# Patient Record
Sex: Male | Born: 1935 | Race: White | Hispanic: No | Marital: Married | Smoking: Never smoker
Health system: Southern US, Community
[De-identification: ages and names within clinical notes are randomized; demographics above are authoritative.]

## PROBLEM LIST (undated history)

## (undated) DIAGNOSIS — I4891 Unspecified atrial fibrillation: Secondary | ICD-10-CM

## (undated) DIAGNOSIS — E039 Hypothyroidism, unspecified: Secondary | ICD-10-CM

## (undated) DIAGNOSIS — N184 Chronic kidney disease, stage 4 (severe): Secondary | ICD-10-CM

## (undated) DIAGNOSIS — I5042 Chronic combined systolic (congestive) and diastolic (congestive) heart failure: Secondary | ICD-10-CM

## (undated) DIAGNOSIS — K219 Gastro-esophageal reflux disease without esophagitis: Secondary | ICD-10-CM

## (undated) DIAGNOSIS — I639 Cerebral infarction, unspecified: Secondary | ICD-10-CM

## (undated) DIAGNOSIS — I255 Ischemic cardiomyopathy: Secondary | ICD-10-CM

## (undated) DIAGNOSIS — C4490 Unspecified malignant neoplasm of skin, unspecified: Secondary | ICD-10-CM

## (undated) DIAGNOSIS — I2510 Atherosclerotic heart disease of native coronary artery without angina pectoris: Secondary | ICD-10-CM

## (undated) DIAGNOSIS — I10 Essential (primary) hypertension: Secondary | ICD-10-CM

## (undated) DIAGNOSIS — M62838 Other muscle spasm: Secondary | ICD-10-CM

## (undated) DIAGNOSIS — D649 Anemia, unspecified: Secondary | ICD-10-CM

## (undated) HISTORY — DX: Cerebral infarction, unspecified: I63.9

## (undated) HISTORY — DX: Other muscle spasm: M62.838

## (undated) HISTORY — DX: Unspecified atrial fibrillation: I48.91

## (undated) HISTORY — DX: Unspecified malignant neoplasm of skin, unspecified: C44.90

## (undated) HISTORY — PX: CORONARY ANGIOPLASTY WITH STENT PLACEMENT: SHX49

## (undated) HISTORY — DX: Gastro-esophageal reflux disease without esophagitis: K21.9

## (undated) HISTORY — DX: Atherosclerotic heart disease of native coronary artery without angina pectoris: I25.10

## (undated) HISTORY — DX: Hypothyroidism, unspecified: E03.9

## (undated) HISTORY — DX: Essential (primary) hypertension: I10

## (undated) HISTORY — DX: Chronic combined systolic (congestive) and diastolic (congestive) heart failure: I50.42

## (undated) HISTORY — DX: Anemia, unspecified: D64.9

---

## 1997-12-09 ENCOUNTER — Other Ambulatory Visit: Admission: RE | Admit: 1997-12-09 | Discharge: 1997-12-09 | Payer: Self-pay | Admitting: Hematology and Oncology

## 1998-03-09 ENCOUNTER — Ambulatory Visit (HOSPITAL_COMMUNITY): Admission: RE | Admit: 1998-03-09 | Discharge: 1998-03-09 | Payer: Self-pay | Admitting: *Deleted

## 1999-03-28 ENCOUNTER — Emergency Department (HOSPITAL_COMMUNITY): Admission: EM | Admit: 1999-03-28 | Discharge: 1999-03-28 | Payer: Self-pay | Admitting: Emergency Medicine

## 1999-05-22 ENCOUNTER — Encounter: Payer: Self-pay | Admitting: Emergency Medicine

## 1999-05-22 ENCOUNTER — Emergency Department (HOSPITAL_COMMUNITY): Admission: EM | Admit: 1999-05-22 | Discharge: 1999-05-22 | Payer: Self-pay | Admitting: Emergency Medicine

## 2000-08-05 ENCOUNTER — Ambulatory Visit: Admission: RE | Admit: 2000-08-05 | Discharge: 2000-08-05 | Payer: Self-pay | Admitting: *Deleted

## 2000-08-05 ENCOUNTER — Encounter: Payer: Self-pay | Admitting: *Deleted

## 2000-08-09 ENCOUNTER — Encounter: Payer: Self-pay | Admitting: *Deleted

## 2000-08-09 ENCOUNTER — Ambulatory Visit (HOSPITAL_COMMUNITY): Admission: RE | Admit: 2000-08-09 | Discharge: 2000-08-09 | Payer: Self-pay | Admitting: *Deleted

## 2002-09-07 ENCOUNTER — Encounter: Admission: RE | Admit: 2002-09-07 | Discharge: 2002-09-07 | Payer: Self-pay

## 2002-09-15 ENCOUNTER — Encounter: Payer: Self-pay | Admitting: General Surgery

## 2002-09-16 ENCOUNTER — Observation Stay (HOSPITAL_COMMUNITY): Admission: RE | Admit: 2002-09-16 | Discharge: 2002-09-17 | Payer: Self-pay | Admitting: General Surgery

## 2002-09-16 ENCOUNTER — Encounter: Payer: Self-pay | Admitting: General Surgery

## 2002-09-16 ENCOUNTER — Encounter (INDEPENDENT_AMBULATORY_CARE_PROVIDER_SITE_OTHER): Payer: Self-pay | Admitting: Specialist

## 2003-11-19 ENCOUNTER — Emergency Department (HOSPITAL_COMMUNITY): Admission: AD | Admit: 2003-11-19 | Discharge: 2003-11-19 | Payer: Self-pay | Admitting: Family Medicine

## 2004-08-15 ENCOUNTER — Emergency Department (HOSPITAL_COMMUNITY): Admission: EM | Admit: 2004-08-15 | Discharge: 2004-08-15 | Payer: Self-pay | Admitting: *Deleted

## 2004-08-25 ENCOUNTER — Ambulatory Visit (HOSPITAL_COMMUNITY): Admission: RE | Admit: 2004-08-25 | Discharge: 2004-08-25 | Payer: Self-pay | Admitting: *Deleted

## 2004-08-25 ENCOUNTER — Encounter (INDEPENDENT_AMBULATORY_CARE_PROVIDER_SITE_OTHER): Payer: Self-pay | Admitting: *Deleted

## 2004-08-29 ENCOUNTER — Encounter: Admission: RE | Admit: 2004-08-29 | Discharge: 2004-08-29 | Payer: Self-pay | Admitting: *Deleted

## 2004-09-11 ENCOUNTER — Ambulatory Visit (HOSPITAL_COMMUNITY): Admission: RE | Admit: 2004-09-11 | Discharge: 2004-09-11 | Payer: Self-pay | Admitting: Cardiology

## 2004-10-01 ENCOUNTER — Emergency Department (HOSPITAL_COMMUNITY): Admission: EM | Admit: 2004-10-01 | Discharge: 2004-10-01 | Payer: Self-pay | Admitting: Emergency Medicine

## 2005-08-27 HISTORY — PX: CORONARY ARTERY BYPASS GRAFT: SHX141

## 2006-01-28 ENCOUNTER — Encounter (HOSPITAL_COMMUNITY): Admission: RE | Admit: 2006-01-28 | Discharge: 2006-04-28 | Payer: Self-pay | Admitting: Cardiology

## 2006-02-14 ENCOUNTER — Ambulatory Visit: Payer: Self-pay | Admitting: Family Medicine

## 2006-03-13 ENCOUNTER — Ambulatory Visit: Payer: Self-pay | Admitting: Family Medicine

## 2006-03-28 ENCOUNTER — Ambulatory Visit: Payer: Self-pay | Admitting: *Deleted

## 2006-03-28 ENCOUNTER — Inpatient Hospital Stay (HOSPITAL_COMMUNITY): Admission: EM | Admit: 2006-03-28 | Discharge: 2006-04-05 | Payer: Self-pay | Admitting: Emergency Medicine

## 2006-03-29 ENCOUNTER — Encounter: Payer: Self-pay | Admitting: Cardiology

## 2006-04-15 ENCOUNTER — Ambulatory Visit: Payer: Self-pay | Admitting: Cardiology

## 2006-05-03 ENCOUNTER — Ambulatory Visit: Payer: Self-pay | Admitting: Cardiology

## 2006-05-13 ENCOUNTER — Ambulatory Visit: Payer: Self-pay | Admitting: Cardiology

## 2006-05-27 ENCOUNTER — Ambulatory Visit: Payer: Self-pay | Admitting: Cardiology

## 2006-06-06 ENCOUNTER — Encounter (HOSPITAL_COMMUNITY): Admission: RE | Admit: 2006-06-06 | Discharge: 2006-09-04 | Payer: Self-pay | Admitting: Cardiology

## 2006-06-20 ENCOUNTER — Ambulatory Visit: Payer: Self-pay | Admitting: Family Medicine

## 2006-09-05 ENCOUNTER — Encounter (HOSPITAL_COMMUNITY): Admission: RE | Admit: 2006-09-05 | Discharge: 2006-09-26 | Payer: Self-pay | Admitting: Cardiology

## 2006-10-03 ENCOUNTER — Inpatient Hospital Stay (HOSPITAL_COMMUNITY): Admission: EM | Admit: 2006-10-03 | Discharge: 2006-10-06 | Payer: Self-pay | Admitting: Emergency Medicine

## 2006-10-03 ENCOUNTER — Ambulatory Visit: Payer: Self-pay | Admitting: Internal Medicine

## 2006-10-04 ENCOUNTER — Encounter: Payer: Self-pay | Admitting: Internal Medicine

## 2006-10-08 ENCOUNTER — Inpatient Hospital Stay (HOSPITAL_COMMUNITY): Admission: EM | Admit: 2006-10-08 | Discharge: 2006-10-09 | Payer: Self-pay | Admitting: Emergency Medicine

## 2006-10-14 ENCOUNTER — Ambulatory Visit: Payer: Self-pay | Admitting: Cardiology

## 2006-10-17 ENCOUNTER — Ambulatory Visit: Payer: Self-pay | Admitting: Family Medicine

## 2006-10-28 ENCOUNTER — Ambulatory Visit: Payer: Self-pay | Admitting: Family Medicine

## 2006-11-26 ENCOUNTER — Ambulatory Visit: Payer: Self-pay | Admitting: Family Medicine

## 2006-12-12 ENCOUNTER — Ambulatory Visit: Payer: Self-pay | Admitting: Cardiology

## 2006-12-31 ENCOUNTER — Ambulatory Visit: Payer: Self-pay | Admitting: Family Medicine

## 2007-01-06 ENCOUNTER — Inpatient Hospital Stay (HOSPITAL_COMMUNITY): Admission: EM | Admit: 2007-01-06 | Discharge: 2007-01-07 | Payer: Self-pay | Admitting: *Deleted

## 2007-01-06 ENCOUNTER — Ambulatory Visit: Payer: Self-pay | Admitting: Cardiology

## 2007-01-13 ENCOUNTER — Ambulatory Visit: Payer: Self-pay

## 2007-01-14 LAB — CONVERTED CEMR LAB
HDL: 29.3 mg/dL — ABNORMAL LOW (ref >39.0)
Total CHOL/HDL Ratio: 3.2
Triglycerides: 51 mg/dL (ref 0–149)

## 2007-01-16 ENCOUNTER — Ambulatory Visit: Payer: Self-pay | Admitting: Cardiology

## 2007-01-28 ENCOUNTER — Ambulatory Visit: Payer: Self-pay | Admitting: Family Medicine

## 2007-02-03 ENCOUNTER — Ambulatory Visit: Payer: Self-pay

## 2007-02-03 ENCOUNTER — Ambulatory Visit: Payer: Self-pay | Admitting: Internal Medicine

## 2007-02-03 ENCOUNTER — Encounter: Payer: Self-pay | Admitting: Cardiology

## 2007-02-25 ENCOUNTER — Ambulatory Visit: Payer: Self-pay | Admitting: Cardiology

## 2007-03-06 ENCOUNTER — Ambulatory Visit: Payer: Self-pay | Admitting: Family Medicine

## 2007-03-17 ENCOUNTER — Encounter (HOSPITAL_COMMUNITY): Admission: RE | Admit: 2007-03-17 | Discharge: 2007-06-02 | Payer: Self-pay | Admitting: Family Medicine

## 2007-03-20 ENCOUNTER — Ambulatory Visit: Payer: Self-pay | Admitting: Family Medicine

## 2007-03-27 ENCOUNTER — Ambulatory Visit: Payer: Self-pay | Admitting: Cardiology

## 2007-03-31 ENCOUNTER — Ambulatory Visit: Payer: Self-pay | Admitting: Family Medicine

## 2007-04-09 ENCOUNTER — Ambulatory Visit: Payer: Self-pay | Admitting: Family Medicine

## 2007-05-05 ENCOUNTER — Ambulatory Visit: Payer: Self-pay | Admitting: Cardiology

## 2007-05-06 ENCOUNTER — Encounter: Admission: RE | Admit: 2007-05-06 | Discharge: 2007-05-06 | Payer: Self-pay | Admitting: Nephrology

## 2007-06-12 ENCOUNTER — Ambulatory Visit: Payer: Self-pay | Admitting: Family Medicine

## 2007-06-17 ENCOUNTER — Ambulatory Visit: Payer: Self-pay | Admitting: Internal Medicine

## 2007-07-17 ENCOUNTER — Ambulatory Visit: Payer: Self-pay | Admitting: Cardiology

## 2007-07-22 ENCOUNTER — Ambulatory Visit: Payer: Self-pay | Admitting: Family Medicine

## 2007-07-29 ENCOUNTER — Ambulatory Visit: Payer: Self-pay | Admitting: Pulmonary Disease

## 2007-07-30 ENCOUNTER — Ambulatory Visit (HOSPITAL_BASED_OUTPATIENT_CLINIC_OR_DEPARTMENT_OTHER): Admission: RE | Admit: 2007-07-30 | Discharge: 2007-07-30 | Payer: Self-pay | Admitting: Pulmonary Disease

## 2007-08-14 ENCOUNTER — Ambulatory Visit: Payer: Self-pay | Admitting: Pulmonary Disease

## 2007-08-14 ENCOUNTER — Telehealth (INDEPENDENT_AMBULATORY_CARE_PROVIDER_SITE_OTHER): Payer: Self-pay | Admitting: *Deleted

## 2007-08-19 ENCOUNTER — Ambulatory Visit: Payer: Self-pay | Admitting: Cardiology

## 2007-08-19 LAB — CONVERTED CEMR LAB
CO2: 26 meq/L (ref 19–32)
Chloride: 111 meq/L (ref 96–112)
Creatinine, Ser: 1.8 mg/dL — ABNORMAL HIGH (ref 0.4–1.5)
Glucose, Bld: 129 mg/dL — ABNORMAL HIGH (ref 70–99)
Sodium: 142 meq/L (ref 135–145)

## 2007-08-23 ENCOUNTER — Ambulatory Visit: Payer: Self-pay | Admitting: *Deleted

## 2007-08-23 ENCOUNTER — Inpatient Hospital Stay (HOSPITAL_COMMUNITY): Admission: EM | Admit: 2007-08-23 | Discharge: 2007-08-26 | Payer: Self-pay | Admitting: Emergency Medicine

## 2007-09-04 ENCOUNTER — Ambulatory Visit: Payer: Self-pay | Admitting: Cardiology

## 2007-09-04 LAB — CONVERTED CEMR LAB
CO2: 27 meq/L (ref 19–32)
Chloride: 108 meq/L (ref 96–112)
Creatinine, Ser: 2.1 mg/dL — ABNORMAL HIGH (ref 0.4–1.5)
Glucose, Bld: 132 mg/dL — ABNORMAL HIGH (ref 70–99)
Sodium: 139 meq/L (ref 135–145)

## 2007-09-09 ENCOUNTER — Ambulatory Visit: Payer: Self-pay | Admitting: Cardiology

## 2007-09-09 LAB — CONVERTED CEMR LAB
BUN: 36 mg/dL — ABNORMAL HIGH (ref 6–23)
CO2: 30 meq/L (ref 19–32)
Creatinine, Ser: 1.9 mg/dL — ABNORMAL HIGH (ref 0.4–1.5)
Glucose, Bld: 148 mg/dL — ABNORMAL HIGH (ref 70–99)
Potassium: 4.9 meq/L (ref 3.5–5.1)
Sodium: 141 meq/L (ref 135–145)

## 2007-10-02 ENCOUNTER — Encounter (HOSPITAL_COMMUNITY): Admission: RE | Admit: 2007-10-02 | Discharge: 2007-12-31 | Payer: Self-pay | Admitting: Cardiology

## 2007-10-13 DIAGNOSIS — Z85820 Personal history of malignant melanoma of skin: Secondary | ICD-10-CM

## 2007-10-13 DIAGNOSIS — G4730 Sleep apnea, unspecified: Secondary | ICD-10-CM | POA: Insufficient documentation

## 2007-10-13 DIAGNOSIS — I10 Essential (primary) hypertension: Secondary | ICD-10-CM | POA: Insufficient documentation

## 2007-10-13 DIAGNOSIS — Z8679 Personal history of other diseases of the circulatory system: Secondary | ICD-10-CM | POA: Insufficient documentation

## 2007-10-14 ENCOUNTER — Ambulatory Visit: Payer: Self-pay | Admitting: Pulmonary Disease

## 2007-10-14 DIAGNOSIS — G479 Sleep disorder, unspecified: Secondary | ICD-10-CM | POA: Insufficient documentation

## 2007-10-17 ENCOUNTER — Ambulatory Visit: Payer: Self-pay | Admitting: Cardiology

## 2007-11-13 ENCOUNTER — Ambulatory Visit: Payer: Self-pay | Admitting: Cardiology

## 2007-12-09 ENCOUNTER — Ambulatory Visit: Payer: Self-pay | Admitting: Family Medicine

## 2008-01-01 ENCOUNTER — Encounter (HOSPITAL_COMMUNITY): Admission: RE | Admit: 2008-01-01 | Discharge: 2008-01-25 | Payer: Self-pay | Admitting: Cardiology

## 2008-01-01 ENCOUNTER — Encounter (HOSPITAL_COMMUNITY): Admission: RE | Admit: 2008-01-01 | Discharge: 2008-01-27 | Payer: Self-pay | Admitting: Cardiology

## 2008-03-25 ENCOUNTER — Ambulatory Visit: Payer: Self-pay

## 2008-04-15 ENCOUNTER — Ambulatory Visit: Payer: Self-pay | Admitting: Family Medicine

## 2008-05-25 ENCOUNTER — Ambulatory Visit: Payer: Self-pay | Admitting: Family Medicine

## 2008-08-03 ENCOUNTER — Ambulatory Visit: Payer: Self-pay | Admitting: Family Medicine

## 2008-08-04 ENCOUNTER — Ambulatory Visit: Payer: Self-pay | Admitting: Internal Medicine

## 2008-08-09 ENCOUNTER — Ambulatory Visit: Payer: Self-pay | Admitting: Cardiology

## 2008-08-13 ENCOUNTER — Ambulatory Visit: Payer: Self-pay | Admitting: Cardiovascular Disease

## 2008-08-19 ENCOUNTER — Ambulatory Visit: Payer: Self-pay | Admitting: Internal Medicine

## 2008-09-02 ENCOUNTER — Ambulatory Visit: Payer: Self-pay | Admitting: Cardiology

## 2008-09-02 ENCOUNTER — Ambulatory Visit: Payer: Self-pay | Admitting: Cardiovascular Disease

## 2008-09-06 ENCOUNTER — Ambulatory Visit: Payer: Self-pay | Admitting: Cardiology

## 2008-09-06 LAB — CONVERTED CEMR LAB
BUN: 40 mg/dL — ABNORMAL HIGH (ref 6–23)
GFR calc Af Amer: 39 mL/min
Glucose, Bld: 172 mg/dL — ABNORMAL HIGH (ref 70–99)
Potassium: 4.8 meq/L (ref 3.5–5.1)

## 2008-09-09 ENCOUNTER — Ambulatory Visit: Payer: Self-pay | Admitting: Cardiology

## 2008-09-13 ENCOUNTER — Inpatient Hospital Stay (HOSPITAL_COMMUNITY): Admission: EM | Admit: 2008-09-13 | Discharge: 2008-09-17 | Payer: Self-pay | Admitting: Emergency Medicine

## 2008-09-13 ENCOUNTER — Ambulatory Visit: Payer: Self-pay | Admitting: Cardiology

## 2008-09-14 ENCOUNTER — Encounter: Payer: Self-pay | Admitting: Cardiology

## 2008-09-20 ENCOUNTER — Ambulatory Visit: Payer: Self-pay | Admitting: Cardiology

## 2008-09-29 ENCOUNTER — Ambulatory Visit: Payer: Self-pay | Admitting: Cardiology

## 2008-10-04 ENCOUNTER — Ambulatory Visit: Payer: Self-pay | Admitting: Cardiology

## 2008-10-04 LAB — CONVERTED CEMR LAB
BUN: 41 mg/dL — ABNORMAL HIGH (ref 6–23)
Chloride: 108 meq/L (ref 96–112)
Creatinine, Ser: 2.2 mg/dL — ABNORMAL HIGH (ref 0.4–1.5)
GFR calc non Af Amer: 30 mL/min

## 2008-10-06 ENCOUNTER — Ambulatory Visit: Payer: Self-pay | Admitting: Cardiology

## 2008-10-12 ENCOUNTER — Ambulatory Visit: Payer: Self-pay | Admitting: Cardiology

## 2008-10-19 ENCOUNTER — Ambulatory Visit: Payer: Self-pay | Admitting: Cardiology

## 2008-10-19 ENCOUNTER — Ambulatory Visit (HOSPITAL_COMMUNITY): Admission: RE | Admit: 2008-10-19 | Discharge: 2008-10-19 | Payer: Self-pay | Admitting: Cardiology

## 2008-10-25 ENCOUNTER — Ambulatory Visit: Payer: Self-pay | Admitting: Cardiology

## 2008-10-26 ENCOUNTER — Ambulatory Visit: Payer: Self-pay | Admitting: Cardiology

## 2008-10-26 LAB — CONVERTED CEMR LAB
CO2: 29 meq/L (ref 19–32)
Calcium: 9.1 mg/dL (ref 8.4–10.5)
Creatinine, Ser: 2.2 mg/dL — ABNORMAL HIGH (ref 0.4–1.5)
Glucose, Bld: 186 mg/dL — ABNORMAL HIGH (ref 70–99)

## 2008-10-28 ENCOUNTER — Ambulatory Visit: Payer: Self-pay | Admitting: Cardiology

## 2008-10-28 LAB — CONVERTED CEMR LAB
BUN: 39 mg/dL — ABNORMAL HIGH (ref 6–23)
Calcium: 8.9 mg/dL (ref 8.4–10.5)
Creatinine, Ser: 2.5 mg/dL — ABNORMAL HIGH (ref 0.4–1.5)
GFR calc Af Amer: 32 mL/min
Glucose, Bld: 121 mg/dL — ABNORMAL HIGH (ref 70–99)

## 2008-11-01 ENCOUNTER — Ambulatory Visit: Payer: Self-pay | Admitting: Cardiology

## 2008-11-01 LAB — CONVERTED CEMR LAB
BUN: 45 mg/dL — ABNORMAL HIGH (ref 6–23)
Creatinine, Ser: 2.2 mg/dL — ABNORMAL HIGH (ref 0.4–1.5)
GFR calc Af Amer: 37 mL/min
GFR calc non Af Amer: 30 mL/min

## 2008-11-04 ENCOUNTER — Ambulatory Visit: Payer: Self-pay | Admitting: Cardiology

## 2008-11-10 ENCOUNTER — Ambulatory Visit: Payer: Self-pay | Admitting: Cardiology

## 2008-11-10 LAB — CONVERTED CEMR LAB
Calcium: 9.1 mg/dL (ref 8.4–10.5)
Creatinine, Ser: 2.3 mg/dL — ABNORMAL HIGH (ref 0.4–1.5)

## 2008-11-17 ENCOUNTER — Ambulatory Visit: Payer: Self-pay | Admitting: Cardiology

## 2008-11-17 LAB — CONVERTED CEMR LAB
BUN: 58 mg/dL — ABNORMAL HIGH (ref 6–23)
Calcium: 8.7 mg/dL (ref 8.4–10.5)
GFR calc non Af Amer: 31.93 mL/min (ref >60)
Potassium: 4.4 meq/L (ref 3.5–5.1)
Sodium: 142 meq/L (ref 135–145)

## 2008-11-29 ENCOUNTER — Ambulatory Visit: Payer: Self-pay | Admitting: Cardiology

## 2008-12-06 DIAGNOSIS — N189 Chronic kidney disease, unspecified: Secondary | ICD-10-CM | POA: Insufficient documentation

## 2008-12-06 DIAGNOSIS — I2510 Atherosclerotic heart disease of native coronary artery without angina pectoris: Secondary | ICD-10-CM | POA: Insufficient documentation

## 2008-12-06 DIAGNOSIS — I4891 Unspecified atrial fibrillation: Secondary | ICD-10-CM | POA: Insufficient documentation

## 2008-12-06 DIAGNOSIS — D539 Nutritional anemia, unspecified: Secondary | ICD-10-CM

## 2008-12-07 ENCOUNTER — Ambulatory Visit: Payer: Self-pay | Admitting: Cardiology

## 2008-12-07 ENCOUNTER — Encounter: Payer: Self-pay | Admitting: Cardiology

## 2008-12-07 DIAGNOSIS — I2589 Other forms of chronic ischemic heart disease: Secondary | ICD-10-CM | POA: Insufficient documentation

## 2008-12-09 LAB — CONVERTED CEMR LAB
CO2: 33 meq/L — ABNORMAL HIGH (ref 19–32)
Calcium: 9.1 mg/dL (ref 8.4–10.5)
Chloride: 106 meq/L (ref 96–112)
Sodium: 144 meq/L (ref 135–145)

## 2008-12-20 ENCOUNTER — Telehealth: Payer: Self-pay | Admitting: Cardiology

## 2008-12-22 ENCOUNTER — Telehealth (INDEPENDENT_AMBULATORY_CARE_PROVIDER_SITE_OTHER): Payer: Self-pay | Admitting: Cardiology

## 2008-12-22 ENCOUNTER — Ambulatory Visit: Payer: Self-pay | Admitting: Cardiology

## 2008-12-22 ENCOUNTER — Telehealth: Payer: Self-pay | Admitting: Cardiology

## 2008-12-22 DIAGNOSIS — I5022 Chronic systolic (congestive) heart failure: Secondary | ICD-10-CM | POA: Insufficient documentation

## 2008-12-23 LAB — CONVERTED CEMR LAB
BUN: 63 mg/dL — ABNORMAL HIGH (ref 6–23)
Chloride: 107 meq/L (ref 96–112)
Potassium: 4.3 meq/L (ref 3.5–5.1)
Sodium: 143 meq/L (ref 135–145)

## 2008-12-27 ENCOUNTER — Ambulatory Visit: Payer: Self-pay | Admitting: Cardiology

## 2008-12-28 ENCOUNTER — Telehealth: Payer: Self-pay | Admitting: Cardiology

## 2009-01-05 ENCOUNTER — Ambulatory Visit: Payer: Self-pay | Admitting: Cardiology

## 2009-01-06 LAB — CONVERTED CEMR LAB
CO2: 32 meq/L (ref 19–32)
Calcium: 9.2 mg/dL (ref 8.4–10.5)
Chloride: 101 meq/L (ref 96–112)
Glucose, Bld: 113 mg/dL — ABNORMAL HIGH (ref 70–99)
Sodium: 140 meq/L (ref 135–145)

## 2009-01-17 ENCOUNTER — Telehealth: Payer: Self-pay | Admitting: Cardiology

## 2009-01-18 ENCOUNTER — Telehealth: Payer: Self-pay | Admitting: Cardiology

## 2009-01-19 ENCOUNTER — Ambulatory Visit: Payer: Self-pay | Admitting: Cardiology

## 2009-01-25 ENCOUNTER — Encounter: Payer: Self-pay | Admitting: Cardiology

## 2009-01-25 ENCOUNTER — Encounter: Payer: Self-pay | Admitting: *Deleted

## 2009-01-26 ENCOUNTER — Ambulatory Visit: Payer: Self-pay | Admitting: Cardiology

## 2009-01-26 ENCOUNTER — Encounter: Payer: Self-pay | Admitting: Cardiology

## 2009-01-26 LAB — CONVERTED CEMR LAB: Protime: 17.5

## 2009-02-09 ENCOUNTER — Encounter (INDEPENDENT_AMBULATORY_CARE_PROVIDER_SITE_OTHER): Payer: Self-pay | Admitting: Cardiology

## 2009-02-09 ENCOUNTER — Ambulatory Visit: Payer: Self-pay | Admitting: Cardiology

## 2009-02-09 LAB — CONVERTED CEMR LAB: Protime: 18.7

## 2009-02-15 ENCOUNTER — Ambulatory Visit: Payer: Self-pay | Admitting: Cardiology

## 2009-02-23 ENCOUNTER — Ambulatory Visit: Payer: Self-pay | Admitting: Cardiology

## 2009-03-01 LAB — CONVERTED CEMR LAB
CO2: 31 meq/L (ref 19–32)
Chloride: 104 meq/L (ref 96–112)
Glucose, Bld: 173 mg/dL — ABNORMAL HIGH (ref 70–99)
Potassium: 4.7 meq/L (ref 3.5–5.1)
Sodium: 141 meq/L (ref 135–145)

## 2009-03-02 ENCOUNTER — Encounter: Payer: Self-pay | Admitting: *Deleted

## 2009-03-04 ENCOUNTER — Telehealth (INDEPENDENT_AMBULATORY_CARE_PROVIDER_SITE_OTHER): Payer: Self-pay | Admitting: *Deleted

## 2009-03-09 ENCOUNTER — Ambulatory Visit: Payer: Self-pay | Admitting: Internal Medicine

## 2009-03-09 LAB — CONVERTED CEMR LAB: Prothrombin Time: 16.5 s

## 2009-03-21 ENCOUNTER — Telehealth: Payer: Self-pay | Admitting: Cardiology

## 2009-03-25 ENCOUNTER — Emergency Department (HOSPITAL_COMMUNITY): Admission: EM | Admit: 2009-03-25 | Discharge: 2009-03-26 | Payer: Self-pay | Admitting: Emergency Medicine

## 2009-04-06 ENCOUNTER — Ambulatory Visit: Payer: Self-pay | Admitting: Internal Medicine

## 2009-04-06 LAB — CONVERTED CEMR LAB: POC INR: 4

## 2009-04-20 ENCOUNTER — Ambulatory Visit: Payer: Self-pay | Admitting: Internal Medicine

## 2009-04-20 LAB — CONVERTED CEMR LAB: POC INR: 3

## 2009-05-03 ENCOUNTER — Encounter: Payer: Self-pay | Admitting: Cardiology

## 2009-05-11 ENCOUNTER — Ambulatory Visit: Payer: Self-pay | Admitting: Internal Medicine

## 2009-05-17 ENCOUNTER — Ambulatory Visit: Payer: Self-pay | Admitting: Cardiology

## 2009-05-20 ENCOUNTER — Telehealth: Payer: Self-pay | Admitting: Cardiology

## 2009-05-25 ENCOUNTER — Ambulatory Visit: Payer: Self-pay | Admitting: Cardiovascular Disease

## 2009-05-25 LAB — CONVERTED CEMR LAB: POC INR: 2.1

## 2009-05-27 ENCOUNTER — Telehealth: Payer: Self-pay | Admitting: Cardiology

## 2009-06-06 ENCOUNTER — Ambulatory Visit: Payer: Self-pay | Admitting: Family Medicine

## 2009-06-07 ENCOUNTER — Ambulatory Visit (HOSPITAL_COMMUNITY): Admission: RE | Admit: 2009-06-07 | Discharge: 2009-06-07 | Payer: Self-pay | Admitting: Family Medicine

## 2009-06-07 ENCOUNTER — Ambulatory Visit: Payer: Self-pay | Admitting: Surgery

## 2009-06-07 ENCOUNTER — Encounter: Payer: Self-pay | Admitting: Family Medicine

## 2009-06-07 ENCOUNTER — Telehealth: Payer: Self-pay | Admitting: Cardiology

## 2009-06-08 ENCOUNTER — Ambulatory Visit: Payer: Self-pay | Admitting: Internal Medicine

## 2009-06-16 ENCOUNTER — Ambulatory Visit: Payer: Self-pay | Admitting: Family Medicine

## 2009-06-17 ENCOUNTER — Ambulatory Visit: Payer: Self-pay | Admitting: Cardiology

## 2009-06-17 LAB — CONVERTED CEMR LAB: POC INR: 2.4

## 2009-06-20 LAB — CONVERTED CEMR LAB
CO2: 31 meq/L (ref 19–32)
Calcium: 8.6 mg/dL (ref 8.4–10.5)
Creatinine, Ser: 2.7 mg/dL — ABNORMAL HIGH (ref 0.4–1.5)
GFR calc non Af Amer: 23.86 mL/min (ref >60)
Glucose, Bld: 151 mg/dL — ABNORMAL HIGH (ref 70–99)
Sodium: 141 meq/L (ref 135–145)

## 2009-06-21 ENCOUNTER — Emergency Department (HOSPITAL_COMMUNITY): Admission: EM | Admit: 2009-06-21 | Discharge: 2009-06-22 | Payer: Self-pay | Admitting: Emergency Medicine

## 2009-06-23 ENCOUNTER — Encounter (INDEPENDENT_AMBULATORY_CARE_PROVIDER_SITE_OTHER): Payer: Self-pay | Admitting: *Deleted

## 2009-06-27 ENCOUNTER — Telehealth (INDEPENDENT_AMBULATORY_CARE_PROVIDER_SITE_OTHER): Payer: Self-pay | Admitting: Physician Assistant

## 2009-07-01 ENCOUNTER — Ambulatory Visit: Payer: Self-pay | Admitting: Cardiovascular Disease

## 2009-07-18 ENCOUNTER — Telehealth (INDEPENDENT_AMBULATORY_CARE_PROVIDER_SITE_OTHER): Payer: Self-pay | Admitting: Nurse Practitioner

## 2009-07-22 ENCOUNTER — Ambulatory Visit: Payer: Self-pay | Admitting: Internal Medicine

## 2009-07-22 LAB — CONVERTED CEMR LAB: POC INR: 3.6

## 2009-08-09 ENCOUNTER — Encounter: Payer: Self-pay | Admitting: Cardiology

## 2009-08-15 ENCOUNTER — Ambulatory Visit: Payer: Self-pay | Admitting: Cardiology

## 2009-08-15 LAB — CONVERTED CEMR LAB: POC INR: 2.2

## 2009-09-12 ENCOUNTER — Ambulatory Visit: Payer: Self-pay | Admitting: Cardiology

## 2009-09-19 ENCOUNTER — Ambulatory Visit: Payer: Self-pay | Admitting: Cardiology

## 2009-09-26 ENCOUNTER — Telehealth: Payer: Self-pay | Admitting: Cardiology

## 2009-10-10 ENCOUNTER — Encounter (INDEPENDENT_AMBULATORY_CARE_PROVIDER_SITE_OTHER): Payer: Self-pay | Admitting: Cardiology

## 2009-10-10 ENCOUNTER — Ambulatory Visit: Payer: Self-pay | Admitting: Cardiology

## 2009-10-10 LAB — CONVERTED CEMR LAB: POC INR: 2.7

## 2009-10-13 ENCOUNTER — Telehealth (INDEPENDENT_AMBULATORY_CARE_PROVIDER_SITE_OTHER): Payer: Self-pay | Admitting: *Deleted

## 2009-10-21 ENCOUNTER — Encounter: Payer: Self-pay | Admitting: Cardiology

## 2009-11-07 ENCOUNTER — Ambulatory Visit: Payer: Self-pay | Admitting: Internal Medicine

## 2009-11-15 ENCOUNTER — Ambulatory Visit: Payer: Self-pay | Admitting: Cardiology

## 2009-11-15 ENCOUNTER — Ambulatory Visit: Payer: Self-pay | Admitting: Family Medicine

## 2009-11-15 ENCOUNTER — Telehealth: Payer: Self-pay | Admitting: Cardiology

## 2009-11-17 ENCOUNTER — Telehealth: Payer: Self-pay | Admitting: Cardiology

## 2009-12-05 ENCOUNTER — Ambulatory Visit: Payer: Self-pay | Admitting: Cardiovascular Disease

## 2009-12-12 ENCOUNTER — Ambulatory Visit: Payer: Self-pay | Admitting: Cardiology

## 2009-12-16 ENCOUNTER — Encounter: Payer: Self-pay | Admitting: Cardiology

## 2009-12-16 LAB — CONVERTED CEMR LAB
BUN: 47 mg/dL — ABNORMAL HIGH (ref 6–23)
Basophils Absolute: 0 10*3/uL (ref 0.0–0.1)
CO2: 31 meq/L (ref 19–32)
Calcium, Total (PTH): 9.7 mg/dL (ref 8.4–10.5)
Eosinophils Absolute: 0.2 10*3/uL (ref 0.0–0.7)
Ferritin: 207.3 ng/mL (ref 22.0–322.0)
Glucose, Bld: 95 mg/dL (ref 70–99)
HCT: 32.9 % — ABNORMAL LOW (ref 39.0–52.0)
Hemoglobin: 11.3 g/dL — ABNORMAL LOW (ref 13.0–17.0)
Lymphs Abs: 1.7 10*3/uL (ref 0.7–4.0)
MCHC: 34.2 g/dL (ref 30.0–36.0)
MCV: 93.8 fL (ref 78.0–100.0)
Monocytes Absolute: 0.3 10*3/uL (ref 0.1–1.0)
Neutro Abs: 4.6 10*3/uL (ref 1.4–7.7)
PTH: 126.6 pg/mL — ABNORMAL HIGH (ref 14.0–72.0)
Platelets: 122 10*3/uL — ABNORMAL LOW (ref 150.0–400.0)
Potassium: 4.2 meq/L (ref 3.5–5.1)
RDW: 14.1 % (ref 11.5–14.6)
Saturation Ratios: 19.8 % — ABNORMAL LOW (ref 20.0–50.0)
Sodium: 143 meq/L (ref 135–145)

## 2009-12-19 ENCOUNTER — Ambulatory Visit: Payer: Self-pay | Admitting: Cardiovascular Disease

## 2009-12-19 LAB — CONVERTED CEMR LAB: POC INR: 3.1

## 2010-01-02 ENCOUNTER — Ambulatory Visit: Payer: Self-pay | Admitting: Cardiology

## 2010-01-16 ENCOUNTER — Ambulatory Visit: Payer: Self-pay | Admitting: Cardiology

## 2010-01-16 LAB — CONVERTED CEMR LAB: POC INR: 2.5

## 2010-01-17 ENCOUNTER — Ambulatory Visit: Payer: Self-pay | Admitting: Family Medicine

## 2010-02-06 ENCOUNTER — Ambulatory Visit: Payer: Self-pay | Admitting: Internal Medicine

## 2010-02-06 LAB — CONVERTED CEMR LAB: POC INR: 1.7

## 2010-02-20 ENCOUNTER — Ambulatory Visit: Payer: Self-pay | Admitting: Cardiology

## 2010-03-15 ENCOUNTER — Ambulatory Visit: Payer: Self-pay | Admitting: Cardiology

## 2010-03-24 ENCOUNTER — Telehealth: Payer: Self-pay | Admitting: Cardiology

## 2010-03-27 ENCOUNTER — Ambulatory Visit: Payer: Self-pay | Admitting: Physician Assistant

## 2010-04-10 ENCOUNTER — Encounter: Payer: Self-pay | Admitting: Cardiology

## 2010-04-12 ENCOUNTER — Encounter: Payer: Self-pay | Admitting: Cardiology

## 2010-04-12 ENCOUNTER — Ambulatory Visit: Payer: Self-pay | Admitting: Cardiology

## 2010-04-12 LAB — CONVERTED CEMR LAB: POC INR: 2.9

## 2010-04-18 ENCOUNTER — Ambulatory Visit: Payer: Self-pay | Admitting: Family Medicine

## 2010-04-26 ENCOUNTER — Telehealth: Payer: Self-pay | Admitting: Cardiology

## 2010-05-10 ENCOUNTER — Ambulatory Visit: Payer: Self-pay | Admitting: Internal Medicine

## 2010-06-07 ENCOUNTER — Ambulatory Visit: Payer: Self-pay | Admitting: Cardiology

## 2010-06-23 ENCOUNTER — Encounter: Payer: Self-pay | Admitting: Cardiology

## 2010-07-03 ENCOUNTER — Ambulatory Visit: Payer: Self-pay | Admitting: Family Medicine

## 2010-07-11 ENCOUNTER — Ambulatory Visit: Payer: Self-pay | Admitting: Cardiology

## 2010-07-11 ENCOUNTER — Ambulatory Visit: Payer: Self-pay | Admitting: Cardiovascular Disease

## 2010-07-11 LAB — CONVERTED CEMR LAB: POC INR: 3.3

## 2010-08-03 ENCOUNTER — Ambulatory Visit: Payer: Self-pay | Admitting: Internal Medicine

## 2010-08-24 ENCOUNTER — Ambulatory Visit: Payer: Self-pay | Admitting: Cardiology

## 2010-08-24 LAB — CONVERTED CEMR LAB: POC INR: 1.4

## 2010-09-07 ENCOUNTER — Ambulatory Visit: Admission: RE | Admit: 2010-09-07 | Discharge: 2010-09-07 | Payer: Self-pay | Source: Home / Self Care

## 2010-09-07 LAB — CONVERTED CEMR LAB: POC INR: 2.6

## 2010-09-16 ENCOUNTER — Encounter: Payer: Self-pay | Admitting: Cardiology

## 2010-09-17 ENCOUNTER — Encounter: Payer: Self-pay | Admitting: Family Medicine

## 2010-09-24 LAB — CONVERTED CEMR LAB
BUN: 51 mg/dL — ABNORMAL HIGH (ref 6–23)
BUN: 63 mg/dL — ABNORMAL HIGH (ref 6–23)
CO2: 30 meq/L (ref 19–32)
CO2: 32 meq/L (ref 19–32)
Calcium: 8.9 mg/dL (ref 8.4–10.5)
Chloride: 102 meq/L (ref 96–112)
Chloride: 104 meq/L (ref 96–112)
Chloride: 105 meq/L (ref 96–112)
Creatinine, Ser: 2.3 mg/dL — ABNORMAL HIGH (ref 0.4–1.5)
Creatinine, Ser: 2.6 mg/dL — ABNORMAL HIGH (ref 0.4–1.5)
Creatinine, Ser: 2.8 mg/dL — ABNORMAL HIGH (ref 0.4–1.5)
GFR calc non Af Amer: 22.89 mL/min (ref >60)
Glucose, Bld: 109 mg/dL — ABNORMAL HIGH (ref 70–99)
Glucose, Bld: 122 mg/dL — ABNORMAL HIGH (ref 70–99)
Glucose, Bld: 97 mg/dL (ref 70–99)
Potassium: 3.9 meq/L (ref 3.5–5.1)
Potassium: 4.3 meq/L (ref 3.5–5.1)
Potassium: 4.5 meq/L (ref 3.5–5.1)
Sodium: 141 meq/L (ref 135–145)

## 2010-09-25 ENCOUNTER — Ambulatory Visit: Admission: RE | Admit: 2010-09-25 | Discharge: 2010-09-25 | Payer: Self-pay | Source: Home / Self Care

## 2010-09-25 LAB — CONVERTED CEMR LAB: POC INR: 2.2

## 2010-09-28 NOTE — Medication Information (Signed)
Summary: rov/sp  Anticoagulant Therapy  Managed by: Weston Brass, PharmD Referring MD: Rollene Rotunda MD PCP: Sharlot Gowda, MD Supervising MD: Antoine Poche MD, Fayrene Fearing Indication 1: Atrial Fibrillation (ICD-427.31) Lab Used: [facility] Site: [facility] INR POC 3.3 INR RANGE 2 - 3  Dietary changes: no    Health status changes: no    Bleeding/hemorrhagic complications: no    Recent/future hospitalizations: no    Any changes in medication regimen? no    Recent/future dental: no  Any missed doses?: no       Is patient compliant with meds? yes       Allergies: 1)  ! Ace Inhibitors  Anticoagulation Management History:      The patient is taking warfarin and comes in today for a routine follow up visit.  Positive risk factors for bleeding include an age of 75 years or older and presence of serious comorbidities.  The bleeding index is 'intermediate risk'.  Positive CHADS2 values include History of CHF, History of HTN, and Age > 50 years old.  The start date was 08/04/2008.  His last INR was 2.4.  Anticoagulation responsible provider: Antoine Poche MD, Fayrene Fearing.  INR POC: 3.3.  Cuvette Lot#: [id number].  Exp: 09/2011.    Anticoagulation Management Assessment/Plan:      The patient's current anticoagulation dose is Warfarin sodium 2.5 mg tabs: Use as directed by Anticoagualtion Clinic.  The target INR is 2 - 3.  The next INR is due 08/24/2010.  Anticoagulation instructions were given to patient/spouse.  Results were reviewed/authorized by Weston Brass, PharmD.  He was notified by Weston Brass PharmD.         Prior Anticoagulation Instructions: INR 3.3  Skip today's dose of Coumadin then resume same dose of 1 tablet every day except 2 tablets on Monday and Friday.  Recheck INR in 3 weeks.   Current Anticoagulation Instructions: INR 3.3  Decrease dose to 1 tablet every day except 2 tablets on Monday.  Recheck INR in 3 weeks.

## 2010-09-28 NOTE — Medication Information (Signed)
Summary: [name]  Anticoagulant Therapy  Managed by: Bethanne Ginger, PharmD Referring MD: Rollene Rotunda MD PCP: Sharlot Gowda, MD Supervising MD: Jens Som MD, Arlys John Indication 1: Atrial Fibrillation (ICD-427.31) Lab Used: [facility] Site: [facility] INR POC 2.5 INR RANGE 2 - 3  Dietary changes: no    Health status changes: no    Bleeding/hemorrhagic complications: no    Recent/future hospitalizations: no    Any changes in medication regimen? no    Recent/future dental: no  Any missed doses?: no       Is patient compliant with meds? yes       Current Medications (verified): 1)  Synthroid 100 Mcg Tabs (Levothyroxine Sodium) .... One By Mouth Daily 2)  Proscar 5 Mg  Tabs (Finasteride) .Marland Kitchen.. 1 By Mouth Daily 3)  Fenofibrate 54 Mg  Tabs (Fenofibrate) .Marland Kitchen.. 1 By Mouth Daily 4)  Coreg 6.25 Mg Tabs (Carvedilol) .... Two Times A Day 5)  Iron 325 (65 Fe) Mg  Tabs (Ferrous Sulfate) .Marland Kitchen.. 1 By Mouth Once A Day 6)  Asprin 81mg  .... 1 By Mouth Daily 7)  Torsemide 20 Mg Tabs (Torsemide) .Marland Kitchen.. 1 1/2 By Mouth Qam and 1 Qpm 8)  Simvastatin 20 Mg Tabs (Simvastatin) .... One By Mouth Dialy 9)  Warfarin Sodium 2.5 Mg Tabs (Warfarin Sodium) .... Use As Directed By Anticoagualtion Clinic 10)  Nitroglycerin 0.4 Mg Subl (Nitroglycerin) .... One Tablet Under Tongue Every 5 Minutes As Needed For Chest Pain---May Repeat Times Three  Allergies (verified): 1)  ! Ace Inhibitors  Anticoagulation Management History:      The patient is taking warfarin and comes in today for a routine follow up visit.  Positive risk factors for bleeding include an age of 23 years or older and presence of serious comorbidities.  The bleeding index is 'intermediate risk'.  Positive CHADS2 values include History of CHF, History of HTN, and Age > 75 years old.  The start date was 08/04/2008.  Anticoagulation responsible provider: Jens Som MD, Arlys John.  INR POC: 2.5.  Cuvette Lot#: [id number].  Exp: 03/2011.    Anticoagulation  Management Assessment/Plan:      The patient's current anticoagulation dose is Warfarin sodium 2.5 mg tabs: Use as directed by Anticoagualtion Clinic.  The target INR is 2 - 3.  The next INR is due 02/06/2010.  Anticoagulation instructions were given to patient/spouse.  Results were reviewed/authorized by Bethanne Ginger, PharmD.  He was notified by Bethanne Ginger.         Prior Anticoagulation Instructions: INR 3.1  Decrease dosage regimen to taking only 1 tablet everyday.  Recheck in 2 weeks.  Current Anticoagulation Instructions: The patient is to continue with the same dose of coumadin.  This dosage includes:  1 tablet every day

## 2010-09-28 NOTE — Assessment & Plan Note (Signed)
Summary: 4 month rov/sl   Visit Type:  Follow-up Primary Provider:  Sharlot Gowda, MD  CC:  CHF and Edema.  History of Present Illness: The patient presents for followup of his ischemic cardiomyopathy. Since I last saw him he has been doing well. He has had no new shortness of breath, PND or orthopnea. He has had stable chronic lower extremity swelling. He has had no chest pressure, neck or arm discomfort. He keeps his feet elevated. He feels a little stronger with his walking. After the last visit I checked his creatinine and it was elevated but stable.  Current Medications (verified): 1)  Synthroid 100 Mcg Tabs (Levothyroxine Sodium) .... One By Mouth Daily 2)  Proscar 5 Mg  Tabs (Finasteride) .Marland Kitchen.. 1 By Mouth Daily 3)  Fenofibrate 54 Mg  Tabs (Fenofibrate) .Marland Kitchen.. 1 By Mouth Daily 4)  Coreg 6.25 Mg Tabs (Carvedilol) .... Two Times A Day 5)  Iron 325 (65 Fe) Mg  Tabs (Ferrous Sulfate) .Marland Kitchen.. 1 By Mouth Once A Day 6)  Asprin 81mg  .... 1 By Mouth Daily 7)  Torsemide 20 Mg Tabs (Torsemide) .Marland Kitchen.. 1 1/2 By Mouth Qam and 1 Qpm 8)  Simvastatin 20 Mg Tabs (Simvastatin) .... One By Mouth Dialy 9)  Warfarin Sodium 2.5 Mg Tabs (Warfarin Sodium) .... Use As Directed By Anticoagualtion Clinic 10)  Nitroglycerin 0.4 Mg Subl (Nitroglycerin) .... One Tablet Under Tongue Every 5 Minutes As Needed For Chest Pain---May Repeat Times Three  Allergies (verified): 1)  ! Ace Inhibitors  Past History:  Past Medical History: Reviewed history from 12/06/2008 and no changes required. 1. Coronary artery disease (status post CABG with LIMA to LAD; SVG to       first diagonal; SVG to intermediate; SVG to obtuse marginal, and       SVG to PDA in 2007.  He had PCI to an obtuse marginal lesion after       anastomosis of a vein graft insertion.  This was in December 2008       with a 2.25 Taxus stent).   2. Cardiomyopathy (EF 25% at the time of surgery, but improved to 55%.       The last echo again suggested to be  somewhat down, but 45% in       January).   3. Gastroesophageal reflux disease.   4. Hypertension.   5. Atrial fibrillation.   6. Chronic renal insufficiency.   7. Left frontal CVA.   8. Chronic anemia.   9. Abnormal sleep study.   10.Leg jerking.   Past Surgical History: Reviewed history from 12/06/2008 and no changes required. CABG in 2007 (LIMA to the LAD, SVG to the first   diagonal, SVG to ramus intermediate, SVG to obtuse marginal and SVG to   PDA).   Review of Systems       As stated in the HPI and negative for all other systems.   Vital Signs:  Patient profile:   77 year old male Height:      71 inches Weight:      173 pounds BMI:     24.22 Pulse rate:   76 / minute Resp:     16 per minute BP sitting:   105 / 58  (right arm)  Vitals Entered By: Marrion Coy, CNA (Jan 02, 2010 2:35 PM)  Physical Exam  General:  Well developed, well nourished, in no acute distress. Head:  normocephalic and atraumatic Eyes:  PERRLA/EOM intact; conjunctiva and lids normal. Neck:  Neck supple, no JVD. No masses, thyromegaly or abnormal cervical nodes. Chest Wall:  Well healed sternotomy scar. Lungs:  Clear bilaterally to auscultation and percussion. Abdomen:  Bowel sounds positive; abdomen soft and non-tender without masses, organomegaly, or hernias noted. No hepatosplenomegaly. Extremities:  No clubbing or cyanosis. Bilateral moderately severe lower extremity edema below the knees Neurologic:  Alert and oriented x 3. Psych:  Normal affect.   Detailed Cardiovascular Exam  Neck    Carotids: Carotids full and equal bilaterally without bruits.      Neck Veins: Normal, no JVD.    Heart    Inspection: no deformities or lifts noted.      Palpation: normal PMI with no thrills palpable.      Auscultation: irregular rate and rhythm, S1, S2 without murmurs, rubs, gallops, or clicks.    Vascular    Abdominal Aorta: no palpable masses, pulsations, or audible bruits.      Femoral  Pulses: normal femoral pulses bilaterally.      Pedal Pulses: normal pedal pulses bilaterally.      Radial Pulses: normal radial pulses bilaterally.      Peripheral Circulation: no clubbing, cyanosis,  with normal capillary refill.     Impression & Recommendations:  Problem # 1:  CHRONIC SYSTOLIC HEART FAILURE (ICD-428.22) The patient is doing well with respect to this. He needs to keep his feet elevated. I will make no change to his medical regimen.  Problem # 2:  ATRIAL FIBRILLATION (ICD-427.31) He tolerates Coumadin and will have this checked today.  Problem # 3:  CAD (ICD-414.00) He has no new symptoms. No further cardiovascular testing is suggested.  Patient Instructions: 1)  Your physician recommends that you schedule a follow-up appointment in: 2 months 2)  Your physician recommends that you continue on your current medications as directed. Please refer to the Current Medication list given to you today.

## 2010-09-28 NOTE — Medication Information (Signed)
Summary: rov/sp  Anticoagulant Therapy  Managed by: Bethena Midget, RN, BSN Referring MD: Rollene Rotunda MD PCP: Sharlot Gowda, MD Supervising MD: Ladona Ridgel MD, Sharlot Gowda Indication 1: Atrial Fibrillation (ICD-427.31) Lab Used: [facility] Site: [facility] INR POC 2.6 INR RANGE 2 - 3  Dietary changes: no    Health status changes: no    Bleeding/hemorrhagic complications: no    Recent/future hospitalizations: no    Any changes in medication regimen? no    Recent/future dental: no  Any missed doses?: no       Is patient compliant with meds? yes       Allergies: 1)  ! Ace Inhibitors  Anticoagulation Management History:      The patient is taking warfarin and comes in today for a routine follow up visit.  Positive risk factors for bleeding include an age of 75 years or older and presence of serious comorbidities.  The bleeding index is 'intermediate risk'.  Positive CHADS2 values include History of CHF, History of HTN, and Age > 21 years old.  The start date was 08/04/2008.  His last INR was 2.4.  Anticoagulation responsible provider: Ladona Ridgel MD, Sharlot Gowda.  INR POC: 2.6.  Cuvette Lot#: [id number].  Exp: 09/2011.    Anticoagulation Management Assessment/Plan:      The patient's current anticoagulation dose is Warfarin sodium 2.5 mg tabs: Use as directed by Anticoagualtion Clinic.  The target INR is 2 - 3.  The next INR is due 09/25/2010.  Anticoagulation instructions were given to patient/spouse.  Results were reviewed/authorized by Bethena Midget, RN, BSN.  He was notified by Bethena Midget, RN, BSN.         Prior Anticoagulation Instructions: INR 1.4  Take 2 tablets today and tomorrow then resume same dose of 1 tablet every day except 2 tablets on Monday.  Recheck INR in 2 weeks.   Current Anticoagulation Instructions: INR 2.6 Continue 1 tablet everyday except 2  tablets on Mondays. Recheck in 2 weeks.

## 2010-09-28 NOTE — Medication Information (Signed)
Summary: rov/sp  Anticoagulant Therapy  Managed by: Weston Brass, PharmD Referring MD: Rollene Rotunda MD PCP: Sharlot Gowda, MD Supervising MD: Johney Frame MD, Fayrene Fearing Indication 1: Atrial Fibrillation (ICD-427.31) Lab Used: [facility] Site: [facility] INR POC 2.4 INR RANGE 2 - 3  Dietary changes: no    Health status changes: no    Bleeding/hemorrhagic complications: no    Recent/future hospitalizations: no    Any changes in medication regimen? no    Recent/future dental: no  Any missed doses?: no       Is patient compliant with meds? yes      Comments: Pt reports taking 2 tablets on Monday and Friday instead of 1 1/2 tablets   Allergies: 1)  ! Ace Inhibitors  Anticoagulation Management History:      Positive risk factors for bleeding include an age of 75 years or older and presence of serious comorbidities.  The bleeding index is 'intermediate risk'.  Positive CHADS2 values include History of CHF, History of HTN, and Age > 65 years old.  The start date was 08/04/2008.  His last INR was 2.4.  Anticoagulation responsible provider: Allred MD, Fayrene Fearing.  INR POC: 2.4.  Exp: 05/2011.    Anticoagulation Management Assessment/Plan:      The patient's current anticoagulation dose is Warfarin sodium 2.5 mg tabs: Use as directed by Anticoagualtion Clinic.  The target INR is 2 - 3.  The next INR is due 06/07/2010.  Anticoagulation instructions were given to patient/spouse.  Results were reviewed/authorized by Weston Brass, PharmD.  He was notified by Weston Brass PharmD.         Prior Anticoagulation Instructions: INR 2.9  Continue with 1 tablet daily except 1.5 tablets Mon and Fri.  Return to clinic in 4 weeks.  Current Anticoagulation Instructions: INR 2.4  Continue same dose of 1 tablet every day except 2 tablets on Monday and Friday.  Recheck INR in 4 weeks.

## 2010-09-28 NOTE — Assessment & Plan Note (Signed)
Summary: 4 month rov 414.01 428.22  pfh,rn   Visit Type:  Follow-up Primary Ami Thornsberry:  Sharlot Gowda, MD  CC:  CAD and Cardiomyopathy.  History of Present Illness: The patient presents for followup of his known coronary disease and heart failure. He has done very well since I last saw him. He has had no acute shortness of breath and denies any PND or orthopnea. He has had no palpitations, presyncope or syncope. He denies any chest pressure, neck or arm discomfort. His weight has been steady. He said very mild ankle edema.   Current Medications (verified): 1)  Synthroid 100 Mcg Tabs (Levothyroxine Sodium) .... One By Mouth Daily 2)  Proscar 5 Mg  Tabs (Finasteride) .Marland Kitchen.. 1 By Mouth Daily 3)  Fenofibrate 54 Mg  Tabs (Fenofibrate) .Marland Kitchen.. 1 By Mouth Daily 4)  Coreg 6.25 Mg Tabs (Carvedilol) .... Two Times A Day 5)  Iron 325 (65 Fe) Mg  Tabs (Ferrous Sulfate) .... 2 By Mouth Daily 6)  Asprin 81mg  .... 1 By Mouth Daily 7)  Torsemide 20 Mg Tabs (Torsemide) .Marland Kitchen.. 1 1/2 By Mouth Qam and 1 Qpm 8)  Simvastatin 20 Mg Tabs (Simvastatin) .... One By Mouth Dialy 9)  Warfarin Sodium 2.5 Mg Tabs (Warfarin Sodium) .... Use As Directed By Anticoagualtion Clinic 10)  Nitroglycerin 0.4 Mg Subl (Nitroglycerin) .... One Tablet Under Tongue Every 5 Minutes As Needed For Chest Pain---May Repeat Times Three 11)  Lorazepam 0.5 Mg Tabs (Lorazepam) .... 1/2 Tablet By Mouth As Needed Every 8 Hours 12)  Metanx 3-35-2 Mg Tabs (L-Methylfolate-B6-B12) .... Take 1 Tablet By Mouth Two Times A Day 13)  Vitamin B Complex-C   Caps (B Complex-C) .Marland Kitchen.. 1 By Mouth Daily  Allergies (verified): 1)  ! Ace Inhibitors  Past History:  Past Medical History: Reviewed history from 12/06/2008 and no changes required. 1. Coronary artery disease (status post CABG with LIMA to LAD; SVG to       first diagonal; SVG to intermediate; SVG to obtuse marginal, and       SVG to PDA in 2007.  He had PCI to an obtuse marginal lesion after   anastomosis of a vein graft insertion.  This was in December 2008       with a 2.25 Taxus stent).   2. Cardiomyopathy (EF 25% at the time of surgery, but improved to 55%.       The last echo again suggested to be somewhat down, but 45% in       January).   3. Gastroesophageal reflux disease.   4. Hypertension.   5. Atrial fibrillation.   6. Chronic renal insufficiency.   7. Left frontal CVA.   8. Chronic anemia.   9. Abnormal sleep study.   10.Leg jerking.   Past Surgical History: Reviewed history from 12/06/2008 and no changes required. CABG in 2007 (LIMA to the LAD, SVG to the first   diagonal, SVG to ramus intermediate, SVG to obtuse marginal and SVG to   PDA).   Review of Systems       As stated in the HPI and negative for all other systems.   Vital Signs:  Patient profile:   77 year old male Height:      71 inches Weight:      176 pounds BMI:     24.64 Pulse rate:   79 / minute Resp:     16 per minute BP sitting:   110 / 68  (right arm)  Vitals Entered By:  Marrion Coy, CNA (July 11, 2010 3:11 PM)  Physical Exam  General:  Well developed, well nourished, in no acute distress. Head:  normocephalic and atraumatic Eyes:  PERRLA/EOM intact; conjunctiva and lids normal. Neck:  Neck supple, no JVD. No masses, thyromegaly or abnormal cervical nodes. Chest Wall:  Well healed sternotomy scar. Lungs:  Clear bilaterally to auscultation and percussion. Abdomen:  Bowel sounds positive; abdomen soft and non-tender without masses, organomegaly, or hernias noted. No hepatosplenomegaly. Msk:  Back normal, normal gait. Muscle strength and tone normal. Extremities:  No clubbing or cyanosis. Mild  lower extremity edema below the knees Neurologic:  Alert and oriented x 3. Skin:  Intact without lesions or rashes. Cervical Nodes:  no significant adenopathy Axillary Nodes:  no significant adenopathy Psych:  Normal affect.   Detailed Cardiovascular Exam  Neck    Carotids:  Carotids full and equal bilaterally without bruits.      Neck Veins: Normal, no JVD.    Heart    Inspection: no deformities or lifts noted.      Palpation: normal PMI with no thrills palpable.      Auscultation: irregular rate and rhythm, S1, S2 without murmurs, rubs, gallops, or clicks.    Vascular    Abdominal Aorta: no palpable masses, pulsations, or audible bruits.      Femoral Pulses: normal femoral pulses bilaterally.      Pedal Pulses: normal pedal pulses bilaterally.      Radial Pulses: normal radial pulses bilaterally.      Peripheral Circulation: no clubbing, cyanosis,  with normal capillary refill.     EKG  Procedure date:  07/11/2010  Findings:      Atrial fibrillation, rate 79, right axis deviation, no acute ST-T wave changes  Impression & Recommendations:  Problem # 1:  ISCHEMIC CARDIOMYOPATHY (ICD-414.8) The atienpt is having no new symptoms.  He will continue with the current regimine  Problem # 2:  ATRIAL FIBRILLATION (ICD-427.31) Atrial fibrillation.  He will continue with meds as listed.  Problem # 3:  RENAL INSUFFICIENCY, CHRONIC (ICD-585.9) I will check a BMET today.  Problem # 4:  CAD (ICD-414.00) He having no new symptoms and he will continue with risk reduction.  Other Orders: EKG w/ Interpretation (93000) TLB-BMP (Basic Metabolic Panel-BMET) (80048-METABOL)  Patient Instructions: 1)  Your physician recommends that you schedule a follow-up appointment in: 4 months with Dr Antoine Poche 2)  Your physician recommends that you have lab work today:  BMP 3)  Your physician recommends that you continue on your current medications as directed. Please refer to the Current Medication list given to you today.

## 2010-09-28 NOTE — Assessment & Plan Note (Signed)
Summary: rov/ gd   Visit Type:  Follow-up Primary Provider:  Sharlot Gowda, MD  CC:  CHF.  History of Present Illness: The patient presents as an add-on for evaluation of shortness of breath.  He saw his primary care team today and reported to them that he has been having some mild increased shortness of breath over a couple of days along with a cough with some mucus production. This happens more when he is lying down. The symptoms are somewhat vague. He says he has the sense that he is having more trouble taking a deep breath. He has had some increased ankle edema. He doesn't take his weights at home. In his primary care office his weight has actually been down 1 pound from the last visit. He does not keep his feet elevated as he is supposed to. He does avoid salt. He's not sleeping on more pillows. He's not having no palpitations, presyncope or syncope. He's not having chest pressure, neck or arm discomfort.   Current Medications (verified): 1)  Synthroid 100 Mcg Tabs (Levothyroxine Sodium) .... One By Mouth Daily 2)  Proscar 5 Mg  Tabs (Finasteride) .Marland Kitchen.. 1 By Mouth Daily 3)  Fenofibrate 54 Mg  Tabs (Fenofibrate) .Marland Kitchen.. 1 By Mouth Daily 4)  Coreg 6.25 Mg Tabs (Carvedilol) .... Two Times A Day 5)  Iron 325 (65 Fe) Mg  Tabs (Ferrous Sulfate) .Marland Kitchen.. 1 By Mouth Once A Day 6)  Plavix 75 Mg  Tabs (Clopidogrel Bisulfate) .Marland Kitchen.. 1 By Mouth Daily 7)  Asprin 81mg  .... 1 By Mouth Daily 8)  Torsemide 20 Mg Tabs (Torsemide) .Marland Kitchen.. 1 By Mouth Two Times A Day 9)  Simvastatin 20 Mg Tabs (Simvastatin) .... One By Mouth Dialy 10)  Warfarin Sodium 2.5 Mg Tabs (Warfarin Sodium) .... Use As Directed By Anticoagualtion Clinic 11)  Nitroglycerin 0.4 Mg Subl (Nitroglycerin) .... One Tablet Under Tongue Every 5 Minutes As Needed For Chest Pain---May Repeat Times Three  Allergies (verified): 1)  ! Ace Inhibitors  Past History:  Past Medical History: Reviewed history from 12/06/2008 and no changes required. 1.  Coronary artery disease (status post CABG with LIMA to LAD; SVG to       first diagonal; SVG to intermediate; SVG to obtuse marginal, and       SVG to PDA in 2007.  He had PCI to an obtuse marginal lesion after       anastomosis of a vein graft insertion.  This was in December 2008       with a 2.25 Taxus stent).   2. Cardiomyopathy (EF 25% at the time of surgery, but improved to 55%.       The last echo again suggested to be somewhat down, but 45% in       January).   3. Gastroesophageal reflux disease.   4. Hypertension.   5. Atrial fibrillation.   6. Chronic renal insufficiency.   7. Left frontal CVA.   8. Chronic anemia.   9. Abnormal sleep study.   10.Leg jerking.   Past Surgical History: Reviewed history from 12/06/2008 and no changes required. CABG in 2007 (LIMA to the LAD, SVG to the first   diagonal, SVG to ramus intermediate, SVG to obtuse marginal and SVG to   PDA).   Review of Systems       As stated in the HPI and negative for all other systems.   Vital Signs:  Patient profile:   77 year old male Height:  71 inches Weight:      175 pounds Pulse rate:   83 / minute Resp:     16 per minute BP sitting:   110 / 58  (right arm)  Vitals Entered By: Marrion Coy, CNA (November 15, 2009 4:08 PM)  Physical Exam  General:  Well developed, well nourished, in no acute distress. Head:  normocephalic and atraumatic Eyes:  PERRLA/EOM intact; conjunctiva and lids normal. Mouth:  Teeth, gums and palate normal. Oral mucosa normal. Neck:  Neck supple, no JVD. No masses, thyromegaly or abnormal cervical nodes. Chest Wall:  Well healed sternotomy scar. Lungs:  Clear bilaterally to auscultation and percussion. Abdomen:  Bowel sounds positive; abdomen soft and non-tender without masses, organomegaly, or hernias noted. No hepatosplenomegaly. Msk:  Back normal, normal gait. Muscle strength and tone normal. Extremities:  No clubbing or cyanosis. Bilateral moderately severe lower  extremity edema above the knees Neurologic:  Alert and oriented x 3. Skin:  Intact without lesions or rashes. Cervical Nodes:  no significant adenopathy Axillary Nodes:  no significant adenopathy Psych:  Normal affect.   Detailed Cardiovascular Exam  Neck    Carotids: Carotids full and equal bilaterally without bruits.      Neck Veins: Normal, no JVD.    Heart    Inspection: no deformities or lifts noted.      Palpation: normal PMI with no thrills palpable.      Auscultation: irregular rate and rhythm, S1, S2 without murmurs, rubs, gallops, or clicks.    Vascular    Abdominal Aorta: no palpable masses, pulsations, or audible bruits.      Femoral Pulses: normal femoral pulses bilaterally.      Pedal Pulses: normal pedal pulses bilaterally.      Radial Pulses: normal radial pulses bilaterally.      Peripheral Circulation: no clubbing, cyanosis, or edema noted with normal capillary refill.     EKG  Procedure date:  11/15/2009  Findings:      atrial fibrillation, rate 79, lateral T-wave inversions unchanged from previous  Impression & Recommendations:  Problem # 1:  CHRONIC SYSTOLIC HEART FAILURE (ICD-428.22) He does seem to have increased lower extremity edema. I instructed him again on how to keep his feet elevated and insists that he does this. For 3 days he will take an extra 10 mg of torsemide. They will then let me know if this improves but the cough and in particular the lower extremity edema. He did have a BNP and basic metabolic profile today. I will followup these results.  Problem # 2:  RENAL INSUFFICIENCY, CHRONIC (ICD-585.9) As above I will follow this closely.  Problem # 3:  CAD (ICD-414.00) I have no reason to suspect new ischemia. He will continue with risk reduction.  Problem # 4:  ATRIAL FIBRILLATION (ICD-427.31) He his tolerating this rhythm with rate control and anticoagulation.  Of note I reviewed all of my office records on this patient today. After  cardioversion many months ago he did maintain sinus rhythm for some period of time. However, it never seemed to contribute significantly to improvement in his symptoms or exacerbation. He's been back in atrial fibrillation over several previous visits. At this point I & and rate control and anticoagulation. I reviewed the use of Plavix which has been in place since placement of a Taxus stent. It has been well over a year since that event. I think now the risk of bleeding is higher than the benefit and so I will have him  stop the Plavix.  Patient Instructions: 1)  Your physician recommends that you schedule a follow-up appointment in: 1 month. 2)  Your physician has recommended you make the following change in your medication: Take an extra 10 mg once a day Torsemide( 1 1/2 tablet)   for the next 3 days.

## 2010-09-28 NOTE — Medication Information (Signed)
Summary: [name]  Anticoagulant Therapy  Managed by: Weston Brass, PharmD Referring MD: Rollene Rotunda MD PCP: Sharlot Gowda, MD Supervising MD: Excell Seltzer MD, Casimiro Needle Indication 1: Atrial Fibrillation (ICD-427.31) Lab Used: [facility] Site: [facility] INR POC 3.1 INR RANGE 2 - 3  Dietary changes: no    Health status changes: no    Bleeding/hemorrhagic complications: no    Recent/future hospitalizations: no    Any changes in medication regimen? no    Recent/future dental: no  Any missed doses?: no       Is patient compliant with meds? yes       Allergies: 1)  ! Ace Inhibitors  Anticoagulation Management History:      The patient is taking warfarin and comes in today for a routine follow up visit.  Positive risk factors for bleeding include an age of 75 years or older and presence of serious comorbidities.  The bleeding index is 'intermediate risk'.  Positive CHADS2 values include History of CHF, History of HTN, and Age > 3 years old.  The start date was 08/04/2008.  Anticoagulation responsible provider: Excell Seltzer MD, Casimiro Needle.  INR POC: 3.1.  Cuvette Lot#: [id number].  Exp: 12/2010.    Anticoagulation Management Assessment/Plan:      The patient's current anticoagulation dose is Warfarin sodium 2.5 mg tabs: Use as directed by Anticoagualtion Clinic.  The target INR is 2 - 3.  The next INR is due 01/02/2010.  Anticoagulation instructions were given to patient/spouse.  Results were reviewed/authorized by Weston Brass, PharmD.  He was notified by Weston Brass PharmD.         Prior Anticoagulation Instructions: INR 3.4  No coumadin tonight. Then resume 1 tablet daily except 2 tablets on Tuesdays and Thursdays.   Next INR in two weeks on Monday, April 25th at 3:00 pm.    Current Anticoagulation Instructions: INR 3.1  Decrease dose to 1 tablet every day except 2 tablets on Thursday

## 2010-09-28 NOTE — Medication Information (Signed)
Summary: rov/eh  Anticoagulant Therapy  Managed by: Shelby Dubin, PharmD, BCPS, CPP Referring MD: Rollene Rotunda MD PCP: Sharlot Gowda, MD Supervising MD: Shirlee Latch MD, Freida Busman Indication 1: Atrial Fibrillation (ICD-427.31) Lab Used: [facility] Site: [facility] INR POC 2.7 INR RANGE 2 - 3  Dietary changes: no    Health status changes: no    Bleeding/hemorrhagic complications: no    Recent/future hospitalizations: no    Any changes in medication regimen? no    Recent/future dental: no  Any missed doses?: no       Is patient compliant with meds? yes       Allergies: 1)  ! Ace Inhibitors  Anticoagulation Management History:      The patient is taking warfarin and comes in today for a routine follow up visit.  Positive risk factors for bleeding include an age of 71 years or older and presence of serious comorbidities.  The bleeding index is 'intermediate risk'.  Positive CHADS2 values include History of CHF, History of HTN, and Age > 25 years old.  The start date was 08/04/2008.  Anticoagulation responsible provider: Shirlee Latch MD, [place].  INR POC: 2.7.  Cuvette Lot#: [id number].  Exp: 11/2010.    Anticoagulation Management Assessment/Plan:      The patient's current anticoagulation dose is Warfarin sodium 2.5 mg tabs: Use as directed by Anticoagualtion Clinic.  The target INR is 2 - 3.  The next INR is due 11/07/2009.  Anticoagulation instructions were given to patient/spouse.  Results were reviewed/authorized by Shelby Dubin, PharmD, BCPS, CPP.  He was notified by Shelby Dubin PharmD, BCPS, CPP.         Prior Anticoagulation Instructions: INR 2.8  Continue same dose of 1 tablet daily except 2 tablets on Mondays, Wednesdays, and Fridays. Recheck in 4 weeks.  Current Anticoagulation Instructions: INR 2.7  Continue 1 tab daily except 2 tabs on Monday, Wednesday, Friday.  Recheck in 4 weeks.

## 2010-09-28 NOTE — Medication Information (Signed)
Summary: rov/sp  Anticoagulant Therapy  Managed by: Weston Brass, PharmD Referring MD: Rollene Rotunda MD PCP: Sharlot Gowda, MD Supervising MD: Jens Som MD, Arlys John Indication 1: Atrial Fibrillation (ICD-427.31) Lab Used: [facility] Site: [facility] INR POC 1.4 INR RANGE 2 - 3  Dietary changes: yes       Details: extra slaw and turnip greens  Health status changes: no    Bleeding/hemorrhagic complications: no    Recent/future hospitalizations: no    Any changes in medication regimen? no    Recent/future dental: no  Any missed doses?: yes     Details: missed yesterday's dose of Coumadin  Is patient compliant with meds? yes       Allergies: 1)  ! Ace Inhibitors  Anticoagulation Management History:      The patient is taking warfarin and comes in today for a routine follow up visit.  Positive risk factors for bleeding include an age of 75 years or older and presence of serious comorbidities.  The bleeding index is 'intermediate risk'.  Positive CHADS2 values include History of CHF, History of HTN, and Age > 58 years old.  The start date was 08/04/2008.  His last INR was 2.4.  Anticoagulation responsible provider: Jens Som MD, Arlys John.  INR POC: 1.4.  Cuvette Lot#: [id number].  Exp: 09/2011.    Anticoagulation Management Assessment/Plan:      The patient's current anticoagulation dose is Warfarin sodium 2.5 mg tabs: Use as directed by Anticoagualtion Clinic.  The target INR is 2 - 3.  The next INR is due 09/07/2010.  Anticoagulation instructions were given to patient/spouse.  Results were reviewed/authorized by Weston Brass, PharmD.  He was notified by Weston Brass PharmD.         Prior Anticoagulation Instructions: INR 3.3  Decrease dose to 1 tablet every day except 2 tablets on Monday.  Recheck INR in 3 weeks.   Current Anticoagulation Instructions: INR 1.4  Take 2 tablets today and tomorrow then resume same dose of 1 tablet every day except 2 tablets on Monday.  Recheck INR in 2  weeks.

## 2010-09-28 NOTE — Progress Notes (Signed)
Summary: stop plavix   ---- Converted from flag ---- ---- 11/15/2009 5:37 PM, Rollene Rotunda, MD, [facility] wrote: Have him stop the plavix please. ------------------------------  Phone Note Outgoing Call   Call placed by: Charolotte Capuchin, RN,  November 17, 2009 11:47 AM Call placed to: Patient Details for Reason: stop Plavix Summary of Call: Have attempted 4 times since 11/16/2009 to call pt to have him stop his Plavix.  The line is busy.  Will continue to contact. Initial call taken by: Charolotte Capuchin, RN,  November 17, 2009 11:47 AM  Follow-up for Phone Call        per wife - they take the phone off the hook until about 1 pm everyday because they "just dont sleep".  Wife aware for pt to stop Plavix per Dr Jenene Slicker order Follow-up by: Charolotte Capuchin, RN,  November 17, 2009 3:15 PM

## 2010-09-28 NOTE — Progress Notes (Signed)
Summary: fax lab report to dr.goldsborough/ done [facility],RN  Phone Note Call from Patient Call back at Home Phone [phone]   Caller: Spouse Reason for Call: Talk to Nurse Details for Reason: can you faxed lab report to dr. Annie Sable phone # [phone] . fax # [phone]. Initial call taken by: Lorne Skeens,  September 26, 2009 2:13 PM  Follow-up for Phone Call        labs were faxed Thursday Follow-up by: Charolotte Capuchin, RN,  September 26, 2009 2:19 PM

## 2010-09-28 NOTE — Assessment & Plan Note (Signed)
Summary: per check out/sf   Visit Type:  Follow-up Primary Provider:  Sharlot Gowda, MD  CC:  CHF.  History of Present Illness: The patient presents for 2 month followup. He had been having some increased lower extremity edema. However, he is keeping his feet elevated and now has less edema. He is having no shortness of breath, PND or orthopnea. He is having no palpitations, presyncope or syncope. He is having no chest pressure, neck or arm discomfort. Overall he feels like "my old self".  Current Medications (verified): 1)  Synthroid 100 Mcg Tabs (Levothyroxine Sodium) .... One By Mouth Daily 2)  Proscar 5 Mg  Tabs (Finasteride) .Marland Kitchen.. 1 By Mouth Daily 3)  Fenofibrate 54 Mg  Tabs (Fenofibrate) .Marland Kitchen.. 1 By Mouth Daily 4)  Coreg 6.25 Mg Tabs (Carvedilol) .... Two Times A Day 5)  Iron 325 (65 Fe) Mg  Tabs (Ferrous Sulfate) .Marland Kitchen.. 1 By Mouth Once A Day 6)  Asprin 81mg  .... 1 By Mouth Daily 7)  Torsemide 20 Mg Tabs (Torsemide) .Marland Kitchen.. 1 1/2 By Mouth Qam and 1 Qpm 8)  Simvastatin 20 Mg Tabs (Simvastatin) .... One By Mouth Dialy 9)  Warfarin Sodium 2.5 Mg Tabs (Warfarin Sodium) .... Use As Directed By Anticoagualtion Clinic 10)  Nitroglycerin 0.4 Mg Subl (Nitroglycerin) .... One Tablet Under Tongue Every 5 Minutes As Needed For Chest Pain---May Repeat Times Three  Allergies (verified): 1)  ! Ace Inhibitors  Past History:  Past Medical History: Reviewed history from 12/06/2008 and no changes required. 1. Coronary artery disease (status post CABG with LIMA to LAD; SVG to       first diagonal; SVG to intermediate; SVG to obtuse marginal, and       SVG to PDA in 2007.  He had PCI to an obtuse marginal lesion after       anastomosis of a vein graft insertion.  This was in December 2008       with a 2.25 Taxus stent).   2. Cardiomyopathy (EF 25% at the time of surgery, but improved to 55%.       The last echo again suggested to be somewhat down, but 45% in       January).   3. Gastroesophageal reflux  disease.   4. Hypertension.   5. Atrial fibrillation.   6. Chronic renal insufficiency.   7. Left frontal CVA.   8. Chronic anemia.   9. Abnormal sleep study.   10.Leg jerking.   Past Surgical History: Reviewed history from 12/06/2008 and no changes required. CABG in 2007 (LIMA to the LAD, SVG to the first   diagonal, SVG to ramus intermediate, SVG to obtuse marginal and SVG to   PDA).   Review of Systems       As stated in the HPI and negative for all other systems.   Vital Signs:  Patient profile:   77 year old male Height:      71 inches Weight:      175 pounds BMI:     24.50 Pulse rate:   91 / minute Resp:     16 per minute BP sitting:   110 / 62  (right arm)  Vitals Entered By: Marrion Coy, CNA (March 15, 2010 3:34 PM)  Physical Exam  General:  Well developed, well nourished, in no acute distress. Head:  normocephalic and atraumatic Neck:  Neck supple, no JVD. No masses, thyromegaly or abnormal cervical nodes. Chest Wall:  Well healed sternotomy scar. Lungs:  Clear bilaterally  to auscultation and percussion. Abdomen:  Bowel sounds positive; abdomen soft and non-tender without masses, organomegaly, or hernias noted. No hepatosplenomegaly. Msk:  Back normal, normal gait. Muscle strength and tone normal. Extremities:  No clubbing or cyanosis. Mild  lower extremity edema below the knees Neurologic:  Alert and oriented x 3. Skin:  Intact without lesions or rashes. Cervical Nodes:  no significant adenopathy Psych:  Normal affect.   Detailed Cardiovascular Exam  Neck    Carotids: Carotids full and equal bilaterally without bruits.      Neck Veins: Normal, no JVD.    Heart    Inspection: no deformities or lifts noted.      Palpation: normal PMI with no thrills palpable.      Auscultation: irregular rate and rhythm, S1, S2 without murmurs, rubs, gallops, or clicks.    Vascular    Abdominal Aorta: no palpable masses, pulsations, or audible bruits.      Femoral  Pulses: normal femoral pulses bilaterally.      Pedal Pulses: normal pedal pulses bilaterally.      Radial Pulses: normal radial pulses bilaterally.      Peripheral Circulation: no clubbing, cyanosis,  with normal capillary refill.     EKG  Procedure date:  03/15/2010  Findings:      atrial fibrillation, rate 90s, axis within normal limits, intervals within normal limits, lateral T-wave inversions unchanged, poor anterior R-wave progression, no significant change  Impression & Recommendations:  Problem # 1:  CHRONIC SYSTOLIC HEART FAILURE (ICD-428.22) He seems to be euvolemic. No change in therapy is indicated.  Problem # 2:  RENAL INSUFFICIENCY, CHRONIC (ICD-585.9) He is due to see his nephrologist in a couple of weeks and can have his basic metabolic profile checked again today her.  Problem # 3:  ATRIAL FIBRILLATION (ICD-427.31) He tolerates this rhythm with rate control and anticoagulation. Orders: EKG w/ Interpretation (93000)  Problem # 4:  CAD (ICD-414.00) He is having no chest pain. No further cardiovascular testing is suggested. Orders: EKG w/ Interpretation (93000)  Patient Instructions: 1)  Your physician recommends that you schedule a follow-up appointment in: 4 months with Dr Antoine Poche 2)  Your physician recommends that you continue on your current medications as directed. Please refer to the Current Medication list given to you today.

## 2010-09-28 NOTE — Medication Information (Signed)
Summary: rov.mlw  Anticoagulant Therapy  Managed by: Lew Dawes, PharmD Candidate Referring MD: Rollene Rotunda MD PCP: Sharlot Gowda, MD Supervising MD: Jens Som MD, Arlys John Indication 1: Atrial Fibrillation (ICD-427.31) Lab Used: [facility] Site: [facility] INR POC 2.8 INR RANGE 2 - 3  Dietary changes: no    Health status changes: no    Bleeding/hemorrhagic complications: no    Recent/future hospitalizations: no    Any changes in medication regimen? no    Recent/future dental: no  Any missed doses?: no       Is patient compliant with meds? yes       Allergies: 1)  ! Ace Inhibitors  Anticoagulation Management History:      The patient is taking warfarin and comes in today for a routine follow up visit.  Positive risk factors for bleeding include an age of 76 years or older and presence of serious comorbidities.  The bleeding index is 'intermediate risk'.  Positive CHADS2 values include History of CHF, History of HTN, and Age > 94 years old.  The start date was 08/04/2008.  Anticoagulation responsible provider: Jens Som MD, Arlys John.  INR POC: 2.8.  Cuvette Lot#: [id number].  Exp: 11/2010.    Anticoagulation Management Assessment/Plan:      The patient's current anticoagulation dose is Warfarin sodium 2.5 mg tabs: Use as directed by Anticoagualtion Clinic.  The target INR is 2 - 3.  The next INR is due 10/10/2009.  Anticoagulation instructions were given to patient/spouse.  Results were reviewed/authorized by Lew Dawes, PharmD Candidate.  He was notified by Lew Dawes, PharmD Candidate.         Prior Anticoagulation Instructions: INR 2.2  Continue same regimen of 1 tab (2.5 mg) everday, except 2 tabs (5 mg) on Mondays and Fridays.   Recheck in 4 weeks.     Current Anticoagulation Instructions: INR 2.8  Continue same dose of 1 tablet daily except 2 tablets on Mondays, Wednesdays, and Fridays. Recheck in 4 weeks.

## 2010-09-28 NOTE — Letter (Signed)
Summary: Dr Susann Givens Neuropathy's Office Note   Dr Susann Givens Neuropathy's Office Note   Imported By: Roderic Ovens 07/13/2010 17:06:13  _____________________________________________________________________  External Attachment:    Type:   Image     Comment:   External Document

## 2010-09-28 NOTE — Medication Information (Signed)
Summary: rov/tm  Anticoagulant Therapy  Managed by: Weston Brass, PharmD Referring MD: Rollene Rotunda MD PCP: Sharlot Gowda, MD Supervising MD: Excell Seltzer MD, Casimiro Needle Indication 1: Atrial Fibrillation (ICD-427.31) Lab Used: [facility]  Site: [facility] INR POC 3.3 INR RANGE 2 - 3  Dietary changes: no    Health status changes: no    Bleeding/hemorrhagic complications: no    Recent/future hospitalizations: no    Any changes in medication regimen? no    Recent/future dental: no  Any missed doses?: no       Is patient compliant with meds? yes       Current Medications (verified): 1)  Synthroid 100 Mcg Tabs (Levothyroxine Sodium) .... One By Mouth Daily 2)  Proscar 5 Mg  Tabs (Finasteride) .Marland Kitchen.. 1 By Mouth Daily 3)  Fenofibrate 54 Mg  Tabs (Fenofibrate) .Marland Kitchen.. 1 By Mouth Daily 4)  Coreg 6.25 Mg Tabs (Carvedilol) .... Two Times A Day 5)  Iron 325 (65 Fe) Mg  Tabs (Ferrous Sulfate) .... 2 By Mouth Daily 6)  Asprin 81mg  .... 1 By Mouth Daily 7)  Torsemide 20 Mg Tabs (Torsemide) .Marland Kitchen.. 1 1/2 By Mouth Qam and 1 Qpm 8)  Simvastatin 20 Mg Tabs (Simvastatin) .... One By Mouth Dialy 9)  Warfarin Sodium 2.5 Mg Tabs (Warfarin Sodium) .... Use As Directed By Anticoagualtion Clinic 10)  Nitroglycerin 0.4 Mg Subl (Nitroglycerin) .... One Tablet Under Tongue Every 5 Minutes As Needed For Chest Pain---May Repeat Times Three 11)  Lorazepam 0.5 Mg Tabs (Lorazepam) .... 1/2 Tablet By Mouth As Needed Every 8 Hours 12)  Metanx 3-35-2 Mg Tabs (L-Methylfolate-B6-B12) .... Take 1 Tablet By Mouth Two Times A Day  Allergies: 1)  ! Ace Inhibitors  Anticoagulation Management History:      The patient is taking warfarin and comes in today for a routine follow up visit.  Positive risk factors for bleeding include an age of 75 years or older and presence of serious comorbidities.  The bleeding index is 'intermediate risk'.  Positive CHADS2 values include History of CHF, History of HTN, and Age > 29 years old.   The start date was 08/04/2008.  His last INR was 2.4.  Anticoagulation responsible provider: Excell Seltzer MD, Casimiro Needle.  INR POC: 3.3.  Cuvette Lot#: [id number].  Exp: 07/2011.    Anticoagulation Management Assessment/Plan:      The patient's current anticoagulation dose is Warfarin sodium 2.5 mg tabs: Use as directed by Anticoagualtion Clinic.  The target INR is 2 - 3.  The next INR is due 08/01/2010.  Anticoagulation instructions were given to patient/spouse.  Results were reviewed/authorized by Weston Brass, PharmD.  He was notified by Weston Brass PharmD.         Prior Anticoagulation Instructions: INR 2.7   Continue taking 1 tablet everyday except 2 tablets on Monday and friday. Recheck in 4 weeks.  Current Anticoagulation Instructions: INR 3.3  Skip today's dose of Coumadin then resume same dose of 1 tablet every day except 2 tablets on Monday and Friday.  Recheck INR in 3 weeks.

## 2010-09-28 NOTE — Medication Information (Signed)
Summary: rov/td  Anticoagulant Therapy  Managed by: Lynann Bologna, PharmD Referring MD: Rollene Rotunda MD PCP: Sharlot Gowda, MD Supervising MD: Gala Romney MD, Reuel Boom Indication 1: Atrial Fibrillation (ICD-427.31) Lab Used: [facility]  Site: [facility] INR POC 1.7 INR RANGE 2 - 3  Dietary changes: no    Health status changes: no    Bleeding/hemorrhagic complications: no    Recent/future hospitalizations: no    Any changes in medication regimen? no    Recent/future dental: no  Any missed doses?: no       Is patient compliant with meds? yes       Current Medications (verified): 1)  Synthroid 100 Mcg Tabs (Levothyroxine Sodium) .... One By Mouth Daily 2)  Proscar 5 Mg  Tabs (Finasteride) .Marland Kitchen.. 1 By Mouth Daily 3)  Fenofibrate 54 Mg  Tabs (Fenofibrate) .Marland Kitchen.. 1 By Mouth Daily 4)  Coreg 6.25 Mg Tabs (Carvedilol) .... Two Times A Day 5)  Iron 325 (65 Fe) Mg  Tabs (Ferrous Sulfate) .Marland Kitchen.. 1 By Mouth Once A Day 6)  Asprin 81mg  .... 1 By Mouth Daily 7)  Torsemide 20 Mg Tabs (Torsemide) .Marland Kitchen.. 1 1/2 By Mouth Qam and 1 Qpm 8)  Simvastatin 20 Mg Tabs (Simvastatin) .... One By Mouth Dialy 9)  Warfarin Sodium 2.5 Mg Tabs (Warfarin Sodium) .... Use As Directed By Anticoagualtion Clinic 10)  Nitroglycerin 0.4 Mg Subl (Nitroglycerin) .... One Tablet Under Tongue Every 5 Minutes As Needed For Chest Pain---May Repeat Times Three  Allergies (verified): 1)  ! Ace Inhibitors  Anticoagulation Management History:      The patient is taking warfarin and comes in today for a routine follow up visit.  Positive risk factors for bleeding include an age of 75 years or older and presence of serious comorbidities.  The bleeding index is 'intermediate risk'.  Positive CHADS2 values include History of CHF, History of HTN, and Age > 75 years old.  The start date was 08/04/2008.  Anticoagulation responsible provider: Bensimhon MD, Reuel Boom.  INR POC: 1.7.  Cuvette Lot#: [id number].  Exp: 03/2011.     Anticoagulation Management Assessment/Plan:      The patient's current anticoagulation dose is Warfarin sodium 2.5 mg tabs: Use as directed by Anticoagualtion Clinic.  The target INR is 2 - 3.  The next INR is due 02/20/2010.  Anticoagulation instructions were given to patient/spouse.  Results were reviewed/authorized by Lynann Bologna, PharmD.  He was notified by Lynann Bologna.         Prior Anticoagulation Instructions: The patient is to continue with the same dose of coumadin.  This dosage includes:  1 tablet every day  Current Anticoagulation Instructions: INR 1.7   Take 1.5 tablets today. Then continue taking 1 tablet daily except 1.5 tablets on Mondays and Fridays.   Next INR on Monday, June 27th at 3:30 pm.

## 2010-09-28 NOTE — Letter (Signed)
Summary: FMLA papers  FMLA papers   Imported By: Erle Crocker 10/21/2009 09:19:18  _____________________________________________________________________  External Attachment:    Type:   Image     Comment:   External Document

## 2010-09-28 NOTE — Letter (Signed)
Summary: [facility]   [facility]   Imported By: Roderic Ovens 09/26/2009 15:51:37  _____________________________________________________________________  External Attachment:    Type:   Image     Comment:   External Document

## 2010-09-28 NOTE — Progress Notes (Signed)
Summary: Question about father being confused  Phone Note Call from Patient Call back at Home Phone [phone] Call back at [phone]   Caller: Daughter/ lori Summary of Call: Pt daughter calling regarding her father being confused Initial call taken by: Judie Grieve,  March 24, 2010 12:58 PM  Follow-up for Phone Call        Spoke with daughter - Lawson Fiscal - who is very active in pt's care.  She states her father received a phone call from someone stating he had won the lottery which is an obvious scam - however, the pt was confused and believing the people on the phone and the daughter and wife were concerned the pt maybe having a CVA aeb the confusion.  Pt is alert and oriented now, no weakness, slurred speech, facial drooping etc and no reason to believe he is having a CVA.  Pt has not missed any coumadin or asa doses.  The pt does have an appointment with Wynne Dust on Monday and will f/u with her.  The family is aware that if the pt should have any s/s of CVA they should call 911.  They both state understanding.  Follow-up by: Charolotte Capuchin, RN,  March 24, 2010 3:03 PM

## 2010-09-28 NOTE — Medication Information (Signed)
Summary: [name]  Anticoagulant Therapy  Managed by: Bethanne Ginger, PharmD Referring MD: Rollene Rotunda MD PCP: Sharlot Gowda, MD Supervising MD: Tenny Craw MD, Gunnar Fusi Indication 1: Atrial Fibrillation (ICD-427.31) Lab Used: [facility] [place] Site: [facility] INR POC 3.7 INR RANGE 2 - 3  Dietary changes: no    Health status changes: no    Bleeding/hemorrhagic complications: no    Recent/future hospitalizations: no    Any changes in medication regimen? no    Recent/future dental: no  Any missed doses?: no       Is patient compliant with meds? yes       Current Medications (verified): 1)  Synthroid 100 Mcg Tabs (Levothyroxine Sodium) .... One By Mouth Daily 2)  Proscar 5 Mg  Tabs (Finasteride) .Marland Kitchen.. 1 By Mouth Daily 3)  Fenofibrate 54 Mg  Tabs (Fenofibrate) .Marland Kitchen.. 1 By Mouth Daily 4)  Coreg 6.25 Mg Tabs (Carvedilol) .... Two Times A Day 5)  Iron 325 (65 Fe) Mg  Tabs (Ferrous Sulfate) .Marland Kitchen.. 1 By Mouth Once A Day 6)  Plavix 75 Mg  Tabs (Clopidogrel Bisulfate) .Marland Kitchen.. 1 By Mouth Daily 7)  Asprin 81mg  .... 1 By Mouth Daily 8)  Torsemide 20 Mg Tabs (Torsemide) .Marland Kitchen.. 1 By Mouth Two Times A Day 9)  Simvastatin 20 Mg Tabs (Simvastatin) .... One By Mouth Dialy 10)  Warfarin Sodium 2.5 Mg Tabs (Warfarin Sodium) .... Use As Directed By Anticoagualtion Clinic 11)  Nitroglycerin 0.4 Mg Subl (Nitroglycerin) .... One Tablet Under Tongue Every 5 Minutes As Needed For Chest Pain---May Repeat Times Three  Allergies (verified): 1)  ! Ace Inhibitors  Anticoagulation Management History:      The patient is taking warfarin and comes in today for a routine follow up visit.  Positive risk factors for bleeding include an age of 75 years or older and presence of serious comorbidities.  The bleeding index is 'intermediate risk'.  Positive CHADS2 values include History of CHF, History of HTN, and Age > 58 years old.  The start date was 08/04/2008.  Anticoagulation responsible provider: Tenny Craw MD, Gunnar Fusi.  INR POC: 3.7.   Cuvette Lot#: [id number].  Exp: 12/2010.    Anticoagulation Management Assessment/Plan:      The patient's current anticoagulation dose is Warfarin sodium 2.5 mg tabs: Use as directed by Anticoagualtion Clinic.  The target INR is 2 - 3.  The next INR is due 12/05/2009.  Anticoagulation instructions were given to patient/spouse.  Results were reviewed/authorized by Bethanne Ginger, PharmD.  He was notified by Bethanne Ginger.         Prior Anticoagulation Instructions: INR 2.7  Continue 1 tab daily except 2 tabs on Monday, Wednesday, Friday.  Recheck in 4 weeks.    Current Anticoagulation Instructions: INR=3.7 No Coumadin tonight Continue same dose  one tablet Sunday,Tuesday,Thursday,Saturday two tablets Monday, Wednesday, Friday

## 2010-09-28 NOTE — Progress Notes (Signed)
Summary: refill  Phone Note Refill Request Message from:  Patient on April 26, 2010 9:51 AM  Need a refll on Lorazepam .5mg  due to pt having death of a close friend  Initial call taken by: Judie Grieve,  April 26, 2010 9:53 AM  Follow-up for Phone Call        ok per Dr. Antoine Poche. Pt notified. Marrion Coy, CNA  April 26, 2010 10:20 AM  Follow-up by: Marrion Coy, CNA,  April 26, 2010 10:20 AM    New/Updated Medications: LORAZEPAM 0.5 MG TABS (LORAZEPAM) 1/2 tablet by mouth as needed every 8 hours Prescriptions: LORAZEPAM 0.5 MG TABS (LORAZEPAM) 1/2 tablet by mouth as needed every 8 hours  #20 x 2   Entered by:   Marrion Coy, CNA   Authorized by:   Rollene Rotunda, MD, [facility]   Signed by:   Marrion Coy, CNA on 04/26/2010   Method used:   Print then Give to Patient   RxID:   [id number]

## 2010-09-28 NOTE — Medication Information (Signed)
Summary: rov/sp  Anticoagulant Therapy  Managed by: Weston Brass, PharmD Referring MD: Rollene Rotunda MD PCP: Sharlot Gowda, MD Supervising MD: Daleen Squibb MD, Maisie Fus Indication 1: Atrial Fibrillation (ICD-427.31) Lab Used: [facility] [place] Site: [facility] INR POC 2.7 INR RANGE 2 - 3  Dietary changes: no    Health status changes: no    Bleeding/hemorrhagic complications: no    Recent/future hospitalizations: no    Any changes in medication regimen? no    Recent/future dental: no  Any missed doses?: no       Is patient compliant with meds? yes       Allergies: 1)  ! Ace Inhibitors  Anticoagulation Management History:      The patient is taking warfarin and comes in today for a routine follow up visit.  Positive risk factors for bleeding include an age of 50 years or older and presence of serious comorbidities.  The bleeding index is 'intermediate risk'.  Positive CHADS2 values include History of CHF, History of HTN, and Age > 75 years old.  The start date was 08/04/2008.  His last INR was 2.4.  Anticoagulation responsible provider: Daleen Squibb MD, Maisie Fus.  INR POC: 2.7.  Cuvette Lot#: [id number].  Exp: 06/2011.    Anticoagulation Management Assessment/Plan:      The patient's current anticoagulation dose is Warfarin sodium 2.5 mg tabs: Use as directed by Anticoagualtion Clinic.  The target INR is 2 - 3.  The next INR is due 07/05/2010.  Anticoagulation instructions were given to patient/spouse.  Results were reviewed/authorized by Weston Brass, PharmD.  He was notified by Ilean Skill D candidate.         Prior Anticoagulation Instructions: INR 2.4  Continue same dose of 1 tablet every day except 2 tablets on Monday and Friday.  Recheck INR in 4 weeks.   Current Anticoagulation Instructions: INR 2.7   Continue taking 1 tablet everyday except 2 tablets on Monday and friday. Recheck in 4 weeks.

## 2010-09-28 NOTE — Letter (Signed)
Summary: [facility]   Imported By: Marylou Mccoy 05/24/2010 11:41:39  _____________________________________________________________________  External Attachment:    Type:   Image     Comment:   External Document

## 2010-09-28 NOTE — Letter (Signed)
Summary: Handout Printed  Printed Handout:  - Coumadin Instructions-w/out Meds 

## 2010-09-28 NOTE — Assessment & Plan Note (Signed)
Summary: 4 MONTHS 414.01  428.22  PFH,RN   Visit Type:  Follow-up Referring Provider:  Angelina Sheriff Primary Provider:  Sharlot Gowda, MD  CC:  CAD.  History of Present Illness: The patient returns for followup of his coronary disease and cardiomyopathy. Since I last saw him he has done well. He has not noticed any palpitations and has had no presyncope or syncope. He has had no chest discomfort, neck or arm discomfort. He denies any new shortness of breath and has no PND or orthopnea. He does have some very mild ankle swelling.   Current Medications (verified): 1)  Synthroid 100 Mcg Tabs (Levothyroxine Sodium) .... One By Mouth Daily 2)  Proscar 5 Mg  Tabs (Finasteride) .Marland Kitchen.. 1 By Mouth Daily 3)  Fenofibrate 54 Mg  Tabs (Fenofibrate) .Marland Kitchen.. 1 By Mouth Daily 4)  Coreg 6.25 Mg Tabs (Carvedilol) .... Two Times A Day 5)  Iron 325 (65 Fe) Mg  Tabs (Ferrous Sulfate) .Marland Kitchen.. 1 By Mouth Once A Day 6)  Plavix 75 Mg  Tabs (Clopidogrel Bisulfate) .Marland Kitchen.. 1 By Mouth Daily 7)  Asprin 81mg  .... 1 By Mouth Daily 8)  Torsemide 20 Mg Tabs (Torsemide) .Marland Kitchen.. 1 By Mouth Two Times A Day 9)  Simvastatin 20 Mg Tabs (Simvastatin) .... One By Mouth Dialy 10)  Warfarin Sodium 2.5 Mg Tabs (Warfarin Sodium) .... Use As Directed By Anticoagualtion Clinic 11)  Nitroglycerin 0.4 Mg Subl (Nitroglycerin) .... One Tablet Under Tongue Every 5 Minutes As Needed For Chest Pain---May Repeat Times Three  Allergies (verified): 1)  ! Ace Inhibitors  Past History:  Past Medical History: Reviewed history from 12/06/2008 and no changes required. 1. Coronary artery disease (status post CABG with LIMA to LAD; SVG to       first diagonal; SVG to intermediate; SVG to obtuse marginal, and       SVG to PDA in 2007.  He had PCI to an obtuse marginal lesion after       anastomosis of a vein graft insertion.  This was in December 2008       with a 2.25 Taxus stent).   2. Cardiomyopathy (EF 25% at the time of surgery, but improved to 55%.       The last echo again suggested to be somewhat down, but 45% in       January).   3. Gastroesophageal reflux disease.   4. Hypertension.   5. Atrial fibrillation.   6. Chronic renal insufficiency.   7. Left frontal CVA.   8. Chronic anemia.   9. Abnormal sleep study.   10.Leg jerking.   Past Surgical History: Reviewed history from 12/06/2008 and no changes required. CABG in 2007 (LIMA to the LAD, SVG to the first   diagonal, SVG to ramus intermediate, SVG to obtuse marginal and SVG to   PDA).   Review of Systems       As stated in the HPI and negative for all other systems.   Vital Signs:  Patient profile:   77 year old male Height:      71 inches Weight:      169 pounds Pulse rate:   80 / minute Resp:     16 per minute BP sitting:   121 / 69  (right arm)  Vitals Entered By: Marrion Coy, CNA (September 19, 2009 2:37 PM)  Physical Exam  General:  Well developed, well nourished, in no acute distress. Head:  normocephalic and atraumatic Eyes:  PERRLA/EOM intact; conjunctiva and lids  normal. Mouth:  Teeth, gums and palate normal. Oral mucosa normal. Neck:  Neck supple, no JVD. No masses, thyromegaly or abnormal cervical nodes. Chest Wall:  Well healed sternotomy scar. Lungs:  Clear bilaterally to auscultation and percussion. Abdomen:  Bowel sounds positive; abdomen soft and non-tender without masses, organomegaly, or hernias noted. No hepatosplenomegaly. Msk:  Back normal, normal gait. Muscle strength and tone normal. Skin:  Intact without lesions or rashes. Psych:  Normal affect.   Detailed Cardiovascular Exam  Neck    Carotids: Carotids full and equal bilaterally without bruits.      Neck Veins: Normal, no JVD.    Heart    Inspection: no deformities or lifts noted.      Palpation: normal PMI with no thrills palpable.      Auscultation: regular rate and rhythm, S1, S2 without murmurs, rubs, gallops, or clicks.    Vascular    Abdominal Aorta: no palpable  masses, pulsations, or audible bruits.      Femoral Pulses: normal femoral pulses bilaterally.      Pedal Pulses: normal pedal pulses bilaterally.      Radial Pulses: normal radial pulses bilaterally.      Peripheral Circulation: no clubbing, cyanosis, or edema noted with normal capillary refill.     EKG  Procedure date:  09/19/2009  Findings:      atrial fibrillation, rate 77, axis within normal limits, intervals within normal limits, nonspecific lateral T-wave inversions.  Impression & Recommendations:  Problem # 1:  CHRONIC SYSTOLIC HEART FAILURE (ICD-428.22)  He seems to be euvolemic today. He is quite asymptomatic. I will leave him on the meds as listed.  Orders: TLB-BMP (Basic Metabolic Panel-BMET) (80048-METABOL)  Problem # 2:  RENAL INSUFFICIENCY, CHRONIC (ICD-585.9) I will check a basic metabolic profile today. I will share this with his nephrologist.  Problem # 3:  CAD (ICD-414.00) He his having no new symptoms. No further cardiovascular testing is indicated.  Problem # 4:  ATRIAL FIBRILLATION (ICD-427.31)  He tolerates this rhythm with rate control and anticoagulation.  Orders: EKG w/ Interpretation (93000)  Patient Instructions: 1)  Your physician recommends that you schedule a follow-up appointment in: 4 months with Dr Antoine Poche 2)  Your physician recommends that you continue on your current medications as directed. Please refer to the Current Medication list given to you today. 3)  Your physician recommends that you have lab work today

## 2010-09-28 NOTE — Progress Notes (Signed)
Summary: SOB/cough/congestion  Phone Note Call from Patient Call back at Home Phone [phone]   Caller: Patient Reason for Call: Talk to Nurse Summary of Call: pt has congestion and cough with SOB, request appt, has not seen PCP Initial call taken by: Migdalia Dk,  November 15, 2009 8:49 AM  Follow-up for Phone Call        developed a cough night before last mild swelling in feet over the last week has gianed 5 pounds over the last two months  wt = 171.8.  ok lying flat in bed at night.  wife says this just started and he has some sputum associated with the cough. it does n ot sound like CHF.  I have asked them to call their primary care MD first.  If they fill that he needs to be seen by Dr Antoine Poche to call me back today and I will be glad to arrange an appointment with Dr Antoine Poche for them.  she was very Adult nurse. Dennis Bast, RN, BSN  November 15, 2009 9:27 AM

## 2010-09-28 NOTE — Medication Information (Signed)
Summary: rov/mb /appt is 3:30/ gd  Anticoagulant Therapy  Managed by: Bethena Midget, RN, BSN Referring MD: Rollene Rotunda MD PCP: Sharlot Gowda, MD Supervising MD: Shirlee Latch MD, Sharalee Witman Indication 1: Atrial Fibrillation (ICD-427.31) Lab Used: [facility] [place] Site: [facility] INR POC 2.3 INR RANGE 2 - 3  Dietary changes: no    Health status changes: no    Bleeding/hemorrhagic complications: no    Recent/future hospitalizations: no    Any changes in medication regimen? no    Recent/future dental: no  Any missed doses?: no       Is patient compliant with meds? yes      Comments: Seeing Dr Antoine Poche today.   Allergies: 1)  ! Ace Inhibitors  Anticoagulation Management History:      The patient is taking warfarin and comes in today for a routine follow up visit.  Positive risk factors for bleeding include an age of 75 years or older and presence of serious comorbidities.  The bleeding index is 'intermediate risk'.  Positive CHADS2 values include History of CHF, History of HTN, and Age > 75 years old.  The start date was 08/04/2008.  His last INR was 2.4.  Anticoagulation responsible provider: Shirlee Latch MD, Levell Tavano.  INR POC: 2.3.  Cuvette Lot#: [id number].  Exp: 05/2011.    Anticoagulation Management Assessment/Plan:      The patient's current anticoagulation dose is Warfarin sodium 2.5 mg tabs: Use as directed by Anticoagualtion Clinic.  The target INR is 2 - 3.  The next INR is due 04/12/2010.  Anticoagulation instructions were given to patient/spouse.  Results were reviewed/authorized by Bethena Midget, RN, BSN.  He was notified by Bethena Midget, RN, BSN.         Prior Anticoagulation Instructions: INR=2.4  The patient is to continue with the same dose of coumadin.  This dosage includes: Take 1 tablet all days except for 1.5 on mondays and fridays  Current Anticoagulation Instructions: INR 2.3 Continue 1 pill everyday except 1.5 pills on Mondays and Fridays. Recheck in 4 weeks.

## 2010-09-28 NOTE — Progress Notes (Signed)
  Walk in Patient Form Recieved "Needs FMLA updated" forwarded to [employer] for processing [facility]  October 13, 2009 8:47 AM    Appended Document:  FMLA completed & Signed,called pt's Daughter for pick-up left Message on Voicemail.Marland KitchenKM

## 2010-09-28 NOTE — Medication Information (Signed)
Summary: rov/sp  Anticoagulant Therapy  Managed by: Jeralene Peters, PharmD Referring MD: Rollene Rotunda MD PCP: Sharlot Gowda, MD Supervising MD: Riley Kill MD, Maisie Fus Indication 1: Atrial Fibrillation (ICD-427.31) Lab Used: [facility] Site: [facility] INR RANGE 2 - 3  Dietary changes: yes       Details: may have eaten less green vegetables   Health status changes: no    Bleeding/hemorrhagic complications: no    Recent/future hospitalizations: no    Any changes in medication regimen? no    Recent/future dental: no  Any missed doses?: no       Is patient compliant with meds? yes       Current Medications (verified): 1)  Synthroid 100 Mcg Tabs (Levothyroxine Sodium) .... One By Mouth Daily 2)  Proscar 5 Mg  Tabs (Finasteride) .Marland Kitchen.. 1 By Mouth Daily 3)  Fenofibrate 54 Mg  Tabs (Fenofibrate) .Marland Kitchen.. 1 By Mouth Daily 4)  Coreg 6.25 Mg Tabs (Carvedilol) .... Two Times A Day 5)  Iron 325 (65 Fe) Mg  Tabs (Ferrous Sulfate) .Marland Kitchen.. 1 By Mouth Once A Day 6)  Asprin 81mg  .... 1 By Mouth Daily 7)  Torsemide 20 Mg Tabs (Torsemide) .Marland Kitchen.. 1 1/2 By Mouth Qam and 1 Qpm 8)  Simvastatin 20 Mg Tabs (Simvastatin) .... One By Mouth Dialy 9)  Warfarin Sodium 2.5 Mg Tabs (Warfarin Sodium) .... Use As Directed By Anticoagualtion Clinic 10)  Nitroglycerin 0.4 Mg Subl (Nitroglycerin) .... One Tablet Under Tongue Every 5 Minutes As Needed For Chest Pain---May Repeat Times Three  Allergies (verified): 1)  ! Ace Inhibitors  Anticoagulation Management History:      Positive risk factors for bleeding include an age of 75 years or older and presence of serious comorbidities.  The bleeding index is 'intermediate risk'.  Positive CHADS2 values include History of CHF, History of HTN, and Age > 80 years old.  The start date was 08/04/2008.  Anticoagulation responsible provider: Riley Kill MD, Maisie Fus.  Exp: 12/2010.    Anticoagulation Management Assessment/Plan:      The patient's current anticoagulation dose is  Warfarin sodium 2.5 mg tabs: Use as directed by Anticoagualtion Clinic.  The target INR is 2 - 3.  The next INR is due 01/16/2010.  Anticoagulation instructions were given to patient/spouse.  Results were reviewed/authorized by Jeralene Peters, PharmD.         Prior Anticoagulation Instructions: INR 3.1  Decrease dose to 1 tablet every day except 2 tablets on Thursday   Current Anticoagulation Instructions: INR 3.1  Decrease dosage regimen to taking only 1 tablet everyday.  Recheck in 2 weeks.

## 2010-09-28 NOTE — Medication Information (Signed)
Summary: rov/dg  Anticoagulant Therapy  Managed by: Lynann Bologna, PharmD Referring MD: Rollene Rotunda MD PCP: Sharlot Gowda, MD Supervising MD: Clifton James MD, Cristal Deer Indication 1: Atrial Fibrillation (ICD-427.31) Lab Used: [facility] Site: [facility] INR POC 3.4 INR RANGE 2 - 3  Dietary changes: no    Health status changes: no    Bleeding/hemorrhagic complications: no    Recent/future hospitalizations: no    Any changes in medication regimen? no    Recent/future dental: no  Any missed doses?: no       Is patient compliant with meds? yes       Current Medications (verified): 1)  Synthroid 100 Mcg Tabs (Levothyroxine Sodium) .... One By Mouth Daily 2)  Proscar 5 Mg  Tabs (Finasteride) .Marland Kitchen.. 1 By Mouth Daily 3)  Fenofibrate 54 Mg  Tabs (Fenofibrate) .Marland Kitchen.. 1 By Mouth Daily 4)  Coreg 6.25 Mg Tabs (Carvedilol) .... Two Times A Day 5)  Iron 325 (65 Fe) Mg  Tabs (Ferrous Sulfate) .Marland Kitchen.. 1 By Mouth Once A Day 6)  Plavix 75 Mg  Tabs (Clopidogrel Bisulfate) .Marland Kitchen.. 1 By Mouth Daily 7)  Asprin 81mg  .... 1 By Mouth Daily 8)  Torsemide 20 Mg Tabs (Torsemide) .Marland Kitchen.. 1 By Mouth Two Times A Day 9)  Simvastatin 20 Mg Tabs (Simvastatin) .... One By Mouth Dialy 10)  Warfarin Sodium 2.5 Mg Tabs (Warfarin Sodium) .... Use As Directed By Anticoagualtion Clinic 11)  Nitroglycerin 0.4 Mg Subl (Nitroglycerin) .... One Tablet Under Tongue Every 5 Minutes As Needed For Chest Pain---May Repeat Times Three  Allergies (verified): 1)  ! Ace Inhibitors  Anticoagulation Management History:      The patient is taking warfarin and comes in today for a routine follow up visit.  Positive risk factors for bleeding include an age of 17 years or older and presence of serious comorbidities.  The bleeding index is 'intermediate risk'.  Positive CHADS2 values include History of CHF, History of HTN, and Age > 85 years old.  The start date was 08/04/2008.  Anticoagulation responsible provider: Clifton James MD,  Cristal Deer.  INR POC: 3.4.  Cuvette Lot#: [id number].  Exp: 12/2010.    Anticoagulation Management Assessment/Plan:      The patient's current anticoagulation dose is Warfarin sodium 2.5 mg tabs: Use as directed by Anticoagualtion Clinic.  The target INR is 2 - 3.  The next INR is due 12/19/2009.  Anticoagulation instructions were given to patient/spouse.  Results were reviewed/authorized by Lynann Bologna, PharmD.  He was notified by Lynann Bologna.         Prior Anticoagulation Instructions: INR=3.7 No Coumadin tonight Continue same dose  one tablet Sunday,Tuesday,Thursday,Saturday two tablets Monday, Wednesday, Friday    Current Anticoagulation Instructions: INR 3.4  No coumadin tonight. Then resume 1 tablet daily except 2 tablets on Tuesdays and Thursdays.   Next INR in two weeks on Monday, April 25th at 3:00 pm.

## 2010-09-28 NOTE — Medication Information (Signed)
Summary: rov/mlw  Anticoagulant Therapy  Managed by: Loma Newton, PharmD Referring MD: Rollene Rotunda MD PCP: Sharlot Gowda, MD Supervising MD: Jens Som MD, Arlys John Indication 1: Atrial Fibrillation (ICD-427.31) Lab Used: [facility] Site: [facility] INR POC 2.4 INR RANGE 2 - 3  Dietary changes: yes       Details: eatin less greens  Health status changes: no    Bleeding/hemorrhagic complications: no    Recent/future hospitalizations: no    Any changes in medication regimen? no    Recent/future dental: no  Any missed doses?: no       Is patient compliant with meds? yes       Current Medications (verified): 1)  Synthroid 100 Mcg Tabs (Levothyroxine Sodium) .... One By Mouth Daily 2)  Proscar 5 Mg  Tabs (Finasteride) .Marland Kitchen.. 1 By Mouth Daily 3)  Fenofibrate 54 Mg  Tabs (Fenofibrate) .Marland Kitchen.. 1 By Mouth Daily 4)  Coreg 6.25 Mg Tabs (Carvedilol) .... Two Times A Day 5)  Iron 325 (65 Fe) Mg  Tabs (Ferrous Sulfate) .Marland Kitchen.. 1 By Mouth Once A Day 6)  Asprin 81mg  .... 1 By Mouth Daily 7)  Torsemide 20 Mg Tabs (Torsemide) .Marland Kitchen.. 1 1/2 By Mouth Qam and 1 Qpm 8)  Simvastatin 20 Mg Tabs (Simvastatin) .... One By Mouth Dialy 9)  Warfarin Sodium 2.5 Mg Tabs (Warfarin Sodium) .... Use As Directed By Anticoagualtion Clinic 10)  Nitroglycerin 0.4 Mg Subl (Nitroglycerin) .... One Tablet Under Tongue Every 5 Minutes As Needed For Chest Pain---May Repeat Times Three  Allergies (verified): 1)  ! Ace Inhibitors  Anticoagulation Management History:      The patient is taking warfarin and comes in today for a routine follow up visit.  Positive risk factors for bleeding include an age of 75 years or older and presence of serious comorbidities.  The bleeding index is 'intermediate risk'.  Positive CHADS2 values include History of CHF, History of HTN, and Age > 75 years old.  The start date was 08/04/2008.  Today's INR is 2.4.  Anticoagulation responsible provider: Jens Som MD, Arlys John.  INR POC: 2.4.  Cuvette  Lot#: [id number].  Exp: 03/2011.    Anticoagulation Management Assessment/Plan:      The patient's current anticoagulation dose is Warfarin sodium 2.5 mg tabs: Use as directed by Anticoagualtion Clinic.  The target INR is 2 - 3.  The next INR is due 03/20/2010.  Anticoagulation instructions were given to patient/spouse.  Results were reviewed/authorized by Loma Newton, PharmD.         Prior Anticoagulation Instructions: INR 1.7   Take 1.5 tablets today. Then continue taking 1 tablet daily except 1.5 tablets on Mondays and Fridays.   Next INR on Monday, June 27th at 3:30 pm.   Current Anticoagulation Instructions: INR=2.4  The patient is to continue with the same dose of coumadin.  This dosage includes: Take 1 tablet all days except for 1.5 on mondays and fridays

## 2010-09-28 NOTE — Assessment & Plan Note (Signed)
Summary: 1 month rov/sl   Visit Type:  Follow-up Primary Provider:  Sharlot Gowda, MD  CC:  CHF.  History of Present Illness: The patient returns for followup of the above. Since I last saw him he has done pretty well. He did have extra 10 mg of torsemide added to his a.m. regimen. He says his swelling is improved. He is not having a cough. He is describing no significant dyspnea, no PND or orthopnea. He has had no palpitations, presyncope or syncope. He has no chest pain. Per my instructions he stopped his Plavix after the last visit.  Current Medications (verified): 1)  Synthroid 100 Mcg Tabs (Levothyroxine Sodium) .... One By Mouth Daily 2)  Proscar 5 Mg  Tabs (Finasteride) .Marland Kitchen.. 1 By Mouth Daily 3)  Fenofibrate 54 Mg  Tabs (Fenofibrate) .Marland Kitchen.. 1 By Mouth Daily 4)  Coreg 6.25 Mg Tabs (Carvedilol) .... Two Times A Day 5)  Iron 325 (65 Fe) Mg  Tabs (Ferrous Sulfate) .Marland Kitchen.. 1 By Mouth Once A Day 6)  Asprin 81mg  .... 1 By Mouth Daily 7)  Torsemide 20 Mg Tabs (Torsemide) .Marland Kitchen.. 1 1/2 By Mouth Qam and 1 Qpm 8)  Simvastatin 20 Mg Tabs (Simvastatin) .... One By Mouth Dialy 9)  Warfarin Sodium 2.5 Mg Tabs (Warfarin Sodium) .... Use As Directed By Anticoagualtion Clinic 10)  Nitroglycerin 0.4 Mg Subl (Nitroglycerin) .... One Tablet Under Tongue Every 5 Minutes As Needed For Chest Pain---May Repeat Times Three  Allergies (verified): 1)  ! Ace Inhibitors  Past History:  Past Medical History: Reviewed history from 12/06/2008 and no changes required. 1. Coronary artery disease (status post CABG with LIMA to LAD; SVG to       first diagonal; SVG to intermediate; SVG to obtuse marginal, and       SVG to PDA in 2007.  He had PCI to an obtuse marginal lesion after       anastomosis of a vein graft insertion.  This was in December 2008       with a 2.25 Taxus stent).   2. Cardiomyopathy (EF 25% at the time of surgery, but improved to 55%.       The last echo again suggested to be somewhat down, but 45%  in       January).   3. Gastroesophageal reflux disease.   4. Hypertension.   5. Atrial fibrillation.   6. Chronic renal insufficiency.   7. Left frontal CVA.   8. Chronic anemia.   9. Abnormal sleep study.   10.Leg jerking.   Past Surgical History: Reviewed history from 12/06/2008 and no changes required. CABG in 2007 (LIMA to the LAD, SVG to the first   diagonal, SVG to ramus intermediate, SVG to obtuse marginal and SVG to   PDA).   Review of Systems       As stated in the HPI and negative for all other systems.   Vital Signs:  Patient profile:   77 year old male Height:      71 inches Weight:      171 pounds Pulse rate:   81 / minute Resp:     16 per minute BP sitting:   120 / 72  (right arm)  Vitals Entered By: Marrion Coy, CNA (December 12, 2009 2:34 PM)  Physical Exam  General:  Well developed, well nourished, in no acute distress. Head:  normocephalic and atraumatic Mouth:  Teeth, gums and palate normal. Oral mucosa normal. Neck:  Neck supple, no JVD.  No masses, thyromegaly or abnormal cervical nodes. Chest Wall:  Well healed sternotomy scar. Lungs:  Clear bilaterally to auscultation and percussion. Abdomen:  Bowel sounds positive; abdomen soft and non-tender without masses, organomegaly, or hernias noted. No hepatosplenomegaly. Msk:  Back normal, normal gait. Muscle strength and tone normal. Neurologic:  Alert and oriented x 3. Skin:  Intact without lesions or rashes. Psych:  Normal affect.   Detailed Cardiovascular Exam  Neck    Carotids: Carotids full and equal bilaterally without bruits.      Neck Veins: Normal, no JVD.    Heart    Inspection: no deformities or lifts noted.      Palpation: normal PMI with no thrills palpable.      Auscultation: irregular rate and rhythm, S1, S2 without murmurs, rubs, gallops, or clicks.    Vascular    Abdominal Aorta: no palpable masses, pulsations, or audible bruits.      Femoral Pulses: normal femoral pulses  bilaterally.      Pedal Pulses: normal pedal pulses bilaterally.      Radial Pulses: normal radial pulses bilaterally.      Peripheral Circulation: no clubbing, cyanosis, or edema noted with normal capillary refill.     Impression & Recommendations:  Problem # 1:  CHRONIC SYSTOLIC HEART FAILURE (ICD-428.22)  Today I would check a basic metabolic profile. I will check other labs requested by his nephrologist. Provided his creatinine is stable he will remain on the meds as listed.  The following medications were removed from the medication list:    Plavix 75 Mg Tabs (Clopidogrel bisulfate) .Marland Kitchen... 1 by mouth daily His updated medication list for this problem includes:    Coreg 6.25 Mg Tabs (Carvedilol) .Marland Kitchen..Marland Kitchen Two times a day    Torsemide 20 Mg Tabs (Torsemide) .Marland Kitchen... 1 1/2 by mouth qam and 1 qpm    Warfarin Sodium 2.5 Mg Tabs (Warfarin sodium) ..... Use as directed by anticoagualtion clinic    Nitroglycerin 0.4 Mg Subl (Nitroglycerin) ..... One tablet under tongue every 5 minutes as needed for chest pain---may repeat times three  Problem # 2:  ATRIAL FIBRILLATION (ICD-427.31)  He remains on Coumadin. He has had no problems with this. He has had no symptomatic arrhythmias.  The following medications were removed from the medication list:    Plavix 75 Mg Tabs (Clopidogrel bisulfate) .Marland Kitchen... 1 by mouth daily His updated medication list for this problem includes:    Coreg 6.25 Mg Tabs (Carvedilol) .Marland Kitchen..Marland Kitchen Two times a day    Warfarin Sodium 2.5 Mg Tabs (Warfarin sodium) ..... Use as directed by anticoagualtion clinic  Problem # 3:  CAD (ICD-414.00)  He has had no new symptoms. He will continue with risk reduction.  The following medications were removed from the medication list:    Plavix 75 Mg Tabs (Clopidogrel bisulfate) .Marland Kitchen... 1 by mouth daily His updated medication list for this problem includes:    Coreg 6.25 Mg Tabs (Carvedilol) .Marland Kitchen..Marland Kitchen Two times a day    Warfarin Sodium 2.5 Mg Tabs  (Warfarin sodium) ..... Use as directed by anticoagualtion clinic    Nitroglycerin 0.4 Mg Subl (Nitroglycerin) ..... One tablet under tongue every 5 minutes as needed for chest pain---may repeat times three  Orders: TLB-BMP (Basic Metabolic Panel-BMET) (80048-METABOL) TLB-CBC Platelet - w/Differential (85025-CBCD) TLB-Ferritin (82728-FER) TLB-IBC Pnl (Iron/FE;Transferrin) (83550-IBC) T-Parathyroid Hormone, Intact (40981-19147)  Patient Instructions: 1)  Your physician recommends that you schedule a follow-up appointment in: 2 months with Dr Antoine Poche 2)  Your physician recommends that  you haver lab work today 3)  Your physician recommends that you continue on your current medications as directed. Please refer to the Current Medication list given to you today.

## 2010-09-28 NOTE — Medication Information (Signed)
Summary: rov/tm  Anticoagulant Therapy  Managed by: Weston Brass, PharmD Referring MD: Rollene Rotunda MD PCP: Sharlot Gowda, MD Supervising MD: Juanda Chance MD, Bruce Indication 1: Atrial Fibrillation (ICD-427.31) Lab Used: [facility] Site: [facility] INR POC 2.9 INR RANGE 2 - 3  Dietary changes: no    Health status changes: no    Bleeding/hemorrhagic complications: no    Recent/future hospitalizations: no    Any changes in medication regimen? no    Recent/future dental: no  Any missed doses?: no       Is patient compliant with meds? yes       Allergies: 1)  ! Ace Inhibitors  Anticoagulation Management History:      The patient is taking warfarin and comes in today for a routine follow up visit.  Positive risk factors for bleeding include an age of 75 years or older and presence of serious comorbidities.  The bleeding index is 'intermediate risk'.  Positive CHADS2 values include History of CHF, History of HTN, and Age > 12 years old.  The start date was 08/04/2008.  His last INR was 2.4.  Anticoagulation responsible Harlyn Italiano: Juanda Chance MD, Smitty Cords.  INR POC: 2.9.  Cuvette Lot#: [id number].  Exp: 05/2011.    Anticoagulation Management Assessment/Plan:      The patient's current anticoagulation dose is Warfarin sodium 2.5 mg tabs: Use as directed by Anticoagualtion Clinic.  The target INR is 2 - 3.  The next INR is due 05/10/2010.  Anticoagulation instructions were given to patient/spouse.  Results were reviewed/authorized by Weston Brass, PharmD.  He was notified by Liana Gerold, PharmD Candidate.         Prior Anticoagulation Instructions: INR 2.3 Continue 1 pill everyday except 1.5 pills on Mondays and Fridays. Recheck in 4 weeks.   Current Anticoagulation Instructions: INR 2.9  Continue with 1 tablet daily except 1.5 tablets Mon and Fri.  Return to clinic in 4 weeks.

## 2010-10-02 ENCOUNTER — Encounter: Payer: Self-pay | Admitting: Cardiology

## 2010-10-02 ENCOUNTER — Telehealth (INDEPENDENT_AMBULATORY_CARE_PROVIDER_SITE_OTHER): Payer: Self-pay | Admitting: *Deleted

## 2010-10-04 NOTE — Medication Information (Signed)
Summary: rov/kh  Anticoagulant Therapy  Managed by: Bethena Midget, RN, BSN Referring MD: Rollene Rotunda MD PCP: Sharlot Gowda, MD Supervising MD: Antoine Poche MD, Fayrene Fearing Indication 1: Atrial Fibrillation (ICD-427.31) Lab Used: [facility] Site: [facility] INR POC 2.2 INR RANGE 2 - 3  Dietary changes: no    Health status changes: no    Bleeding/hemorrhagic complications: no    Recent/future hospitalizations: no    Any changes in medication regimen? no    Recent/future dental: no  Any missed doses?: no       Is patient compliant with meds? yes       Allergies: 1)  ! Ace Inhibitors  Anticoagulation Management History:      The patient is taking warfarin and comes in today for a routine follow up visit.  Positive risk factors for bleeding include an age of 60 years or older and presence of serious comorbidities.  The bleeding index is 'intermediate risk'.  Positive CHADS2 values include History of CHF, History of HTN, and Age > 56 years old.  The start date was 08/04/2008.  His last INR was 2.4.  Anticoagulation responsible provider: Antoine Poche MD, Fayrene Fearing.  INR POC: 2.2.  Cuvette Lot#: [id number].  Exp: 09/2011.    Anticoagulation Management Assessment/Plan:      The patient's current anticoagulation dose is Warfarin sodium 2.5 mg tabs: Use as directed by Anticoagualtion Clinic.  The target INR is 2 - 3.  The next INR is due 10/16/2010.  Anticoagulation instructions were given to patient/spouse.  Results were reviewed/authorized by Bethena Midget, RN, BSN.         Prior Anticoagulation Instructions: INR 2.6 Continue 1 tablet everyday except 2  tablets on Mondays. Recheck in 2 weeks.   Current Anticoagulation Instructions: INR 2.2  Continue taking 1 tablet everday except 2 tablets on Mondays. Recheck INR in 3 weeks.

## 2010-10-10 ENCOUNTER — Telehealth: Payer: Self-pay | Admitting: Cardiology

## 2010-10-11 ENCOUNTER — Encounter: Payer: Self-pay | Admitting: Internal Medicine

## 2010-10-11 ENCOUNTER — Ambulatory Visit (INDEPENDENT_AMBULATORY_CARE_PROVIDER_SITE_OTHER): Payer: Medicare Other | Admitting: Internal Medicine

## 2010-10-11 DIAGNOSIS — N189 Chronic kidney disease, unspecified: Secondary | ICD-10-CM

## 2010-10-11 DIAGNOSIS — D539 Nutritional anemia, unspecified: Secondary | ICD-10-CM

## 2010-10-11 DIAGNOSIS — I10 Essential (primary) hypertension: Secondary | ICD-10-CM

## 2010-10-11 DIAGNOSIS — J029 Acute pharyngitis, unspecified: Secondary | ICD-10-CM

## 2010-10-11 DIAGNOSIS — E039 Hypothyroidism, unspecified: Secondary | ICD-10-CM

## 2010-10-12 NOTE — Progress Notes (Addendum)
  Pt 's Daughter Dropped off FMLA papers for her to be covered for her Father, she also completed Packet sent all to [facility] [place]  October 02, 2010 3:09 PM     Appended Document:  FMLA completed,faxed a copy to [employer] w/ [employer] @ [phone]..mailed copy to Pt   Appended Document:  Spoke with Ruthine Dose yesterday Daughter of this pt, Her job gave her the FMLA back due to some things on it Imcomplete ( # 5,6,7) and they will not accept, I gave a copy to [place] for her to carry to [place] office where Hochrein will be seeing pt's today, He completed his part signed and she faxed back to me. I faxed a copy back to [employer] coordinator for [employer].Called pt to let her know everytning done.  Appended Document:  Spoke w/ Hurshel Party From [employer], he asked [place] to fix #6 on FMLA papers, and Fax back to [phone].  Appended Document:  #6 Corrected on FMLA,.Faxed to [employer] @ [employer] he ok'd that is is correct. Fax...[phone] Call back..[phone]

## 2010-10-16 DIAGNOSIS — Z8679 Personal history of other diseases of the circulatory system: Secondary | ICD-10-CM

## 2010-10-16 DIAGNOSIS — I4891 Unspecified atrial fibrillation: Secondary | ICD-10-CM

## 2010-10-16 DIAGNOSIS — Z7901 Long term (current) use of anticoagulants: Secondary | ICD-10-CM | POA: Insufficient documentation

## 2010-10-18 NOTE — Assessment & Plan Note (Signed)
Summary: NEW PT/ BLUE MEDICARE /[place]   Vital Signs:  Patient profile:   77 year old male Height:      70 inches Weight:      184 pounds BMI:     26.50 O2 Sat:      99 % on Room air Temp:     97.9 degrees F oral Pulse rate:   90 / minute Pulse rhythm:   irregularly irregular Resp:     16 per minute BP sitting:   140 / 68  (left arm)  Vitals Entered By: Burnard Leigh CMA(AAMA) (October 11, 2010 1:40 PM)  O2 Flow:  Room air CC: NP to establish/sls, cma, URI symptoms Is Patient Diabetic? No Pain Assessment Patient in pain? no        Primary Care Provider:  Etta Grandchild MD  CC:  NP to establish/sls, cma, and URI symptoms.  History of Present Illness:  URI Symptoms      This is an 77 year old man who presents with URI symptoms.  The symptoms began 2 days ago.  The severity is described as mild.  The patient reports sore throat, but denies nasal congestion, clear nasal discharge, purulent nasal discharge, dry cough, productive cough, earache, and sick contacts.  Associated symptoms include low-grade fever (<100.5 degrees).  The patient denies stiff neck, dyspnea, wheezing, rash, vomiting, diarrhea, use of an antipyretic, and response to antipyretic.  The patient denies itchy throat, sneezing, headache, muscle aches, and severe fatigue.  Risk factors for Strep sinusitis include absence of cough.  The patient denies the following risk factors for Strep sinusitis: unilateral facial pain, unilateral nasal discharge, poor response to decongestant, double sickening, tooth pain, Strep exposure, and tender adenopathy.    Preventive Screening-Counseling & Management  Alcohol-Tobacco     Alcohol drinks/day: 0     Alcohol Counseling: not indicated; patient does not drink     Smoking Status: never     Tobacco Counseling: not indicated; no tobacco use  Hep-HIV-STD-Contraception     Hepatitis Risk: no risk noted     HIV Risk: no risk noted     STD Risk: no risk noted     Dental  Visit-last 6 months no  Safety-Violence-Falls     Seat Belt Use: yes     Violence in the Home: not applicable     Fall Risk: None      Sexual History:  not active.        Drug Use:  never.        Blood Transfusions:  no.    Clinical Review Panels:  Immunizations   Last Flu Vaccine:  Historical (04/27/2010)  Lipid Management   Cholesterol:  94 (01/14/2007)   LDL (bad choesterol):  54 (01/14/2007)   HDL (good cholesterol):  29.3 (01/14/2007)  Diabetes Management   Creatinine:  2.8 (07/11/2010)   Last Flu Vaccine:  Historical (04/27/2010)  CBC   WBC:  6.8 (12/12/2009)   RBC:  3.51 (12/12/2009)   Hgb:  11.3 (12/12/2009)   Hct:  32.9 (12/12/2009)   Platelets:  122.0 (12/12/2009)   MCV  93.8 (12/12/2009)   MCHC  34.2 (12/12/2009)   RDW  14.1 (12/12/2009)   PMN:  67.4 (12/12/2009)   Lymphs:  24.9 (12/12/2009)   Monos:  4.4 (12/12/2009)   Eosinophils:  3.3 (12/12/2009)   Basophil:  0.0 (12/12/2009)  Complete Metabolic Panel   Glucose:  101 (07/11/2010)   Sodium:  142 (07/11/2010)   Potassium:  4.3 (07/11/2010)  Chloride:  104 (07/11/2010)   CO2:  30 (07/11/2010)   BUN:  51 (07/11/2010)   Creatinine:  2.8 (07/11/2010)   Calcium:  9.0 (07/11/2010)   Medications Prior to Update: 1)  Synthroid 100 Mcg Tabs (Levothyroxine Sodium) .... One By Mouth Daily 2)  Proscar 5 Mg  Tabs (Finasteride) .Marland Kitchen.. 1 By Mouth Daily 3)  Fenofibrate 54 Mg  Tabs (Fenofibrate) .Marland Kitchen.. 1 By Mouth Daily 4)  Coreg 6.25 Mg Tabs (Carvedilol) .... Two Times A Day 5)  Iron 325 (65 Fe) Mg  Tabs (Ferrous Sulfate) .... 2 By Mouth Daily 6)  Asprin 81mg  .... 1 By Mouth Daily 7)  Torsemide 20 Mg Tabs (Torsemide) .Marland Kitchen.. 1 1/2 By Mouth Qam and 1 Qpm 8)  Simvastatin 20 Mg Tabs (Simvastatin) .... One By Mouth Dialy 9)  Warfarin Sodium 2.5 Mg Tabs (Warfarin Sodium) .... Use As Directed By Anticoagualtion Clinic 10)  Nitroglycerin 0.4 Mg Subl (Nitroglycerin) .... One Tablet Under Tongue Every 5 Minutes As Needed  For Chest Pain---May Repeat Times Three 11)  Lorazepam 0.5 Mg Tabs (Lorazepam) .... 1/2 Tablet By Mouth As Needed Every 8 Hours 12)  Metanx 3-35-2 Mg Tabs (L-Methylfolate-B6-B12) .... Take 1 Tablet By Mouth Two Times A Day 13)  Vitamin B Complex-C   Caps (B Complex-C) .Marland Kitchen.. 1 By Mouth Daily  Current Medications (verified): 1)  Synthroid 100 Mcg Tabs (Levothyroxine Sodium) .... One By Mouth Daily 2)  Proscar 5 Mg  Tabs (Finasteride) .Marland Kitchen.. 1 By Mouth Daily 3)  Fenofibrate 54 Mg  Tabs (Fenofibrate) .Marland Kitchen.. 1 By Mouth Daily 4)  Coreg 6.25 Mg Tabs (Carvedilol) .... Two Times A Day 5)  Iron 325 (65 Fe) Mg  Tabs (Ferrous Sulfate) .... 2 By Mouth Daily 6)  Asprin 81mg  .... 1 By Mouth Daily 7)  Torsemide 20 Mg Tabs (Torsemide) .Marland Kitchen.. 1 1/2 By Mouth Qam and 1 Qpm 8)  Simvastatin 20 Mg Tabs (Simvastatin) .... One By Mouth Dialy 9)  Warfarin Sodium 2.5 Mg Tabs (Warfarin Sodium) .... Use As Directed By Anticoagualtion Clinic 10)  Nitroglycerin 0.4 Mg Subl (Nitroglycerin) .... One Tablet Under Tongue Every 5 Minutes As Needed For Chest Pain---May Repeat Times Three 11)  Lorazepam 0.5 Mg Tabs (Lorazepam) .... 1/2 Tablet By Mouth As Needed Every 8 Hours 12)  Metanx 3-35-2 Mg Tabs (L-Methylfolate-B6-B12) .... Take 1 Tablet By Mouth Two Times A Day 13)  Vitamin B Complex-C   Caps (B Complex-C) .Marland Kitchen.. 1 By Mouth Daily 14)  Amoxicillin 500 Mg Cap (Amoxicillin) .... Take 1 Capsule By Mouth Three Times A Day X 10 Days  Allergies (verified): 1)  ! Ace Inhibitors  Past History:  Past Surgical History: Last updated: 12/06/2008 CABG in 2007 (LIMA to the LAD, SVG to the first   diagonal, SVG to ramus intermediate, SVG to obtuse marginal and SVG to   PDA).   Family History: Last updated: 12/06/2008  Contributory for his mother dying with complications of   cancer.  His father died of a stroke.      Social History: Last updated: 12/06/2008 He is married and has been for 62 years!  He does not   smoke cigarettes.   He does not drink alcohol.  He is retired.   Risk Factors: Alcohol Use: 0 (10/11/2010)  Risk Factors: Smoking Status: never (10/11/2010)  Past Medical History: 1. Coronary artery disease (status post CABG with LIMA to LAD; SVG to       first diagonal; SVG to intermediate; SVG to obtuse marginal,  and       SVG to PDA in 2007.  He had PCI to an obtuse marginal lesion after       anastomosis of a vein graft insertion.  This was in December 2008       with a 2.25 Taxus stent).   2. Cardiomyopathy (EF 25% at the time of surgery, but improved to 55%.       The last echo again suggested to be somewhat down, but 45% in       January).   3. Gastroesophageal reflux disease.   4. Hypertension.   5. Atrial fibrillation.   6. Chronic renal insufficiency.   7. Left frontal CVA.   8. Chronic anemia.   9. Abnormal sleep study.   10.Leg jerking.  Hypothyroidism  Family History: Reviewed history from 12/06/2008 and no changes required.  Contributory for his mother dying with complications of   cancer.  His father died of a stroke.      Social History: Reviewed history from 12/06/2008 and no changes required. He is married and has been for 62 years!  He does not   smoke cigarettes.  He does not drink alcohol.  He is retired.  Seat Belt Use:  yes Fall Risk:  None Dental Care w/in 6 mos.:  no Hepatitis Risk:  no risk noted HIV Risk:  no risk noted STD Risk:  no risk noted Sexual History:  not active Drug Use:  never Blood Transfusions:  no  Review of Systems  The patient denies anorexia, fever, weight loss, weight gain, hoarseness, chest pain, syncope, dyspnea on exertion, peripheral edema, prolonged cough, headaches, hemoptysis, abdominal pain, hematuria, suspicious skin lesions, transient blindness, difficulty walking, depression, abnormal bleeding, and enlarged lymph nodes.   Endo:  Denies cold intolerance, excessive thirst, excessive urination, heat intolerance, polyuria, and weight  change.  Physical Exam  General:  Well developed, well nourished, in no acute distress. Head:  normocephalic and atraumatic Eyes:  vision grossly intact and no injection.   Ears:  R ear normal and L ear normal.   Nose:  External nasal examination shows no deformity or inflammation. Nasal mucosa are pink and moist without lesions or exudates. Mouth:  good dentition, no exudates, no posterior lymphoid hypertrophy, no postnasal drip, no pharyngeal crowing, no lesions, no aphthous ulcers, no erosions, no tongue abnormalities, no leukoplakia, no petechiae, and pharyngeal erythema.   Neck:  Neck supple, no JVD. No masses, thyromegaly or abnormal cervical nodes. Lungs:  normal respiratory effort, no intercostal retractions, no accessory muscle use, normal breath sounds, no dullness, no fremitus, no crackles, and no wheezes.   Heart:  normal rate, no murmur, no rub, no JVD, irregular rhythm, and S3 gallop.   Abdomen:  soft, non-tender, normal bowel sounds, no distention, no masses, no guarding, no rigidity, no rebound tenderness, no abdominal hernia, no inguinal hernia, no hepatomegaly, and no splenomegaly.   Msk:  No deformity or scoliosis noted of thoracic or lumbar spine.   Pulses:  R and L carotid,radial,femoral,dorsalis pedis and posterior tibial pulses are full and equal bilaterally Extremities:  No clubbing, cyanosis, edema, or deformity noted with normal full range of motion of all joints.   Neurologic:  No cranial nerve deficits noted. Station and gait are normal. Plantar reflexes are down-going bilaterally. DTRs are symmetrical throughout. Sensory, motor and coordinative functions appear intact. Skin:  turgor normal, color normal, no rashes, no suspicious lesions, no ecchymoses, no petechiae, no purpura, no ulcerations, and no edema.  Cervical Nodes:  no anterior cervical adenopathy and no posterior cervical adenopathy.   Axillary Nodes:  no R axillary adenopathy and no L axillary adenopathy.    Inguinal Nodes:  no R inguinal adenopathy and no L inguinal adenopathy.   Psych:  Oriented X3, normally interactive, good eye contact, not anxious appearing, not depressed appearing, not agitated, not suicidal, not homicidal, easily distracted, poor concentration, and memory impairment.     Impression & Recommendations:  Problem # 1:  PHARYNGITIS-ACUTE (ICD-462) Assessment New  His updated medication list for this problem includes:    Amoxicillin 500 Mg Cap (Amoxicillin) .Marland Kitchen... Take 1 capsule by mouth three times a day x 10 days  Instructed to complete antibiotics and call if not improved in 48 hours.   Orders: Venipuncture (45409) TLB-Lipid Panel (80061-LIPID) TLB-BMP (Basic Metabolic Panel-BMET) (80048-METABOL) TLB-CBC Platelet - w/Differential (85025-CBCD) TLB-Hepatic/Liver Function Pnl (80076-HEPATIC) TLB-TSH (Thyroid Stimulating Hormone) (84443-TSH)  Problem # 2:  HYPOTHYROIDISM (ICD-244.9) Assessment: Unchanged  His updated medication list for this problem includes:    Synthroid 100 Mcg Tabs (Levothyroxine sodium) ..... One by mouth daily  Orders: Venipuncture (81191) TLB-Lipid Panel (80061-LIPID) TLB-BMP (Basic Metabolic Panel-BMET) (80048-METABOL) TLB-CBC Platelet - w/Differential (85025-CBCD) TLB-Hepatic/Liver Function Pnl (80076-HEPATIC) TLB-TSH (Thyroid Stimulating Hormone) (84443-TSH)  Labs Reviewed: Chol: 94 (01/14/2007)   HDL: 29.3 (01/14/2007)   LDL: 54 (01/14/2007)   TG: 51 (01/14/2007)  Problem # 3:  ANEMIA, CHRONIC (ICD-281.9) Assessment: Unchanged  His updated medication list for this problem includes:    Iron 325 (65 Fe) Mg Tabs (Ferrous sulfate) .Marland Kitchen... 2 by mouth daily  Hgb: 11.3 (12/12/2009)   Hct: 32.9 (12/12/2009)   Platelets: 122.0 (12/12/2009) RBC: 3.51 (12/12/2009)   RDW: 14.1 (12/12/2009)   WBC: 6.8 (12/12/2009) MCV: 93.8 (12/12/2009)   MCHC: 34.2 (12/12/2009) Ferritin: 207.3 (12/12/2009) Iron: 66 (12/12/2009)   % Sat: 19.8  (12/12/2009)  Problem # 4:  RENAL INSUFFICIENCY, CHRONIC (ICD-585.9) Assessment: Unchanged  Orders: Venipuncture (47829) TLB-Lipid Panel (80061-LIPID) TLB-BMP (Basic Metabolic Panel-BMET) (80048-METABOL) TLB-CBC Platelet - w/Differential (85025-CBCD) TLB-Hepatic/Liver Function Pnl (80076-HEPATIC) TLB-TSH (Thyroid Stimulating Hormone) (84443-TSH)  Labs Reviewed: BUN: 51 (07/11/2010)   Cr: 2.8 (07/11/2010)    Hgb: 11.3 (12/12/2009)   Hct: 32.9 (12/12/2009)   Ca++: 9.0 (07/11/2010)     Complete Medication List: 1)  Synthroid 100 Mcg Tabs (Levothyroxine sodium) .... One by mouth daily 2)  Proscar 5 Mg Tabs (Finasteride) .Marland Kitchen.. 1 by mouth daily 3)  Fenofibrate 54 Mg Tabs (Fenofibrate) .Marland Kitchen.. 1 by mouth daily 4)  Coreg 6.25 Mg Tabs (Carvedilol) .... Two times a day 5)  Iron 325 (65 Fe) Mg Tabs (Ferrous sulfate) .... 2 by mouth daily 6)  Asprin 81mg   .... 1 by mouth daily 7)  Torsemide 20 Mg Tabs (Torsemide) .Marland Kitchen.. 1 1/2 by mouth qam and 1 qpm 8)  Simvastatin 20 Mg Tabs (Simvastatin) .... One by mouth dialy 9)  Warfarin Sodium 2.5 Mg Tabs (Warfarin sodium) .... Use as directed by anticoagualtion clinic 10)  Nitroglycerin 0.4 Mg Subl (Nitroglycerin) .... One tablet under tongue every 5 minutes as needed for chest pain---may repeat times three 11)  Lorazepam 0.5 Mg Tabs (Lorazepam) .... 1/2 tablet by mouth as needed every 8 hours 12)  Metanx 3-35-2 Mg Tabs (L-methylfolate-b6-b12) .... Take 1 tablet by mouth two times a day 13)  Vitamin B Complex-c Caps (B complex-c) .Marland Kitchen.. 1 by mouth daily 14)  Amoxicillin 500 Mg Cap (Amoxicillin) .... Take 1 capsule by mouth three times a day x 10 days  Patient  Instructions: 1)  Please schedule a follow-up appointment in 2 weeks. 2)  Take 650-1000mg  of Tylenol every 4-6 hours as needed for relief of pain or comfort of fever AVOID taking more than 4000mg   in a 24 hour period (can cause liver damage in higher doses). 3)  Take your antibiotic as prescribed until ALL  of it is gone, but stop if you develop a rash or swelling and contact our office as soon as possible. Prescriptions: AMOXICILLIN 500 MG CAP (AMOXICILLIN) Take 1 capsule by mouth three times a day X 10 days  #30 x 0   Entered and Authorized by:   Etta Grandchild MD   Signed by:   Etta Grandchild MD on 10/11/2010   Method used:   Electronically to        [id number] (retail)       [address]       [place], [place]  [address]       Ph: [phone]       Fax: [phone]   RxID:   [id number]    Orders Added: 1)  Venipuncture [84132] 2)  TLB-Lipid Panel [80061-LIPID] 3)  TLB-BMP (Basic Metabolic Panel-BMET) [80048-METABOL] 4)  TLB-CBC Platelet - w/Differential [85025-CBCD] 5)  TLB-Hepatic/Liver Function Pnl [80076-HEPATIC] 6)  TLB-TSH (Thyroid Stimulating Hormone) [84443-TSH] 7)  New Patient Level IV [44010]   Immunization History:  Influenza Immunization History:    Influenza:  historical (04/27/2010)   Immunization History:  Influenza Immunization History:    Influenza:  Historical (04/27/2010)

## 2010-10-18 NOTE — Progress Notes (Signed)
Summary: pt wants to see jh for sore throat  Phone Note Call from Patient   Caller: Patient [phone] Reason for Call: Talk to Nurse Summary of Call: pt's wife calling re pt has sore throat-wants dr Jalaysia Lobb to see him-I explained to her that he really needs to see his pcp, doesn't have one at the [facility] go to one of the urgent care centers-wants to talk with nurse re what to do Initial call taken by: Glynda Jaeger,  October 10, 2010 9:06 AM  Follow-up for Phone Call        Feliberto Gottron, NP not working in [place] any more.  Pt doesn't have a primary MD and is having trouble with sore throat that per wife tends to go into bronchitis.  Pt would like to be seen.  referred pt to Urgent Care or [facility].  Wife given the phone number to [facility] office and [place] office to call to schedule appt to establish and maybe worked in for a problem visit. Follow-up by: Charolotte Capuchin, RN,  October 10, 2010 9:18 AM

## 2010-10-24 NOTE — Letter (Signed)
Summary: FMLA  FMLA   Imported By: Marylou Mccoy 10/20/2010 14:54:59  _____________________________________________________________________  External Attachment:    Type:   Image     Comment:   External Document

## 2010-10-25 ENCOUNTER — Encounter: Payer: Self-pay | Admitting: Cardiology

## 2010-10-25 ENCOUNTER — Encounter: Payer: Self-pay | Admitting: Internal Medicine

## 2010-10-25 ENCOUNTER — Ambulatory Visit (INDEPENDENT_AMBULATORY_CARE_PROVIDER_SITE_OTHER): Payer: Medicare Other | Admitting: Internal Medicine

## 2010-10-25 DIAGNOSIS — J029 Acute pharyngitis, unspecified: Secondary | ICD-10-CM

## 2010-10-25 DIAGNOSIS — E039 Hypothyroidism, unspecified: Secondary | ICD-10-CM

## 2010-10-25 DIAGNOSIS — I10 Essential (primary) hypertension: Secondary | ICD-10-CM

## 2010-10-31 ENCOUNTER — Encounter: Payer: Self-pay | Admitting: Cardiology

## 2010-11-01 ENCOUNTER — Encounter: Payer: Self-pay | Admitting: Internal Medicine

## 2010-11-01 ENCOUNTER — Encounter (INDEPENDENT_AMBULATORY_CARE_PROVIDER_SITE_OTHER): Payer: Medicare Other

## 2010-11-01 DIAGNOSIS — Z7901 Long term (current) use of anticoagulants: Secondary | ICD-10-CM

## 2010-11-01 DIAGNOSIS — I4891 Unspecified atrial fibrillation: Secondary | ICD-10-CM

## 2010-11-01 LAB — CONVERTED CEMR LAB: POC INR: 2.7

## 2010-11-02 NOTE — Assessment & Plan Note (Signed)
Summary: 2 week fu/lb   Vital Signs:  Patient profile:   77 year old male Height:      70 inches (177.80 cm) Weight:      183.50 pounds (83.41 kg) BMI:     26.42 O2 Sat:      98 % on Room air Temp:     97.3 degrees F (36.28 degrees C) oral Pulse rate:   71 / minute Pulse rhythm:   irregularly irregular Resp:     14 per minute BP sitting:   120 / 68  Vitals Entered By: Burnard Leigh CMA(AAMA) (October 25, 2010 1:59 PM)  O2 Flow:  Room air CC: 2-week F/U URI/sls,cma Is Patient Diabetic? No Pain Assessment Patient in pain? no      Comments Pt states Amoxicillin worked well.   Primary Care Provider:  Etta Grandchild MD  CC:  2-week F/U URI/sls and cma.  History of Present Illness:  Follow-Up Visit      This is an 77 year old man who presents for Follow-up visit.  The patient denies chest pain, palpitations, dizziness, syncope, low blood sugar symptoms, high blood sugar symptoms, edema, SOB, DOE, PND, and orthopnea.  Since the last visit the patient notes no new problems or concerns.  The patient reports taking meds as prescribed.  When questioned about possible medication side effects, the patient notes none.    Preventive Screening-Counseling & Management  Alcohol-Tobacco     Alcohol drinks/day: 0     Alcohol Counseling: not indicated; patient does not drink     Smoking Status: never     Tobacco Counseling: not indicated; no tobacco use  Hep-HIV-STD-Contraception     Hepatitis Risk: no risk noted     HIV Risk: no risk noted     STD Risk: no risk noted     Dental Visit-last 6 months no      Sexual History:  not active.        Drug Use:  never.        Blood Transfusions:  no.    Clinical Review Panels:  Immunizations   Last Flu Vaccine:  Historical (04/27/2010)  Lipid Management   Cholesterol:  94 (01/14/2007)   LDL (bad choesterol):  54 (01/14/2007)   HDL (good cholesterol):  29.3 (01/14/2007)  Diabetes Management   Creatinine:  2.8 (07/11/2010)  Last Flu Vaccine:  Historical (04/27/2010)  CBC   WBC:  6.8 (12/12/2009)   RBC:  3.51 (12/12/2009)   Hgb:  11.3 (12/12/2009)   Hct:  32.9 (12/12/2009)   Platelets:  122.0 (12/12/2009)   MCV  93.8 (12/12/2009)   MCHC  34.2 (12/12/2009)   RDW  14.1 (12/12/2009)   PMN:  67.4 (12/12/2009)   Lymphs:  24.9 (12/12/2009)   Monos:  4.4 (12/12/2009)   Eosinophils:  3.3 (12/12/2009)   Basophil:  0.0 (12/12/2009)  Complete Metabolic Panel   Glucose:  101 (07/11/2010)   Sodium:  142 (07/11/2010)   Potassium:  4.3 (07/11/2010)   Chloride:  104 (07/11/2010)   CO2:  30 (07/11/2010)   BUN:  51 (07/11/2010)   Creatinine:  2.8 (07/11/2010)   Calcium:  9.0 (07/11/2010)   Medications Prior to Update: 1)  Synthroid 100 Mcg Tabs (Levothyroxine Sodium) .... One By Mouth Daily 2)  Proscar 5 Mg  Tabs (Finasteride) .Marland Kitchen.. 1 By Mouth Daily 3)  Fenofibrate 54 Mg  Tabs (Fenofibrate) .Marland Kitchen.. 1 By Mouth Daily 4)  Coreg 6.25 Mg Tabs (Carvedilol) .... Two Times A Day 5)  Iron 325 (65 Fe) Mg  Tabs (Ferrous Sulfate) .... 2 By Mouth Daily 6)  Asprin 81mg  .... 1 By Mouth Daily 7)  Torsemide 20 Mg Tabs (Torsemide) .Marland Kitchen.. 1 1/2 By Mouth Qam and 1 Qpm 8)  Simvastatin 20 Mg Tabs (Simvastatin) .... One By Mouth Dialy 9)  Warfarin Sodium 2.5 Mg Tabs (Warfarin Sodium) .... Use As Directed By Anticoagualtion Clinic 10)  Nitroglycerin 0.4 Mg Subl (Nitroglycerin) .... One Tablet Under Tongue Every 5 Minutes As Needed For Chest Pain---May Repeat Times Three 11)  Lorazepam 0.5 Mg Tabs (Lorazepam) .... 1/2 Tablet By Mouth As Needed Every 8 Hours 12)  Metanx 3-35-2 Mg Tabs (L-Methylfolate-B6-B12) .... Take 1 Tablet By Mouth Two Times A Day 13)  Vitamin B Complex-C   Caps (B Complex-C) .Marland Kitchen.. 1 By Mouth Daily  Current Medications (verified): 1)  Synthroid 100 Mcg Tabs (Levothyroxine Sodium) .... One By Mouth Daily 2)  Proscar 5 Mg  Tabs (Finasteride) .Marland Kitchen.. 1 By Mouth Daily 3)  Fenofibrate 54 Mg  Tabs (Fenofibrate) .Marland Kitchen.. 1 By Mouth  Daily 4)  Coreg 6.25 Mg Tabs (Carvedilol) .... Two Times A Day 5)  Iron 325 (65 Fe) Mg  Tabs (Ferrous Sulfate) .... 2 By Mouth Daily 6)  Asprin 81mg  .... 1 By Mouth Daily 7)  Torsemide 20 Mg Tabs (Torsemide) .Marland Kitchen.. 1 1/2 By Mouth Qam and 1 Qpm 8)  Simvastatin 20 Mg Tabs (Simvastatin) .... One By Mouth Dialy 9)  Warfarin Sodium 2.5 Mg Tabs (Warfarin Sodium) .... Use As Directed By Anticoagualtion Clinic 10)  Nitroglycerin 0.4 Mg Subl (Nitroglycerin) .... One Tablet Under Tongue Every 5 Minutes As Needed For Chest Pain---May Repeat Times Three 11)  Lorazepam 0.5 Mg Tabs (Lorazepam) .... 1/2 Tablet By Mouth As Needed Every 8 Hours 12)  Metanx 3-35-2 Mg Tabs (L-Methylfolate-B6-B12) .... Take 1 Tablet By Mouth Two Times A Day 13)  Vitamin B Complex-C   Caps (B Complex-C) .Marland Kitchen.. 1 By Mouth Daily  Allergies (verified): 1)  ! Ace Inhibitors  Past History:  Past Medical History: Last updated: 10/11/2010 1. Coronary artery disease (status post CABG with LIMA to LAD; SVG to       first diagonal; SVG to intermediate; SVG to obtuse marginal, and       SVG to PDA in 2007.  He had PCI to an obtuse marginal lesion after       anastomosis of a vein graft insertion.  This was in December 2008       with a 2.25 Taxus stent).   2. Cardiomyopathy (EF 25% at the time of surgery, but improved to 55%.       The last echo again suggested to be somewhat down, but 45% in       January).   3. Gastroesophageal reflux disease.   4. Hypertension.   5. Atrial fibrillation.   6. Chronic renal insufficiency.   7. Left frontal CVA.   8. Chronic anemia.   9. Abnormal sleep study.   10.Leg jerking.  Hypothyroidism  Past Surgical History: Last updated: 12/06/2008 CABG in 2007 (LIMA to the LAD, SVG to the first   diagonal, SVG to ramus intermediate, SVG to obtuse marginal and SVG to   PDA).   Family History: Last updated: 12/06/2008  Contributory for his mother dying with complications of   cancer.  His father  died of a stroke.      Social History: Last updated: 12/06/2008 He is married and has been for 62 years!  He  does not   smoke cigarettes.  He does not drink alcohol.  He is retired.   Risk Factors: Alcohol Use: 0 (10/25/2010)  Risk Factors: Smoking Status: never (10/25/2010)  Family History: Reviewed history from 12/06/2008 and no changes required.  Contributory for his mother dying with complications of   cancer.  His father died of a stroke.      Social History: Reviewed history from 12/06/2008 and no changes required. He is married and has been for 62 years!  He does not   smoke cigarettes.  He does not drink alcohol.  He is retired.   Review of Systems  The patient denies anorexia, fever, weight loss, weight gain, chest pain, syncope, dyspnea on exertion, peripheral edema, prolonged cough, headaches, hemoptysis, abdominal pain, hematuria, suspicious skin lesions, difficulty walking, depression, abnormal bleeding, and enlarged lymph nodes.    Physical Exam  General:  Well developed, well nourished, in no acute distress. Head:  normocephalic and atraumatic Ears:  R ear normal and L ear normal.   Mouth:  good dentition, pharynx pink and moist, no erythema, no exudates, no posterior lymphoid hypertrophy, no postnasal drip, no pharyngeal crowing, no lesions, no aphthous ulcers, no erosions, no tongue abnormalities, and no leukoplakia.   Neck:  Neck supple, no JVD. No masses, thyromegaly or abnormal cervical nodes. Lungs:  normal respiratory effort, no intercostal retractions, no accessory muscle use, normal breath sounds, no dullness, no fremitus, no crackles, and no wheezes.   Heart:  normal rate, no murmur, no rub, no JVD, irregular rhythm, and S3 gallop.   Abdomen:  soft, non-tender, normal bowel sounds, no distention, no masses, no guarding, no rigidity, no rebound tenderness, no abdominal hernia, no inguinal hernia, no hepatomegaly, and no splenomegaly.   Msk:  No deformity  or scoliosis noted of thoracic or lumbar spine.   Pulses:  R and L carotid,radial,femoral,dorsalis pedis and posterior tibial pulses are full and equal bilaterally Extremities:  No clubbing, cyanosis, edema, or deformity noted with normal full range of motion of all joints.   Neurologic:  No cranial nerve deficits noted. Station and gait are normal. Plantar reflexes are down-going bilaterally. DTRs are symmetrical throughout. Sensory, motor and coordinative functions appear intact. Skin:  turgor normal, color normal, no rashes, no suspicious lesions, no ecchymoses, no petechiae, no purpura, no ulcerations, and no edema.   Cervical Nodes:  no anterior cervical adenopathy and no posterior cervical adenopathy.   Psych:  Oriented X3, normally interactive, good eye contact, not anxious appearing, not depressed appearing, not agitated, not suicidal, not homicidal, easily distracted, poor concentration, and memory impairment.     Impression & Recommendations:  Problem # 1:  PHARYNGITIS-ACUTE (ICD-462) Assessment Improved  Instructed to complete antibiotics and call if not improved in 48 hours.   Problem # 2:  HYPOTHYROIDISM (ICD-244.9) Assessment: Unchanged  His updated medication list for this problem includes:    Synthroid 100 Mcg Tabs (Levothyroxine sodium) ..... One by mouth daily  Labs Reviewed: Chol: 94 (01/14/2007)   HDL: 29.3 (01/14/2007)   LDL: 54 (01/14/2007)   TG: 51 (01/14/2007)  Problem # 3:  HYPERTENSION (ICD-401.9) Assessment: Improved  His updated medication list for this problem includes:    Coreg 6.25 Mg Tabs (Carvedilol) .Marland Kitchen..Marland Kitchen Two times a day    Torsemide 20 Mg Tabs (Torsemide) .Marland Kitchen... 1 1/2 by mouth qam and 1 qpm  BP today: 120/68 Prior BP: 140/68 (10/11/2010)  Labs Reviewed: K+: 4.3 (07/11/2010) Creat: : 2.8 (07/11/2010)   Chol: 94 (01/14/2007)  HDL: 29.3 (01/14/2007)   LDL: 54 (01/14/2007)   TG: 51 (01/14/2007)  Complete Medication List: 1)  Synthroid 100 Mcg  Tabs (Levothyroxine sodium) .... One by mouth daily 2)  Proscar 5 Mg Tabs (Finasteride) .Marland Kitchen.. 1 by mouth daily 3)  Fenofibrate 54 Mg Tabs (Fenofibrate) .Marland Kitchen.. 1 by mouth daily 4)  Coreg 6.25 Mg Tabs (Carvedilol) .... Two times a day 5)  Iron 325 (65 Fe) Mg Tabs (Ferrous sulfate) .... 2 by mouth daily 6)  Asprin 81mg   .... 1 by mouth daily 7)  Torsemide 20 Mg Tabs (Torsemide) .Marland Kitchen.. 1 1/2 by mouth qam and 1 qpm 8)  Simvastatin 20 Mg Tabs (Simvastatin) .... One by mouth dialy 9)  Warfarin Sodium 2.5 Mg Tabs (Warfarin sodium) .... Use as directed by anticoagualtion clinic 10)  Nitroglycerin 0.4 Mg Subl (Nitroglycerin) .... One tablet under tongue every 5 minutes as needed for chest pain---may repeat times three 11)  Lorazepam 0.5 Mg Tabs (Lorazepam) .... 1/2 tablet by mouth as needed every 8 hours 12)  Metanx 3-35-2 Mg Tabs (L-methylfolate-b6-b12) .... Take 1 tablet by mouth two times a day 13)  Vitamin B Complex-c Caps (B complex-c) .Marland Kitchen.. 1 by mouth daily  Patient Instructions: 1)  Please schedule a follow-up appointment in 4 months. 2)  Check your Blood Pressure regularly. If it is above 130/80: you should make an appointment.   Orders Added: 1)  Est. Patient Level III [16109]

## 2010-11-07 NOTE — Letter (Signed)
Summary: FMLA  FMLA   Imported By: Marylou Mccoy 10/30/2010 09:56:43  _____________________________________________________________________  External Attachment:    Type:   Image     Comment:   External Document

## 2010-11-07 NOTE — Medication Information (Signed)
Summary: cvrr  Anticoagulant Therapy  Managed by: Windell Hummingbird, RN Referring MD: Rollene Rotunda MD PCP: Etta Grandchild MD Supervising MD: Graciela Husbands MD, Viviann Spare Indication 1: Atrial Fibrillation (ICD-427.31) Lab Used: [facility] Site: [facility] INR POC 2.7 INR RANGE 2 - 3  Dietary changes: no    Health status changes: no    Bleeding/hemorrhagic complications: no    Recent/future hospitalizations: no    Any changes in medication regimen? no    Recent/future dental: no  Any missed doses?: no       Is patient compliant with meds? yes       Allergies: 1)  ! Ace Inhibitors  Anticoagulation Management History:      The patient is taking warfarin and comes in today for a routine follow up visit.  Positive risk factors for bleeding include an age of 75 years or older and presence of serious comorbidities.  The bleeding index is 'intermediate risk'.  Positive CHADS2 values include History of CHF, History of HTN, and Age > 75 years old.  The start date was 08/04/2008.  His last INR was 2.4.  Anticoagulation responsible provider: Graciela Husbands MD, Viviann Spare.  INR POC: 2.7.  Cuvette Lot#: [id number].  Exp: 08/2011.    Anticoagulation Management Assessment/Plan:      The patient's current anticoagulation dose is Warfarin sodium 2.5 mg tabs: Use as directed by Anticoagualtion Clinic.  The target INR is 2 - 3.  The next INR is due 11/29/2010.  Anticoagulation instructions were given to patient/spouse.  Results were reviewed/authorized by Windell Hummingbird, RN.  He was notified by Windell Hummingbird, RN.         Prior Anticoagulation Instructions: INR 2.2  Continue taking 1 tablet everday except 2 tablets on Mondays. Recheck INR in 3 weeks.   Current Anticoagulation Instructions: INR 2.7 Continue taking 1 tablet every day, except take 2 tablets on Mondays. Recheck in 4 weeks.

## 2010-11-14 ENCOUNTER — Encounter: Payer: Self-pay | Admitting: Cardiology

## 2010-11-14 ENCOUNTER — Ambulatory Visit (INDEPENDENT_AMBULATORY_CARE_PROVIDER_SITE_OTHER): Payer: Medicare Other | Admitting: Cardiology

## 2010-11-14 VITALS — BP 128/58 | HR 91 | Wt 176.8 lb

## 2010-11-14 DIAGNOSIS — I2510 Atherosclerotic heart disease of native coronary artery without angina pectoris: Secondary | ICD-10-CM

## 2010-11-14 DIAGNOSIS — I5022 Chronic systolic (congestive) heart failure: Secondary | ICD-10-CM

## 2010-11-14 DIAGNOSIS — N189 Chronic kidney disease, unspecified: Secondary | ICD-10-CM

## 2010-11-14 DIAGNOSIS — I10 Essential (primary) hypertension: Secondary | ICD-10-CM

## 2010-11-14 DIAGNOSIS — I4891 Unspecified atrial fibrillation: Secondary | ICD-10-CM

## 2010-11-14 LAB — BASIC METABOLIC PANEL
BUN: 47 mg/dL — ABNORMAL HIGH (ref 6–23)
Calcium: 9 mg/dL (ref 8.4–10.5)
Chloride: 105 mEq/L (ref 96–112)
Creatinine, Ser: 2.9 mg/dL — ABNORMAL HIGH (ref 0.4–1.5)

## 2010-11-14 NOTE — Assessment & Plan Note (Signed)
The patients blood pressure is at target. No change in medications is indicated. We will continue with therapeutic lifestyle changes (TLC).  

## 2010-11-14 NOTE — Patient Instructions (Signed)
Have lab work today Follow up in 4 months with Dr Antoine Poche

## 2010-11-14 NOTE — Assessment & Plan Note (Signed)
I think the patient is euvolemic. We will continue with meds as listed.

## 2010-11-14 NOTE — Assessment & Plan Note (Signed)
He is having no new chest pain.  He will continue with the meds listed.

## 2010-11-14 NOTE — Assessment & Plan Note (Signed)
He tolerates anticoagulation and rate control.

## 2010-11-14 NOTE — Assessment & Plan Note (Signed)
I will check a BMET today. 

## 2010-11-14 NOTE — Progress Notes (Signed)
HPI The patient presents for follow up of the his known cardiomyopathy.  Since I last saw him he has done relatively well. He denies any chest discomfort, neck or arm discomfort. He has had no palpitations, presyncope or syncope. He has had no new PND or orthopnea. He has had stable lower extremity edema and weights.   Allergies  Allergen Reactions  . Ace Inhibitors     Current Outpatient Prescriptions on File Prior to Visit  Medication Sig Dispense Refill  . aspirin 81 MG tablet Take 81 mg by mouth daily.        . B Complex-C (B-COMPLEX WITH VITAMIN C) tablet Take 1 tablet by mouth daily.        . carvedilol (COREG) 6.25 MG tablet Take 6.25 mg by mouth 2 (two) times daily with a meal.        . fenofibrate 54 MG tablet Take 54 mg by mouth daily.        . ferrous sulfate 325 (65 FE) MG tablet 2 tablets by mouth daily       . finasteride (PROSCAR) 5 MG tablet Take 5 mg by mouth daily.        Marland Kitchen L-Methylfolate-B6-B12 (METANX) 3-35-2 MG TABS Take by mouth 2 (two) times daily.        Marland Kitchen levothyroxine (SYNTHROID) 100 MCG tablet Take 100 mcg by mouth daily.        . nitroGLYCERIN (NITROSTAT) 0.4 MG SL tablet Place 0.4 mg under the tongue every 5 (five) minutes as needed.        . simvastatin (ZOCOR) 20 MG tablet Take 20 mg by mouth at bedtime.        . torsemide (DEMADEX) 20 MG tablet 1 1/2 by mouth in the morning and 1 in the afternoon       . warfarin (COUMADIN) 2.5 MG tablet Take by mouth as directed.        Marland Kitchen LORazepam (ATIVAN) 0.5 MG tablet Take 1/2 tablet every 8 hours as needed         Past Medical History  Diagnosis Date  . Coronary artery disease     s/p CABG  . Cardiomyopathy   . GERD (gastroesophageal reflux disease)   . Hypertension   . Atrial fibrillation   . Chronic renal insufficiency   . CVA (cerebral infarction)     left frontal  . Anemia     chronic  . Hypothyroidism     Past Surgical History  Procedure Date  . Coronary artery bypass graft 2007    ROS As  stated in the HPI and negative for all other systems.   BP 128/58  Pulse 91  Wt 176 lb 12.8 oz (80.196 kg)  PHYSICAL EXAM:  Frail-appearing HEENT:  Pupils equal round and reactive, fundi not visualized NECK:  No jugular venous distention, waveform within normal limits, carotid upstroke brisk and symmetric, no bruits, no thyromegaly LYMPHATICS:  No cervicalLUNGS:  Clear to auscultation bilaterally BACK:  No CVA tenderness CHEST:  Healed sternotomy scar HEART:  PMI not displaced or sustained,,S1 and S2 within normal limits, no S3, no S4, no clicks, no rubs, no murmurs ABD:  Flat, positive bowel sounds normal in frequency in pitch, no bruits, no rebound, no guarding, no midline pulsatile mass, no hepatomegaly, no splenomegaly EXT:  2 plus pulses throughout, no edema, no cyanosis no clubbing, mild edema SKIN:  No rashes no nodules NEURO:  Cranial nerves II through XII grossly intact, motor grossly intact throughout  PSYCH:  Cognitively intact, oriented to person place and time, diminished hearing  EKG:  Atrial fibrillation, axis within normal limits, lateral T-wave inversion unchanged previous.  ASSESSMENT:

## 2010-11-20 ENCOUNTER — Encounter: Payer: Self-pay | Admitting: Cardiology

## 2010-11-29 ENCOUNTER — Encounter: Payer: Medicare Other | Admitting: *Deleted

## 2010-11-30 ENCOUNTER — Ambulatory Visit (INDEPENDENT_AMBULATORY_CARE_PROVIDER_SITE_OTHER): Payer: Medicare Other | Admitting: *Deleted

## 2010-11-30 DIAGNOSIS — Z7901 Long term (current) use of anticoagulants: Secondary | ICD-10-CM

## 2010-11-30 DIAGNOSIS — I4891 Unspecified atrial fibrillation: Secondary | ICD-10-CM

## 2010-11-30 DIAGNOSIS — Z8679 Personal history of other diseases of the circulatory system: Secondary | ICD-10-CM

## 2010-11-30 LAB — PROTIME-INR
INR: 2.78 — ABNORMAL HIGH (ref 0.00–1.49)
Prothrombin Time: 29.1 seconds — ABNORMAL HIGH (ref 11.6–15.2)

## 2010-12-03 LAB — POCT I-STAT, CHEM 8
BUN: 60 mg/dL — ABNORMAL HIGH (ref 6–23)
Calcium, Ion: 1.12 mmol/L (ref 1.12–1.32)
Chloride: 105 mEq/L (ref 96–112)
Creatinine, Ser: 2.5 mg/dL — ABNORMAL HIGH (ref 0.4–1.5)
Sodium: 141 mEq/L (ref 135–145)
TCO2: 27 mmol/L (ref 0–100)

## 2010-12-03 LAB — PROTIME-INR: INR: 3.2 — ABNORMAL HIGH (ref 0.00–1.49)

## 2010-12-11 LAB — BASIC METABOLIC PANEL
BUN: 41 mg/dL — ABNORMAL HIGH (ref 6–23)
BUN: 41 mg/dL — ABNORMAL HIGH (ref 6–23)
BUN: 43 mg/dL — ABNORMAL HIGH (ref 6–23)
BUN: 46 mg/dL — ABNORMAL HIGH (ref 6–23)
CO2: 21 mEq/L (ref 19–32)
CO2: 24 mEq/L (ref 19–32)
CO2: 24 mEq/L (ref 19–32)
CO2: 26 mEq/L (ref 19–32)
Calcium: 8.9 mg/dL (ref 8.4–10.5)
Calcium: 9 mg/dL (ref 8.4–10.5)
Calcium: 9 mg/dL (ref 8.4–10.5)
Calcium: 9.2 mg/dL (ref 8.4–10.5)
Chloride: 105 mEq/L (ref 96–112)
Chloride: 106 mEq/L (ref 96–112)
Chloride: 109 mEq/L (ref 96–112)
Chloride: 110 mEq/L (ref 96–112)
Creatinine, Ser: 1.96 mg/dL — ABNORMAL HIGH (ref 0.4–1.5)
Creatinine, Ser: 2.13 mg/dL — ABNORMAL HIGH (ref 0.4–1.5)
Creatinine, Ser: 2.25 mg/dL — ABNORMAL HIGH (ref 0.4–1.5)
Creatinine, Ser: 2.35 mg/dL — ABNORMAL HIGH (ref 0.4–1.5)
GFR calc Af Amer: 32 mL/min — ABNORMAL LOW (ref >60)
GFR calc Af Amer: 34 mL/min — ABNORMAL LOW (ref >60)
GFR calc Af Amer: 36 mL/min — ABNORMAL LOW (ref >60)
GFR calc Af Amer: 39 mL/min — ABNORMAL LOW (ref >60)
GFR calc non Af Amer: 26 mL/min — ABNORMAL LOW (ref >60)
GFR calc non Af Amer: 28 mL/min — ABNORMAL LOW (ref >60)
GFR calc non Af Amer: 30 mL/min — ABNORMAL LOW (ref >60)
GFR calc non Af Amer: 33 mL/min — ABNORMAL LOW (ref >60)
Glucose, Bld: 107 mg/dL — ABNORMAL HIGH (ref 70–99)
Glucose, Bld: 116 mg/dL — ABNORMAL HIGH (ref 70–99)
Glucose, Bld: 94 mg/dL (ref 70–99)
Glucose, Bld: 99 mg/dL (ref 70–99)
Potassium: 4 mEq/L (ref 3.5–5.1)
Potassium: 4.2 mEq/L (ref 3.5–5.1)
Potassium: 4.5 mEq/L (ref 3.5–5.1)
Potassium: 4.9 mEq/L (ref 3.5–5.1)
Sodium: 138 mEq/L (ref 135–145)
Sodium: 139 mEq/L (ref 135–145)
Sodium: 141 mEq/L (ref 135–145)
Sodium: 142 mEq/L (ref 135–145)

## 2010-12-11 LAB — CBC
HCT: 30.7 % — ABNORMAL LOW (ref 39.0–52.0)
HCT: 32.7 % — ABNORMAL LOW (ref 39.0–52.0)
HCT: 32.9 % — ABNORMAL LOW (ref 39.0–52.0)
HCT: 37.6 % — ABNORMAL LOW (ref 39.0–52.0)
Hemoglobin: 10.7 g/dL — ABNORMAL LOW (ref 13.0–17.0)
Hemoglobin: 10.8 g/dL — ABNORMAL LOW (ref 13.0–17.0)
Hemoglobin: 11.5 g/dL — ABNORMAL LOW (ref 13.0–17.0)
Hemoglobin: 12.3 g/dL — ABNORMAL LOW (ref 13.0–17.0)
MCHC: 32.5 g/dL (ref 30.0–36.0)
MCHC: 33 g/dL (ref 30.0–36.0)
MCV: 91.7 fL (ref 78.0–100.0)
MCV: 92.9 fL (ref 78.0–100.0)
MCV: 93.9 fL (ref 78.0–100.0)
MCV: 94.1 fL (ref 78.0–100.0)
Platelets: 100 10*3/uL — ABNORMAL LOW (ref 150–400)
Platelets: 102 10*3/uL — ABNORMAL LOW (ref 150–400)
Platelets: 110 10*3/uL — ABNORMAL LOW (ref 150–400)
Platelets: 97 10*3/uL — ABNORMAL LOW (ref 150–400)
RBC: 3.51 MIL/uL — ABNORMAL LOW (ref 4.22–5.81)
RBC: 3.52 MIL/uL — ABNORMAL LOW (ref 4.22–5.81)
RBC: 3.74 MIL/uL — ABNORMAL LOW (ref 4.22–5.81)
RBC: 3.81 MIL/uL — ABNORMAL LOW (ref 4.22–5.81)
RDW: 14.5 % (ref 11.5–15.5)
RDW: 14.6 % (ref 11.5–15.5)
RDW: 15 % (ref 11.5–15.5)
RDW: 15.3 % (ref 11.5–15.5)
WBC: 7.9 10*3/uL (ref 4.0–10.5)
WBC: 8.2 10*3/uL (ref 4.0–10.5)
WBC: 8.2 10*3/uL (ref 4.0–10.5)
WBC: 8.6 10*3/uL (ref 4.0–10.5)
WBC: 8.9 10*3/uL (ref 4.0–10.5)

## 2010-12-11 LAB — CK TOTAL AND CKMB (NOT AT ARMC): CK, MB: 2.8 ng/mL (ref 0.3–4.0)

## 2010-12-11 LAB — POCT I-STAT, CHEM 8
Chloride: 110 mEq/L (ref 96–112)
HCT: 39 % (ref 39.0–52.0)
Hemoglobin: 13.3 g/dL (ref 13.0–17.0)
Potassium: 5.4 mEq/L — ABNORMAL HIGH (ref 3.5–5.1)
Sodium: 140 mEq/L (ref 135–145)

## 2010-12-11 LAB — CARDIAC PANEL(CRET KIN+CKTOT+MB+TROPI)
CK, MB: 1.8 ng/mL (ref 0.3–4.0)
CK, MB: 1.9 ng/mL (ref 0.3–4.0)
Relative Index: INVALID (ref 0.0–2.5)
Total CK: 40 U/L (ref 7–232)
Total CK: 52 U/L (ref 7–232)
Troponin I: 0.01 ng/mL (ref 0.00–0.06)
Troponin I: 0.01 ng/mL (ref 0.00–0.06)
Troponin I: 0.03 ng/mL (ref 0.00–0.06)

## 2010-12-11 LAB — COMPREHENSIVE METABOLIC PANEL
ALT: 17 U/L (ref 0–53)
Alkaline Phosphatase: 39 U/L (ref 39–117)
CO2: 24 mEq/L (ref 19–32)
Chloride: 110 mEq/L (ref 96–112)
Glucose, Bld: 116 mg/dL — ABNORMAL HIGH (ref 70–99)
Potassium: 5 mEq/L (ref 3.5–5.1)
Sodium: 139 mEq/L (ref 135–145)
Total Bilirubin: 1 mg/dL (ref 0.3–1.2)
Total Protein: 6.4 g/dL (ref 6.0–8.3)

## 2010-12-11 LAB — PROTIME-INR
INR: 1.8 — ABNORMAL HIGH (ref 0.00–1.49)
INR: 1.8 — ABNORMAL HIGH (ref 0.00–1.49)
INR: 2.3 — ABNORMAL HIGH (ref 0.00–1.49)
Prothrombin Time: 22.2 seconds — ABNORMAL HIGH (ref 11.6–15.2)
Prothrombin Time: 22.2 seconds — ABNORMAL HIGH (ref 11.6–15.2)
Prothrombin Time: 23.1 seconds — ABNORMAL HIGH (ref 11.6–15.2)
Prothrombin Time: 26.9 seconds — ABNORMAL HIGH (ref 11.6–15.2)

## 2010-12-11 LAB — HEPARIN LEVEL (UNFRACTIONATED)
Heparin Unfractionated: 0.1 IU/mL — ABNORMAL LOW (ref 0.30–0.70)
Heparin Unfractionated: 0.38 IU/mL (ref 0.30–0.70)
Heparin Unfractionated: 0.69 IU/mL (ref 0.30–0.70)

## 2010-12-11 LAB — HEPARIN INDUCED THROMBOCYTOPENIA PNL
Heparin Induced Plt Ab: NEGATIVE
Patient O.D.: 0.199

## 2010-12-11 LAB — BRAIN NATRIURETIC PEPTIDE: Pro B Natriuretic peptide (BNP): 1358 pg/mL — ABNORMAL HIGH (ref 0.0–100.0)

## 2010-12-11 LAB — DIFFERENTIAL
Lymphocytes Relative: 18 % (ref 12–46)
Lymphs Abs: 1.6 10*3/uL (ref 0.7–4.0)
Monocytes Relative: 8 % (ref 3–12)
Neutrophils Relative %: 71 % (ref 43–77)

## 2010-12-11 LAB — TSH: TSH: 2.659 u[IU]/mL (ref 0.350–4.500)

## 2010-12-11 LAB — TROPONIN I: Troponin I: 0.03 ng/mL (ref 0.00–0.06)

## 2010-12-12 LAB — BASIC METABOLIC PANEL
CO2: 24 mEq/L (ref 19–32)
Calcium: 8.8 mg/dL (ref 8.4–10.5)
Creatinine, Ser: 2.24 mg/dL — ABNORMAL HIGH (ref 0.4–1.5)
GFR calc Af Amer: 34 mL/min — ABNORMAL LOW (ref >60)
Glucose, Bld: 111 mg/dL — ABNORMAL HIGH (ref 70–99)

## 2010-12-28 ENCOUNTER — Ambulatory Visit (INDEPENDENT_AMBULATORY_CARE_PROVIDER_SITE_OTHER): Payer: Medicare Other | Admitting: *Deleted

## 2010-12-28 DIAGNOSIS — Z7901 Long term (current) use of anticoagulants: Secondary | ICD-10-CM

## 2010-12-28 DIAGNOSIS — Z8679 Personal history of other diseases of the circulatory system: Secondary | ICD-10-CM

## 2010-12-28 DIAGNOSIS — I4891 Unspecified atrial fibrillation: Secondary | ICD-10-CM

## 2011-01-09 NOTE — Assessment & Plan Note (Signed)
[facility]                            CARDIOLOGY OFFICE NOTE   [name], [name]                      MRN:          [id number]  DATE:10/04/2008                            DOB:          1920/03/17    PRIMARY CARE PHYSICIAN:  Dr. Katha Hamming.   REASON FOR PRESENTATION:  Evaluate the patient with heart failure and  atrial fibrillation.   HISTORY OF PRESENT ILLNESS:  The patient was admitted on September 13, 2008, with atrial fibrillation and a rapid rate.  I had been treating  him with Coumadin and had planned electrocardioversion.  However, he  presented with chest discomfort and shortness of breath.  He was  admitted.  He did require some IV diuresis though his renal  insufficiency did not allow [place] to do this very aggressively.  We did  plan cardioversion;  however, he had not been on therapeutic Coumadin,  so he had to have a TEE.  This demonstrated some spontaenous contrast in  the left atrium suggestive of high risk cardioversion.  Therefore, he  was not cardioverted.  Of note, his INR was slightly subtherapeutic on  presentation.  He did have INR above 2.3 on September 16, 2008, and it has  been therapeutic since then thus far.   The patient returns for followup.  He has been doing okay.  He has not  been as short of breath as he was on the admission, though he is not  back to baseline.  He has had some increased low extremity swelling.  He  is watching his salt, but does not weigh himself daily.  He is currently  not taking diuretics because of his renal insufficiency.  He has had no  presyncope or syncope.  He has had none of the chest discomfort that he  had on admission.  He has had no chest pressure, neck, or arm  discomfort.   PAST MEDICAL HISTORY:  Coronary artery disease (status post CABG, LIMA  to the LAD, SVG to first diagonal, SVG to intermediate, first SVG to  obtuse marginal, SVG to PDA in 2007.  He had a PCI to an obtuse  marginal  lesion that was after the anastomosis of the vein graft.  This was  December 2008 with the 2.25 Taxus stent), cardiomyopathy (EF about 25%  at the time of surgery, but improved to 55% in 2008),  gastroesophageal  reflux disease, hypertension, chronic renal insufficiency, atrial  fibrillation, left frontal CVA, chronic anemia, and abnormal sleep study  (leg jerking).   ALLERGIES/INTOLERANCES:  ACE INHIBITOR and ARB.   MEDICATIONS:  1. Finasteride 5 mg daily.  2. Levothyroxine 100 mcg daily.  3. Omeprazole 20 mg daily.  4. Plavix 75 mg daily.  5. Fenofibrate 54 mg daily.  6. Carvedilol 3.125 mg daily.  7. Simvastatin 20 mg daily.  8. Zantac.  9. Coumadin.  10.Aspirin 81 mg daily.   REVIEW OF SYSTEMS:  As stated in the HPI and positive for apparent  change though with respirations noted by his wife.  Positive for  fatigue.  Negative for all other  systems.   PHYSICAL EXAMINATION:  GENERAL:  The patient is pleasant and in no  distress.  VITAL SIGNS:  Blood pressure 118/60, heart rate 80 and regular, weight  160 pounds, and body mass index 23.  HEENT:  Eyelids unremarkable, pupils equal, round, and reactive to  light, fundi not visualized, oral mucosa unremarkable.  NECK:  Jugular venous distention about 12 cm at 45 degrees, carotid  upstroke brisk and symmetric, no bruits, no thyromegaly.  LYMPHATICS:  No cervical, axillary, or inguinal adenopathy.  LUNGS:  Clear to auscultation with decreased breath sounds, no wheezing,  or crackles.  BACK:  No costovertebral angle tenderness.  CHEST:  Well healed sternotomy scar.  HEART:  PMI not displaced or sustained; S1 and S2 within normal limits;  no S3, no murmurs, clicks, rubs.  ABDOMEN:  Flat, positive bowel sounds, normal in frequency and pitch; no  bruits, no rebound, no guarding; no midline pulsatile mass, no  hepatomegaly, no splenomegaly.  SKIN:  No rashes, no nodules.  EXTREMITIES:  Upper pulse 2+, 1+ dorsalis pedis  and posterior tibialis  bilaterally, moderate bilateral ankle edema.  NEURO:  Oriented to person, place, and time; cranial nerves II through  XII grossly intact; motor grossly intact.   EKG:  Atrial fibrillation, rate 80, right axis deviation, right bundle  branch-block, no significant change from previous.   ASSESSMENT AND PLAN:  1. Atrial fibrillation.  I do plan to cardiovert him after he has been      on therapeutic INR for about 4 weeks.  He will be followed weekly      in our Coumadin Clinic and we will arrange this.  Hopefully, this      will take place in the next 2 weeks.  2. Heart failure.  He has accumulated some fluid again.  His ejection      fraction is about 45% by TEE.  I have asked him to keep his feet      elevated.  I am going to give him Lasix 40 mg a day for the next 3      days and then see how his weight is doing in his fluid.  We will      need to follow him closely in the office.  3. Renal insufficiency.  I will check a BMET today and again at least      when I see him back next week.  4. Coronary disease.  He is having no chest pain.  No further      cardiovascular testing is suggested.  He will continue with risk      reduction.  5. Anemia.  His hemoglobin was 10.4 which is down slightly through his      hospitalization.  However, he has had some chronic anemia.  6. Hypothyroidism.  He remains on the meds as listed.  7. Followup.  I will see him back in 1 week to further follow his      volume issues.     Rollene Rotunda, MD, [facility]  Electronically Signed    JH/MedQ  DD: 10/04/2008  DT: 10/05/2008  Job #: [id number]   cc:   Katha Hamming, PA

## 2011-01-09 NOTE — Assessment & Plan Note (Signed)
[facility]                            CARDIOLOGY OFFICE NOTE   CHADDRICK, BRUE                      MRN:          [id number]  DATE:09/04/2007                            DOB:          October 31, 1919    PRIMARY CARE PHYSICIAN:  Sharlot Gowda, M.D.   REASON FOR VISIT:  Evaluate patient with recent hospitalization for  unstable angina.   HISTORY OF PRESENT ILLNESS:  The patient is a very pleasant 77 year old  gentleman.  He was recently hospitalized with chest discomfort.  He had  a cardiac catheterization by Bruce R. Juanda Chance, MD, [facility]  This  demonstrated three-vessel coronary artery disease with saphenous vein  graft to PDA, saphenous vein graft to diagonal, saphenous vein graft to  obtuse marginal, LIMA to the LAD which were all patent.  However, he had  a 90% obtuse marginal stenosis.  He had Taxus stenting through the  saphenous vein graft to the distal obtuse marginal.  He subsequently did  well.  He has since had no further cardiovascular complaints.  In  particular he has not been having any chest discomfort, neck or arm  discomfort.  He has been having no palpitations, presyncope, or syncope.  He has not been having PND or orthopnea.  He has not gotten back to his  previous activity level yet.   PAST MEDICAL HISTORY:  Congestive heart failure secondary to ischemic  cardiomyopathy (EF had been 24%, it was most recently 55% on echo),  coronary artery disease as described, gastroesophageal reflux disease,  hypertension, chronic renal insufficiency, paroxysmal atrial  fibrillation, left frontal CVA, anemia, abnormal sleep study.   ALLERGIES:  ACE INHIBITOR and ARBs have been held because of his renal  insufficiency.   MEDICATIONS:  1. Tylenol.  2. Synthroid 125 mcg daily.  3. Proscar 5 mg daily.  4. Nexium 40 mg daily.  5. Fenofibrate 54 mg daily.  6. Zegerid 40/1100.  7. Lipitor 20 mg daily.  8. Carvedilol 3.125 mg b.i.d.  9. Iron.  10.Plavix 75 mg daily.  11.Aspirin 325 mg daily.   REVIEW OF SYSTEMS:  As stated in the HPI and otherwise negative for  other systems.   PHYSICAL EXAMINATION:  GENERAL:  The patient is frail appearing, but in  no distress.  VITAL SIGNS:  Blood pressure 105/73, heart rate 67 and regular, weight  157 pounds, body mass index 23.  HEENT:  Eye lids unremarkable, pupils equal, round, and reactive to  light, fundi not visualized.  Oral mucosa unremarkable.  NECK:  No jugular venous distention to 45 degrees.  Carotid upstroke  brisk and symmetric.  No bruits and no thyromegaly.  LYMPHATICS:  No cervical, axillary, or inguinal adenopathy.  LUNGS:  Clear to auscultation bilaterally.  BACK:  No costovertebral angle tenderness.  CHEST:  Well-healed sternotomy scar.  HEART:  PMI not displaced or sustained.  S1 and S2 within normal limits.  No S3, no S4.  No clicks, rubs, or murmurs.  ABDOMEN:  Flat, positive bowel sounds normal in frequency and pitch.  No  bruits, no rebound, no guarding, no midline pulsatile mass,  no  hepatomegaly, and no splenomegaly.  SKIN:  No rashes, no nodules.  EXTREMITIES:  2+ upper pulses, 1+ dorsalis pedis and posterior tibialis  bilaterally.  Trace edema.  No cyanosis or clubbing.  NEUROLOGY:  Grossly intact.   ASSESSMENT:  1. Coronary artery disease.  The patient is having symptoms related to      this.  No further cardiovascular testing is suggested.  He will      continue with Plavix for his drug eluting stent.  2. Cardiomyopathy.  The patient seems to have reasonable volume at      this point.  He is not having any overt symptoms.  He will continue      with medications as listed.  3. Renal insufficiency.  He canceled appointment with Cecille Aver, M.D. because of his hospitalization.  He will      reschedule this.  4. Abnormal sleep study.  He cancelled an appointment with Barbaraann Share, MD,FCCP, but will reschedule this.  5. Previous  cerebrovascular accident.  The patient is no longer on      Aggrenox because he is on Plavix.  He will continue with this.   FOLLOWUP:  I will see the patient back in about six weeks or sooner if  needed.     Rollene Rotunda, MD, [facility]  Electronically Signed    JH/MedQ  DD: 09/04/2007  DT: 09/04/2007  Job #: [id number]   cc:   Sharlot Gowda, M.D.

## 2011-01-09 NOTE — Assessment & Plan Note (Signed)
[facility]                          CHRONIC HEART FAILURE NOTE   [name], [name]                      MRN:          [id number]  DATE:02/25/2007                            DOB:          10/14/1919    PRIMARY CARDIOLOGIST:  Dr. Antoine Poche.   PRIMARY CARE:  The patient is followed by Elpidio Galea, PA with Dr. Sharlot Gowda.   [name] returns today for followup of his congestive heart failure  with etiology not quite clear at this time.  He does have a known  history of coronary artery disease, status post coronary artery bypass  grafting in 2007.  Previous stress Myoview in May of this year showed an  EF of 24%.  Stress Myoview showed prior inferolateral infarct and mild  peri-infarct ischemia; however, repeat echocardiogram done just 2 weeks  ago shows an EF back to 50% to 55%.  [name] is also being worked up  for anemia by Elpidio Galea, PA with Dr. Susann Givens and is pending possible  blood transfusion in regards to this.  He also just recently underwent  what sounds like an apneic link ordered by Dr. Pearlean Brownie with Neurology and  was referred for a sleep study for abnormal results; however, the  patient experienced extreme heartburn and was started on medication for  that and is pending further followup for a sleep study for questionable  sleep apnea.  [name] states he has been doing quite well.  I have  actually spoken with his wife by phone on a couple of occasions since I  initially saw them in regards to increased weakness and hypotension.  I  spoke with Ms. Bickert on 2 different occasions; the first occasion they  had been walking in the [employer] store; it was afternoon.  [name] had  not eaten lunch.  When he returned home, he became very weak and blood  pressure was mildly hypotensive.  I had Ms. Amsden get him comfortable in  the recliner, get some food and liquids in him and the blood pressure  stabilized and [name] states he was fine  after that.  The second  episode occurred after he decided he needed to do some plumbing in the  bathroom and he was trying to fix a device on the toilet.  Apparently,  he stood up and squatted down several times and became lightheaded and  dizzy.  Once again, his blood pressure had dropped.  He experienced the  same symptoms.  Once again, I had her help him to a recliner and put his  feet up and symptoms resolved.  Blood pressure has been stable since  then.  The only time they check his blood pressure is when he feels bad  and he has not had any further episodes since those 2 occasions.  Weight  has been stable at home.  He denies any chest discomfort.  No further  episodes of heartburn on medication.  Fatigue, which is not more than  usual.  They have been trying to do some walking in the stores; again,  he becomes mildly  dyspnea on exertion, otherwise doing well.   PAST MEDICAL HISTORY:  1. Congestive heart failure secondary to most likely ischemic      cardiomyopathy with the EF previously 24% by stress Myoview, 2-D      echocardiogram now showing an EF of 50% to 55%.  2. Coronary artery disease, status post CABG in 2007.  3. GERD, requiring Nexium and Zegerid for improved symptoms.  4. Hypertension.  5. Renal insufficiency.  6. Brief period of atrial fibrillation, status post CABG.  7. Left frontal lobe cerebrovascular accident, followed by Dr. Sandria Manly.  8. History of normocytic anemia, pending possible transfusion.  9. Questionable obstructive sleep apnea, pending further evaluation.   REVIEW OF SYSTEMS:  As stated above.   CURRENT MEDICATIONS:  1. Carvedilol 3.125 mg b.i.d.  2. Aggrenox 200/25 mg b.i.d.  3. Tylenol twice daily.  4. Synthroid 125 mcg daily.  5. Prozac 5 mg daily.  6. Iron 150 mg b.i.d.  7. Nexium daily.  8. Fenofibrate 54 mg daily.  9. Zegerid 40/1100 daily.  10.Lipitor 20 mg daily.   ALLERGIES:  No known drug allergies.   PHYSICAL EXAMINATION:  Weight  169 pounds.  Blood pressure 107/48 by  Dinamap, manual blood pressure 92/60.  [name] is in no acute distress, a very pleasant gentleman, ambulating  in clinic with the assistance of a cane.  No jugulovenous distention at 45-degree angle.  LUNGS:  Clear to auscultation.  CARDIOVASCULAR:  Exam reveals an S1 and S2, occasional irregular beat.  ABDOMEN:  Soft and nontender.  Positive bowel sounds.  LOWER EXTREMITIES:  Without clubbing, cyanosis, or edema.  NEUROLOGIC:  He is alert and oriented x3.   IMPRESSION:  Heart failure secondary to ischemic cardiomyopathy with an  ejection fraction previously of 24% by stress echocardiogram, currently  50% to 55%, patient tolerating initiation of low-dose beta blocker  therapy.  Will continue at current dose for now, 3.125 mg b.i.d. in the  setting of anemia, pending possible blood transfusion, also pending  further workup of questionable sleep apnea, patient okay to proceed with  sleep study and we will follow up with the patient after he sees Elpidio Galea and I have left a message with Annabelle Harman at [facility], given okay to proceed with sleep study if the patient's heart  failure is stable at this time.      Dorian Pod, ACNP  Electronically Signed      Rollene Rotunda, MD, [facility]  Electronically Signed   MB/MedQ  DD: 02/25/2007  DT: 02/26/2007  Job #: [id number]   cc:   [facility]  c/o Sharlot Gowda, MD Elpidio Galea PA

## 2011-01-09 NOTE — Assessment & Plan Note (Signed)
[facility]                            CARDIOLOGY OFFICE NOTE   [name], [name]                      MRN:          [id number]  DATE:10/17/2007                            DOB:          11-Apr-1920    PRIMARY:  Dr. Sharlot Gowda.   REASON FOR PRESENTATION:  Evaluate patient with chest discomfort.   HISTORY OF PRESENT ILLNESS:  The patient is a pleasant 77 year old  gentleman who was at cardiac rehab last Friday.  He had apparently some  PVCs.  The staff stopped him to see if he was feeling these.  He said he  felt a little burning under his sternum.  This went away spontaneously.  He did have another episode later that night and took a nitroglycerin.  These were mild.  However, they were somewhat reminiscent of discomfort  he had in December when he had angioplasty.  He since has had no further  discomfort.  He has not been feeling any palpitation, presyncope or  syncope.  He has been having no chest pressure, neck or arm discomfort.  He has been having no shortness of breath, PND or orthopnea.  He has not  yet returned a cardiac rehab.   PAST MEDICAL HISTORY:  Congestive heart failure secondary to ischemic  cardiomyopathy (EF had been 24%.  Most recently was 55% on echo),  coronary artery disease (status post CABG, August 2007 LIMA to the LAD,  SVG to first diagonal, ramus intermediate, obtuse marginal, and PDA.  Status post PCI of a 90% obtuse marginal lesion following the  anastomosis of vein graft.  This was managed with a 2.25 TAXUS stent.),  gastroesophageal reflux disease, hypertension, chronic renal  insufficiency, paroxysmal atrial fibrillation, left frontal CVA, anemia,  abnormal sleep study.   ALLERGIES:  ACE INHIBITOR, ARB caused renal insufficiency.   MEDICATIONS:  Synthroid and 125 mcg daily, Proscar 5 mg daily,  fenofibrate 54 mg daily, Zegerid 40/1100 daily, carvedilol 3.125 mg  daily, iron, Plavix 75 mg daily, aspirin 325 mg  daily, simvastatin 20 mg  daily (this was substituted by the [place].  He was on 20 mg of Lipitor),  omeprazole.   REVIEW OF SYSTEMS:  As stated in the HPI, otherwise negative for other  systems.   PHYSICAL EXAMINATION:  The patient is in no distress.  The blood pressure 114/55, heart rate 79 and regular.  HEENT:  Eyelids unremarkable.  Pupils are equal, round, and reactive to  light and accommodation.  Fundi are not visualized.  Oral mucosa  unremarkable.  NECK:  No jugular venous distension at 45 degrees, carotid upstroke  brisk and symmetric, left carotid bruit.  No thyromegaly.  LYMPHATICS:  No cervical, axillary, or inguinal adenopathy.  LUNGS:  Clear to auscultation bilaterally.  BACK:  No costovertebral angle tenderness.  CHEST:  Well-healed sternotomy scar.  HEART:  PMI not displaced or sustained, S1 and S2 within normal limits,  no S3, no S4, no clicks, rubs, murmurs.  ABDOMEN:  Flat, positive bowel sounds, normal in frequency and pitch, no  bruits, rebound, guarding.  No midline pulsatile mass, hepatomegaly,  splenomegaly.  SKIN:  No rashes, no nodules.  EXTREMITIES:  With 2+ upper pulses, 1+ dorsalis pedis and posterior  tibialis bilaterally.  Trace bilateral lower extremity edema.  No  cyanosis, clubbing.  NEURO:  Oriented to person, place, and time, cranial nerves 2-12 grossly  intact, motor grossly intact.    EKG sinus rhythm, rate 73, axis within normal limits, intervals within  normal limits, premature ectopic complex, lateral T-wave inversions  unchanged from previous.   ASSESSMENT/PLAN:  1. Chest discomfort.  The patient did have some chest discomfort.  He      had a small marginal branch that was treated in the past with      stenting.  At this point he has had 1 day with 2 episodes of pain      and otherwise has been okay.  He is somewhat anxious.  At this      point I would not suggest further cardiovascular evaluation for      these symptoms but would  continue to manage him medically.  I am      going to call rehab and encourage that they continue to work with      him and he will restart rehab on Monday.  If he has accelerated      symptoms, we will would need to consider further stress testing or      catheterization.  2. Cardiomyopathy.  He has had much improvement in his ejection      fraction.  Will continue the medications as listed though I would      like to try to titrate his meds eventually.  I would do this      gingerly as he gets symptomatic with med adjustments.  3. Renal insufficiency.  He is seen Dr. Kathrene Bongo.  Creatinine was      1.9 in January.  4. Abnormal sleep study.  He was to follow with Dr. Shelle Iron.  5. Previous cerebrovascular accident.  The patient is on Plavix and      aspirin and so is not taking any Aggrenox or Coumadin at this      point.  6. Follow-up.  He has a follow-up scheduled in March.     Rollene Rotunda, MD, [facility]  Electronically Signed    JH/MedQ  DD: 10/17/2007  DT: 10/18/2007  Job #: [id number]   cc:   Sharlot Gowda, M.D.

## 2011-01-09 NOTE — Procedures (Signed)
NAME:  [name], [name]               ACCOUNT NO.:  [id number]   MEDICAL RECORD NO.:  [id number]          PATIENT TYPE:  OUT   LOCATION:  SLEEP CENTER                 FACILITY:  [facility]   PHYSICIAN:  Barbaraann Share, MD,FCCPDATE OF BIRTH:  1921-12-07   DATE OF STUDY:                            NOCTURNAL POLYSOMNOGRAM   REFERRING PHYSICIAN:   LOCATION:  Sleep lab.   REFERRING PHYSICIAN:  Barbaraann Share, MD.   INDICATION FOR STUDY:  Hypersomnia with sleep apnea.   EPWORTH SLEEPINESS SCORE:  12.   SLEEP ARCHITECTURE:  The patient had total sleep time of 247 minutes  with very little slow wave sleep or REM.  Sleep onset latency was mildly  prolonged at 41 minutes and REM onset was prolonged at 139 minutes.  Sleep efficiency was very poor at 68%.   RESPIRATORY DATA:  The patient was found to have no obstructive apneas  or hypopneas.  No respiratory effort related arousals were noted as  well.  However, the patient did have very little slow wave sleep or REM,  and therefore his degree of sleep apnea may be under estimated.   OXYGEN DATA:  There was O2 desaturation as low as 90% that was transient  in nature and spontaneous.   CARDIAC DATA:  Frequent PVCs were noted throughout.   MOVEMENT-PARASOMNIA:  The patient was found to have 11 leg jerks with  only 1 resulting in arousal or awakening.   IMPRESSIONS-RECOMMENDATIONS:  1. No evidence for clinically significant sleep apnea.  The patient      did have very little slow wave sleep or REM.      However, he had more than ample opportunity to exhibit sleep      disordered breathing.  2. Frequent PVCs noted throughout.      Barbaraann Share, MD,FCCP  Diplomate, American Board of Sleep  Medicine  Electronically Signed     KMC/MEDQ  D:  08/14/2007 08:39:47  T:  08/14/2007 14:23:00  Job:  [id number]

## 2011-01-09 NOTE — Assessment & Plan Note (Signed)
[facility]   NAME:  DUDLEY, MAGES               ACCOUNT NO.:  [id number]   MEDICAL RECORD NO.:  [id number]      DATE OF BIRTH:  15-Jun-1920   PHYSICIAN:  Bimal R. Sherryll Burger, MD            VISIT DATE:                                   OFFICE VISIT   ADMISSION H AND P   CHIEF COMPLAINT:  Chest pain.   HISTORY OF PRESENT ILLNESS:  This is an 77 year old gentleman with a  history of coronary disease status post CABG, hypertension,  hyperlipidemia, GERD who comes in with 5 out of 10 epigastric chest pain  that he first felt around 2:30 this morning while sitting reading  shortly after he had a snack.  He states that the pain radiated to his  back and initially he thought it was indigestion or GERD, however, he  had no relief with Tums acutely.  As a result he took 3 sublingual  nitroglycerins as directed without any further relief of his symptoms.  He does state that the nitroglycerin was over 75 years old.  At that  point given his concern for these symptoms he called EMS.  On EMS he had  additional nitroglycerin with a reduction of his chest pain to a 3 out  of 10 which it is now when I examined the patient.  He denies any other  associated symptoms such as nausea, vomiting, diaphoresis or  palpitations.  In the emergency department he was found to have a  positive troponin.   The patient is fairly active and denies any lower extremity edema,  orthopnea or PND.  He denies any exertional chest discomfort since his  bypass.  His last cath was prior to his bypass.  Otherwise, he is  without any significant complaints.   REVIEW OF SYSTEMS:  As above in the HPI.  Remaining 18-point review of  systems is negative.   ALLERGIES:  PENICILLIN.   PAST MEDICAL HISTORY:  1. Coronary artery disease status post 5-vessel CABG in August of 2007      in the setting of an acute MI.  2. Question of ischemic cardiomyopathy, now resolved after the CABG.  3. Postoperative atrial  fibrillation after his CABG, now resolved.  4. GERD.  5. Hypertension.  6. Chronic kidney disease with a baseline creatinine of 1.6.  7. History of a left frontal CVA.  8. History of a TIA.  9. BPH.  10.Status post laparoscopic cholecystectomy.  11.Bilateral carotid stenosis with a left-sided stenosis 40% to 60%      and a right-sided stenosis of 40% to 60%.  12.Glucose intolerance.   CURRENT MEDICATIONS:  1. Aggrenox 1 tablet b.i.d.  2. Synthroid 0.125 mg daily.  3. Proscar 5 mg daily.  4. Iron sulfate 325 mg b.i.d.  5. Nexium 40 mg daily.  6. Fenofibrate 54 mg daily.  7. Lipitor 20 mg daily.  8. Carvedilol 3.125 mg daily, any further titration of his beta      blocker causes significant symptoms.  9. Zegerid 20 mg daily.   SOCIAL HISTORY:  He has no habits.  He lives with his wife in  [place].  He is retired.   FAMILY HISTORY:  Noncontributory.  PHYSICAL EXAM:  Blood pressure of 154/78, pulse of 75, temperature of  98.4, respiratory rate of 20, O2 saturation 100% on 2 liters nasal  cannula.  IN GENERAL:  He is alert and oriented x3, no acute distress.  He is  accompanied by both his daughter and his wife.  HEENT EXAM:  Neck is supple with no lymphadenopathy.  No carotid bruits,  2+ carotid impulses.  His JVP is flat.  Sclerae anicteric.  Oropharynx  is clear.  Pupils equal, round, reactive to light.  Lungs are with  bibasilar crackles that do not clear with coughing.  Otherwise, with  clear airway movement.  No rhonchi or wheezes.  CARDIOVASCULAR EXAM:  Regular rate and rhythm.  Normal S1, S2 without  any murmurs, rubs or gallops.  He has a well-healed median sternotomy  wound site.  ABDOMEN:  Positive bowel sounds.  Soft, nontender, nondistended without  any palpable masses.  I could not palpate his liver or spleen.  EXTREMITY EXAM:  2+ pulses.  No lower extremity edema, clubbing or  cyanosis.  NEUROLOGIC EXAM:  Grossly nonfocal.   LABS:  Chest x-ray shows some  mild pulmonary edema, but no effusions.  EKG shows a normal sinus rhythm at a rate of 83 with a rightward axis  and an incomplete right bundle branch block but no acute ischemic  changes.  Hematocrit of 31, platelets of 180, potassium 4.6, bicarb 25,  creatinine is up to 2, glucose of 176.  LFTs are within normal limits.   ASSESSMENT:  1. Non-ST elevation MI in a gentleman with a prior history of coronary      artery disease.  2. Hypertension.  3. Hyperlipidemia.  4. Peripheral vascular disease.   PLAN:  Patient will be admitted to the CCU.  We will cycle cardiac  enzymes.  He has been started on heparin here in the emergency  department as well as the nitroglycerin for his ongoing chest pain.  He  has an elevated creatinine from his baseline here but he is with some  slight pulmonary edema.  I am not giving him any fluids or Lasix at this  point.  We will continue to follow him given that he is not in extremis.  We will place him on insulin sliding scale given his elevated glucose  here on admission.  He will also have a hemoglobin A1c and a fasting  lipid panel.  He will be continuing on his outpatient medications and  his care will be further dictated when he is seen shortly by the [facility].           ______________________________  Eston Esters. Sherryll Burger, MD    BRS/MEDQ  D:  08/23/2007  T:  08/23/2007  Job:  [id number]

## 2011-01-09 NOTE — Assessment & Plan Note (Signed)
[facility]                          CHRONIC HEART FAILURE NOTE   [name], [name]                      MRN:          [id number]  DATE:06/17/2007                            DOB:          01-May-1920    PRIMARY CARDIOLOGIST:  Rollene Rotunda, MD.   PRIMARY CARE PHYSICIAN:  Elpidio Galea, PA with Dr. Susann Givens.   [name] returns today for followup of his congestive heart failure.  Etiology not quite clear, most likely ischemic in nature. [name]  states he has been doing quite well. Elpidio Galea follows him closely in  regards to a recent anemia workup. [name] is status post blood  transfusion. He states he saw Selena Batten last week, blood work was drawn and he  has a followup appointment with her. Results of blood work not readily  available at this time. [name] is also being followed by Dr. Annie Sable for some renal insufficiency. Dr. Kathrene Bongo felt the  patient's renal perfusion was being impaired secondary to hypotension in  the setting of aggressive heart failure management with beta blocker  therapy. She recommended cutting the carvedilol back to 3.125 mg daily.  We have done that and the patient's blood pressure remains stable.  Systolic 90s to 100s, diastolic 50s to 60s. The patient denies any  shortness of breath, orthopnea, PND, lightheadedness, dizziness,  presyncope or syncopal episodes. He continues to walk daily at the  [employer] with his wife without experiencing any side effects  secondary to exertion. I have asked him to pace himself and to continue  with his activity. Overall, he states he is feeling very good today and  is pleased with his quality of life.   PAST MEDICAL HISTORY:  1. Congestive heart failure secondary to questionable ischemic      cardiomyopathy with EF previously 24% and now improved to 55%.  2. Coronary artery disease status post CABG.  3. GERD requiring Nexium and Zegerid.  4. History of  hypertension.  5. Renal insufficiency.  6. Paroxysmal atrial fibrillation, remote history.  7. Left frontal lobe CVA followed by Dr. Sandria Manly.  8. Normocytic anemia status post recent blood transfusion.  9. Abnormal apneic link pending sleep study.  10.Intolerance to further beta blocker titration secondary to      decreased renal perfusion.   REVIEW OF SYSTEMS:  As stated above.   CURRENT MEDICATIONS:  1. Aggrenox 200/25 b.i.d.  2. Synthroid 125 mcg daily.  3. Tylenol p.r.n.  4. Proscar 5 mg daily.  5. Iron 150 mg b.i.d.  6. Nexium 40 mg daily.  7. Fenofibrate 54 mg daily.  8. Zegerid 40/110 daily.  9. Lipitor 20 mg daily.  10.Carvedilol 3.125 mg daily.   PHYSICAL EXAMINATION:  Weight 165, blood pressure 106/50 with a heart  rate of 73. The patient satting 98% on room air.  [name] is in no acute distress. He is his usual pleasant self.  No signs of jugular vein distention at 45-degree angle.  LUNGS:  Clear to auscultation bilaterally.  CARDIOVASCULAR:  Reveals S1 and S2.  ABDOMEN:  Soft, nontender, positive  bowel sounds.  LOWER EXTREMITIES:  Without clubbing, cyanosis or edema.  NEUROLOGIC:  The patient alert and oriented x3.   IMPRESSION:  Cardiomyopathy with an ejection fraction improved from 24%  now at 55% by echocardiogram. No further plans to titrate beta blocker  therapy secondary to decreased renal perfusion/hypotension. Also avoid  an ACE inhibitor secondary to patient's extreme reflux otherwise  continue current plan. I will await result of blood work obtained by Elpidio Galea last week and see patient back after Thanksgiving.      Dorian Pod, ACNP  Electronically Signed      Bevelyn Buckles. Bensimhon, MD  Electronically Signed   MB/MedQ  DD: 06/17/2007  DT: 06/18/2007  Job #: [id number]   cc:   Elpidio Galea, PA

## 2011-01-09 NOTE — Consult Note (Signed)
NAME:  HOOKSPreet, Perrier               ACCOUNT NO.:  [id number]   MEDICAL RECORD NO.:  [id number]          PATIENT TYPE:  INP   LOCATION:  4733                         FACILITY:  [facility]   PHYSICIAN:  Peter C. Eden Emms, MD, FACCDATE OF BIRTH:  1920/07/13   DATE OF CONSULTATION:  DATE OF DISCHARGE:                                 CONSULTATION   INDICATIONS:  An 77 year old with heart failure and atrial fibrillation.  TEE was done to rule out clot prior to cardioversion.   The patient was sedated with 75 mcg of fentanyl and 2 mg of Versed.  At  the end of the case, he required Romazicon and 250 mL of saline due to  his sedation and low blood pressure.   The patient otherwise tolerated the procedure well.   Using digital technique, an Omniplane probe advanced into the distal  esophagus without incident.   Transgastric imaging was suboptimal.  The left ventricular cavity size  appeared mildly dilated with mild LVH, EF was in the 45% range with more  global-looking hypokinesis.   There was mild mitral insufficiency.  There was mild biatrial  enlargement.  Aortic valve was trileaflet with mild aortic  insufficiency.  There was no ASD or PFO.   There was significant spontaneous contrast in the left atrial appendage.  There appeared to be a clot in the orifice of the left atrial appendage.  This was seen primarily on 0 degree frontal views.  I could not make it  go away changing depth and contrast settings.   Imaging of the aorta showed no significant debris.   FINAL IMPRESSION:  1. Likely left atrial appendage clot  with spontaneous contrast.      Cardioversion should likely be delayed for 3 weeks of therapeutic      anticoagulation.  2. Mild mitral regurgitation.  3. Mild aortic insufficiency.  4. Left ventricular enlargement, ejection fraction 45%.   The patient will be continued on heparin until his Coumadin is  therapeutic.      Noralyn Pick. Eden Emms, MD, [facility]  Electronically  Signed     PCN/MEDQ  D:  09/15/2008  T:  09/15/2008  Job:  [id number]

## 2011-01-09 NOTE — Discharge Summary (Signed)
NAMEMARTAVIS, GURNEY NO.:  [id number]   MEDICAL RECORD NO.:  [id number]          PATIENT TYPE:  INP   LOCATION:  4742                         FACILITY:  [facility]   PHYSICIAN:  Rollene Rotunda, MD, FACCDATE OF BIRTH:  1920/01/06   DATE OF ADMISSION:  01/06/2007  DATE OF DISCHARGE:  01/07/2007                               DISCHARGE SUMMARY   SUMMARY OF HISTORY:  Mr. [name] is an 77 year old white male who was  awakened around 6:30 on the morning of admission with mild to moderate  epigastric discomfort which he described as persistent, sore, and  painful.  It radiated to the upper back and bilateral shoulders. His  discomfort was relieved by sitting up in bed and drinking a Coke with  belching. Discomfort continued throughout the morning. He is not sure if  the discomfort was similar to his MI in 2007; however, EMS was called  after his discomfort was not relieved with two sublingual nitroglycerin.   PAST MEDICAL HISTORY:  Is notable for:  1. Non-Q-wave MI in August 2007 status post five-vessel bypass surgery      in August 2007 with EF of 45-50% by echocardiogram February 2008.  2. Postoperative atrial fibrillation.  3. Hypertension.  4. Hyperlipidemia.  5. Hypothyroidism.  6. Left frontal CVA in February 2008.  7. TIA.  8. Bilateral carotid artery stenosis.  9. Chronic renal insufficiency.  10.Normocytic anemia.  11.Impaired glucose tolerance.  12.BPH.  13.Status post laparoscopic cholecystectomy.  14.GERD.  15.Chronic gastritis.   LABORATORY:  Chest x-ray on May 12 showed COPD without acute  abnormality.   Admission weight was 167.4.  Admission hemoglobin 10.1, hematocrit 30.1,  normal indices, platelets 153, WBC 7.6. PTT 32, PT 14.9. Sodium 138,  potassium 3.9, BUN 22, creatinine 1.43. Lipase 17.  CK-MB, relative  index, and troponins were within normal limits x3.  TSH was 1.850 with a  free T4 1.36.   EKGs showed normal sinus rhythm, normal axis,  nonspecific ST-T wave  changes.   HOSPITAL COURSE:  Mr. Girouard was admitted to [facility] for  further evaluation.  It was felt that his symptoms are more consistent  with a GI etiology.  GI was asked to consult.  Dr. Matthias Hughs evaluated the  patient on Jan 06, 2007. He felt that his symptoms were related to GERD  and recommended EGD once cleared by cardiology.  He had ruled out for  myocardial infarction.  Dr. Jenene Slicker note of Jan 07, 2007, stated that  the patient could be discharged home if okay with GI with a followup  Cardiolite in the office (adenosine).  Dr. Madilyn Fireman will follow up with the  patient as an outpatient and arrange EGD.  Dr. Matthias Hughs gave samples of  Zegerid, a proton pump inhibitor, that is best for nocturnal reflux to  take for the next week prior to his EGD.   DISCHARGE DIAGNOSES:  1. Abdominal discomfort, probable gastrointestinal and etiology.  2. Hyperglycemia with a history of glucose intolerance.  3. Normocytic anemia.  4. Non-Q-wave myocardial infarction in August 2007 status post five-  vessel bypass surgery in August 2007 with ejection fraction of 45-      50% by echocardiogram February 2008.  5. Postoperative atrial fibrillation.  6. Hypertension.  7. Hyperlipidemia.  8. Hypothyroidism.  9. Left frontal cerebrovascular accident in February 2008.  10.Transient ischemic attack.  11.Bilateral carotid artery stenosis.  12.Chronic renal insufficiency.  13.Normocytic anemia.  14.Impaired glucose tolerance.  15.Benign prostatic hypertrophy.  16.Status post laparoscopic cholecystectomy.  17.Gastroesophageal reflux disease.  18.Chronic gastritis.   DISPOSITION:  A nuclear medicine request form has been faxed to our  office.  They have been asked to call the patient with arrangements for  an adenosine Myoview before Friday of this week and a followup  appointment with Dr. Antoine Poche.  A message has been left at the office in  regards to this as the  patient's discharge was after hours.  He was  instructed the office will call him with these arrangements, and if they  have not called him by 12 o'clock Wednesday, he was asked to call the  office. He was asked to continue his Nexium 30 mg before breakfast,  Lipitor 20 mg nightly, Aggrenox two times a day, iron 50 mg b.i.d.,  Synthroid 125 mcg daily, nitroglycerin p.r.n. He also has the samples of  the Zegerid nightly from Dr. Matthias Hughs.   Discharge time: 35 minutes.      Joellyn Rued, PA-C      Rollene Rotunda, MD, [facility]  Electronically Signed    EW/MEDQ  D:  01/07/2007  T:  01/07/2007  Job:  [id number]   cc:   Everardo All. Madilyn Fireman, M.D.  Sharlot Gowda, M.D.

## 2011-01-09 NOTE — Assessment & Plan Note (Signed)
[facility]                            CARDIOLOGY OFFICE NOTE   CHRISTIE, COPLEY                      MRN:          [id number]  DATE:10/12/2008                            DOB:          [birth year]    PRIMARY CARE PHYSICIAN:  Logan Bores, PA   REASON FOR PRESENTATION:  Evaluate the patient with heart failure and  atrial fibrillation.   HISTORY OF PRESENT ILLNESS:  The patient returns for followup of the  above.  He has had significant problems with recent volume overload  probably related to his atrial fibrillation.  I have been waiting for  him to have therapeutic INRs in order to be able to cardiovert him.  He  has been very fatigued and this may be related to the fibrillation.  He  has had some increasing shortness of breath.  He takes his Lasix 40 mg  as needed.  He has had this about 3-4 times in the past week.  His  weights do fluctuate a couple of times.  He has had some increased lower  extremity swelling.  He watches his salt.  He has had no PND or  orthopnea.  He has had no chest discomfort, neck or arm discomfort.  He  does not really feel any palpitations.  He has had no presyncope or  syncope.   PAST MEDICAL HISTORY:  Coronary artery disease (status post CABG with  LIMA to the LAD, SVG to first diagonal, SVG to intermediate, SVG to  obtuse marginal, SVG to PDA in 2007.  He had a PCI to an obtuse marginal  lesion that was after the anastomosis of the vein graft insertion.  This  was in December 2008 with a 2.25 Taxus stent), cardiomyopathy (EF 25% at  the time of surgery, but improved to 55% in 2008), gastroesophageal  reflux disease, persistent atrial fibrillation (spontaneous contrast on  a recent TEE), hypertension, chronic renal insufficiency, left frontal  CVA, chronic anemia, abnormal sleep study, leg jerking.   ALLERGIES AND INTOLERANCES:  ACE INHIBITOR and ARB.   PHYSICAL EXAMINATION:  VITAL SIGNS:  Blood pressure  106/63, heart rate  80 and irregular, weight 170 pounds, body mass index 23.  HEENT:  Eyes unremarkable.  Pupils equal, round, and reactive to light.  Fundi not visualized.  Oral mucosa unremarkable.  NECK:  Jugular venous distention about 12 cm at 45 degrees.  Carotid  upstroke brisk and symmetric.  No bruits.  No thyromegaly.  LYMPHATICS:  No cervical, axillary, or inguinal adenopathy.  LUNGS:  Clear to auscultation bilaterally.  BACK:  No costovertebral angle tenderness.  CHEST:  Well healed sternotomy scar.  HEART:  PMI not displaced or sustained.  S1 and S2 within normal limits.  No S3, no clicks, no rubs, no murmurs.  ABDOMEN:  Flat.  Positive bowel sounds, normal in frequency and pitch.  No bruits, no rebound, no guarding.  No midline pulsatile mass.  No  hepatomegaly, no splenomegaly.  SKIN:  No rashes.  No nodules.  EXTREMITIES:  2+ upper pulses, 1+ dorsalis pedis and posterior tibialis  bilaterally, moderate bilateral  ankle edema.  NEUROLOGIC:  Oriented to person, place, and time.  Cranial nerves II  through XII grossly intact.  Motor grossly intact throughout.   I did review his Holter monitor which demonstrates atrial fibrillation.  His rate was reasonably well controlled.  He was in permanent fib.  This  was not paroxysmal.  There were PVCs with couplets.  This was on September 16, 2008.   ASSESSMENT AND PLAN:  1. I do think this might be contributing to his fibrillation.  He has      not been on therapeutic Coumadin verified today.  I will go ahead      and set him for an elective cardioversion.  He will remain on the      meds as listed.  2. Cardiomyopathy.  His ejection fraction most recently checked was      45%.  Hopefully, this will improve his sinus rhythm.  He will      continue the meds as listed.  I am worried about his volume today      and I will ask him to increase his Lasix by taking 40 mg every day      for the next 3 days.  We will get a BMET when he  comes back for his      cardioversion early next week.  3. Renal insufficiency.  We have to watch this carefully and again, I      will check a BMET when he comes back early next week.  His last      creatinine was 2.21, February 8.  The BUN was 41.  This was stable.  4. Coronary disease.  He is having no ongoing angina.  He will      continue with risk reduction.  5. Hypertension.  His blood pressure is well controlled.  He will      continue the meds as listed.     Rollene Rotunda, MD, [facility]  Electronically Signed    JH/MedQ  DD: 10/12/2008  DT: 10/13/2008  Job #: [id number]   cc:   Logan Bores, PA  Sharlot Gowda, M.D.

## 2011-01-09 NOTE — Op Note (Signed)
NAMEAMOS, MICHEALS NO.:  [id number]   MEDICAL RECORD NO.:  [id number]          PATIENT TYPE:  OIB   LOCATION:  2899                         FACILITY:  [facility]   PHYSICIAN:  Rollene Rotunda, MD, FACCDATE OF BIRTH:  1920/07/18   DATE OF PROCEDURE:  10/19/2008  DATE OF DISCHARGE:  10/19/2008                               OPERATIVE REPORT   PRIMARY CARE PHYSICIAN:  Elpidio Galea, PA with Dr. Nathaneil Canary.   PROCEDURE:  Direct-current cardioversion.   INDICATION:  To evaluate the patient with symptomatic atrial  fibrillation.   PROCEDURE NOTE:  Appropriate informed consent was obtained.  We did make  sure that the patient had therapeutic Coumadin levels and these have  been verified checked in our lab.  He was given anesthesia per Dr.  Jean Rosenthal with 75 mg of Pentothal.  He was appropriately monitored with  bag ventilation.  He received one cardioversion with 150 mg of biphasic  synchronized energy converting him from atrial fibrillation to sinus  rhythm with PACs.  There were no apparent complications.   PLAN:  The patient will continue the same medications.  I do note his  renal insufficiency on the BMET today, but this is unchanged.  I will  see the patient on October 28, 2008, at 12:15 in the office for followup.      Rollene Rotunda, MD, [facility]  Electronically Signed     JH/MEDQ  D:  10/19/2008  T:  10/19/2008  Job:  [id number]

## 2011-01-09 NOTE — Assessment & Plan Note (Signed)
[facility]                            CARDIOLOGY OFFICE NOTE   [name] [name]                      MRN:          [id number]  DATE:01/16/2007                            DOB:          Nov 23, 1919    PRIMARY CARE PHYSICIAN:  Sharlot Gowda, M.D.   REASON FOR PRESENTATION:  Evaluate patient with recent hospitalization  for chest pain.   HISTORY OF PRESENT ILLNESS:  The patient presents for followup of the  above. He was hospitalized on May 12,  with chest discomfort. However,  there was no objective evidence of ischemia. He was discharged and had a  stress perfusion study. This demonstrated no evidence of new infarct or  ischemia. He had a prior inferolateral infarct which was evident. There  was mild peri-infarct ischemia. His ejection fraction was thought to be  24%.   Since that procedure, the patient has been on Zegerid and actually has  had improvement in his chest discomfort. He is followed by Dr.  Dorena Cookey. There is a planned EGD eventually.   The patient is starting to feel slightly better though this was a set  back from his bypass. He is starting to do a little activity in his  house. He is going to join cardiac rehab in the next week. He is not  having any classic chest pressure, neck or arm discomfort. He has not  been having any palpitations, pre-syncope or syncope. He has not been  having any PND or orthopnea.   PAST MEDICAL HISTORY:  1. Coronary artery disease status post non-Q wave myocardial      infarction.  2. Coronary artery bypass graft (Left internal mammary artery (LIMA)      to the left anterior descending artery (LAD), saphenous vein graft      (SVG) to first diagonal, saphenous vein graft (SVG) to ramus      intermediate and obtuse marginal, saphenous vein graft (SVG) to      posterior descending artery (PDA).  3. Postoperative atrial fibrillation.  4. Gastroesophageal reflux disease.  5. Hypertension.  6.  Renal insufficiency.   ALLERGIES:  None.   MEDICATIONS:  1. Lipitor 20 mg daily.  2. Proscar 5 mg daily.  3. Aggrenox 25/200 b.i.d.  4. Tylenol.  5. Synthroid 125 mcg daily.  6. Nexium 40 mg q a.m.  7. Iron 150 mg b.i.d.  8. Zegerid.   REVIEW OF SYSTEMS:  As stated in the HPI and otherwise negative for  other systems.   PHYSICAL EXAMINATION:  The patient is in no distress. Blood pressure is  142/58, heart rate 72 and regular, weight 169 pounds.  HEENT: Eyelids unremarkable. Pupils equal, round, and reactive to light.  Fundi not visualized. Oral mucosa is unremarkable.  NECK: No jugular venous distention at 45 degrees. Carotid upstroke brisk  and symmetrical. No bruits, no thyromegaly.  LYMPHATICS: No cervical, axillary or inguinal adenopathy.  LUNGS: Clear to auscultation bilaterally.  BACK: No costovertebral angle tenderness.  CHEST: Well-healed sternotomy scar.  HEART: PMI not displaced or sustained. S1, S2 within normal limits. No  S3. No  S4. No clicks, rubs or murmurs.  ABDOMEN: Flat, positive bowel sounds, normal in frequency and pitch. No  bruits. No rebounds. No guarding. No midline pulsatile mass. No  organomegaly.  SKIN: No rashes, no nodules.  EXTREMITIES: 2+ upper pulses, 1+ dorsalis pedis and posterior tibialis  bilaterally. Trace edema. No cyanosis or clubbing.  NEURO: Grossly intact.   ASSESSMENT/PLAN:  1. Coronary disease: The patient does not have any active ischemia. No      further cardiovascular testing is suggested. He will continue with      secondary risk reduction.  2. Cardiomyopathy. The patient's ejection fraction is probably under      estimated by the nuclear. I am going to get an echocardiogram to      further evaluate this. I am going to begin to titrate his      medications beginning with Coreg 3.125 mg b.i.d. I am going to send      him to the [facility] Failure Clinic for medication titration. Of note,      I will avoid an ACE inhibitor with  his reflux. Eventually he might      be able to use an ARB, though will have to do this judiciously with      his renal insufficiency. Will keep him in mind as needed for      implantable defibrillator if his ejection fraction remains at or      below 35%.  3. GI: The patient is to see Dr.  Madilyn Fireman. Given his recent set back, he      was hoping to try to delay any upper endoscopy for a while. I think      it would be reasonable to continue medication titration for a      couple of months before proceeding with any further evaluation      unless it is deemed more urgent by Dr.  Madilyn Fireman.  4. Followup. He will be seen in a couple of weeks in the Heart Failure      Clinic and at the same time he will get his echo.     Rollene Rotunda, MD, [facility]  Electronically Signed    JH/MedQ  DD: 01/16/2007  DT: 01/16/2007  Job #: [id number]   cc:   Sharlot Gowda, M.D.  [name], M.D.

## 2011-01-09 NOTE — Assessment & Plan Note (Signed)
[facility]                          CHRONIC HEART FAILURE NOTE   [name], [name]                      MRN:          [id number]  DATE:07/17/2007                            DOB:          08/11/1920    PRIMARY CARDIOLOGIST:  Dr. Rollene Rotunda.   PRIMARY CARE:  The patient is followed by Feliberto Gottron, PA with Dr.  Susann Givens.   [name] returns today for followup of his congestive heart failure  secondary to questionable ischemic cardiomyopathy with EF previously 24,  now improved to 55% in the setting of known coronary artery disease  status post CABG in the past.  [name] states he has been doing quite  well.  Feliberto Gottron continues to follow him closely in regard to his  anemia.  He is status post blood transfusion in the last few months,  states he has an appointment on Tuesday with Selena Batten for followup.  He is  accompanied by his wife today.  He continues to walk almost on a daily  basis at [employer] store with his wife for exercise, denies any symptoms of  presyncope or syncopal episode, orthopnea, PND, or palpitations.  He is  status post an abnormal apneic link pending sleep study.  Apparently,  the apneic link was done through neurology.  The patient has never been  able to get scheduled for a sleep study.  His wife is requesting, if  possible, for me to go ahead and arrange a sleep study through [facility] at their request.  [name] also suffers from severe  heartburn that requires multiple medications.  He is pending also a  colonoscopy, as it has been 5 years since his last exam.  Overall, he  continues to do quite well.   PAST MEDICAL HISTORY:  1. Congestive heart failure secondary to questionable ischemic      cardiomyopathy with an EF previously 24% now improved to 55%.  2. Coronary artery disease status post CABG.  3. GERD requiring Nexium and Zegerid.  4. History of hypertension.  5. Chronic renal insufficiency.  The  patient is followed by Dr. Annie [name].  Insufficiency felt to be secondary to poor renal      perfusion in the setting of hypotension with aggressive heart      failure management with medication therapy.  6. Paroxysmal atrial fibrillation.  7. Left frontal lobe CVA followed by Dr. Sandria Manly.  8. Normocytic anemia, status post recent blood transfusion.  9. Abnormal apneic link pending a sleep study per the patient's      report.  10.Intolerance to further beta blocker titration secondary to      decreased renal perfusion.   REVIEW OF SYSTEMS:  As stated above.   CURRENT MEDICATIONS:  1. Aggrenox b.i.d.  2. Tylenol p.r.n.  3. Synthroid 125 mcg daily.  4. Proscar 5 mg daily.  5. Iron b.i.d.  6. Nexium 40 mg daily.  7. Fenofibrate 54 mg daily.  8. Zegerid daily.  9. Lipitor 20 daily.  10.Carvedilol 3.125 mg daily only.   PHYSICAL EXAM:  Weight 167 pounds, blood pressure 114/50, heart rate 72.  [name] is in no acute distress.  No signs of jugular vein distention at 45-degree angle.  LUNGS:  Clear to auscultation bilaterally.  CARDIOVASCULAR:  S1 and S2; regular rate and rhythm.  ABDOMEN:  Soft, nontender, positive bowel sounds.  LOWER EXTREMITIES:  Without clubbing, cyanosis, or edema.  NEUROLOGIC:  Alert and oriented x3.  Ambulating in clinic without  assistance.   IMPRESSION:  Cardiomyopathy with an ejection fraction of 55%.  Continue  current medications.  No further titration of beta blocker secondary to  decreased renal perfusion.  No ACE inhibitor secondary to the patient's  extreme reflux.  We will arrange for the patient to have sleep study  through our pulmonary office for further evaluation.      Dorian Pod, ACNP  Electronically Signed      Rollene Rotunda, MD, [facility]  Electronically Signed   MB/MedQ  DD: 07/17/2007  DT: 07/18/2007  Job #: [id number]   cc:   Feliberto Gottron, PA

## 2011-01-09 NOTE — Discharge Summary (Signed)
NAMEAMIEL, MCCAFFREY NO.:  [id number]   MEDICAL RECORD NO.:  [id number]          PATIENT TYPE:  INP   LOCATION:  2002                         FACILITY:  [facility]   PHYSICIAN:  Rollene Rotunda, MD, FACCDATE OF BIRTH:  August 05, 1920   DATE OF ADMISSION:  08/23/2007  DATE OF DISCHARGE:                         DISCHARGE SUMMARY - REFERRING   It is noted at the time of this dictation that history and physical  dictation done on the 27th is still pending.   HISTORY OF PRESENT ILLNESS:  The patient is a 77 year old white male  who presents to the emergency room with 5 out of 10 epigastric chest  discomfort that began around 2:30 a.m. while sitting reading, shortly  after he had a snack.  The discomfort radiated to his back and he  initially attributed it to indigestion.  However, he did not have any  relief with Tums.  He subsequently took three sublingual nitroglycerin  without relief, even though the nitroglycerin was over two years old,  thus he called EMS.  He received additional nitroglycerin which reduced  his symptoms.  He did not have any associated symptoms.   PAST MEDICAL HISTORY:  Past medical history is notable for:  Five vessel  bypass surgery in August 2007 in the setting of an acute myocardial  infarction.  Ischemic cardiomyopathy that has improved ejection  fraction.  Postoperative atrial fibrillation.  Gastroesophageal reflux  disease.  Hypertension.  Chronic kidney disease with a baseline  creatinine of 1.6.  Left frontal cerebrovascular accident.  Transient  ischemic attack.  Benign prostatic hypertrophy.  Laparoscopic  cholecystectomy.  Bilateral carotid stenosis.  Glucose intolerance.   LABORATORY DATA:  Admission hemoglobin and hematocrit were 10.4 and  31.0, normal indices, platelet count 180,000, white blood cell count  10,800.  At the time of discharge hemoglobin and hematocrit were 9.6 and  28.0, normal indices, platelet count 150,000, white blood  cell count  8,100.  Admission partial thromboplastin time was 31, prothrombin time  14.1.  Sodium 139, potassium 4.6, BUN 23, creatinine 1.96, glucose 176.  Normal liver function tests.  At the time of discharge sodium was 140,  potassium 4.4, BUN 30, creatinine 2.45, glucose 112.  Hemoglobin A1C on  the 27th was 5.6.  Initial CK-MB was 249 and 22.4 with a relative index  of 9.0 and troponin 4.63.  Second CK-MB was 464 and 56.5 with a relative  index of 12.2 and troponin of 18.86.  Subsequent enzymes were declining.  Admission brain natriuretic peptide was 961.  At the time of discharge,  it was 749.  Fasting lipids on the 28th showed a total cholesterol of  84, triglycerides 38, HDL 21, LDL 55.  TSH was 1.297.  Iron studies  showed an iron 44, TIBC was low at 206, percent saturation 21.  Chest x-  ray on the 27th showed cardiomegaly, mild interstitial pulmonary edema  consistent with mild congestive heart failure.  Electrocardiogram on  admission showed normal sinus rhythm, normal axis, delayed R-wave with  an incomplete right bundle branch block.  He had T-wave inversion in V3  through  V6.  This was new compared to an electrocardiogram of Jan 07, 2007.   HOSPITAL COURSE:  The patient was taken to the catheterization lab on  August 23, 2007, by Dr. Juanda Chance.  He had native three vessel coronary  artery disease with saphenous vein graft to the posterior descending  artery, saphenous vein graft to the diagonal, saphenous vein graft to  the obtuse marginal, and left internal mammary artery to the left  anterior descending were patent.  However, it was noted he had a 90%  obtuse marginal lesion post the anastomosis and Dr. Juanda Chance performed  Taxus stenting through the saphenous vein graft to the distal obtuse  marginal without difficulty.  Left ventriculogram was not performed  given his renal insufficiency.  Dr. Juanda Chance noted that he was not sure of  the culprit lesion, but felt the obtuse  marginal was most likely the  culprit and noted he had other small vessel disease.  He recommended  Plavix at least for one year.  The site was closed with AngioSeal.  Pharmacy assisted with heparin management.  Post catheterization during  bedrest, he had some problems with urinary retention.  Thus he received  a temporary Foley with resolution.  Cardiac rehabilitation assisted with  education and ambulation.  On August 25, 2007, he was doing well.  BUN  and creatinine did increase to 25 and 2.25.  His Aggrenox was  discontinued.  He was continued on aspirin and Plavix.  Case management  also assisted with discharge needs.  By August 26, 2007, after review  by Dr. Antoine Poche, it was felt that he could be discharged home.   DISCHARGE DIAGNOSES:  1. Non-ST elevated myocardial infarction with Taxus stenting to the      obtuse marginal utilizing the saphenous vein graft.  2. Postcatheterization urinary retention requiring temporary Foley.  3. Postcatheterization elevated BUN and creatinine.  4. Normocytic anemia.  5. Hyperglycemia with a hemoglobin A1C of 5.6.  6. History as noted per past medical history.   PROCEDURE PERFORMED:  Cardiac catheterization on August 23, 2007, with  drug-eluting stent to the native obtuse marginal by Dr. Juanda Chance.   DISPOSITION:  The patient is discharged home.  New medications include:  Aspirin 325 mg daily.  Plavix 75 mg daily.  Nitroglycerin 0.4 as needed.  A recommendation for Colace 100 mg daily which is over the counter.  He  was asked to discontinue his Aggrenox.  He will continue his other  medications which include:  Levothyroxine 125 mcg every day.  Proscar 5  mg daily.  Iron two times a day as previously.  Nexium 40 mg daily.  Zegerid daily.  Lipitor 20 mg at bedtime.  Fenofibrate 54 mg daily.  Coreg 3.125 mg two times a day.  He was asked to bring all medications  to all appointments.  He will follow up with Dr. Antoine Poche on September 04, 2007,  at 3:30, and have a B-Met drawn.  He will also follow up with Dr.  Madilyn Fireman in approximately four weeks.  Wound care and activities are per  supplemental sheet.  He was asked to maintain low-sodium, heart-healthy  diet.  Discharge time 45 minutes.      Joellyn Rued, PA-C      Rollene Rotunda, MD, [facility]  Electronically Signed    EW/MEDQ  D:  08/26/2007  T:  08/26/2007  Job:  [id number]   cc:   Rollene Rotunda, MD, [facility]  Dr. Madilyn Fireman

## 2011-01-09 NOTE — Assessment & Plan Note (Signed)
[facility]                            CARDIOLOGY OFFICE NOTE   [name], [name]                      MRN:          [id number]  DATE:09/02/2008                            DOB:          1920-08-24    PRIMARY CARE PHYSICIAN:  Feliberto Gottron PA with Sharlot Gowda, MD   REASON FOR PRESENTATION:  Evaluate the patient with coronary artery  disease and atrial fibrillation.   HISTORY OF PRESENT ILLNESS:  The patient presents for followup of the  above.  He was last seen in December and was noted to be in atrial  fibrillation.  He was placed on Coumadin at that time.  It was not felt  that he was really feeling this.  He has not had any palpitations though  he says he has always had increased heart rate.  He has not had any  presyncope or syncope.  He has had no new shortness of breath.  He has  had no chest discomfort, neck, or arm discomfort.  He has had some  increased lower extremity swelling.  He has tolerated the Coumadin.   He comes today for followup of this.  He does say he has had some  fleeting pains in his head.  These are mild sharp pains in the right  temporal area.  This is not particularly problematic, but they do  mention it.   PAST MEDICAL HISTORY:  1. Ischemic cardiomyopathy (EF about 25% in the past, but was most      recently 55% on echo).  2. Coronary artery disease (CABG in 2007 with a LIMA to the LAD, SVG      to first diagonal, SVG to ramus intermediate, SVG to obtuse      marginal, and SVG to PDA).  3. Status post PCI (90% obtuse marginal lesion following the      anastomosis of the vein graft to the obtuse marginal.  This was a      2.25 Taxus stent).  4. Gastroesophageal reflux disease.  5. Hypertension.  6. Chronic renal insufficiency.  7. Paroxysmal atrial fibrillation.  8. Left frontal CVA.  9. Anemia.  10.Abnormal sleep study (he had some leg jerking).   ALLERGIES/INTOLERANCES:  ACE INHIBITOR and ARB.   MEDICATIONS:  1. Finasteride 5 mg daily.  2. Levothyroxine 100 mcg daily.  3. Omeprazole 20 mg daily.  4. Plavix 75 mg daily.  5. Fenofibrate 54 mg daily.  6. Carvedilol 3.125 mg daily.  7. Simvastatin 20 mg daily.  8. Zantac.  9. Coumadin.  10.Aspirin 81 mg daily.   REVIEW OF SYSTEMS:  As stated in the HPI and otherwise negative for  other systems.   PHYSICAL EXAMINATION:  GENERAL:  The patient is very pleasant and in no  distress.  VITAL SIGNS:  Blood pressure 118/68, heart rate 83 and irregular, weight  165 pounds, and body mass index 23.  HEENT:  Eyelids unremarkable.  Pupils equal, round, react to light.  Fundi not visualized.  Oral mucosa unremarkable.  NECK:  No jugular venous distention at 45 degrees.  Carotid upstroke  brisk and  symmetrical.  No bruits.  No thyromegaly.  LYMPHATICS:  No cervical, axillary, or inguinal adenopathy.  LUNGS:  Clear to auscultation bilaterally.  BACK:  No costovertebral angle tenderness.  CHEST:  Well-healed sternotomy scar.  HEART:  PMI not displaced or sustained.  S1 and S2 within normal limits.  No S3.  No murmurs, no clicks, no rubs.  ABDOMEN:  Flat, positive bowel sounds.  Normal in frequency and pitch.  No bruits, no rebound, no guarding.  No midline pulsatile mass.  No  hepatomegaly.  No splenomegaly.  SKIN:  No rashes, no nodules.  EXTREMITIES:  Pulses 2+ upper, 1+ dorsalis pedis and posterior tibialis.  Moderate bilateral lower extremity edema.  NEUROLOGIC:  Oriented to person, place, and time.  Cranial nerves II  through XII grossly intact.  Motor grossly intact.   EKG, atrial fibrillation, right axis deviation, incomplete right bundle-  branch block, QT prolonged, no acute ST-T wave changes.   ASSESSMENT AND PLAN:  1. Atrial fibrillation.  For now I am going to add a Holter monitor      and make sure he does not have any paroxysms with this.  If he is      in sustained atrial fibrillation, I most likely would try to get       him back into sinus rhythm as I am worried that his ejection      fraction will deteriorate, or may have already though he is not      having overt symptoms.  He will remain on Coumadin.  For now,      because of the small Taxus stent, I think we need to continue the      Plavix as well.  His aspirin is now down to 81 mg.  2. Coronary artery disease as above.  3. Cardiomyopathy.  He does have some increased lower extremity edema,      but no overt dyspnea.  For better rate control and for management      of his cardiomyopathy, I am going to increase his carvedilol to      3.125 mg twice a day.  4. Lower extremity edema.  I am going to give him Lasix 20 mg 3 times      a day along with potassium 20 mEq once a day and potassium 10 mEq      once a day for the next 3 days.  At the end of that time, he will      start the b.i.d. carvedilol.  He is going to get a BMET when he      comes back early next week for his Holter monitor.  5. Abnormal sleep study.  The patient's wife does describe some      twitching of his legs; however, they were offered ReQuip by Dr.      Shelle Iron and they do not want to try this as it is      not so severe.  She will let me know in the future if it gets      worse.  6. Followup.  I will see the patient again in no longer than 1 month      or sooner if needed.     Rollene Rotunda, MD, [facility]  Electronically Signed    JH/MedQ  DD: 09/02/2008  DT: 09/03/2008  Job #: [id number]   cc:   Feliberto Gottron, PA

## 2011-01-09 NOTE — Cardiovascular Report (Signed)
NAME:  [name], [name]               ACCOUNT NO.:  [id number]   MEDICAL RECORD NO.:  [id number]          PATIENT TYPE:  INP   LOCATION:  2914                         FACILITY:  [facility]   PHYSICIAN:  Bruce R. Juanda Chance, MD, FACCDATE OF BIRTH:  [birth year]   DATE OF PROCEDURE:  08/23/2007  DATE OF DISCHARGE:                            CARDIAC CATHETERIZATION   CARDIAC CATHETERIZATION REPORT, GRAFT STUDY, AND PERCUTANEOUS  INTERVENTION:   CLINICAL HISTORY:  [name] is 77 years old and had coronary bypass  surgery in 2007.  He developed the onset of chest pain at 2 a.m. last  night and came to our Emergency Room.  His ECG was nondiagnostic, but  his troponins were positive.  Because of persistent pain this morning,  Dr. Myrtis Ser made the decision to bring him to the cath lab for evaluation  with angiography.   PROCEDURE:  The procedure was performed with a right femoral arterial  sheath and #5-French __________ coronary catheters.  No left  ventriculogram was performed because of the patient's creatinine of 2.0.  An aortic root injection was performed because we were not able to  selectively engage the left main.  This had a 90% ostial lesion pre  coronary artery bypass graft.  After completion of the diagnostic study,  made decision to proceed with intervention on the lesion in the  circumflex marginal vessel supplied by patent vein graft.   The patient given antiemetics and bolus infusion.  Had previously been  given chewable aspirin and 300 mg of Plavix.  We used an AL-1 #6-French  guiding catheter with side holes.  We crossed the lesion with a Prowater  wire without difficulty.  We direct stented with a 2.25 x 8 mm TAXUS-  ATOM drug-eluting stent.  We deployed this with 1 inflation of 16  atmospheres for 30 seconds.  We then postdilated with a 2.5 x 8 mm  PowerSail, inflating this with 1 inflation up to 20 atmospheres for 30  seconds.  Final __________ were then performed through the  guiding  catheter.  The right femoral was closed with Angio-Seal at the end  procedure.  The patient tolerated the procedure well and left the  laboratory in satisfactory condition.   RESULTS:  1. The aortic pressure was 136/59 with a mean of 93 and left neck      pressure 136/30.  2. The left main coronary artery was completely occluded at its      ostium.  3. The right coronary artery was a moderate-sized vessel with a      posterior descending branch and 3 posterolateral branches.  There      was 80% proximal and 50% distal stenosis.  There was 90% stenosis      in the past beyond the post descending branch, which threatened 3      small posterolateral branches.  There was competing flow in the      post descending branch with the vein graft.  4. The vein graft to the posterior descending branch of the right      coronary artery was patent and functioned  normally.  5. The vein graft to the marginal branch of the circumflex artery was      patent.  There was a 90% stenosis just distal to the insertion      site.  There was also 90% stenosis proximal to the marginal branch,      which threatened an AV branch of the circumflex artery.  6. The vein graft to the diagonal branch of the left anterior      descending was patent and functioned normally.  7. The left internal mammary artery graft to the left anterior      descending was patent and functioned normally.  There is no      significant distal disease.  There was total occlusion of the      diagonal branch after the vein insertion site to the diagonal      branch distally.  8. No left ventriculogram was performed.  9. An aortic root injection was performed, which did not show any      patency of the left main artery.  10.Following stenting of the lesion in the marginal branch of the      circumflex artery, stenosis improved from 90% to 0%.  The flow was      TIMI treated before and after intervention.   CONCLUSION:  1.  Coronary artery disease, status post coronary bypass graft surgery      in 2007.  2. Severe native vessel disease with total occlusion of the left main      coronary artery, 80% proximal and 50% distal stenosis in the right      coronary with 90% stenosis in the posterolateral branch of the      right coronary artery.  3. Patent vein graft to the posterior descending branch of the right      coronary artery, patent vein graft to diagonal branch of the left      anterior descending but with occlusion of the distal portion of the      diagonal branch, patent left internal mammary artery graft to the      left anterior descending, and patent vein graft to the marginal      branch of the circumflex artery with 90% stenosis distal to the      insertion site.  4. Successful percutaneous coronary intervention of the lesion of the      circumflex marginal vessel with improvement in __________ center      narrowing from 90% to 0% using a drug-eluting stent.   DISPOSITION:  The patient __________ for further observation.  Will plan  to get a 2D echocardiogram to evaluate the patient's left ventricular  function.  He will probably need to stay in the hospital the hospital 2  days.  I would recommend Plavix for at least 1 year.      Bruce Elvera Lennox Juanda Chance, MD, [facility]  Electronically Signed     BRB/MEDQ  D:  08/23/2007  T:  08/23/2007  Job:  [id number]   cc:   Dr. Maryruth Eve  Sharlot Gowda, M.D.

## 2011-01-09 NOTE — Discharge Summary (Signed)
[name], [name]               ACCOUNT NO.:  [id number]   MEDICAL RECORD NO.:  [id number]          PATIENT TYPE:  INP   LOCATION:  4733                         FACILITY:  [facility]   PHYSICIAN:  Rollene Rotunda, MD, FACCDATE OF BIRTH:  13-Feb-1920   DATE OF ADMISSION:  09/13/2008  DATE OF DISCHARGE:  09/17/2008                               DISCHARGE SUMMARY   PRIMARY CARE PHYSICIAN:  Elpidio Galea, PA with Dr. Ilene Qua office.   PRIMARY CARDIOLOGIST:  Rollene Rotunda, MD, [facility]   DISCHARGING DIAGNOSES:  1. Atrial fibrillation with rapid ventricular response status post      transesophageal echocardiography this admission with attempted      cardioversion.  However, the patient found to have left atrial      appendage clot, cardioversion not performed.  2. Mild congestive heart failure exacerbation in the setting of atrial      fibrillation, probably diuresed this admission and euvolemic at      time of discharge, and diuretic, discontinued prior to discharge      secondary to increased renal insufficiency.  3. Acute on chronic renal insufficiency with a creatinine of 2.3 at      time of discharge.  4. Chronic anticoagulation therapy.  INR of 2.1 at time of discharge,      change has been made in his home Coumadin dose.  He will take 5 mg      on Mondays, Wednesdays, and Fridays and Saturday has been added on      as 5 mg day.  He will 2.5 mg on Sundays, Tuesdays, and Thursdays.      Pending followup, PT/INR on Monday, September 20, 2008, at 10:45 in      our Coumadin Clinic.   PAST MEDICAL HISTORY:  1. Positive for coronary artery disease status post CABG, GERD,      hypertension, chronic renal insufficiency, paroxysmal atrial fib,      left frontal CVA, chronic anemia, abnormal sleep study with leg      jerking, cardiomyopathy with EF previously 25%, but currently 55%      by echocardiogram in 2008.  A 2D echocardiogram obtained this      admission showed an EF now 45%.  2.  Dyslipidemia.  3. Hypothyroidism.   PROCEDURES:  A transthoracic echocardiogram on September 14, 2008, and  transesophageal echocardiogram on September 15, 2008.   HOSPITAL COURSE:  Mr. [name] was admitted for atrial fibrillation and  mild heart failure exacerbation.  Mr. [name] on day of admission had  complained of chest discomfort.  After eating breakfast, he was  instructed to come to the ER for further evaluation adjusting Dr.  Antoine Poche in the office last week and found to be in atrial fib after  wearing a Holter monitor.  The plan had been to cardiovert him if he was  not having paroxysmal atrial fib.  The patient was found to be in  persistent atrial fib and plan was to admit him when therapeutic INRs  and do a direct current cardioversion.  However, the chest pain prompted  the patient to go  ahead and come to the emergency room, get evaluated in  the ER, and he was found to be in atrial fib at a rate of 100.  It was  decided to admit the patient for further evaluation, check a PT/INR; INR  subtherapeutic at 1.9.  A 2D echocardiogram obtained, results as stated  above.  The patient with mild CHF, was diuresed appropriately with  Lasix; however, experienced increase in his creatinine.  Once the fluid  volume status was stable, Lasix was discontinued.  Then, decided to  proceed with transesophageal echocardiogram.  The patient found to have  a left atrial appendage.  Direct current cardioversion was not carried  out.  The patient returned to room, heparinized, and started on Coumadin  for now, will need a therapeutic INR for 3 weeks prior to cardioversion.  On day of discharge, his INR is 2.1, hemoglobin 10.4, platelets 97,000,  potassium 4.5, and creatinine 2.3.  Dr. Alwyn Ren felt the patient is  stable to be discharged home to follow up with him.  I will set him up  in the Coumadin Clinic for Monday, September 20, 2008, at 10:45 and Dr.  Antoine Poche, September 28, 2008, at 12 noon.  Mr. [name]  also has a suspicious-  looking mole on his right cheek of his face directly below his  cheekbone.  I have asked him to follow up with Elpidio Galea regarding  this abnormality for further workup.  She may need to be referred to  Dermatology.   MEDICATIONS AT TIME OF DISCHARGE:  1. Plavix 75.  2. Aspirin 81.  3. Fenofibrate 54.  4. Iron twice a day.  5. Ranitidine 150 mg b.i.d.  6. Synthroid 100 mcg daily.  7. Carvedilol.   The patient is only able to tolerate 3.125 mg once a day, simvastatin 20  mg daily.  The patient takes half a tablet, warfarin is now 5 mg on  Mondays, Wednesdays, Fridays, and Saturdays, 2.5 mg on Sundays,  Tuesdays, and Thursdays, and his Proscar 5 mg a day.  The only change in  his medication is the dose of warfarin on Saturdays, is now 5 instead of  2.5.  I noted multiple medical discrepancies on the reconciliation form,  filled out here in the hospital by the nursing staff; this is the  correct list of medications.  Please do not follow the medication  reconciliation form as it is not correct.  I will also arrange for the  patient to have a BMET drawn on Monday, September 20, 2008, when he comes  to the Coumadin Clinic to check on his renal status.  Duration of  discharge encounter well over 30 minutes.      Dorian Pod, ACNP      Rollene Rotunda, MD, [facility]  Electronically Signed    MB/MEDQ  D:  09/17/2008  T:  09/17/2008  Job:  [id number]   cc:   Elpidio Galea

## 2011-01-09 NOTE — Assessment & Plan Note (Signed)
[facility]                            CARDIOLOGY OFFICE NOTE   [name], [name]                      MRN:          [id number]  DATE:10/25/2008                            DOB:          1920-06-11    PRIMARY CARE PHYSICIAN:  Elpidio Galea, PA for Dr. Sharlot Gowda, MD   REASON FOR PRESENTATION:  Evaluate the patient with heart failure.   HISTORY OF PRESENT ILLNESS:  The patient is 77 years old.  He presents  for evaluation of increasing dyspnea.  I performed the cardioversion  last week for treatment of atrial fibrillation.  Unfortunately, he has  had progressive dyspnea.  Over the weekend, he could not walk to his  house without having to stop to rest.  His wife is describing over the  last month or so Cheyne-Stokes respirations, though not describing new  PND or orthopnea.  His weight has been slowly increasing.  We have  occasionally treated this with doses of Lasix, but he has renal  insufficiency so we have to be quite careful.  Unfortunately, he did not  have any significant improvement with his cardioversion.  He is having  no new chest discomfort, neck, or arm discomfort.  He is having no  palpitations and does not describe any presyncope or syncope.  He has  had continued lower extremity swelling.   PAST MEDICAL HISTORY:  1. Coronary artery disease (status post CABG with LIMA to the LAD, SVG      to first diagonal, SVG to intermediate, SVG to obtuse marginal, SVG      to PDA in 2007.  He had PCI to an obtuse marginal lesion after the      anastomosis of a vein graft insertion.  This was in December 2008      with a 2.25 Taxus).  2. Cardiomyopathy (EF 25% at the time of surgery, but improved to 55%.      The last echo again suggested this to be 45% in January).  3. Gastroesophageal reflux disease.  4. Hypertension.  5. Atrial fibrillation.  6. Chronic renal insufficiency.  7. Left frontal CVA.  8. Chronic anemia.  9. Abnormal sleep  study.  10.Leg jerking.   ALLERGIES:  ACE INHIBITOR and ARBs.   MEDICATIONS:  1. Finasteride.  2. Levothyroxine 100 mcg daily.  3. Omeprazole 20 mg daily.  4. Plavix 75 mg daily.  5. Iron.  6. Fenofibrate 54 mg daily.  7. Simvastatin 20 mg daily.  8. Coumadin.  9. Aspirin 81 mg daily.  10.Coreg 3.125 mg b.i.d.  11.Ranitidine 150 mg b.i.d.   REVIEW OF SYSTEMS:  As stated in the HPI and other negative for other  symptoms.   PHYSICAL EXAMINATION:  GENERAL:  The patient is in no acute distress,  but he looks somewhat chronically ill.  VITAL SIGNS:  Blood pressure 124/66, heart rate 80 and regular, weight  175 pounds (up to about 5 pounds since I last saw him), and body mass  index 23.  HEENT:  Eyelids unremarkable.  Pupils equal, round, and reactive to  light.  Fundi not  visualized.  Oral mucosa unremarkable.  NECK:  Jugular venous distention to the angle of jaw at 45 degrees.  Carotid upstroke brisk and symmetric.  No bruits.  No thyromegaly.  LYMPHATICS:  No cervical, axillary, or inguinal adenopathy.  LUNGS:  Decreased breath sounds, but no wheezing or crackles.  BACK:  No costovertebral angle tenderness.  CHEST:  Unremarkable.  HEART:  PMI not displaced or sustained.  S1 and S2 within normal limits.  No S3, no S4.  No clicks, no rubs, no murmurs.  ABDOMEN:  Flat, positive bowel sounds, normal in frequency and pitch.  No bruits, no rebound, no guarding.  No midline pulsatile mass.  No  hepatomegaly.  No splenomegaly.  SKIN:  No rashes, no nodules.  EXTREMITIES:  Upper pulse 2+, 1+ dorsalis pedis and posterior tibialis  bilaterally, moderate bilateral lower extremity edema to above the  knees.  NEUROLOGIC:  Oriented to person, place, and time.  Cranial nerves II-XII  grossly intact.  Motor grossly intact.   EKG, sinus rhythm, rate 80, right bundle branch block, QT slightly  prolonged, nonspecific ST-T wave changes.   ASSESSMENT AND PLAN:  1. Dyspnea.  The patient's  dyspnea is almost definitely related to      heart failure.  He has significant volume retention.  This is      difficult to manage because of his renal insufficiency.  I would      hope he get better with the cardioversion that was only been a few      days.  He has not had any improvement.  He is not taking diuretics      regularly as we have been trying to use this p.r.n.  At this point,      I am going to start furosemide 20 mg b.i.d.  I could do this in the      hospital, but we will try p.o. at first as long as we can get his      blood work checked everyday.  He is not keeping his feet elevated      and I told him it is essential for him to do this.  He has been      aware of his compression stockings.  We will see what the BMET      shows today and how he responds.  I will adjust his meds daily.  If      he does not respond in the next 2 days, he will be hospitalized.  I      have this discussion with the patient, his wife, and his daughter      and they understand the plan and agreed to proceed.  2. Renal insufficiency.  As above.  3. Coronary artery disease.  He is not having any ongoing symptoms.      No further cardiovascular testing is suggested.  4. Atrial fibrillation.  He is maintaining sinus rhythm.  He remains      on the Coumadin.  He remain on the meds as listed.  5. Hypothyroidism.  We will have this checked if it has not been      checked recently when he comes back.  Last labs I see here from      December.  6. Dyslipidemia.  We will continue to manage this with a goal LDL less      than 100 and HDL greater than 40.  7. Followup will be in longer than 1 week or sooner if  he      decompensates.     Rollene Rotunda, MD, [facility]  Electronically Signed    JH/MedQ  DD: 10/25/2008  DT: 10/26/2008  Job #: [id number]   cc:   Logan Bores, PA

## 2011-01-09 NOTE — Assessment & Plan Note (Signed)
[name] [name]                            CARDIOLOGY OFFICE NOTE   [name] [name]                      MRN:          [id number]  DATE:08/04/2008                            DOB:          [birth year]    INTRODUCTION:  [name] [name] is a pleasant 77 year old gentleman with a  history of paroxysmal atrial fibrillation, coronary artery disease,  ischemic cardiomyopathy, and chronic New York Heart Association class II  heart failure symptoms who presents today for followup of atrial  fibrillation.   PROBLEM LIST:  1. Ischemic cardiomyopathy.  2. Coronary artery disease, status post CABG in 2007.  3. New York Heart Association class II heart failure symptoms      chronically.  4. Status post PCI of an obtuse marginal lesion following the      anastomosis of a vein graft with a Taxus stent.  5. Hypertension.  6. Chronic renal insufficiency.  7. Prior cerebrovascular accident.  8. Paroxysmal atrial fibrillation.  9. Hypertension.  10.Anemia.   ALLERGIES:  ACE INHIBITORS and ARBs.   CURRENT MEDICATIONS:  1. Finasteride 5 mg daily.  2. Levothyroxine 100 mcg daily.  3. Omeprazole 20 mg daily.  4. Plavix 75 mg daily.  5. Ferrous sulfate 325 mg daily.  6. Aspirin 325 mg daily.  7. Fenofibrate daily.  8. Coreg 3.125 mg daily.  9. Simvastatin 20 mg at bedtime.  10.Zantac 75 mg b.i.d.   INTERVAL HISTORY:  [name] [name] is referred to clinic today after being  discovered to have atrial fibrillation.  He was recently evaluated  within his primary care physician's office and discovered to have atrial  fibrillation.  The patient is unaware as to how long he might have had  atrial fibrillation.  He is relatively asymptomatic.  He denies  shortness of breath above his baseline, chest discomfort, palpitations,  orthopnea, PND, or worsening lower extremity edema.  He is otherwise  without complaint today.  He is tolerating aspirin and Plavix presently  without  bleeding.  He reports that approximately 2 days ago he had some  numbness within his right arm, which prompted him to followup in Dr.  Jola [name] office.  He denies any subsequent neurologic symptoms.  He is  otherwise without complaint today.   PHYSICAL EXAMINATION:  VITAL SIGNS:  Blood pressure 134/67, heart rate  78, respirations 18, and weight 164 pounds.  GENERAL:  The patient is a well-appearing male in no acute distress.  He  is alert and oriented x3.  HEENT:  Normocephalic and atraumatic.  Sclerae clear.  Conjunctivae  pink.  Oropharynx clear.  NECK:  Supple.  JVP 8 cm.  LUNGS:  Clear.  HEART:  Irregularly irregular rhythm.  No murmurs, rubs, or gallops.  GI:  Soft, nontender, nondistended.  Positive bowel sounds.  EXTREMITIES:  No clubbing, cyanosis, or edema.  NEUROLOGIC:  Cranial nerves II through XII are intact.  Strength and  sensation are intact.  SKIN:  No ecchymosis or lacerations.   EKG obtained on August 03, 2008 reveal atrial fibrillation with an  average ventricular rate of 80 beats per minute  otherwise, unremarkable.   IMPRESSION:  [name] [name] is a pleasant, elderly gentleman with a known  history of atrial fibrillation who now presents with persistent atrial  fibrillation.  He remains asymptomatic with his atrial fibrillation at  this time.  The patient has a CHADS2 score of 5 and is therefore at high  risk for stroke.  I think that he should be chronically anticoagulated  with Coumadin and I see no relative contraindication to Coumadin at this  time.  The patient appears to be relatively rate controlled at this time  with Coreg.  As he is asymptomatic with atrial fibrillation, I do not  think that aggressive pursuit of rhythm control is necessary at this  time.   PLAN:  1. Coumadin 2.5 mg daily.  2. The patient is enrolled in the Coumadin Clinic.  3. He will decrease aspirin to 81 mg daily.  4. Follow up with Dr. Antoine [name] in 4 weeks.     [name] Range,  MD  Electronically Signed    JA/MedQ  DD: 08/04/2008  DT: 08/05/2008  Job #: [id number]   cc:   Rollene Rotunda, MD, [facility]  Sharlot Gowda, M.D.  Feliberto Gottron

## 2011-01-09 NOTE — Assessment & Plan Note (Signed)
[facility]                                 ON-CALL NOTE   [name], [name]                      MRN:          [id number]  DATE:12/05/2007                            DOB:          1920/02/25    PRIMARY CARDIOLOGIST:  Rollene Rotunda, MD, [facility]   PRIMARY CARE PHYSICIAN:  Sharlot Gowda, MD   HISTORY:  [name] is an 77 year old male whose wife called the  answering service this afternoon concerned in regards to a head cold.  She prefers to talk to me and will not let her husband talk to me.  She  states that she has recently come off amoxicillin for an upper  respiratory infection and she is concerned her husband is coming down  with this.  He describes head congestion, a sore throat and a cough.  She denies any increase in temperature.   I explained to [name] that I work with Dr. Antoine Poche, a cardiologist,  and I asked if he is having any cardiac issues such as shortness of  breath, pedal edema, weight gain or orthopnea given his history of  ischemic cardiomyopathy.  She states no, he is not having any problems  with his heart.  I explained to her that she should contact his primary  care physician to review the possible necessity of antibiotic treatment.  I have told her in review of her medications based on the note of November 13, 2007, he would be safe to take Mucinex or Robitussin over-the-  counter for the cough but suggested that she contact his primary care  physician for further evaluation.  She states that the PA that they  usually see in the office is not there today.  I further explained that  they will have someone else covering for his physician and PA and  suggested that she contact him.  She states that she will try the cough  medicine first and if that does not work that she will call him, his  primary care physician.      Joellyn Rued, PA-C  Electronically Signed      Jesse Sans. Daleen Squibb, MD, [facility]  Electronically Signed   EW/MedQ  DD: 12/05/2007  DT: 12/05/2007  Job #: [id number]

## 2011-01-09 NOTE — H&P (Signed)
[name], [name] NO.:  [id number]   MEDICAL RECORD NO.:  [id number]          PATIENT TYPE:  EMS   LOCATION:  MAJO                         FACILITY:  [facility]   PHYSICIAN:  Rollene Rotunda, MD, FACCDATE OF BIRTH:  [birth year]   DATE OF ADMISSION:  09/13/2008  DATE OF DISCHARGE:                              HISTORY & PHYSICAL   PRIMARY:  Dr. Sharlot Gowda.   REASON FOR PRESENTATION:  Evaluate patient with atrial fibrillation and  heart failure.   HISTORY OF PRESENT ILLNESS:  The patient is a very lovely 77 year old  white gentleman with a cardiac history that includes coronary disease  and atrial fibrillation, as well as previous cardiomyopathy.  I saw him  last week in the office.  He was in atrial fibrillation.  He had been in  it since December.  He had been started on Coumadin.  I put a Holter on  him which he returned today.  My plan was ultimately to cardiovert him  if he was not having paroxysms of A fib.  If he was in persistent A fib,  I was going to wait for therapeutic INRs and DC cardiovert.   However, this morning he had some chest discomfort.  It happened after  he ate his breakfast.  It was a lower epigastric discomfort.  It was  similar to previous angina, but did not radiate to his back.  It was  about 4/10 in intensity.  It went away spontaneously.  There was no  radiation to his jaw or to his arms.  There were no associated symptoms.  He had some recurrence of this after eating a little bit of lunch.  However, what most concerned him was shortness of breath.  He went  outside and did very little activity and was dyspneic when he came back.  He has noticed some increasing dyspnea over the last couple of days.  He  has had some increased lower extremity swelling.  I noted this in the  office last week and actually gave him a short course of diuretics which  improved this.  He had gained some weight, but thought that this had  gone down with the  diuretics.  He is currently pain free.  He called our  office and was referred to the emergency room.   In the ER, he was found to be in A fib.  His rate is about 100.  He has  no acute EKG changes and is pain free.  He is not having any  palpitations.  He has not had any presyncope or syncope.  He has not had  any PND or orthopnea.   PAST MEDICAL HISTORY:  Coronary artery disease (status post CABG as  described below.  He had a PCI to an obtuse marginal lesion following  the anastomosis of the vein graft.  This was treated with a 2.25 Taxus  stent.  This was December 2008.), cardiomyopathy (EF had been about 25%,  but improved to 55% on echocardiography in 2008), gastroesophageal  reflux disease, hypertension, chronic renal insufficiency, paroxysmal  atrial fibrillation in the  past, left frontal CVA, chronic anemia,  abnormal sleep study, (leg jerking).   PAST SURGICAL HISTORY:  CABG in 2007 (LIMA to the LAD, SVG to the first  diagonal, SVG to ramus intermediate, SVG to obtuse marginal and SVG to  PDA).   ALLERGIES/INTOLERANCES:  ACE INHIBITORS AND ARBs.   MEDICATIONS:  1. Finasteride 5 mg daily.  2. Levothyroxine 100 mcg daily.  3. Omeprazole 20 mg daily.  4. Plavix 75 mg daily.  5. Fenofibrate 54 mg daily.  6. Carvedilol 3.125 mg daily.  7. Simvastatin 20 mg daily.  8. Zantac.  9. Coumadin.  10.Aspirin 81 mg daily.   SOCIAL HISTORY:  He is married and has been for 62 years!  He does not  smoke cigarettes.  He does not drink alcohol.  He is retired.   FAMILY HISTORY:  Contributory for his mother dying with complications of  cancer.  His father died of a stroke.   REVIEW OF SYSTEMS:  As stated in the HPI and negative for all other  systems.   PHYSICAL EXAMINATION:  GENERAL:  The patient is very pleasant and in no  distress.  VITAL SIGNS:  Blood pressure 141/67, heart rate 102 and irregular,  respiratory rate 20, temperature 97.5.  HEENT:  Eyelids unremarkable.  Pupils  equal, round and reactive to  light.  Fundi not visualized.  Oral mucosa unremarkable.  NECK:  Jugulovenous distention at the angle of the jaw at 45 degrees.  Carotid upstroke brisk and symmetric.  No bruits, no thyromegaly.  LYMPHATICS:  No cervical, axillary or inguinal adenopathy.  LUNGS:  Decreased breath sounds with a few basilar crackles, no  wheezing.  No dullness to percussion.  BACK:  No costovertebral tenderness.  CHEST:  A well-healed sternotomy scar.  HEART:  PMI not displaced or sustained, S1-S2 within normal limits.  No  S3, no murmurs, no clicks, no rubs.  ABDOMEN:  Flat, positive bowel sounds, normal in frequency and pitch.  No bruits, no rebound, no guarding, no midline pulsatile mass.  No  hepatomegaly or splenomegaly.  SKIN:  No rashes, no nodules.  EXTREMITIES:  2+ upper pulse, 1+ dorsalis pedis and posterior tibialis  bilaterally, mild to moderate bilateral ankle edema.  NEURO:  Oriented to person, place and time.  Cranial nerves II-XII  grossly intact.  Motor grossly intact throughout.   EKG - atrial fibrillation, rate 101, right axis deviation, incomplete  right bundle branch block, QTC prolonged, no acute ST-T wave changes.   ASSESSMENT/PLAN:  1. Atrial fibrillation.  I think the atrial fibrillation is      contributing significantly to symptoms and in particular the      dyspnea.  My plan was elective cardioversion, but I think we will      need to do this more urgently.  He has a subtherapeutic INR.  I      will start him on heparin.  I will continue Coumadin.  When he is      breathing a little bit better, I will plan a TEE guided      cardioversion.  Initially, I will do that off of antiarrhythmics,      though he may need antiarrhythmics in the future.  For now, I will      continue low-dose carvedilol.  I will give him one dose of digoxin.      His rate is not extremely elevated at this point.  2. Dyspnea.  I think the patient is probably in failure.  I  wonder if      his ejection fraction is still 55%.  I will get another      echocardiogram.  I will treat him gingerly with diuretics.  Will      salt and fluid restrict.  Will watch strict Is and Os and daily      weights.  Ultimately getting him back in sinus rhythm will be the      therapy.  I did increase his carvedilol at the last visit and will      continue this.  He has been intolerant of ACE inhibitors and ARBs.  3. Coronary disease/chest pain.  The patient is having some chest      discomfort reminiscent of his previous angina.  I would want to      avoid an invasive evaluation with his creatinine.  However,      probably pursue a stress perfusion study, but we will treat his      atrial fibrillation first unless his cardiac enzymes are positive.      He will continue with risk reduction.  Of note, I am going to stop      the Plavix now that he is on Coumadin and aspirin.  He is greater      than a year after his drug-eluting stent.  4. Hypothyroidism.  Will check a TSH.  He will continue his Synthroid.  5. Renal insufficiency.  We will have to watch this very closely with      his diuresis.  6. Anemia.  This has been chronic.  Will follow this and make sure      there has been no abrupt change.      Rollene Rotunda, MD, [facility]  Electronically Signed     JH/MEDQ  D:  09/13/2008  T:  09/13/2008  Job:  [id number]   cc:   Sharlot Gowda, M.D.

## 2011-01-09 NOTE — Assessment & Plan Note (Signed)
[facility]                          CHRONIC HEART FAILURE NOTE   [name], [name]                      MRN:          [id number]  DATE:02/03/2007                            DOB:          10/11/1919    PRIMARY CARDIOLOGIST:  Dr. Rollene Rotunda.   PRIMARY CARE:  Patient is followed by Elpidio Galea P.A., works with Dr.  Sharlot Gowda.   [name] is new to my Heart Failure Clinic.  He is a very pleasant 77-  year-old Caucasian gentleman with known history of coronary artery  disease, underwent coronary artery bypass grafting in 2007.  He is  followed by Dr. Antoine Poche here in the clinic.  Apparently he was  hospitalized back in May for chest discomfort, underwent a stress  Myoview at that time that showed an ejection fraction of 24%.  Since  that time, patient has been started on  Zegerid and Nexium and actually  has had some improvement in his symptoms.  He saw Dr. Antoine Poche here at  the end of May for followup, at which time patient denied any chest  discomfort.  Dr. Antoine Poche ordered an echocardiogram and arranged for  patient to follow up with me.  [name] states he just had his  echocardiogram done here today. He denies any episodes of chest  discomfort.  Previously he also had a lot of discomfort with indigestion  and heartburn and he states this has improved some also.  Apparently he  had been evaluated in the past, it sounds like he may have had an  ApneaLink at one time and was referred for a sleep study for abnormal  results.  The patient states his heartburn was so severe though, that he  could not proceed with the sleep study and that apparently was cancelled  and has not been rescheduled yet.  [name] also has a history of a CVA  back in February of this year.  Appears to be Dr. Sandria Manly is his  neurologist.  [name] denies any residual effects from his stroke.  States he has been doing quite well.  He also denies any symptoms  suggestive of volume overload at this time.   PAST MEDICAL HISTORY:  Includes:  1. Cardiomyopathy with an ejection fraction of 24% by stress Myoview,      pending results of 2-D echocardiogram.  2. Coronary artery disease, status post CABG in the past.  3. GERD.  4. Hypertension.  5. Renal insufficiency.  6. Brief period of atrial fib status post CABG.  7. Left frontal lobe cerebral vascular accident.  8. History of normocytic anemia.  9. Questionable obstructive sleep apnea,  pending further evaluation.   REVIEW OF SYSTEMS:  As stated above.   CURRENT MEDICATIONS:  Include:  1. Lipitor 20 mg daily.  2. Proscar 5 mg daily.  3. Aggrenox daily.  4. Synthroid 125 mcg daily.  5. Nexium 40 mg daily.  6. Iron 150 mg b.i.d.  7. Zegerid at bedtime.  8. Carvedilol 3.125 mg b.i.d.   PHYSICAL EXAMINATION:  Weight 169 pounds.  Blood pressure 100/50, pulse  8.  [name] is in no acute distress.  A very pleasant gentleman, a little  hard of hearing.  Otherwise, very attentive and alert.  No jugular vein distention at 45 degree angle.  His lungs are clear to auscultation.  CARDIOVASCULAR:  Reveals an S1 and S2.  No murmurs, rubs or gallops  heard.  ABDOMEN:  Soft, nontender.  Positive bowel sounds.  LOWER EXTREMITIES:  Without clubbing, cyanosis or edema.   IMPRESSION:  1. Cardiomyopathy with an ejection fraction 24% by stress echo      pending.  2. History of coronary artery disease.  3. Recent cardiovascular accident without residual effects.  4. Questionable obstructive sleep apnea pending further workup.  5. Gastroesophageal reflux disease.  6. I am going to continue the patient's current medications.  Blood      pressure will not allow for further titration of his beta blocker      at this time.  7. I have initiated heart failure education with patient and his wife.      They have been given their heart failure packet.  Patient is to      weigh daily and call me if they  experience any symptoms associated      with volume overload.  I will see him back in 3 weeks.      Dorian Pod, ACNP  Electronically Signed      Bevelyn Buckles. Bensimhon, MD  Electronically Signed   MB/MedQ  DD: 02/03/2007  DT: 02/04/2007  Job #: [id number]   cc:   Debby Bud. Meredeth Ide

## 2011-01-09 NOTE — Assessment & Plan Note (Signed)
[facility]                            CARDIOLOGY OFFICE NOTE   [name], [name]                      MRN:          [id number]  DATE:11/13/2007                            DOB:          04-25-1920    PRIMARY CARE PHYSICIAN:  Sharlot Gowda, M.D.   REASON FOR EVALUATION:  Evaluate patient with coronary disease.   HISTORY OF PRESENT ILLNESS:  The patient is 77 years old.  He presents  for follow-up of the above.  At the last visit, he was complaining of  some chest discomfort for which he had taken sublingual nitroglycerin.  However, in the last couple of weeks he has not required any of this.  He has not had any chest pressure, neck or arm discomfort.  Has not any  palpitation, presyncope or syncope.  Has been dissipating in cardiac  rehab.   PAST MEDICAL HISTORY:  1. Congestive heart failure secondary to ischemic cardiomyopathy (EF      had been 24%.  Most recently was 55% on echo).  2. Coronary artery disease (had a CABG August 2007 with LIMA to the      LAD, SVG to first diagonal, ramus intermediate, obtuse marginal and      PDA.  Status post PCI of a 90% obtuse marginal lesion following the      anastomosis of the vein graft.  This was managed with 2.25 Taxus      stent.).  3. Gastroesophageal reflux disease.  4. Hypertension.  5. Chronic renal insufficiency.  6. Paroxysmal atrial fibrillation.  7. Left frontal CVA.  8. Anemia.  9. Abnormal sleep study (He saw Dr. Shelle Iron and was told he did not      have sleep apnea)   ALLERGIES:  INTOLERANCES ACE INHIBITOR, ARB CAUSES RENAL INSUFFICIENCY.   MEDICATIONS:  1. Carvedilol 3.125 mg b.i.d.  2. Finasteride 5 mg daily.  3. Levothyroxine 100 mcg daily.  4. Omeprazole 20 mg daily.  5. Simvastatin 40 mg daily.  6. Plavix 75 mg daily.  7. Iron.  8. Aspirin 325 mg daily.  9. Fenofibrate.   REVIEW OF SYSTEMS:  As stated in the HPI, and otherwise negative for  other systems.   PHYSICAL  EXAMINATION:  GENERAL;  The patient is in no distress.  VITAL SIGNS:  Blood pressure 106/46, heart rate 70 regular, weight 157  pounds, body mass index 23.  NECK:  No jugular distention 45 degrees.  Carotid upstroke brisk and  symmetrical.  No bruits, thyromegaly.  LYMPHATICS:  No adenopathy.  LUNGS:  Clear to auscultation bilaterally.  BACK:  No costovertebral angle tenderness.  CHEST:  Well-healed sternotomy scar.  HEART:  PMI not displaced or sustained, S1-S2 within normal.  No S3-S4,  no clicks, rubs, murmurs.  ABDOMEN:  Flat, positive bowel sounds normal frequency pitch, no bruits,  rebound, guarding or midline pulsatile mass or organomegaly.  SKIN:  No rashes, no nodules.  EXTREMITIES:  Upper pulse 2+, 1+ dorsalis pedis and posterior  bilaterally, trace bilateral lower extremity edema.  No cyanosis or  clubbing.  NEUROLOGICAL:  Grossly intact.  ASSESSMENT/PLAN:  1. Coronary disease.  Patient is having no further chest discomfort.      He is participating in cardiac rehab.  He is participating in      secondary risk reduction.  No further cardiovascular testing is      suggested.  2. Cardiomyopathy.  His EF is much improved.  He will continue      medications as listed.  I will not titrate his carvedilol at this      point because he still gets lightheaded and fatigued at times.  3. Renal insufficiency.  I do not see his most recent labs, but he was      told by Dr. Kathrene Bongo that his creatinine was excellent.  4. Abnormal sleep study.  He was seen by Dr. Shelle Iron and that      evaluation is complete.  No further therapy is warranted.  5. Previous cerebrovascular accident.  The patient is on Plavix and      aspirin and so was not taking Aggrenox or Coumadin at this point.  6. Dyslipidemia.  I did review his lipid profile.  His HDL is a little      bit low.  His LDL is excellent.  Triglycerides are fine.  At this      point I will leave him on the medications as listed as he  has been      sensitive to up titration, and I would be leery of increasing the      fenofibrate.  We may do this in the future based on future      readings.  7. Followup.  I will see him back in about 4 months or sooner if      needed.     Rollene Rotunda, MD, [facility]  Electronically Signed    JH/MedQ  DD: 11/13/2007  DT: 11/14/2007  Job #: [id number]   cc:   Sharlot Gowda, M.D.

## 2011-01-09 NOTE — Assessment & Plan Note (Signed)
[facility]                          CHRONIC HEART FAILURE NOTE   [name], [name]                      MRN:          [id number]  DATE:03/27/2007                            DOB:          09-23-1919    PRIMARY CARDIOLOGIST:  Dr. Rollene Rotunda.   PRIMARY CARE:  The patient is followed by Elpidio Galea, PA with Dr.  Susann Givens.   [name] returns today for followup of his congestive heart failure,  etiology not quite clear at this time, most likely ischemic in nature.  The patient has a known history of coronary artery disease, status post  coronary artery bypass grafting in 2007.  Most recent stress Myoview in  May of this year showed an EF of 24%.  Echocardiogram done recently  shows an EF back to 50% to 55%.  [name] states he has been doing  quite well.  I had spoken with Elpidio Galea on one occasion regarding his  ongoing anemia.  The patient is status post blood transfusion within the  last few weeks and states he did well with the transfusion, did not  develop any problems and has a followup appointment with Selena Batten for blood  work on Monday.  The patient is also pending further evaluation of a  followup on an ApneaLink ordered by Dr. Pearlean Brownie and pending sleep study  for abnormal reports per the patient's report.  I have spoken with Mrs.  Vold by phone on several occasions regarding some mild orthostatic  hypotension he has experienced.  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  I have asked him not to proceed with any  further type of physical labor requiring him to stoop or stand up or  down for extended periods of time and he states he has been doing well.  He continues to go to [employer] 2 or 3 days a week and walk around the  store with his wife without problems.   PAST MEDICAL  HISTORY:  1. Congestive heart failure secondary to ischemic cardiomyopathy with      the EF previously 24%, now improved to 50% to 55%.  2. Coronary artery disease, status post CABG.  3. GERD requiring Nexium and Zegerid.  4. Hypertension.  5. Renal insufficiency.  6. Paroxysmal atrial fibrillation, status post CABG.  7. Left frontal lobe CVA, followed by Dr. Sandria Manly.  8. Normocytic anemia, status post blood transfusion.  9. Abnormal ApneaLink, pending sleep study.   REVIEW OF SYSTEMS:  As stated above.   CURRENT MEDICATIONS:  1. Carvedilol 3.125 mg b.i.d.  2. Aggrenox 200/25 b.i.d.  3. Tylenol b.i.d.  4. Synthroid 125 mcg daily.  5. Proscar 5 mg daily.  6. Iron 150 mg b.i.d.  7. Nexium 40 mg daily.  8. Fenofibrate 54 mg daily.  9. Zegerid 40/110 daily.  10.Lipitor 20 mg daily.   PHYSICAL EXAMINATION:  Weight 165, blood pressure 114/58 with a heart  rate of 70.  Mr.  [name] is in no acute distress.  He is his usual pleasant, friendly  self.  No jugular vein distention at 45-degree angle.  LUNGS:  Clear to auscultation bilaterally.  CARDIOVASCULAR:  Exam reveals an S1 and S2, regular rate and rhythm.  ABDOMEN:  Soft and nontender.  Positive bowel sounds.  LOWER EXTREMITIES:  Without clubbing, cyanosis, or edema.  NEUROLOGICAL:  Alert and oriented x3.   IMPRESSION:  Stable heart failure secondary to ischemic cardiomyopathy,  ejection fraction currently 55%, patient tolerating a low-dose beta  blocker.  We will continue current medications.  I hesitate to increase  his beta blocker at this time, as he has experienced some orthostatic  dizziness over the last few months.  He has just completed a 2-unit  blood transfusion and hopefully can titrate his beta blocker next visit,  next month.  His wife knows to call me if he has any problems prior to  that appointment.      Dorian Pod, ACNP  Electronically Signed      Bevelyn Buckles. Bensimhon, MD  Electronically Signed    MB/MedQ  DD: 03/27/2007  DT: 03/28/2007  Job #: [id number]   cc:   Elpidio Galea PA

## 2011-01-09 NOTE — Assessment & Plan Note (Signed)
[facility]                            CARDIOLOGY OFFICE NOTE   JOB, HOLTSCLAW                      MRN:          [id number]  DATE:11/04/2008                            DOB:          [birth year]    PRIMARY CARE PHYSICIAN:  Logan Bores, PA for Dr. Sharlot Gowda.   REASON FOR PRESENTATION:  Evaluate the patient with heart failure.   HISTORY OF PRESENT ILLNESS:  The patient is an 77 years old.  When I saw  him earlier this month, he had decompensated heart failure.  We tried to  keep him out of the hospital by changing from Lasix to torsemide.  Thankfully, he has lost 20 pounds with this regimen.  He is breathing  much better.  He is not having any lower extremity swelling that he was  having.  Meanwhile, his electrolytes have been fine.  He has had no  increased above his chronic renal insufficiency.  His potassium has been  well maintained.  He denies any chest discomfort.  He has had no  palpitation, presyncope, or syncope.   PAST MEDICAL HISTORY:  1. Coronary artery disease (status post CABG with LIMA to LAD; SVG to      first diagonal; SVG to intermediate; SVG to obtuse marginal, and      SVG to PDA in 2007.  He had PCI to an obtuse marginal lesion after      anastomosis of a vein graft insertion.  This was in December 2008      with a 2.25 Taxus stent).  2. Cardiomyopathy (EF 25% at the time of surgery, but improved to 55%.      The last echo again suggested to be somewhat down, but 45% in      January).  3. Gastroesophageal reflux disease.  4. Hypertension.  5. Atrial fibrillation.  6. Chronic renal insufficiency.  7. Left frontal CVA.  8. Chronic anemia.  9. Abnormal sleep study.  10.Leg jerking.   ALLERGIES:  ACE INHIBITORS and ARBs.   MEDICATIONS:  1. Finasteride 5 mg daily.  2. Levothyroxine 100 mcg daily.  3. Plavix 75 mg daily.  4. Iron 325 mg daily.  5. Aspirin 81 mg daily.  6. Fenofibrate 54 mg daily.  7.  Simvastatin 20 mg daily.  8. Coumadin.  9. Coreg 3.125 mg b.i.d.  10.Ranitidine 150 mg b.i.d.  11.Torsemide 20 mg b.i.d.   REVIEW OF SYSTEMS:  As stated in the HPI, and otherwise negative for all  other systems.   PHYSICAL EXAMINATION:  GENERAL:  The patient is pleasant in no distress.  VITAL SIGNS:  Blood pressure 104/49, heart rate 67 and regular, weight  157 pounds, and body mass index 21.  HEENT:  Eyes unremarkable.  Pupils equal, round, and reactive to light.  Fundi not visualized.  Oral mucosa unremarkable.  NECK:  No jugular venous distension at 45 degrees.  Carotid upstroke  brisk and symmetric.  No bruits.  No thyromegaly.  LYMPHATICS:  No cervical, axillary, or inguinal adenopathy.  LUNGS:  Clear to auscultation bilaterally.  BACK:  No costovertebral angle tenderness.  CHEST:  Unremarkable.  HEART:  PMI not displaced or sustained.  S1 and S2 within normal limits.  No S3, no S4, no clicks, no rubs, no murmurs.  ABDOMEN:  Flat, positive bowel sounds, normal in frequency and pitch.  No bruits, no rebound, no guarding.  No midline pulsatile mass.  No  hepatomegaly or splenomegaly.  SKIN:  No rashes.  No nodules.  EXTREMITIES:  A 2+ upper pulse, 1+ dorsalis pedis and posterior tibialis  bilaterally.  No lower extremity edema.  NEUROLOGIC:  Grossly intact.   ASSESSMENT AND PLAN:  1. Cardiomyopathy.  The patient is doing well.  He is much improved on      torsemide.  He has class II symptoms now.  His labs have been      checked and rechecked.  His BUN, creatinine, and potassium have      been stable.  I have checked this again in 10 days.  For now, I am      going to try a titrate his carvedilol to 3.125 in the morning and      6.25 in the evening.  I am going to slowly try to go up on this      again.  We avoid ACE and ARBs secondary to renal insufficiency.  2. Renal insufficiency as above.  We will check his labs in 10 days.  3. Coronary disease.  He is having no new  symptoms.  No further      cardiovascular testing is suggesting.  4. Atrial fibrillation.  He is status post cardioversion.  His pulse      is regular.  I did not get an EKG.  I do not suspect fibrillation      is recurrent.  He will remain on his Coumadin.  5. Hypothyroidism.  I will add a TSH to the next lab draw.  6. Hypertension.  His blood pressure is actually low now and is      managed in the context with treating his cardiomyopathy.  7. Followup.  I will see him back in 1 month or sooner if needed.      Blood work as described.  He also has a Coumadin checkup coming.     Rollene Rotunda, MD, [facility]  Electronically Signed    JH/MedQ  DD: 11/04/2008  DT: 11/05/2008  Job #: [id number]   cc:   Katha Hamming, PA

## 2011-01-09 NOTE — H&P (Signed)
NAME:  [name], [name]               ACCOUNT NO.:  [id number]   MEDICAL RECORD NO.:  [id number]          PATIENT TYPE:  INP   LOCATION:  1825                         FACILITY:  [facility]   PHYSICIAN:  Gerrit Friends. Dietrich Pates, MD, FACCDATE OF BIRTH:  [birth year]   DATE OF ADMISSION:  01/06/2007  DATE OF DISCHARGE:                              HISTORY & PHYSICAL   CHIEF COMPLAINT:  Epigastric and back pain, chest pain.   HISTORY OF PRESENT ILLNESS:  Mr. Hannan is an 77 year old white man with  a past medical history of coronary disease status post five-vessel CABG  in August 2007 who presents to the [facility] ED with a complaint of 5-6  hours of midline epigastric, bilateral scapular, and upper back pain.  The patient awoke around 6:30 a.m. or 7 a.m. this morning and noticed  that he had mild to moderate epigastric pain that was persistent and  constant in nature.  It is described as sore and painful with  radiation to the upper back and bilateral shoulders.  His pain was  relieved by sitting up in bed.  He also drank a Coke and had a large  belch that also relieved the pain somewhat.  However, his pain continued  to persist throughout the late morning and he became very concerned and  somewhat anxious about it.  His pain, he says, that he had with his MI  in 2007 lasted a similar amount of time.  Qualitatively, the patient is  not sure if the current pain was similar to that experienced with his MI  in 2007 in terms of its nature.  However, he and his wife became  concerned that the pain was not relieved with two times subinguinal  nitroglycerin.  EMS was called.  EMS administered one tablet of  sublingual nitroglycerin which the patient said afforded him some relief  of his pain.  His pain is not worsened with exertion.  It is actually  relieved with sitting up as well as walking around his home.  In  general, the patient denies any associated diaphoresis; palpitations;  arm, jaw or neck pain  associated with this current pain.   PAST MEDICAL HISTORY:  1. Coronary artery disease.      a.     Status post non-Q-wave MI in August 2007.      b.     Status post coronary artery bypass grafting, five-vessel, in       August 2007 with a LIMA to the LAD; a saphenous vein  graft to the       first diagonal, ramus intermedius, obtuse marginal, PDA.      c.     Mildly reduced ejection fraction of approximately 45-50% on       echocardiogram in February 2008.      d.     Mild aortic regurgitation, mild mitral regurgitation,       mild/moderate pulmonary regurgitation.  2. History of postoperative atrial fibrillation; transient, resolved.  3. History of hypertension.  4. Hyperlipidemia.  5. Hypothyroidism.  6. History of left frontal CVA October 06, 2006.  7. History of TIA.  8. Bilateral carotid stenosis on the left 40-60%, on the right 40-60%      versus 60-80%.  9. Chronic renal insufficiency with a baseline creatinine of      approximately 1.6.  10.History of a normocytic anemia secondary to chronic renal      insufficiency.  11.Impaired glucose tolerance.  12.BPH.  13.Status post laparoscopic cholecystectomy.  14.History of gastroesophageal reflux disease and chronic gastritis,      status post EGD in December 2005 which was negative for H. pylori,      intestinal metaplasia or malignancy.   MEDICATIONS:  1. Lipitor 20 mg p.o. daily.  2. Proscar 5 mg p.o. daily.  3. Aggrenox one capsule p.o. b.i.d.  4. Ferrous sulfate 50 mg b.i.d.  5. Synthroid 125 mg p.o. daily.  6. Protonix and/or Nexium.  The patient reports that he alternates      these, recently restarting on Protonix December 12, 2006.   REVIEW OF SYSTEMS:  GENERAL:  The patient denies any weaknesses, recent  illnesses, or sick contacts.  CARDIOVASCULAR:  He denies dyspnea on  exertion, shortness of breath, peripheral edema, palpitations,  diaphoresis, radiation, arm and neck or jaw.  PULMONARY:  The patient  denies  shortness of breath, cough, or wheezing.  GI:  The patient denies  nausea, vomiting, diarrhea, melena, or hematemesis.  He does have  epigastric pain, some dyspepsia, and what he describes as heartburn.  NEUROLOGIC:  He denies any acute onset of numbness, weakness, imbalance,  incoordination, or changes in mental status.   PHYSICAL EXAMINATION:  VITAL SIGNS:  Temperature is 96.9, blood pressure  is 140/61, heart rate of 96, respiratory rate of 12, O2 saturations are  100% on room air.  GENERAL:  This is a normal-appearing elderly man in no apparent  distress.  HEENT:  Eyes:  Pupils equal, round, and reactive to light.  Extraocular  muscles intact.  Oropharynx revealed no erythema or exudate.  NECK:  Supple, no JVD, no thyromegaly.  LUNGS:  Clear to auscultation bilaterally.  No wheezes, rhonchi or  rales.  Decreased breath sounds in the right base.  ABDOMEN:  He had active bowel sounds, soft, nontender, nondistended with  no rebound or guarding.  EXTREMITIES:  Reveal no cyanosis, clubbing or edema.  NEUROLOGIC:  Alert and oriented x3.  Cranial nerves II-XII are intact.  He had no appreciable or focal deficits in strength or sensation.  He  did have some decreased short-term and long-term memory deficits.  PSYCHIATRIC:  He was appropriate.   ADMISSION LABORATORY:  White blood cell count 7.6, hemoglobin 10.6,  platelets 153, MCV of 92.  PT 14.9, INR 1.1, PTT was 32.  Point of  cardiac care markers CK-MB 1.0, troponin less than 0.05, myoglobin 73.  Sodium 138, potassium 3.9, chloride 108, bicarb 27, BUN 22, creatinine  1.43, glucose 140, total bilirubin 0.8, alk phos 84, AST 23, ALT 25,  total protein 5.8, albumin 3.1, and calcium 8.6.   Chest x-ray at admission revealed cardiac enlargement, some changes  consistent with COPD with no active cardiopulmonary disease.  EKG revealed a normal sinus rhythm with a rate of approximately 96, a normal axis, PR interval slightly increased at 224,  QRS interval normal  at 86.  There were no T inversions nor ST elevations.  There was one  notable PVC.  There was a question of some possible flattening in leads  II, III, aVF.   ASSESSMENT AND PLAN:  Mr. Woolbright is an 77 year old white man with a known  history of coronary artery disease status post CABG who was admitted  with:  1. Epigastric pain with radiation to the bilateral shoulders and upper      back.  Etiology of this is not entirely clear at this point.  Given      his history of coronary artery disease we will want to rule out any      acute form of ischemia.  Thus far his point-of-care cardiac enzymes      are negative.  His EKG shows no overt signs of acute ischemia.  We      will plan to admit the patient to telemetry for continuous cardiac      monitoring while we cycle his cardiac markers.  His story and pain      certainly have some atypical features, most notably that it is not      worse but actually improved with exertion.  However, he may warrant      further workup with a Myoview if he rules out.  In addition, given      his symptoms and clinical presentation, history of gastritis as      well reflux disease, a GI etiology may be present.  The patient      certainly has some features consistent with dyspepsia.  CMET was      reviewed and LFTs are normal.  The patient is status post      laparoscopic cholecystectomy and LFTs are normal which would      suggest there is a very low liklihood of biliary pathology or a      common bile duct stone.  No nausea or vomiting; however, I am going      to check a lipase which is pending at this point.  I addition, I      will ask [facility] for their input as well as he may warrant further      workup for an upper GI source of his pain with possible EGD once      cardiac etiologies are ruled out.   1. Coronary artery disease.  See above.  At this point I will continue      his statin, aspirin, and Aggrenox.  I have reviewed  his      documentation from Dr. Antoine Poche and the patient has really not      tolerated a beta blocker or ACE inhibitor in the past secondary to      persistent hypotension.  At this point I will continue to hold beta      blocker as well as ACE.  Apparently he had just had his Norvasc      discontinued as well in April 2008 secondary to persistently low      blood pressures.  2. Gastritis/gastroesophageal reflux disease.  Again, will ask [facility] to evaluate him.  In the interim will treat him with a b.i.d.      proton pump inhibitor.  3. Hypothyroidism.  Will continue Synthroid.  Check a TSH, free T4.  4. Benign prostatic hypertrophy.  Will continue his Proscar.  5. Anemia, normocytic.  Will continue his iron therapy.  6. Chronic renal insufficiency.  His creatinine today is 1.43 which is      actually improved from his reported baseline.  7. Cerebrovascular disease status post left cerebrovascular accident.     Again, will continue aspirin, Aggrenox,  Lipitor.  No current signs      or symptoms to suggest any new form of deficit.      Antony Contras, M.D.  Electronically Signed      Gerrit Friends. Dietrich Pates, MD, [facility]  Electronically Signed    GL/MEDQ  D:  01/06/2007  T:  01/06/2007  Job:  [id number]   cc:   Everardo All. Madilyn Fireman, M.D.  Rollene Rotunda, MD, [facility]  Sharlot Gowda, M.D.

## 2011-01-09 NOTE — Assessment & Plan Note (Signed)
[facility]                            CARDIOLOGY OFFICE NOTE   [name], [name]                      MRN:          [id number]  DATE:03/25/2008                            DOB:          30-Sep-1919    PRIMARY CARE PHYSICIAN:  Sharlot Gowda, MD.   REASON FOR PRESENTATION:  The patient with coronary artery disease.   HISTORY OF PRESENT ILLNESS:  The patient is now 77 years old.  He is a  pleasant gentleman with a history of coronary disease as described.  He  is graduated from cardiac rehab.  He is trying to do walking at [employer] 2  days a week.  With this level of activity, he denies any chest  discomfort, neck discomfort, arm discomfort, activity-induced nausea,  vomiting, or excessive diaphoresis.  He has had no palpitation,  presyncope, or syncope.   PAST MEDICAL HISTORY:  1. Congestive heart failure secondary to ischemic cardiomyopathy (EF      had been 24%.  Most recently it was 55% on echo).  2. Coronary artery disease (he had a CABG in 2007 with a LIMA to the      LAD, SVG to first diagonal, ramus intermediate, obtuse marginal,      and PDA.  Status post PCI of a 90% obtuse marginal lesion following      the anastomosis of the vein graft.  This is managed with a 2.25      Taxus stent).  3. Gastroesophageal reflux disease.  4. Hypertension,  5. Chronic renal insufficiency.  6. Paroxysmal atrial fibrillation.  7. Left frontal CVA.  8. Anemia.  9. Abnormal sleep study (he was told he did not have sleep apnea by      Dr. Shelle Iron).   ALLERGIES/INTOLERANCES:  ACE INHIBITOR and ARB.   MEDICATIONS:  1. Carvedilol 3.125 mg daily.  2. Simvastatin 20 mg daily.  3. Fenofibrate 54 mg daily.  4. Aspirin 325 mg daily.  5. Iron 325 mg daily.  6. Plavix 75 mg daily.  7. Omeprazole 20 mg daily.  8. Levothroid 100 mcg daily.  9. Finasteride 5 mg daily.   REVIEW OF SYSTEMS:  As stated in the HPI and otherwise negative for  other systems.   PHYSICAL EXAMINATION:  GENERAL:  The patient is a frail appearing, but  in no distress.  VITAL SIGNS:  Blood pressure 119/62, heart rate 67 and regular, weight  165 pounds.  NECK:  No jugular venous distention at 45 degrees, carotid upstroke  brisk and symmetric, no bruits, no thyromegaly.  LUNGS:  Clear to auscultation bilaterally.  BACK:  Kyphoscoliosis.  CHEST:  A well-healed sternotomy scar.  HEART:  PMI not displaced or sustained.  S1 and S2 within normal limits.  No S3, no S4, no clicks, no rubs, no murmurs.  ABDOMEN:  Flat, positive bowel sounds normal in frequency and pitch.  No  bruits, no rebound, no guarding, no midline pulsatile mass, no  organomegaly.  SKIN:  No rashes, no nodules.  EXTREMITIES:  2+ pulses, no edema.   EKG:  Sinus rhythm, rate 70, axis within normal  limits, intervals within  normal limits, no acute ST-wave changes.   ASSESSMENT AND PLAN:  1. Coronary disease.  He is having no further chest discomfort.  No      further cardiac evaluation is warranted.  He will continue with      secondary risk reduction.  2. Cardiomyopathy.  His ejection fraction is much improved.  Because      of fatigue and lightheadedness, I will not titrate his meds      further.  3. Renal insufficiency, as this is followed by Dr. Kathrene Bongo and      his creatinine has been stable.  4. Dyslipidemia.  He had labs drawn recently.  I will try to obtain      these, so that I can review his HDL as I would titrate his      fenofibrate if he still has low HDL.  5. Followup.  I will see him back in 6 months or sooner if needed.     Rollene Rotunda, MD, [facility]  Electronically Signed    JH/MedQ  DD: 03/25/2008  DT: 03/26/2008  Job #: [id number]   cc:   Sharlot Gowda, M.D.

## 2011-01-09 NOTE — Assessment & Plan Note (Signed)
[facility]                          CHRONIC HEART FAILURE NOTE   [name], [name]                      MRN:          [id number]  DATE:05/05/2007                            DOB:          [birth year]    PRIMARY CARDIOLOGIST:  Dr. Antoine Poche.   PRIMARY CARE:  Elpidio Galea, PA with Dr. Susann Givens.   [name] returns today for followup with his congestive heart failure,  etiology not quite clear, most likely ischemic in nature.  [name]  states he has been doing quite well.  He has recently been followed by  primary care physician/PA for anemia, status post recent blood  transfusion.  He is also being followed by Dr. Annie Sable for  some renal insufficiency.   The most recent lab work I have available is from August 5, status post  transfusion with hemoglobin 12.0 and 38.1 on hematocrit, potassium 5.2,  BUN 46, creatinine 2.07 with a BNP of 646.   [name] states he has been doing quite well.  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  Apparently, there was concern about his blood  pressure being lower than advised, with a diastolic in the 40s per  patient's wife's report, and patient's carvedilol evening dose was put  on hold by Dr Kathrene Bongo.  Otherwise, [name] denies any new  problems.   PAST MEDICAL HISTORY:  1. Congestive heart failure secondary to questionable ischemic      cardiomyopathy with an ejection fraction, previously, of 24%, now      improved to 55%.  2. Coronary artery disease, status post coronary artery bypass      grafting.  3. Gastroesophageal reflux disease requiring Nexium and Zegerid.  4. History of hypertension.  5. Renal insufficiency.  6. Paroxysmal atrial fibrillation, immediate postoperative      complication.  7. Left frontal lobe CVA, followed by Dr. Sandria Manly.  8. Normocytic anemia, status post recent blood transfusion.  9.  Abnormal apneic link , pending sleep study.   REVIEW OF SYSTEMS:  As stated above.   CURRENT MEDICATIONS:  1. Carvedilol 3.125 daily.  2. Aggrenox 200/25 b.i.d.  3. Tylenol daily.  4. Synthroid 125 mcg daily.  5. Proscar 5 mg daily.  6. Iron 150 mg b.i.d.  7. Nexium 40 mg daily.  8. Fenofibrate 54 mg daily.  9. Zegerid 40/1100 daily.  10.Lipitor 12 daily.   PHYSICAL EXAMINATION:  Weight 167, blood pressure 110/56 with a heart  rate of 66.  [name] is in no acute distress.  He is his usual pleasant self.  No jugular vein distention at 45 degree angle.  LUNGS:  Clear to auscultation.  CARDIOVASCULAR EXAM:  Reveals an S1 and S2, occasional early beat.  ABDOMEN:  Soft, nontender, positive bowel sounds.  LOWER EXTREMITIES:  Without clubbing, cyanosis, or edema.  NEUROLOGICALLY:  Alert and oriented x3.   IMPRESSION:  Stable heart failure at this time without signs of volume  overload.  Will continue beta blocker therapy at current dose  as patient  has experienced some drastic fluctuations in diastolic pressure at home.  Pending further renal workup by Dr. Kathrene Bongo per patient's wife's  report.  Will continue medications, otherwise.      Dorian Pod, ACNP  Electronically Signed      Rollene Rotunda, MD, [facility]  Electronically Signed   MB/MedQ  DD: 05/05/2007  DT: 05/06/2007  Job #: [id number]   cc:   Cecille Aver, M.D.  Elpidio Galea, PA

## 2011-01-09 NOTE — Consult Note (Signed)
[name] [name], [name] NO.:  [id number]   MEDICAL RECORD NO.:  [id number]          PATIENT TYPE:  INP   LOCATION:  4742                         FACILITY:  [facility]   PHYSICIAN:  Bernette Redbird, M.D.   DATE OF BIRTH:  1920-04-18   DATE OF CONSULTATION:  01/06/2007  DATE OF DISCHARGE:                                 CONSULTATION   GASTROENTEROLOGY CONSULTATION:  [facility], Dr. Marlton Bing, attending, asked [place] to  see this delightful 77 year old gentleman known to my partner, Dr. Dorena Cookey, because of chest pain possibly of GI tract origin.   [name] [name] [name] had previously had reflux-type symptoms which ironically  turned into coronary disease culminating in bypass surgery last August,  after which the symptoms resolved.  More recently, he began to have  recurrent symptoms with nocturnal epigastric discomfort going up into  his chest, and, for the past several weeks, it has been really bothering  him at night and forced him to sleep in the recliner despite use of  Nexium at bedtime.  He had previously been on Protonix.  He decided to  come to the emergency room when he started having pain in his shoulder  blades which was reminiscent of his previous coronary pain.   The patient has not been having late night meals, although he sometimes  does have a bedtime snack.  No problem with dysphagia.   The patient had endoscopic evaluation by Dr. Sabino Gasser in December of  2005, at which time there was questionable short segment Barrett's  esophagus, biopsies of which showed no evidence of intestinal metaplasia  or true Barrett's.   PAST MEDICAL HISTORY:  NO KNOWN DRUG ALLERGIES.   OUTPATIENT MEDICATIONS:  1. Aggrenox.  2. Iron.  3. Synthroid.  4. Lipitor.  5. Proscar.  6. Synthroid.  7. Nexium (he states he has been using this on a consistent basis for      the past month).   OPERATIONS:  1. Laparoscopic cholecystectomy several years ago.  2.  Bypass surgery in August of 2007.   MEDICAL PROBLEMS:  1. Coronary disease.  2. History of apparent TIA in February of this year.  3. Chronic renal insufficiency.  4. Impaired glucose tolerance.  5. Peripheral neuropathy.  6. Anemia.  7. Prostatism.  8. Hypothyroidism.   HABITS:  Nonsmoker, nondrinker.   FAMILY HISTORY:  Negative for GI illnesses.   SOCIAL HISTORY:  Married.  I spoke with his wife this evening by  telephone at home.  He is a retired Systems developer person.   REVIEW OF SYSTEMS:  Negative for anorexia, involuntary weight loss,  dysphagia chronic abdominal pain or nausea, constipation, diarrhea or  rectal bleeding.  Note that he did have a colonoscopy by Dr. Madilyn Fireman in  2003 which showed a colonic adenoma.   PHYSICAL EXAMINATION:  GENERAL:  A pleasant, articulate friendly  Caucasian male in no evident distress, neither anxious nor depressed.  HEENT:  He is anicteric without pallor.  CHEST:  Clear.  HEART:  Normal without gallops, rubs, murmurs, clicks or arrhythmias  appreciated.  ABDOMEN:  Soft and nontender without succussion splash, guarding, mass  or tenderness.  Scaphoid configuration.  RECTAL:  Exam by the physician assistant showed heme-negative stool.   LABORATORY DATA:  White count 7600, hemoglobin 10.1 with normal MCV but  slightly elevated RDW of 14.3.  Differential count unremarkable.  INR  1.1.  Chemistry panel shows glucose of 140.  Normal BUN and creatinine.  Normal liver chemistries.  Albumin 3.1, lipase normal at 17.  Troponin I  negative x1.  CK and CK-MB negative x1.   IMPRESSION:  By history, I am fairly sure that this patient's epigastric  pain with radiation to the back is secondary to reflux, perhaps with an  element of esophageal spasm (it did seem to get better with  nitroglycerin he used at home).   PLAN:  Begin with cardiology clearance.  Once they feel it is okay for  the patient to undergo endoscopic evaluation, I would like to do  an EGD  looking for endoscopic evidence of acid reflux, keeping in mind that  often patients with severe reflux will have essentially normal  endoscopies.  It is then anticipated the patient would be treated  empirically with Zegerid at bedtime while continuing Nexium to be taken  in the morning.  Sometimes this dual agent therapy as affective for  patients such as [name] [name] [name] who appear to have a nocturnal predilection  of symptoms.           ______________________________  Bernette Redbird, M.D.     RB/MEDQ  D:  01/06/2007  T:  01/07/2007  Job:  [id number]   cc:   Sharlot Gowda, M.D.  Rollene Rotunda, MD, [facility]

## 2011-01-12 NOTE — Consult Note (Signed)
NAME:  [name], [name]               ACCOUNT NO.:  [id number]   MEDICAL RECORD NO.:  [id number]          PATIENT TYPE:  INP   LOCATION:  5730                         FACILITY:  [facility]   PHYSICIAN:  Genene Churn. Love, M.D.    DATE OF BIRTH:  17-Mar-1920   DATE OF CONSULTATION:  10/03/2006  DATE OF DISCHARGE:                                 CONSULTATION   This 77 year old, right-handed white married male has a known history of  hypertension.  He had had a non-Q-wave myocardial infarction after five-  vessel coronary artery bypass surgery in August 2007.  At that time, he  also had postoperative transient atrial fibrillation and was on  amiodarone.  He was in his usual state of health until this morning,  when he awoke with right face, arm, and leg numbness.  He came to [facility] Emergency Room, where an MRI study of the brain shows  small left frontal ischemic stroke, dotlike in size, with MRA showing  distal right vertebral stenosis and possibly right MCA stenosis as well.  The ventricles were mildly enlarged.  There was evidence of diffuse  atrophy, and he had evidence of diffuse small vessel ischemic disease.  He has no known prior history of stroke, and denies any history of  associated chest pain, palpitations, or known amaurosis fugax.  He does  not smoke cigarettes, nor does he drink alcohol.   HIS PAST MEDICAL HISTORY:  1. Coronary artery disease, status post five-vessel coronary artery      bypass surgery in August 2007.  2. He has had renal insufficiency.  3. He had transient postoperative atrial fibrillation.  4. He has had postoperative dysphagia.   CURRENT MEDICATIONS AT THE TIME OF THIS EVALUATION:  1. Aspirin 1 per day.  2. Lipitor, dosage unknown, 1 per day.  3. Norvasc 5 mg per day.  4. Cozaar 5 mg per day.   PHYSICAL EXAMINATION:  GENERAL:  Well-developed white male, tall, thin.  VITAL SIGNS:  Blood pressure right arm and left arm 120/80, heart rate  was 76  and regular.  GENERAL:  He had a right carotid bruit.  NEUROLOGIC:  Neck flexion/extension maneuvers revealed restricted  motion.  Mental status:  He was alert, oriented x3.  His cranial nerve  examination revealed visual fields full.  Disks flat.  Extraocular  movements full.  Corneals present.  No seventh nerve palsy.  Hearing  decreased, air conduction greater than bone conduction.  Tongue midline.  Uvula midline.  Gag is present.  Sternocleidomastoid and trapezius  testing normal.  Motor examination revealed good strength in the upper  and lower extremities. Very poor heel-to-shin bilaterally. Decreased  vibration sense in lower extremities, and decreased pinprick in lower  extremities.  He also had absent ankle jerks.  Plantar responses were  downgoing.   IMPRESSION:  1. Left frontal stroke by magnetic resonance imaging, code 434.91.  2. Coronary artery disease, status post five-vessel coronary artery      bypass surgery in August 2007.  3. Transient postoperative atrial fibrillation, code 427.31.  4. Hypertension, code 796.2.  5. Gastroesophageal reflux disease, code unknown.  6. Peripheral neuropathy, code 356.9.   PLAN:  The plan at this time is to add Plavix or switch to Aggrenox.  Start workup for stroke with Doppler study of the pros, 2D  echocardiogram, lipid profile, and homocysteine level.           ______________________________  Genene Churn. Sandria Manly, M.D.     JML/MEDQ  D:  10/03/2006  T:  10/04/2006  Job:  [id number]   cc:   [facility]  Milon Dikes, M.D.

## 2011-01-12 NOTE — Assessment & Plan Note (Signed)
[name]                              CARDIOLOGY OFFICE NOTE   [name], [name]                      MRN:          [id number]  DATE:05/27/2006                            DOB:          [birth year]    PRIMARY:  Dr. Sharlot Gowda   REASON FOR PRESENTATION:  Evaluate patient with a non-Q-wave myocardial  infarction status post CABG.   HISTORY OF PRESENT ILLNESS:  The patient presents for followup.  At the last  appointment he had significant lower extremity swelling and I gave him 2  weeks of diuretic.  We followed up a BMET.  His legs are much improved.  He  did see the surgeons after that and was started on a course of antibiotics.  He has had much less swelling in his legs.  He is not having any fevers or  chills.  He is having much less incisional pain.  He is not having any  cough.  He has none of the chest discomfort that prompted his  catheterization and bypass.  He has no neck or arm discomfort.  He has no  palpitations, presyncope or syncope.  He is about to start cardiac rehab.   PAST MEDICAL HISTORY:  1. Coronary artery disease status post CABG (LIMA to the LAD, SVG to      diagonal, sequential SVG to ramus intermediate and obtuse marginal, SVG      to the PDA).  2. Postoperative atrial fibrillation.  3. Gastroesophageal reflux disease.  4. Hypertension.   ALLERGIES:  None.   MEDICATIONS:  1. Lipitor 20 mg a day.  2. Proscar 5 mg a day.  3. Protonix 40 mg a day.  4. Aspirin 325 mg a day.  5. Multivitamin.  6. Amiodarone 200 mg a day.   REVIEW OF SYSTEMS:  As stated in the HPI and otherwise negative for other  systems.   PHYSICAL EXAMINATION:  GENERAL:  The patient is in no distress.  VITAL SIGNS:  Blood pressure 109/50, heart rate 65 and regular, weight 168  pounds, body mass index 25.  HEENT:  Eyelids unremarkable.  Pupils equal, round, and react to light.  Fundi not visualized.  Oral mucosa unremarkable.  NECK:  No  jugular venous distention, waveform within normal limits, carotid  upstroke brisk and symmetric.  No bruits, thyromegaly.  LYMPHATICS:  No adenopathy.  LUNGS:  Clear to auscultation bilaterally.  BACK:  No costovertebral angle tenderness.  CHEST:  Healing sternotomy scar.  HEART:  PMI not displaced or sustained.  S1 and S2 within normal limits.  No  S3, no S4, no murmurs.  ABDOMEN:  Flat, positive bowel sounds normal in frequency and pitch.  No  bruits, rebound, guarding.  No midline pulsatile mass.  No hepatomegaly, no  splenomegaly.  SKIN:  No rashes, no nodules.  EXTREMITIES:  Show 2+ pulses, mild bilateral lower extremity edema.  Bilateral saphenous vein graft harvest sites without erythema or exudate.  He still has some firm but nonfluctuant hematomas above the knees and medial  thighs bilaterally.  NEUROLOGIC:  Oriented to person, place and  time.  Cranial nerves II-XII  grossly intact.  Motor grossly intact.   ASSESSMENT AND PLAN:  1. Coronary disease.  He is having no symptoms related to this.  No      further cardiovascular testing is suggested.  He will continue with      risk reduction.  2. Atrial fibrillation.  He has had no symptomatic dysrhythmias.  At this      point I will discontinue the amiodarone.  We went over the      risks/benefits of continuing this drug versus discontinuing it.  I      think the risk profile favors stopping the drug with a low potential      for recurrent atrial fibrillation.  3. Lower extremity swelling.  This is improved.  He will continue the      medications as listed.  4. Renal insufficiency.  I will check a creatinine today as he does have      some mild chronic renal insufficiency.  5. Followup.  I would like to see him back in about 2 months.  He is going      to participate in cardiac rehab.            ______________________________  Rollene Rotunda, MD, [facility]     JH/MedQ  DD:  05/27/2006  DT:  05/28/2006  Job #:  [id number]    cc:   Sharlot Gowda, M.D.

## 2011-01-12 NOTE — Assessment & Plan Note (Signed)
[facility]                            CARDIOLOGY OFFICE NOTE   LINELL, SHAWN                      MRN:          [id number]  DATE:10/14/2006                            DOB:          22-Nov-1919    PRIMARY CARE PHYSICIAN:  Sharlot Gowda, M.D.   REASON FOR PRESENTATION:  Evaluate patient with recent stroke.   HISTORY OF PRESENT ILLNESS:  The patient was hospitalized twice this  month. On the first admission, he was hospitalized on February 7th and  subsequently diagnosed with a left frontal lobe cerebral vascular  accident. He had no significant residual from this. He did have  recurrent symptoms on February 12th and was thought potentially to have  a TIA. He is being treated with Aggrenox.   Aside from this, he had been doing well. He had completed cardiac rehab.  He has not been having any chest discomfort or neck discomfort. He has  not been having any shortness of breath and denies any PND or orthopnea.  He has had no palpitations, pre-syncope or syncope.   Of note, he had atrial fibrillation after his bypass, but no recurrence  of this. He was off of his amlodipine. He was not thought to have a  dysrhythmia related to his stroke and was monitored on telemetry without  any evidence of a dysrhythmia.   He did have some moderate distal right vertebral artery stenosis. He had  40-60% internal carotid artery stenosis on the left and 40-80% on the  right. This was managed medically. His EF was unchanged from surgery (45-  58%).   PAST MEDICAL HISTORY:  1. Coronary artery disease, status post non-Q wave myocardial      infarction.  2. Coronary artery bypass graft (Left internal mammary artery (LIMA)      to the left anterior descending artery (LAD); saphenous vein graft      (SVG) to the first diagonal; sequential saphenous vein graft (SVG)      to ramus intermediate and obtuse marginal; saphenous vein graft      (SVG) to posterior  descending artery (PDA).  3. Post operative atrial fibrillation.  4. Gastroesophageal reflux disease.  5. Hypertension.  6. Renal insufficiency.   ALLERGIES:  None.   MEDICATIONS:  1. Lipitor 20 mg daily.  2. Proscar 5 mg daily.  3. Norvasc 5 mg daily.  4. Aggrenox 25/200 b.i.d.  5. Tylenol.  6. Synthroid 100 mcg daily.   REVIEW OF SYSTEMS:  As stated in the HPI and otherwise negative for  other systems.   PHYSICAL EXAMINATION:  The patient is in no distress. Blood pressure is  98/43, heart rate 65 and regular.  HEENT: Eyelids unremarkable. Pupils equal, round, and reactive to light.  Fundi not visualized.  NECK: No jugular venous distention at 45-degrees. Carotid upstroke brisk  and symmetrical. No bruits, no thyromegaly.  LYMPHATICS: No adenopathy.  LUNGS: Clear to auscultation bilaterally.  BACK: No costovertebral angle tenderness.  CHEST: Well-healed sternotomy scar.  HEART: PMI not displaced or sustained. S1, S2 within normal limits. No  S3. No S4. No clicks, rubs,  murmurs.  ABDOMEN: Flat, positive bowel sounds, normal in frequency and pitch. No  bruits. No rebounds. No guarding. No midline pulsatile mass. No  hepatomegaly, splenomegaly.  SKIN: No rashes, no nodules.  EXTREMITIES: 2+ upper pulses and 1+ dorsalis pedis and posterior  tibialis bilaterally; mild bilateral ankle edema, bilateral saphenous  vein graft harvest sites unremarkable.  NEURO: Oriented to person, place and time. Cranial nerves II-XII grossly  intact. Motor grossly intact.   EKG: Sinus rhythm, rate 65, axis within normal limits, intervals within  normal limits. No acute ST-T wave changes.   ASSESSMENT/PLAN:  1. Hypertension: The patient's blood pressure has actually been      falling over recent visits and I am going to decrease his Norvasc      2.5 mg daily and we may be able to get rid of this completely.  2. Stroke/transient ischemic attack: The patient will remain on his      Aggrenox.   3. Coronary disease: He is having no symptoms related to this. No      further cardiovascular testing is suggested. He will continue      aggressive risk reduction.  4. Followup: I will see him back in about 2 months, sooner if he has      any recurrent issues.     Rollene Rotunda, MD, [facility]  Electronically Signed    JH/MedQ  DD: 10/14/2006  DT: 10/14/2006  Job #: [id number]   cc:   Sharlot Gowda, M.D.

## 2011-01-12 NOTE — Discharge Summary (Signed)
NAMEROYCE, STEGMAN NO.:  [id number]   MEDICAL RECORD NO.:  [id number]          PATIENT TYPE:  INP   LOCATION:  2036                         FACILITY:  [facility]   PHYSICIAN:  Theda Belfast, PA DATE OF BIRTH:  [birth year]   DATE OF ADMISSION:  03/27/2006  DATE OF DISCHARGE:                                 DISCHARGE SUMMARY   Audio too short to transcribe (less than 5 seconds)      Theda Belfast, PA     KMD/MEDQ  D:  04/04/2006  T:  04/04/2006  Job:  [id number]

## 2011-01-12 NOTE — Consult Note (Signed)
[name], [name]               ACCOUNT NO.:  [id number]   MEDICAL RECORD NO.:  [id number]          PATIENT TYPE:  INP   LOCATION:  1826                         FACILITY:  [facility]   PHYSICIAN:  Genene Churn. Love, M.D.    DATE OF BIRTH:  [birth year]   DATE OF CONSULTATION:  10/08/2006  DATE OF DISCHARGE:                                 CONSULTATION   This 77 year old right-handed white married male with a history of  hypertension has a history of known coronary artery disease and had a  myocardial infarction which was non-Q-wave MI in August 2007.  He had a  postoperative atrial fibrillation at that time.  He had a left brain  stroke October 03, 2006, with complaints of right face, arm and leg  numbness and at that time was found to have evidence of a small  diffusion weighted positive area in the left frontal region.  His MRA  showed evidence of right vertebral distal stenosis and possibly right  MCA stenosis as well.  At the time, he was on aspirin and Plavix was  added to his regimen.  Workup in the hospital included Doppler studies  of the carotids which gave a variable report of 40-60% left ICA stenosis  and 40-60 versus 60-80% right ICA stenosis.  A 2-D echocardiogram showed  diffuse ventricular dysfunction with 45-50%.  Homocysteine level was in  the normal range.  LDL was 49, HDL 40, triglycerides 27, cholesterol 94.  Hemoglobin A1c was 6.2.  He was discharged on aspirin and Plavix and  other medicines include Proscar 5 mg daily, Norvasc 5 mg daily, Lipitor  20 mg daily.  He subsequently had a TSH return following his discharge  of 28.  He was in his usual state of health until about 1:00 a.m. this  morning when he developed right leg, greater than right arm, numbness  and came to the emergency room where CT scan of the brain showed  evidence of some small vessel disease and increase in the ventricular  size but no other significant abnormalities.  No changes versus previous  CT  scan October 03, 2006.  His white blood cell count was 04540,  hemoglobin 11.7, hematocrit 34.2, platelets 156,000.  Sodium 140,  potassium 4, chloride 107, BUN 31, creatinine 1.5, glucose 101.   PHYSICAL EXAMINATION:  GENERAL APPEARANCE:  A well-developed thin white  male in no acute stress.  VITAL SIGNS:  Blood pressure right and left arm 140/80, heart rate was  82 and irregular.  He had a right supraclavicular bruit.  I could not  hear a right carotid bruit, although intermittently I questioned one.  NEUROLOGIC:  MENTAL STATUS:  He is alert, oriented x3, followed one, two  and three-step commands.  Cranial nerve examination revealed visual  fields full, disks flat.  Extraocular movements full.  Corneals present.  No seventh nerve palsy.  Hearing decreased, air conduction greater than  bone conduction.  Tongue midline.  Uvula midline.  Gags present.  Sternocleidomastoid and trapezius testing normal.  Motor examination  revealed good strength of the upper and lower extremities.  Diffuse  outstretched hand and arm tremor, decreased vibration sense in his lower  extremities.  Sensory examination revealed decreased vibration sense in  his lower extremities.  Joint position intact.  Pinprick present.  Deep  tendon reflexes 2+ with absent ankle jerks.  Plantar responses  downgoing.   IMPRESSION:  1. Suspect recurrent left brain transient ischemic attacks, code 435.9      versus stroke 434.01.  2. Hypothyroidism, code 344.9.  3. Coronary artery disease, code 429.2, status post five-vessel      coronary artery bypass surgery.  4. Myocardial infarction just prior to his five-vessel coronary artery      bypass surgery, code 429.2.  5. Transient atrial fibrillation postoperatively, code 247.31.  6. Hypertension, code, code 796.2.  7. Hyperlipidemia, currently on therapy, code 272.4.   PLAN AT THIS TIME:  To admit the patient for MRI study.           ______________________________   Genene Churn. Sandria Manly, M.D.     JML/MEDQ  D:  10/08/2006  T:  10/08/2006  Job:  [id number]   cc:   Sharlot Gowda, M.D.

## 2011-01-12 NOTE — Discharge Summary (Signed)
[name], [name]               ACCOUNT NO.:  [id number]   MEDICAL RECORD NO.:  [id number]          PATIENT TYPE:  INP   LOCATION:  5730                         FACILITY:  [facility]   PHYSICIAN:  Lonia Blood, M.D.DATE OF BIRTH:  24-Mar-1920   DATE OF ADMISSION:  10/03/2006  DATE OF DISCHARGE:  10/06/2006                               DISCHARGE SUMMARY   PRIMARY CARE PHYSICIAN:  Dr. Sharlot Gowda.   CARDIOLOGIST:  Dr. Angelina Sheriff.   NEUROLOGIST:  Dr. Melbourne Abts.   DISCHARGE DIAGNOSES:  1. Left frontal lobe cerebrovascular accident.      a.     Evidence of small vessel disease on MRI, MRA.      b.     Moderate to severe stenosis distal right vertebral artery,       not clinically significant.      c.     A 40-60% internal carotid artery stenosis on the left and 40-       60 versus 60-80% stenosis of the right internal carotid artery       with vertebral flow antegrade.      d.     Two-D echocardiogram revealing ejection fraction of 45-50%       with hypokinesis of the inferior posterior wall, left atrium       dilatation and possible mild pulmonic insufficiency.      e.     Normal homocysteine and favorable lipid panel.      f.     Plavix added to preexistent aspirin therapy.      g.     No lasting physical deficits.  2. Coronary artery disease status post coronary artery bypass      grafting.  3. Hyperlipidemia.  4. Hypertension.  5. Recent history of transient atrial fibrillation - normal sinus      rhythm throughout this hospitalization.  6. Chronic renal insufficiency with baseline creatinine approximately      1.6.  7. Normocytic anemia - felt to be anemia due to renal insufficiency.  8. Mild dehydration - resolved.  9. Impaired glucose tolerance - outpatient monitoring suggested.   DISCHARGE MEDICATIONS:  1. Plavix 75 mg daily.  2. Aspirin 325 mg daily.  3. Proscar 5 mg daily.  4. Norvasc 5 mg daily.  5. Lipitor 20 mg daily.   FOLLOWUP:  The patient is  advised to follow up with Dr. Melbourne Abts for  ongoing neurologic care in 1 month.  He is advised to follow up with Dr.  Susann Givens on an as needed basis.  He is advised to follow up with Dr.  Antoine Poche as per his regular scheduled visits.  Consideration could be  given to placing the patient on a beta-blocker if there is no  contraindication to this.  It is also recommended that his primary care  physician follow up his serum glucose, as it is possible that this  patient could develop significant hyperglycemia within the next 6 months  to a year.   PROCEDURES:  1. CT scan of the head - enlarged ventricles.  2. MRI and MRA of  the brain - Moderate to severe stenosis distal right      vertebral artery.  Otherwise, no significant vascular disease.      Mild diffuse ventricular enlargement.  Small acute infarct left      frontal lobe.   TWO-D ECHOCARDIOGRAM:  Results as noted above.   CAROTID DOPPLERS BILATERALLY:  Results as noted above.   CONSULTATIONS:  [facility].   HISTORY OF PRESENT ILLNESS:  For details concerning presenting history,  please see dictated History and Physical by Dr. Hadley Pen dated October 03, 2006, labeled Job #[id number].   HOSPITAL COURSE:  [name] is a very pleasant 77 year old  gentleman who presented to the hospital on October 03, 2006, with  complaints of tingling and numbness in the right arm as well as the  right leg.  At the time of his presentation an emergent CT scan of the  head was obtained.  There was no evidence of an acute abnormality.  An  MRI and MRA of the brain was obtained and the patient was admitted for  probable acute stroke.  The MRI revealed a small left frontal CVA.  Thromboembolic evaluation was carried out with an echocardiogram not  revealing any evidence of intracardiac source of thrombus formation.  Carotid Dopplers were obtained and did not reveal a significant degree  of stenosis.  A homocysteine level was  obtained and was found to be  unremarkable.  A lipid panel was obtained and it is notable that the  patient's lipids are under good control on his current medications.  Fortunately, within a few hours the patient's symptoms had completely  resolved.  He was left with no long term neurologic deficits.  Throughout remainder of hospitalization no further neurologic deficits  were encountered and no further symptoms were encountered.  The  patient's vertebral artery stenosis was not felt to be clinically  related.  No further neurologic evaluation was felt to be necessary.  The patient was cleared for discharge home.   At the time of presentation the patient was noted to be slightly  dehydrated.  He was gently rehydrated.  Prior to accomplishing a  euvolemic state patient did have a couple of episodes of  lightheadedness.  With gentle IV fluid resuscitation over a 48 hour  period, this resolved completely.  At the time of discharge patient is  ambulating without difficulty and feels stable on his feet.   It should be noted that this patient does suffer with an element of  chronic renal insufficiency.  His baseline creatinine appears to be  anywhere from 1.4 to possibly as high as 1.6.  During this hospital stay  the patient's creatinine remained stable around 1.3 to 1.5.  With gentle  hydration BUN did increase to 27.  His current creatinine of 1.3, with a  BUN of 26, is felt to be his euvolemic state.  The patient does suffer  with a normocytic anemia which is felt to be due to a probable low  erythropoietin state.  Hemoglobin is at 10.7, however, and therefore  erythropoietin is not presently indicated.   At the time of the patient's presentation a basic metabolic panel  revealed a glucose of 124.  This is abnormally high.  As a result, CBGs  were monitored routinely during the hospital stay without administration of insulin.  Hemoglobin A1c was obtained and was noted to be mildly   elevated at 6.2.  The patient does not previously have a diagnosis of  diabetes mellitus.  A true fasting CBG was not obtained during this  hospital stay.  At this time I hesitate to give the patient a formal  diagnosis of diabetes mellitus.  I will label him as a patient with  impaired glucose tolerance and suggest that outpatient CBGs be followed  closely.  Clearly, medical management is not indicated at this time as  CBG remained at 120-126 throughout our monitoring in the hospital.   No further complications were encountered concerning Mr. Hazen' other  significant medical history.  On October 06, 2006, with stable vital  signs, the patient was cleared for discharge home.      Lonia Blood, M.D.  Electronically Signed     JTM/MEDQ  D:  10/06/2006  T:  10/06/2006  Job:  [id number]   cc:   Sharlot Gowda, M.D.  Rollene Rotunda, MD, [facility]  Genene Churn. Love, M.D.

## 2011-01-12 NOTE — Assessment & Plan Note (Signed)
[facility]                              CARDIOLOGY OFFICE NOTE   [name], [name]                      MRN:          [id number]  DATE:04/15/2006                            DOB:          1920-06-08    PRIMARY CARE PHYSICIAN:  Dr. Sharlot Gowda.   REASON FOR PRESENTATION:  Evaluate patient with non-Q-wave myocardial  infarction, status post CABG.   HISTORY OF PRESENT ILLNESS:  The patient presents for followup.  He was  admitted on March 27, 2006 with non-Q-wave myocardial infarction.  He  subsequently had a cardiac catheterization demonstrating left main stenosis.  He had a balloon pump placed.  He underwent CABG with LIMA to the LAD, SVG  to first diagonal, sequential SVG to ramus intermediate and obtuse marginal,  SVG to PDA.  He had postoperative atrial fibrillation.  He was somewhat slow  to recover, but was discharged home on April 04, 2006.  He has done  relatively well.  He has had some problems with low blood pressure.  Over  the phone, we stopped his beta blocker.  He has had some lower extremity  edema and was sent home with a week of diuretics.  He is keeping his feet up  some of the time, but not all of the time.   Overall, he thinks he is doing relatively well.  He has not had a vigorous  exercise tolerance even before this.  He has been weak.  He has been eating  well.  His blood pressure responded nicely to coming off the Lopressor,  though he has had continued lower extremity swelling.  He denies any of the  chest discomfort that was his previous angina.  He has had no new shortness  of breath and denies any PND or orthopnea.  He has had no palpitations,  presyncope or syncope.   PAST MEDICAL HISTORY:  1. Questionable gastroesophageal reflux disease.  2. Hypertension.  3. Coronary disease.   ALLERGIES:  None.   MEDICATIONS:  1. Amiodarone 200 mg b.i.d.  2. Lipitor 20 mg daily.  3. Proscar 5 mg daily.  4. Protonix 40  mg daily.  5. Aspirin 325 mg daily.   REVIEW OF SYSTEMS:  Review of systems is as stated in the HPI and otherwise  negative for other systems.   PHYSICAL EXAMINATION:  GENERAL:  The patient is in no distress.  VITAL SIGNS:  Blood pressure 132/68, heart rate 72 and regular, weight 171  pounds.  HEENT:  Eyelids unremarkable.  Pupils are equal, round and reactive to  light.  Fundi not visualized.  Oral mucosa unremarkable.  NECK:  No jugular venous distention.  Wave form within normal limits.  Carotid upstroke brisk and symmetric.  No bruits, no thyromegaly.  LYMPHATICS:  No cervical, axillary or inguinal adenopathy.  LUNGS:  Clear to auscultation bilaterally.  BACK:  No CVA tenderness.  CHEST:  Healing sternotomy without erythema or exudate.  HEART:  PMI not displaced or sustained, S1 and S2 within normal limits, no  S3, no S4, no lifts, no rubs, no murmurs.  ABDOMEN:  Flat.  Positive bowel sounds, normal in frequency and pitch.  No  bruits, rebound, or guarding.  No pulsatile mass, hepatomegaly,  splenomegaly.  SKIN:  No rashes.  No nodules.  EXTREMITIES:  Pulses 2+, upper, unable to appreciate dorsalis pedis and  posterior tibialis bilaterally.  Moderate-to-severe bilateral lower  extremity edema to the knees.  NEUROLOGIC:  Oriented to person, place and time.  Cranial nerves II-XII  grossly intact.   RADIOLOGIC FINDINGS:  Chest x-ray:  No acute airspace disease, status post  CABG.   ASSESSMENT AND PLAN:  1. The patient has done relatively well.  He does have some lower      extremity swelling and I am going to give him 40 mg of Lasix for 2      weeks and potassium.  We will check a BMET today.  He has a followup      soon with Dr. Dorris Fetch and he can get his legs looked at then as      well.  2. Coronary disease.  He will continue with secondary risk reduction.  No      further cardiovascular testing is suggested.  3. Postoperative atrial fibrillation.  I am going to reduce  his amiodarone      to 200 mg daily.  4. Followup:  I will see him back in about 6 weeks or sooner if needed.                               Rollene Rotunda, MD, [facility]    JH/MedQ  DD:  04/15/2006  DT:  04/16/2006  Job #:  [id number]   cc:   Sharlot Gowda, MD

## 2011-01-12 NOTE — Op Note (Signed)
NAMEKHALE, NIGH NO.:  [id number]   MEDICAL RECORD NO.:  [id number]          PATIENT TYPE:  INP   LOCATION:  2307                         FACILITY:  [facility]   PHYSICIAN:  Quita Skye. Krista Blue, M.D.  DATE OF BIRTH:  06-Jan-1920   DATE OF PROCEDURE:  03/28/2006  DATE OF DISCHARGE:                                 OPERATIVE REPORT   PROCEDURE:  Transesophageal echocardiogram.   HISTORY:  [name] is an 77 year old white male who presents to the  operating room for emergency coronary artery bypass grafting.  The patient  received surgery from Dr. Charlett Lango who requested transesophageal  echocardiogram for the patient's intraoperative management.   DESCRIPTION OF PROCEDURE:  The patient was brought into the operating room  under a cardiac induction where a transesophageal probe was inserted by Dr.  Arta Bruce.  The probe was covered in a latex free sheath, lubricated and  inserted into the patient's esophagus.  Initial overall images showed no  evidence of pericardial effusion or masses.  The right atrium had normal  size.  There was no evidence of intra-atrial septal defect.  The tricuspid  valve had 1+ regurgitation with the pulmonary artery catheter seen crossing  the valve.  The valve appeared to be normal with good valve contractility.  No evidence of segmental wall motion abnormalities and the pulmonary valve  was visualized and had 1+ regurgitation.  The left atrium was normal in size  without evidence of thrombus or masses in the atrium or appendage.  The  mitral valve had 1+ regurgitation with no evidence of flail or thickening of  the leaflets.  The valve seem to open well with no evidence of mitral  stenosis.  The left ventricle had some decrease in contractility  particularly in the septal region.  The anterior, inferior and lateral  valves had good motion.  The aortic valve had three leaflets.  There was 1+  central appearing aortic  insufficiency with a central appearing jet.  No  evidence of aortic stenosis.  The patient then underwent bypass grafting and  following separation from the bypass machine the patient's overall  contractility was much improved, but the mitral and the aortic valves  appeared to be unchanged.  The patient did well.  The transesophageal probe  was removed and the patient was taken to the SICU in stable/good condition.           ______________________________  Quita Skye Krista Blue, M.D.     JDS/MEDQ  D:  03/28/2006  T:  03/29/2006  Job:  [id number]

## 2011-01-12 NOTE — Assessment & Plan Note (Signed)
[facility]                                   ON-CALL NOTE   JOSUEL, KOEPPEN                      MRN:          [id number]  DATE:04/29/2006                            DOB:          Nov 06, 1919    Telephone call with Atha Starks.   I received a telephone call on 04/29/06 from Ms. Meckley regarding her husband  Kennieth.  I called her back at [phone].  She was concerned that Mr. Miskell'  blood pressure continued to run low.  Apparently the patient underwent  coronary artery bypass surgery in the early part of August and followed up  with Dr. Antoine Poche August 20.  Mrs. Dehart was calling because her husband's  blood pressure was continuing to run low. Mr. Esco had some postoperative  atrial fibrillation and was started on amiodarone.  Apparently when Dr.  Antoine Poche saw the patient on August 20, he was instructed to decrease his  amiodarone from 200 mg b.i.d. to amiodarone 200 mg daily.  When I spoke with  his wife she states that she does not recall this and she was still giving  the patient the amiodarone twice a day.  I asked her to not give the patient  the evening dose if she had not given it to him yet and to start on Sunday  morning with the 200 mg daily and then follow up with Dr. Antoine Poche or speak  with his nurse next week regarding the low blood pressure.  Apparently the  patient had had some problems with blood pressure during his hospitalization  and discontinued his beta-blocker previously.  He is not on any other  medications that might contribute to his low blood pressure and feelings of  weakness.  Mrs. Frappier verbalized understanding of instructions and agreed to  speak with Dr. Jenene Slicker nurse next week.                                   Dorian Pod, ACNP   MB/MedQ  DD:  05/05/2006  DT:  05/05/2006  Job #:  [id number]

## 2011-01-12 NOTE — Assessment & Plan Note (Signed)
[facility]                                 ON-CALL NOTE   [name], [name]                        MRN:          [id number]  DATE:11/02/2006                            DOB:          April 28, 1920    PRIMARY CARDIOLOGIST:  Dr. Antoine Poche.   I received a call this afternoon from Donald Prose regarding Mr.  [name], a patient of Dr. Antoine Poche.  He recently saw Dr. Antoine Poche  February 18.  At that visit, his blood pressure was running under 100  systolic and his Norvasc dose was decreased to 2.5 mg daily.  [name]  had some dizziness yesterday that was transient and has resolved.  Today, his wife noted that his blood pressure was a little on the low  side, initially 92/46 and then 88/38 on repeat, and now 104/45 with a  heart rate of 65.  During his lowest point, [name] complained of mild  blurring of the eyes, but otherwise has felt well and no longer has any  blurring.  [name] did receive his Norvasc this morning.  I advised  that, tomorrow morning, if his systolic pressure is less than 100, that  they should hold his Norvasc.  I note in Dr. Jenene Slicker note that he was  considering discontinuing it as the patient's blood pressure had been  falling over recent visits.  Finally, I said that if the patient were to  have any recurrence of dizziness, light-headedness, or blurring, that  they should have a low threshold for coming into the emergency room for  neurological evaluation as he has had a stroke relatively recently.  His  wife said that he feels fine now, and she did not think that would be  necessary at this point, but appreciated my advice.  She verbalized  understanding of when to hold the Norvasc, and will call back if there  are any additional problems.      Nicolasa Ducking, ANP  Electronically Signed      Rollene Rotunda, MD, [facility]  Electronically Signed   CB/MedQ  DD: 11/02/2006  DT: 11/02/2006  Job #: [id number]

## 2011-01-12 NOTE — Discharge Summary (Signed)
NAMEKORVER, GRAYBEAL               ACCOUNT NO.:  [id number]   MEDICAL RECORD NO.:  [id number]          PATIENT TYPE:  INP   LOCATION:  2036                         FACILITY:  [facility]   PHYSICIAN:  Salvatore Decent. Dorris Fetch, M.D.DATE OF BIRTH:  09/06/1919   DATE OF ADMISSION:  03/27/2006  DATE OF DISCHARGE:                                 DISCHARGE SUMMARY   PRIMARY DIAGNOSIS:  Left main and 3-vessel coronary artery disease with  ongoing unstable angina.   HOSPITAL DIAGNOSES:  1. Postoperative atrial fibrillation.  2. Acute renal insufficiency postoperatively.  3. Acute blood loss anemia postoperative.  4. Postoperative dysphagia.  5. Volume overload.   SECONDARY DIAGNOSES:  1. Gastroesophageal reflux disease.  2. Hypertension.  3. History of myocardial infarction.   ALLERGIES:  NO KNOWN DRUG ALLERGIES.   HOSPITAL OPERATIONS AND PROCEDURES:  1. Cardiac catheterization with a left heart catheterization, selective      coronary angiogram,  intra-aortic balloon pump insertion.  2. Emergent coronary artery bypass grafting times 5 using a left internal      mammary artery to left anterior descending, saphenous vein graft to the      first diagonal, sequential saphenous vein graft to ramus intermedius      and obtuse margin #2, saphenous vein graft to distal posterior      descending.  Endoscopic vein harvesting from both thighs done.  3. Intraoperative transesophageal echocardiogram.   HISTORY AND PHYSICAL AND HOSPITAL COURSE:  Mr. Schonberg is an 77 year old  gentleman who presented with a non-Q-wave myocardial infarction.  He  underwent cardiac catheterization during which cannulation of left main was  difficult.  The patient became diaphoretic and felt severe 10/10 chest pain  requiring intra-aortic balloon pump placement which decreased his pain but  did not totally eliminated.  Following catheterization, Dr. Dorris Fetch was  consulted for possible emergency coronary artery bypass  grafting.  After  evaluation, discussion with patient and family about undergoing coronary  artery bypass grafting, and discussed risks and benefits, it is felt that  this is the best option for patient.  Patient acknowledged understanding of  the risks and benefits and agreed to proceed.  Advised to take patient  emergently to the operating room.  For further history and physical exam  please see dictated history and physical.   HOSPITAL COURSE:  The patient was taken emergently to the operating room on  March 28, 2006 where he underwent emergent coronary artery bypass grafting  times 5 using a left internal mammary artery to left anterior descending,  saphenous vein graft to first diagonal, sequential saphenous vein graft to  ramus intermedius and obtuse marginal #2, saphenous vein graft to distal  posterior descending.  Endoscopic vein harvesting from both sides was done.  The patient tolerated this procedure well and transferred out to the  intensive care unit in stable condition.  The patient remained intubated  overnight.  He seem to be hemodynamically stable immediately  postoperatively.  Postop day #1 the patient was weaned to extubate.  Following extubation the patient was seen to be stable with sats greater  than 90  on nasal cannula.  Shortly following that intra-aortic balloon pump  was DC 'd.  The patient tolerated this well and was noted to have a warm  foot with good pulses distally following D&C of balloon pump.  No hematoma  noted at the groin site. following D&C.  During the patient's postoperative  course he developed postoperative atrial fibrillation postop day #2.  At  that time amiodarone IV was started.  He was also started on beta blocker.  The patient did convert to normal sinus rhythm following initiation of IV  amiodarone.  He remained in normal sinus rhythm and amiodarone was switched  to p.o.  He was continued on the amiodarone and beta blocker during the   remainder of his hospital course.  He is seen to remain in normal sinus  rhythm the remainder of his hospital course.  The patient also is noted to  have developed acute renal insufficiency postoperatively.  On postop day #2  his creatinine was noted to have increased to 1.9.  This was not  _____________ over the next several days.  It started to trend down and by  postop day #7 was  at 1.6.  The patient's chest tubes and lines are DC 'd in  the normal fashion.  He did require a longer ICU course due to complaints of  dysphagia, as well as atrial fibrillation.  The patient had complained of  dysphagia to the on postop day #3.  At that time.  Speech Pathology was  consulted.  A bed swallow evaluation was done on postop day #3.  This showed  clinical signs of oropharyngeal dysphagia.  At that time the patient was  continued through NPO and an MBS was ordered.  The  modified barium swallow  study was done on postop day #4.  This showed relatively normal  oropharyngeal swallow function.  Speech pathologist noted rather large  osteophytes at C4-C6 level impinging on the esophagus causing mild residual  on the UVS with occasional spilling.  However patient was able to clear his  throat and prevent aspiration.  At that time the patient was started back on  regular diet.  He did well with regular diet without any difficulty.  Speech  Pathology educate them well on inspiration percussions.  The patient has no  history of diabetes mellitus that was noted postoperatively.  The patient  had hyperglycemia with blood sugars greater than 200.  At that time the  patient was started on sliding scale insulin.  Blood sugars were continued  to be monitored during his postoperative course.  No oral diabetic  medications added at this time.  The patient's blood sugars started to  stabilize.  Diabetic educator was consulted for evaluation.  They educated the patient on diet and use of Glucometer.  Family was in  room with the  patient and acknowledged their understanding.  The patient was started on  carbohydrate modified medium calorie diet.  On postop day #3 the patient's  hemoglobin and hematocrit were noted to have dropped to 7.6 and 21.7.  The  patient was stable.  It was felt that transfusion was required.  The patient  receive a unit of packed red blood cells.  Hemoglobin and hematocrit were  monitored and increased appropriately the next day to 10.5 and 30.6.  He  continued to be followed on postop day #7 remained stable at 10.6 and 31.2.   The patient's incisions were clean, dry and intact and healing well.  He  is  seen to be afebrile during his postoperative course.  Vital signs are  monitored and blood pressure was stable.  The patient is able to be weaned  off oxygen, sating greater than 90% on room air.  He did develop volume  overload and was started on low dose diuretics.  Creatinine was monitored  during the initiation of diuretics.  On postop day #7 the patient remained  12 pounds above his preoperative weight at 179 pounds, where his  preoperative weight was 167 pounds.  The patient was tolerating regular diet  well and no nausea, vomiting noted.  He was out of bed ambulating well with  assistance and walker.   The patient is tentatively ready for discharge home postop day 8, April 05, 2006.   FOLLOW-UP APPOINTMENTS:  A follow-up appointment has been scheduled with Dr.  Dorris Fetch for April 22, 2006 at 1:30 p.m.  The patient is to follow up  Dr. Eden Emms in 2 weeks.  He is to call Dr. Fabio Bering office, schedule follow-  up appointment.  The patient will obtain a PMI and chest x-ray at this  appointment which he will then bring with him to Dr. Sunday Corn office.  The patient will also need to follow up with his primary care physician in 2-  3 weeks for follow-up of his blood sugars.  The patient was given  prescription for a home blood glucose meter and was told to record  his blood  sugars and present them to his primary physician at the office visit.   ACTIVITY:  Patient was told no driving till released to do so, no lifting  over 10 pounds.  The patient is told he is safe to ambulate 3-4  times per  day, progress as tolerated and to continue his breathing exercises.   INCISIONAL CARE:  The patient's is told to wash his incisions using soap and  water.  He is contact the office if he develops any drainage or opening from  any of his incision sites.  He is also to contact the office if he develops  a temperature of 101.0 degrees Fahrenheit or greater.   DIET:  The patient was educated on a diet to be low-fat, low-salt as well as  carbohydrate modified medium calorie diet.   HOME HEALTH:  Physical therapy has been arranged as well as a walker for use  at home.   DISCHARGE MEDICATIONS:  1. Aspirin 325 mg daily.  2. Lopressor 25 mg b.i.d.  3. Lipitor 20 mg q.h.s. 4. Amiodarone 400 mg b.i.d. x3 days then 200 mg b.i.d..  5. Proscar 5 mg daily.  6. Protonix 40 mg daily.  7. Multivitamin daily.  8. Ensure over-the-counter b.i.d..  9. Lasix 40 mg daily x7 days.  10.Potassium chloride 20 mEq daily x7 days.  11.Oxycodone 5 mg 1-2 tabs q. 4-6 hours p.r.n. pain.      Theda Belfast, PA    ______________________________  Salvatore Decent Dorris Fetch, M.D.    KMD/MEDQ  D:  04/04/2006  T:  04/04/2006  Job:  [id number]   cc:   Salvatore Decent. Dorris Fetch, M.D.  Charlton Haws, M.D.

## 2011-01-12 NOTE — Op Note (Signed)
NAME:  [name], [name]                         ACCOUNT NO.:  [id number]   MEDICAL RECORD NO.:  [id number]                   PATIENT TYPE:  AMB   LOCATION:  DAY                                  FACILITY:  [facility]   PHYSICIAN:  Anselm Pancoast. Zachery Dakins, M.D.          DATE OF BIRTH:  October 04, 1919   DATE OF PROCEDURE:  09/16/2002  DATE OF DISCHARGE:                                 OPERATIVE REPORT   PREOPERATIVE DIAGNOSES:  1. Chronic cholelithiasis.  2. Chronic cholecystitis.   POSTOPERATIVE DIAGNOSES:  1. Chronic cholelithiasis.  2. Chronic cholecystitis.   OPERATION:  Laparoscopic cholecystectomy with cholangiogram.   SURGEON:  Anselm Pancoast. Zachery Dakins, M.D.   ASSISTANT:  Currie Paris, M.D.   ANESTHESIA:  General.   HISTORY:  The patient is an 77 year old Caucasian male who I have previously  done hernia repairs on, who was referred to me by Dr. Aleen Campi for  epigastric pain after he had a severe episode of pain approximately a week  ago. He has a history of paroxysmal atrial tachycardia and is on Atenolol  but the pain was kind of epigastric and then radiated to the back and then  kind of crossed his back to the subbreast area as if he possibly had passed  a common duct stone. We did an ultrasound that showed gallstones and a  nondilated common bile duct. No laboratory studies were performed. The pain  lasted about two hours and he was then referred to my office.   He gets annual physical exams by Dr. Aleen Campi and associates and they  thought that he was in stable condition to proceed with a laparoscopic  cholecystectomy and cholangiogram. The patient had laboratory studies  preoperatively and both CBC and CMET were normal.   PREMEDICATION:  The patient, preoperatively, was given 3 gm of Unasyn, had  PAS stockings and was taken to the operative suite.   DESCRIPTION OF PROCEDURE:  After induction of general anesthesia,  endotracheal tube, oral tube into the stomach, a small  incision was made  below the umbilicus after being prepped in a routine manner. The fascia was  identified, picked up between two Kochers and a small opening made into the  peritoneal cavity. A pursestring suture of #0 Vicryl was placed, a Hasson  cannula introduced, and the camera inserted.   The gallbladder was chronically thickened and dilated but fortunately not  acutely inflamed. The liver looked unremarkable. The upper 10 mm trocar was  placed in the subxiphoid area and two lateral 5 mm trocars were placed  laterally at the anterior axillary line. The gallbladder was retracted  upward and outward and then we  dissected proximally and came down on a  little short cystic duct. This was identified at its junction with the  gallbladder and clipped flush with the gallbladder.  Then a small opening  made and a Cook catheter introduced.  X-ray was obtained that showed good  prompt filling of the extrahepatic biliary system, good flow into the  duodenum.   The catheter was removed, the cystic duct was doubly clipped and then  divided. The cystic artery was then identified and this was proximally  clipped a similar distance and then divided distal to the two proximal clips  and then the gallbladder freed from its bed using the hook electrocautery.  There was a little spillage of bile and this was irrigated and aspirated at  completion and the gallbladder placed in the EndoCatch bag. The gallbladder  bed was carefully inspected.  Good hemostasis obtained with the cautery and  then we thoroughly irrigated until there was no longer any bile staining in  the irrigating fluid. We reinspected the liver bed, good hemostasis, no  drainage of bile.   Then the gallbladder was removed from the umbilical fascial defect within  the EndoCatch bag. There were numerous multiple small stones. The lateral 5  mm ports were not bleeding and were withdrawn. The irrigating fluid had been  aspirated. I did  place a second figure-of-eight in the fascia at the  umbilicus.  These were all tied and then after the carbon dioxide released  and the upper 10 mm port withdrawn, I placed a figure-of-eight in the  anterior fascia of the subxiphoid area such that I could see into the  peritoneal cavity. The subcutaneous wounds were closed with  4-0 Vicryl,  Benzoin and Steri-Strips on the skin.   The patient tolerated the procedure nicely. Hopefully, he will not have any  problems postoperatively and will be released in the morning. He will  continue on his medications for his PAT but he had no evidence of any  tachycardia intraoperatively.                                               Anselm Pancoast. Zachery Dakins, M.D.    WJW/MEDQ  D:  09/16/2002  T:  09/16/2002  Job:  [id number]   cc:   Aram Candela. Aleen Campi, M.D.  [address]  [place]  [place] [address]  Fax: [phone]

## 2011-01-12 NOTE — Assessment & Plan Note (Signed)
[facility]                              CARDIOLOGY OFFICE NOTE   CHRYSTOPHER, STANGL                      MRN:          [id number]  DATE:05/13/2006                            DOB:          12-Sep-1919    PRIMARY CARE PHYSICIAN:  Dr. Sharlot Gowda.   REASON FOR PRESENTATION:  Evaluate patient with lower extremity leg  swelling, status post bypass graft.   HISTORY OF PRESENT ILLNESS:  The patient was added on to my schedule today  because of complaints of increased lower extremity swelling and ecchymosis.  His wife noticed an area in his right upper thigh, his saphenous vein graft  endoscopic harvest site that had become ecchymotic.  It might be slightly  more  indurated and firm.  There has been no warmth or drainage.  He has had  no fevers.  He has had no leg pain.  He has had continued swelling in his  ankles that has been about the same.  She has noticed that his feet have  been blue when dangling.  He is doing a little bit of walking in the house  but he has not yet started cardiac rehab.  He is not getting any chest  discomforts which he has had prior to CABG.  He is not having any shortness  of breath, PND or orthopnea.  He is having some incisional chest discomfort  that is much improved.  He has had no palpitation, presyncope, or syncope.  Of note, we have been following his creatinine which has been slightly  elevated.  We have been holding diuretics because of this.  This has been  stable.   PAST MEDICAL HISTORY:  Coronary artery disease status post non-Q wave  myocardial infarction, CABG (LIMA to LAD, SVG to first diagonal, sequential  SVG to ramus intermedius and obtuse marginal, SVG to PDA), post operative  fibrillation, gastroesophageal reflux disease, hypertension, renal  insufficiency.   ALLERGIES:  None.   CURRENT MEDICATIONS:  1. Amiodarone 10 mg q. day.  2. Multivitamin.  3. Aspirin 325 mg q. day.  4. Protonix 40 mg q.  day.  5. Proscar 5 mg q. day.  6. Lipitor 20 mg q. day.   REVIEW OF SYSTEMS:  As stated in the HPI.  Otherwise negative for other  systems.   PHYSICAL EXAMINATION:  The patient is in no distress.  Blood pressure  146/68, heart rate 73 and regular, weight 166 pounds.  HEENT:  Eyes unremarkable.  Pupils equal, round, reactive to light, fundi  not visualized.  Oral mucosa unremarkable.  NECK:  No jugular venous distention at 45 degrees, carotid upstroke brisk  and symmetric.  No bruits, thyromegaly.  LYMPHATICS:  No adenopathy.  LUNGS:  Clear to auscultation bilaterally.  BACK:  No costovertebral angle tenderness.  CHEST:  Well healed sternotomy scar.  HEART:  PMI not displaced or sustained.  S1 and S2 within normal limits, no  S3, no S4, no rub, no murmurs.  ABDOMEN:  Flat, positive bowel sounds, normal frequency and pitch.  No  bruits, no rebound, no guarding, no  midline pulsatile mass.  No hepatomegaly  or splenomegaly.  SKIN:  No rashes.  EXTREMITIES:  2+ upper pulses, 1+ dorsalis pedis and posterior tibialis  bilaterally, moderate bilateral lower extremity edema.  Bilateral saphenus  vein graft harvest site, there is mild firm swelling in the right upper  thigh, the harvest site, this seems to be slightly more pronounced from  previous.  It is firm and nontender.  There is ecchymosis which the patient  describes as newly evolved, there is no erythema or drainage, there is  similar swelling on the left side, though much less pronounced.  NEUROLOGICAL:  Oriented to person, place and time.  Cranial nerves II  through XII grossly intact, motor grossly intact throughout.   EKG:  Sinus rhythm, rate 73, axis within normal limits.  Intervals within  normal limits, infra-lateral T-wave inversions representing most likely  chronic changes, I do not have the old EKG for comparison.   ASSESSMENT/PLAN:  1. Lower extremity swelling:  This is the reason the patient presents.  He      is  going to keep his feet elevated.  I am going to withhold the      diuretics because of his renal insufficiency.  I do have him scheduled      to see the cardiovascular surgeons tomorrow to re-evaluate his leg,      although I do not think there is any acute process and that we can      probably follow this.  2. Coronary disease:  He is going to start rehab as soon as he comes back      and sees me in 2 weeks and is cleared by the cardiovascular surgeons as      well.  He will continue with aggressive risk reduction.  3. Followup:  I will see him back in 2 weeks as was previously planned.      At that point he will get a BMET as well.                               Rollene Rotunda, MD, [facility]    JH/MedQ  DD:  05/13/2006  DT:  05/14/2006  Job #:  [id number]   cc:   Sharlot Gowda, M.D.

## 2011-01-12 NOTE — H&P (Signed)
NAME:  [name], [name]               ACCOUNT NO.:  [id number]   MEDICAL RECORD NO.:  [id number]          PATIENT TYPE:  INP   LOCATION:  2009                         FACILITY:  [facility]   PHYSICIAN:  Garrison Columbus. Haithcock, MDDATE OF BIRTH:  February 09, 1920   DATE OF ADMISSION:  03/27/2006  DATE OF DISCHARGE:                              HISTORY & PHYSICAL   PRIMARY CARE PHYSICIAN:  Dr. Sharlot Gowda.   CHIEF COMPLAINT:  Indigestion.   HISTORY OF PRESENT ILLNESS:  [name] is a very pleasant 87-year-  old man with a history of recently diagnosed GERD, who presents with  complaints of indigestion; it is more severe tonight, but has been  ongoing for the last year.  He has been seen by Gastroenterology and  diagnosed with GERD, on proton pump inhibitors with little effect on his  symptoms.  His symptoms occur at rest in the middle of the night and  seem to wake him for sleeping.  Recently, his beta blockers and ACE  inhibitors were stopped suddenly due to hypotension.   ALLERGIES:  No known drug allergies.   MEDICATIONS:  Atenolol and enalapril on hold, Protonix.   PAST MEDICAL HISTORY:  1. GERD.  2. Hypertension.  3. Per patient, he had a heart attack at age 74.   SOCIAL HISTORY:  He lives in [place] with his wife.  Occupation:  He  is retired.   FAMILY HISTORY:  Mother died of complications of cancer; father died  complication of a stroke.   REVIEW OF SYSTEMS:  Otherwise negative.   PHYSICAL EXAMINATION:  VITAL SIGNS:  Temperature is 98.7, blood pressure  is 186/83, pulse is 115, respiratory rate is 18.  GENERAL:  He is a pleasant man in no apparent distress, somewhat pale-  appearing.  NECK:  Clear.  CARDIOVASCULAR:  Pulses are 2+ throughout.  Regular, S1 and S2, with no  murmurs, rubs, or gallops.  ABDOMEN:  Soft and nontender with positive bowel sounds.  EXTREMITIES:  No clubbing, cyanosis or edema.  NEUROLOGIC:  Cranial nerves II-XII are intact.   LABORATORY AND  ACCESSORY CLINICAL DATA:  EKG:  Sinus tachycardia at 110,  ST depressions in II, III, aVF and I and aVL.   White count 15, hematocrit 36, platelets 192,000.  Sodium 136, potassium  5, chloride 105, BUN 30, creatinine 1.6.  Troponin point of care 0.28.   ASSESSMENT AND PLAN:  This is an 77 year old man with indigestion and  positive point-of-care enzymes.  We will consider for acute coronary  syndrome.  This could have been triggered by acute beta blocker  withdrawal, as the patient is both hypertensive and tachycardic.  We  will hold a IIb/IIIa inhibitor because of his age, continue beta  blockers, nitroglycerin, aspirin and heparin.  ACE inhibitor once BUN  and creatinine are available.      Daniel B. Haithcock, MD  Electronically Signed     Garrison Columbus. Haithcock, MD  Electronically Signed    DBH/MEDQ  D:  03/28/2006  T:  03/28/2006  Job:  [id number]

## 2011-01-12 NOTE — H&P (Signed)
NAME:  [name], [name]               ACCOUNT NO.:  [id number]   MEDICAL RECORD NO.:  [id number]          PATIENT TYPE:  INP   LOCATION:  1826                         FACILITY:  [facility]   PHYSICIAN:  Mobolaji B. Bakare, M.D.DATE OF BIRTH:  01-20-1920   DATE OF ADMISSION:  10/08/2006  DATE OF DISCHARGE:                              HISTORY & PHYSICAL   PRIMARY CARE PHYSICIAN:  Sharlot Gowda, M.D.   NEUROLOGIST:  Genene Churn. Love, M.D.   CARDIOLOGIST:  Rollene Rotunda, MD, [facility].   CHIEF COMPLAINT:  Numbness right upper and lower extremities.   HISTORY OF PRESENTING COMPLAINT:  [name] is a pleasant 77 year old  Caucasian male who was recently hospitalized on October 03, 2006 and  discharged on October 06, 2006 after being extensively worked up for  left frontal stroke evident on MRI of the brain.  He had no focal  neurological witnessed.  He presented at that time with numbness right  upper and lower extremities with tingling.   He woke up about 2 a.m. today with feeling of numbness in his forearm  and volar surface of the hand, also numbness over his shoulder.  There  is no neck pain.  He also noticed numbness involving the inner aspect of  right thigh.  The patient has baseline peripheral neuropathy involving  the lower third of his leg and feet.  He did not feel weak on any side  of his body.  There were no headaches, diplopia, change in his vision,  or slurred speech.  The patient waited for about 30 minutes to see if  this was going to go away.  He decided to call his daughter, and he was  brought to the emergency room for further evaluation.  Since he has been  here, the numbness has since improved and almost gone in his right  forearm, but still has some residual feeling on his right inner thigh.   Please see discharge summary dictated by Dr. Jetty Duhamel for details  of extensive workup.   REVIEW OF SYSTEMS:  No fever, chills, or headaches.  No nausea,  vomiting, or  abdominal pain.  No difficulty with micturition.   PAST MEDICAL HISTORY:  1. Most recently left frontal lobe stroke.  2. Coronary artery disease, status post bypass surgery in August 2007.  3. Hyperlipidemia.  4. Hypertension.  5. Transient atrial fibrillation postoperatively in August 2007.  6. Chronic renal insufficiency.  7. Normocytic anemia.  8. Impaired glucose tolerance.  9. Peripheral neuropathy.   MEDICATIONS:  1. Plavix 75 mg daily.  2. Aspirin 325 mg daily.  3. Proscar 5 mg daily.  4. Norvasc 5 mg daily.  5. Lipitor 20 mg daily.   FAMILY HISTORY:  Noncontributory.   SOCIAL HISTORY:  The patient lives with his wife.  He is fairly active  at home.  He does not smoke cigarettes or drink alcohol.   PHYSICAL EXAMINATION:  INITIAL VITAL SIGNS:  Temperature 97, blood  pressure 126/64, pulse 91, respiratory rate 20, O2 saturations 99%.  GENERAL:  The patient is awake, alert, and oriented in time, place, and  person.  HEENT:  Normocephalic, atraumatic head.  Pupils equal and reactive to  light.  Extraocular muscle movements intact.  NECK:  No carotid bruits.  No elevated JVD.  LUNGS:  Clear clinically to auscultation.  CVS:  S1, S2.  Regular.  No murmur.  ABDOMEN:  Nondistended, soft, nontender.  Bowel sounds present.  EXTREMITIES:  No pedal edema of calf tenderness.  Dorsalis pedis pulses  palpable 2+ bilaterally.  No cyanosis.  CNS:  Cranial nerves II-X are intact.  No facial asymmetry.  Muscle  power 5/5 in all limbs.  Tendon reflexes:  Trace both ankles, 2+ both  knees.  Biceps and triceps were symmetrical.  Plantar reflex was  downgoing bilaterally.  Sensation intact.  However, he felt a difference  in sensation on right L3 dermatome.  SKIN:  No rash, no petechiae.   INITIAL LABORATORY DATA:  Cardiac markers normal.  Hemoglobin 11.9,  hematocrit 35.  Sodium 140, potassium 4.3, chloride 107, glucose 101,  BUN 31, bicarbonate 27.  White cells 9, platelets 156,  normal  differential.  TSH drawn on October 05, 2006 was 28.34.  Hemoglobin A1c  was 6.5.  Fasting lipid profile on October 05, 2006 was within normal  limits, with LDL of 42 and HDL of 40, total cholesterol of 94.  Homocysteine level was 13.1.   OTHER PERTINENT WORKUP DONE PREVIOUSLY:  Carotid Dopplers, which showed  40%-60% internal carotid artery stenosis on the left and 40%-60% versus  60%-80% stenosis on the right internal carotid artery, with flow being  antegrade.  A 2D echocardiogram showed ejection fraction of 45%-50%,  with hypokinesis of the inferior posterior wall, left atrial dilatation,  and possible mild pulmonic insufficiency.  EKG showed normal sinus  rhythm with PVC, mild QTc prolongation, 462.  Head CT scan done today  showed no new abnormalities since October 03, 2006, with small focus of  encephalomalacia left frontal lobe and ventricular dilatation,  unchanged.   ASSESSMENT AND PLAN:  1. [name] is an 77 year old Caucasian male who was recently      hospitalized for left frontal lobe stroke on October 03, 2006.  He      was discharged home on aspirin and Plavix.  He is now presenting      with similar symptoms of numbness which is now resolving.  Numbness      is involving the right upper and lower extremities.  The patient's      case was discussed with Dr. Sandria Manly by ER physician.  Recommendation      was to repeat MRI of the brain.  The patient will be continued on      aspirin and Plavix until reviewed by Dr. Sandria Manly.  Possible      consideration for Aggrenox.  Further investigation will depend on MRI results.  The patient will be  admitted to telemetry.  1. Hypothyroidism, with markedly elevated TSH.  Will repeat TSH and      free T4.  Will start the patient on Synthroid 100 mcg daily.  2. Hyperlipidemia.  3. Hypertension.  4. Impaired glucose tolerance. 5. Coronary artery disease, status post bypass surgery in August 2007.  6. History of transient  postoperative atrial fibrillation.      Mobolaji B. Corky Downs, M.D.  Electronically Signed     MBB/MEDQ  D:  10/08/2006  T:  10/08/2006  Job:  [id number]   cc:   Sharlot Gowda, M.D.  Genene Churn. Love, M.D.  Rollene Rotunda, MD, [facility]

## 2011-01-12 NOTE — Assessment & Plan Note (Signed)
[facility]                            CARDIOLOGY OFFICE NOTE   RAYMUNDO, ROUT                      MRN:          [id number]  DATE:12/12/2006                            DOB:          [birth year]    REASON FOR PRESENTATION:  Evaluate the patient with coronary artery  disease.   HISTORY OF PRESENT ILLNESS:  The patient is a very pleasant 77 year old  gentleman with coronary disease as described. Since I last saw him, he  did have an episode of blurred vision. This happened apparently  yesterday. He said his left eye had some partial loss of vision for  about 30 minutes. He had no loss of speech or motor with this. He has  also had some episodes of lightheadedness. He talked with our P.A. about  this. It was suggested that he might want to discontinue his Norvasc,  but he did not do this.   He has also had some pain in his back that was his previous chest pain  and for a long while was felt to be reflux. It was never clear whether  this could have been his angina. He had not had it since his bypass  surgery. He has had this happen only after he ate food and went outside  to do something. It happened twice. He has not had this in about a week  and a half. He denies any shortness of breath. He has had no neck  discomfort or arm discomfort. He has had no palpitations, pre-syncope or  syncope. He is not exercising since he finished cardiac rehab.   PAST MEDICAL HISTORY:  1. Coronary artery disease, status post non Q-wave myocardial      infarction, coronary artery bypass graft (Left internal mammary      artery (LIMA) to the left anterior descending artery (LAD);      saphenous vein graft (SVG) to first diagonal; sequential saphenous      vein graft (SVG) to ramus intermediate and obtuse marginal;      saphenous vein graft (SVG) to the posterior descending artery      (PDA)).  2. Postoperative atrial fibrillation.  3. Gastroesophageal reflux  disease.  4. Hypertension.  5. Renal insufficiency.   CURRENT MEDICATIONS:  1. Lipitor 20 mg daily.  2. Proscar 5 mg daily.  3. Aggrenox b.i.d.  4. Tylenol.  5. Iron 50 mg b.i.d.  6. Synthroid 125 mcg daily.  7. Norvasc 2.5 mg daily.   REVIEW OF SYSTEMS:  As stated in the HPI and otherwise negative for  other systems.   PHYSICAL EXAMINATION:  The patient is in no distress. Blood pressure  100/52, heart rate 74 and regular, weight 167 pounds. Body mass index of  22.  HEENT: Eyes unremarkable. Pupils equal, round, and reactive to light.  Fundi not visualized. Oral mucosa is unremarkable.  NECK: No jugular venous distention at 45 degrees. Carotid upstroke brisk  and symmetrical. No bruits, no thyromegaly.  LYMPHATICS: No adenopathy.  LUNGS: Clear to auscultation bilaterally.  BACK: No costovertebral angle tenderness.  CHEST: Unremarkable, except for well-healed sternotomy scar.  HEART: PMI not displaced or sustained. S1, S2 within normal limits. No  S3. No S4. No clicks, rubs, murmurs.  ABDOMEN: Flat, positive bowel sounds, normal in frequency and pitch. No  bruits. No rebounds. No guarding. No midline pulsatile mass. No  organomegaly.  SKIN: No rashes, no nodules.  EXTREMITIES: 2+ upper pulses, 1+ dorsalis pedis and posterior tibialis  bilaterally. Mild bilateral ankle edema, bilateral saphenous vein graft  harvest sites unremarkable.  NEURO: Grossly intact.   ASSESSMENT/PLAN:  1. Chest discomfort. The patient is having some chest and back      discomfort. It is not clear that this is reflux versus angina. At      this point, I am going to restart his Protonix. He also going to be      given sublingual nitroglycerine and was given instructions on how      to use this. He will let me know if his symptoms recur or change.  2. Visual disturbance. I am concerned about this and asked him to call      Dr. Pearlean Brownie to see if any further evaluation needs to be undertaken.  3.  Hypertension. The patient's blood pressure continues to be low. I      am going to stop his Norvasc altogether.  4. Coronary disease. He will continue with secondary risk reduction.      He will remain on the Lipitor and have this followed by his primary      care physician.  5. Mildly reduced ejection fraction. At this point, he does not have      blood pressure for medication titration for management of this. He      is having Class I symptoms.  6. Followup. I have encouraged him to participate in the maintenance      phase of cardiac rehab. I will see him back in a couple of months      or sooner if he has any increasing symptoms.     Rollene Rotunda, MD, [facility]  Electronically Signed    JH/MedQ  DD: 12/12/2006  DT: 12/12/2006  Job #: [id number]   cc:   Sharlot Gowda, M.D.

## 2011-01-12 NOTE — H&P (Signed)
NAME:  [name], [name]               ACCOUNT NO.:  [id number]   MEDICAL RECORD NO.:  [id number]          PATIENT TYPE:  INP   LOCATION:  5730                         FACILITY:  [facility]   PHYSICIAN:  Theresia Bough, MD       DATE OF BIRTH:  Jan 02, 1920   DATE OF ADMISSION:  10/03/2006  DATE OF DISCHARGE:                              HISTORY & PHYSICAL   CHIEF COMPLAINT:  Tingling and numbness of the right arm and the right  leg.   HISTORY OF PRESENT ILLNESS:  This is an 77 year old white male patient  who came into hospital today.  He had tingling and numbness of the right  arm this morning.  The tingling also extended to the forearm on the  right and also numbness on the right foot.  Patient denies any fall.  He  woke up this morning and shortly after that he was having these  symptoms.  He denies any nausea.  No vomiting.  No diarrhea.  Patient  said he felt some mild pain on the right side of his forehead this  morning when he went for MRI.  No nausea.  No vomiting.  No diarrhea.  No chest pain.  No shortness of breath.  No diaphoresis.  No abdominal  pain.  He denies any dysuria.  No leg swelling.  No feet edema.   PAST MEDICAL HISTORY:  Includes:  1. Coronary artery disease.  2. Coronary artery bypass graft.  3. History of gastroesophageal reflux disease.   PAST SURGICAL HISTORY:  CABG.   HOME MEDICATIONS:  Include:  1. Proscar 5 mg daily.  2. Aspirin 325 mg daily.  3. Norvasc 5 mg daily.  4. Lipitor 20 mg daily.   SOCIAL HISTORY:  He lives with his spouse.  No smoking.  No alcohol.  No  drug use.   FAMILY HISTORY:  Noncontributory.   HE HAS NO KNOWN DRUG ALLERGIES.   PHYSICAL EXAMINATION:  VITAL SIGNS:  Shows a temperature of 97.0.  Blood  pressure of 120/57.  Pulse rate 69.  Respirations 17.  Oxygen saturation  98% on room air.  HEENT:  Shows pink conjunctivae.  He has no jaundice.  Mucous membranes  moist.  NECK:  Supple.  CHEST:  Clear to auscultation  bilaterally.  HEART:  Shows first and second heart sounds.  No murmur and no gallop.  ABDOMEN:  Soft, nontender.  No masses palpable.  Patient has normal  bowel sounds.  EXTREMITIES:  Shows no edema.  CENTRAL NERVOUS SYSTEM:  Patient is alert and oriented.  Power is 4/4 in  all limbs.  His speech is clear.  No facial asymmetry.   Initial blood work shows a sodium of 140, potassium 4.4, chloride of  108, bicarb of 34, glucose of 124, pH of 7.36, PCO2 of 49.  Hematocrit  41%, hemoglobin 13.9 grams/dL.  Creatinine 1.4, WBC 9.9, platelets of  157.  CK-MB less than 1.0, troponin-I less than 0.05.  INR 1.1, PT 14.2  and PTT 33 seconds.   EKG shows normal sinus rhythm with frequent PVC.  MRI of the  head shows  enlarged ventricles.  This is query normal pressure  hydrocephalus/atrophy.  Small acute infarct left frontal lobe.  MRA also  shows severe stenosis distal right vertebrae.   ASSESSMENT:  1. Mild cerebrovascular accident.  2. History of coronary artery disease, status post coronary artery      bypass graft.  3. History of high blood pressure.   PLAN:  Admit patient to neurology floor.  I will continue patient's home  medications including ASA.  I will also start him on Plavix 75 mg p.o.  daily, first dose now.  I have also called neurologist to see this  patient.      Theresia Bough, MD  Electronically Signed     GA/MEDQ  D:  10/03/2006  T:  10/04/2006  Job:  [id number]

## 2011-01-12 NOTE — Op Note (Signed)
NAME:  [name], [name]               ACCOUNT NO.:  [id number]   MEDICAL RECORD NO.:  [id number]          PATIENT TYPE:  INP   LOCATION:  2399                         FACILITY:  [facility]   PHYSICIAN:  Salvatore Decent. Dorris Fetch, M.D.DATE OF BIRTH:  1919-12-01   DATE OF PROCEDURE:  03/28/2006  DATE OF DISCHARGE:                                 OPERATIVE REPORT   PREOPERATIVE DIAGNOSIS:  Left main and three-vessel disease with ongoing  unstable angina.   POSTOPERATIVE DIAGNOSIS:  Left main and three-vessel disease with ongoing  unstable angina.   PROCEDURE:  1.  Median sternotomy.  2.  Extracorporeal circulation.  3.  Coronary artery bypass graft x5 (left internal mammary artery to left      anterior descending, saphenous vein graft to first diagonal, sequential      saphenous vein graft to ramus intermedius and obtuse marginal #2,      saphenous vein graft distal posterior descending).  4.  Endoscopic vein harvest, both thighs.   SURGEON:  Salvatore Decent. Dorris Fetch, M.D.   ASSISTANT:  Theda Belfast, P.A.   ANESTHESIA:  General.   FINDINGS:  LAD -- fair quality.  Remaining targets -- very poor quality.  Saphenous vein -- fair quality.  Mammary -- good quality Transesophageal  echocardiography revealed preserved wall motion with mild mitral  regurgitation.   CLINICAL NOTE:  Mr. Siegert is an 77 year old gentleman who presented with a  non-Q-wave myocardial infarction.  He underwent cardiac catheterization,  during which cannulation of the left main was difficult.  The patient became  diaphoretic and developed severe 10/10 chest pain requiring intra-aortic  balloon pump placement, which decreased, but did not totally eliminate his  pain.  I was consulted for emergency bypass grafting due to the hazy lesion  in the left main coronary artery and ongoing ischemia.  I discussed with the  patient the indications, risks, benefits, and alternatives.  He understood  and accepted the risks and  agreed to proceed.  I also met with the patient's  family and informed them of the issues as well.   OPERATIVE NOTE:  Mr. Navarrete was brought emergently from the cath lab to the  operating room.  There, the anesthesia service induced general anesthesia  and placed lines for monitoring arterial, central venous and pulmonary  arterial pressure and performed transesophageal echocardiography, which  revealed good left ventricular function.  There was mild mitral  regurgitation and trace aortic insufficiency.  The chest, abdomen and legs  were prepped and draped in the usual fashion.  Incision was made in the  medial aspect of the left leg at the level of knee.  The greater saphenous  vein was identified and harvested endoscopically from the left thigh.  The  vein was very superficial below the knee and attempts to harvest  endoscopically were unsuccessful.  Several bridged incisions were made in  the leg; however, this portion of the vein was sclerotic and was not  adequate for use as a bypass graft.  Therefore, additional vein was  harvested endoscopically from the right thigh.   Simultaneously, a median sternotomy was  performed and the left internal  mammary artery was harvested in a standard fashion.  The mammary artery was  of a good-quality vessel.  The patient was heparinized.  The pericardium was  opened.  The ascending aorta was inspected and there was no evidence of  atherosclerotic disease.  The aorta was cannulated via concentric 2-0  Ethibond pledgeted pursestring sutures.  A dual-stage venous cannula was  placed via pursestring suture in the right atrial appendage.  After  confirming adequate anticoagulation with ACT measurement, the patient was  placed on cardiopulmonary bypass and cooled to 32 degrees Celsius.  Flows  were maintained per protocol throughout the bypass period.  The coronary  arteries were inspected and anastomotic sites were chosen.  The conduits was  inspected  and cut to length.  A foam pad was placed in the pericardium to  protect the left phrenic nerve.  A temperature probe was placed in the  myocardial septum.  A retrograde cardioplegia cannula was placed via a  pursestring suture in the right atrium and directed into the coronary sinus  and an antegrade cardioplegic cannula placed in the ascending aorta.   The aorta was crossclamped.  The left ventricle was emptied via the aortic  root vent.  Cardiac arrest then was achieved with a combination of cold  antegrade and retrograde blood cardioplegia and topical iced saline.  After  achieving a complete diastolic arrest and adequate myocardial septal  cooling, the following distal anastomoses were performed.   First, a reversed saphenous vein graft was placed end-to-side to the first  diagonal branch of the LAD.  This was a 1.5-mm diffusely diseased, poor-  quality target.  Vein graft was fair.  The anastomosis was performed with a  running 7-0 Prolene suture.  All anastomoses were probed proximally and  distally to ensure patency at their completion and cardioplegia was  administered at the completion of each vein graft.   Next, a reversed saphenous vein graft was placed end-to-side to the mid-to-  distal posterior descending.  This was a 1.5-mm vessel at the site of the  anastomosis, but only a 1-mm probe would pass distally.  It was diffusely  diseased.  There was a tight stenosis just proximal to the anastomosis.   Next, a reversed saphenous vein graft was placed sequentially to the ramus  intermedius and obtuse marginal #2.  The anastomosis to the ramus was  performed off the side branch of the vein graft.  The ramus was a 1.5-mm  vessel, but bifurcated and only a 1-mm probe would pass distally past the  bifurcation.  It was a poor-quality vessel.  Side-to-side anastomosis was  performed off the side branch of the vein graft to this vessel.  The distal end then was cut to length and was  anastomosed end-to-side to the second  obtuse marginal.  The patient had a group of obtuse marginals that were 4  small branches supplying the lateral wall; the second one was the largest of  the 4; it was 1 mm and of poor quality.  The remaining vessels were all  completely ungraftable.   Next, the left internal mammary artery was brought through a window in the  pericardium.  The distal end was spatulated and it was anastomosed end-to-  side to the LAD.  The LAD was a 2-mm good-quality target.  The mammary was 2-  mm good-quality conduit.  The anastomosis was performed with a running 7-0  Prolene suture.  At the completion  of the mammary-to0LAD anastomosis, the  bulldog clamp was briefly removed and inspected for hemostasis.  Immediate  and rapid septal rewarming was noted.  The bulldog clamp was replaced and  the mammary pedicle was tacked to the epicardial surface of the heart with 6-  0 Prolene sutures.   Additional cardioplegia was administered via the retrograde cannula.  The  vein grafts were cut to length and the proximal vein graft anastomoses were  performed to 4.5-mm punch aortotomies with running 6-0 Prolene sutures.  At  the completion of the final proximal anastomoses, the patient is placed  Trendelenburg position.  The bulldog clamp was again removed from the left  mammary artery and additional dose of retrograde cardioplegia was  administered to de-air the coronaries and aortic root.  The aortic root was  de-aired and aortic crossclamp was removed.  The total crossclamp time was  85 minutes.  The patient spontaneously resumed a bradycardic rhythm.  While  being rewarmed, all proximal and distal anastomoses were inspected for  hemostasis.  Epicardial pacing wires were placed on the right ventricle and  right atrium.  When the patient had rewarmed to a core temperature of 37  degrees Celsius, he was weaned from cardiopulmonary bypass.  He was on a low-  dose dopamine  infusion at 5 mcg/kg per minute and DOO-paced for rate at time  of separation from bypass.  The total bypass time was 133 minutes.  The  intra-aortic balloon pump which h ad been placed in the catheterization  laboratory was turned back on and one-to-one while weaning the patient from  bypass.  The initial cardiac index was greater than 2 L/min per meter  squared and the patient remained hemodynamically stable throughout post-  bypass period.   Post bypass transesophageal echocardiography revealed preserved left jugular  function and no change in the mild mitral and aortic regurgitation.   A test dose of protamine was administered and was well-tolerated.  The  atrial and aortic cannulae were removed.  The remainder of the protamine was  administered without incident.  The chest irrigated with 1 L of warm normal  saline containing 1 g of vancomycin.  Hemostasis was achieved.  A left  pleural and 2 mediastinal chest tubes were placed through separate subcostal incisions.  The sternum was closed with interrupted heavy-gauge stainless  steel wires.  Hemodynamics remained stable with sternal closure.  The  remainder of the incisions were closed in standard fashion.  Subcuticular  closure was used for the skin.  All sponge, needle and instrument counts  were correct at the end of the procedure.  The patient was taken from the  operating room to the surgical intensive care unit in critical, but stable  condition.   The patient is not a candidate for redo grafting.           ______________________________  Salvatore Decent. Dorris Fetch, M.D.     SCH/MEDQ  D:  03/28/2006  T:  03/29/2006  Job:  [id number]   cc:   Antionette Char, MD  Micheline Chapman, MD  Sharlot Gowda, M.D.

## 2011-01-12 NOTE — Op Note (Signed)
NAMEARYAV, WIMBERLY               ACCOUNT NO.:  [id number]   MEDICAL RECORD NO.:  [id number]          PATIENT TYPE:  AMB   LOCATION:  ENDO                         FACILITY:  [facility]   PHYSICIAN:  Georgiana Spinner, M.D.    DATE OF BIRTH:  14-Feb-1920   DATE OF PROCEDURE:  08/25/2004  DATE OF DISCHARGE:                                 OPERATIVE REPORT   PROCEDURE:  Upper endoscopy with biopsy.   INDICATIONS:  Gastroesophageal reflux disease, abdominal pain.   ANESTHESIA:  Demerol 40, Versed 2 mg.   PROCEDURE:  With the patient mildly sedated in the left lateral decubitus  position, the Olympus videoscopic endoscope was inserted in the mouth and  passed under direct vision through the esophagus which appeared normal until  we reached the distal esophagus, and there appeared to be an area of  Barrett's photographed and subsequently biopsied. We entered into the  stomach. Fundus, body, antrum, duodenal bulb, and second portion of the  duodenum were visualized. From this point, the endoscope was slowly  withdrawn, taking circumferential views of the duodenal mucosa until the  endoscope and pulled back into the stomach, placed in retroflexion and  viewed the stomach from below. The endoscope was then straightened and  withdrawn, taking circumferential views of the remaining gastric and  esophageal mucosa, stopping in the body, antrum and fundus of the stomach  were diffuse erythema was seen, photographed, and biopsied. The patient's  vital signs and pulse oximetry remained stable. The patient tolerated the  procedure well without apparent complications.   FINDINGS:  Diffuse redness of stomach photographed and biopsied. Await  biopsy report. The patient will call me for results and follow up with me as  an outpatient. Will also order CT scan of the abdomen to be done.      GMO/MEDQ  D:  08/25/2004  T:  08/25/2004  Job:  [id number]

## 2011-01-12 NOTE — Discharge Summary (Signed)
[name], [name]               ACCOUNT NO.:  [id number]   MEDICAL RECORD NO.:  [id number]          PATIENT TYPE:  INP   LOCATION:  3040                         FACILITY:  [facility]   PHYSICIAN:  [name] Savage, MD        DATE OF BIRTH:  1919-09-27   DATE OF ADMISSION:  10/08/2006  DATE OF DISCHARGE:  10/09/2006                               DISCHARGE SUMMARY   PRIMARY CARE PHYSICIAN:  Dr. Sharlot [name].   CONSULTATIONS:  In the hospital, Dr. Pearlean [name] saw from neurology.   DISCHARGE DIAGNOSES:  1. Recurrent transient ischemic attack (TIA).  2. Hypothyroidism.  3. History of coronary artery disease.  4. History of myocardial infarction (MI).  5. Transient atrial fibrillation.  6. Hypertension.  7. Hyperlipidemia.   DISCHARGE MEDICATIONS:  1. Aggrenox one capsule twice daily.  2. Synthroid 100 mcg once daily.  3. Proscar 5 mg once daily.  4. Norvasc 5 mg once daily.  5. Lipitor 20 mg once daily.   HISTORY OF PRESENT ILLNESS:  For a full history and physical, see the  history and physical dictated by Dr. Corky [name] on October 08, 2006.  In  short [name] is a pleasant 77 year old male who was recently  hospitalized for a left frontal stroke on MRI.  He came in with  recurrent right upper extremity numbness and tingling.  He had no other  symptoms or complaints and he was admitted for further workup.   PROCEDURES DONE IN THE HOSPITAL:  1. He had a CT head without contrast done on October 08, 2006, which      showed small focus of encephalomalacia on the left frontal lobe      consistent with infarct, no new abnormality.  He had an MRI of the      brain done October 08, 2006, which showed a chronic left frontal      lobe infarct with evidence of prior hemorrhage diffusely unchanged      with a small focal area of hyperdensity in the left frontal lobe.  2. He had an MRA done October 08, 2006, which showed severe stenosis      in the distal right vertebral artery.   PROBLEM LIST:  1. Numbness in right and lower extremities.  He was admitted for      further evaluation and an MRI was done, which did not show any new      stroke.  We consulted neurology, Dr. Pearlean [name] saw the patient who      thinks it is probably manifestation of the old stroke.  At this      point in time, he has been stable and he has been cleared for      discharge from neurology.  He has been changed from aspirin and      Plavix to Aggrenox for better antiplatelet action.  He will follow      up with neurology in two months.  2. New onset hypothyroidism.  He was found to be hypothyroid with a      TSH of 32 on admission.  He also  had a free T4, which was low.  He      was started on Synthroid at 100 mcg a day.  He will continue his      Synthroid and he will get his TSH checked in 4 to 6 weeks by his      primary care doctor.  3. History of coronary artery disease.  We will continue the same      medications as before.   DISPOSITION:  At this point in time, he is being discharged home in a  stable condition.  He was seen by physical therapy and he has no therapy  needed at this point in time.   FOLLOWUP:  He will follow up with his primary doctor, Dr. Sharlot [name]  in one week's time.  He will also follow up with Dr. Pearlean [name] the  neurologist in two months.  He has been given the phone number [phone].      [name] Savage, MD  Electronically Signed     PKN/MEDQ  D:  10/09/2006  T:  10/10/2006  Job:  [id number]   cc:   [name], M.D.  [name] P. [name] Brownie, MD

## 2011-01-12 NOTE — Cardiovascular Report (Signed)
NAMEALIXANDER, RALLIS NO.:  [id number]   MEDICAL RECORD NO.:  [id number]          PATIENT TYPE:  INP   LOCATION:  2399                         FACILITY:  [facility]   PHYSICIAN:  Micheline Chapman, MD   DATE OF BIRTH:  Jun 22, 1920   DATE OF PROCEDURE:  03/28/2006  DATE OF DISCHARGE:                              CARDIAC CATHETERIZATION   PROCEDURE PERFORMED:  Left heart catheterization, selective coronary  angiogram, intra-aortic balloon pump insertion.   INDICATIONS:  Mr. Cicio is a very pleasant 77 year old male who presented  with a non-ST elevation myocardial infarction.  He has objective findings of  anterolateral T wave changes as well as elevated cardiac biomarkers.  He has  had crescendo anginal symptoms over the past several weeks.  He was referred  for cardiac catheterization to define his coronary anatomy.   PROCEDURE:  The risks and indications were explained to the patient.  He  fully understood, and informed consent was obtained.  The right groin was  prepped and draped in a normal sterile fashion.  Conscious sedation was  given with 1 mg of intravenous Versed.  Lidocaine 1% was used to anesthetize  the groin.  Arterial access was obtained using the modified Seldinger  technique, and a 6 French right femoral arterial sheath was placed.  Catheters used included a 6 French JR4 pigtail catheter, JL4, JL35, AL1, AL2  multipurpose, and finally a JL3.0 guiding catheter.  At the completion of  the diagnostic catheterization, an intra-aortic balloon pump was placed due  to ongoing ischemic EKG changes, chest pain, and high left ventricular  filling pressures.   FINDINGS:  Aortic pressure was 120/62 with a mean of 88.  The left  ventricular pressure was 136/27 with an end-diastolic pressure of 37 mmHg.  The right coronary artery was a large caliber, diffusely diseased vessel.  Proximally, there is diffuse 30-50% stenoses.  Throughout the remainder of  the mid and  distal right coronary artery, there are luminal irregularities  present that do not appear flow obstructive.  The right coronary artery is  dominant.  It branches into a large PDA that has high grade disease of at  least 75%.  The posterior AV segment has high-grade disease in the left  posterolateral branches, are subtotally occluded, and fill late.   LEFT CORONARY ARTERY:  The left coronary artery was very difficult to  engage.  We initially attempted engaging a JL4 followed by a JL35, AL1, AL2  multipurpose catheter and finally were able to engage with a JL3.0 guiding  catheter.  There was immediate pressure damping when the catheter went into  the left mainstem.  Subsequent views of the left coronary artery  demonstrated a very high grade ostial left main lesion with the left main  having an eccentric orifice.  There was haziness off of the end of the  catheter, also demonstrating critical left main disease.  The left main  bifurcated into the LAD and left circumflex.  The distal left main had a 30-  50% stenosis.  The left anterior descending had mild disease in the proximal  segments.  The mid LAD has high grade disease with an approximate 80%  stenosis.  The remainder of the LAD has mild disease.  The LAD gives off two  large diagonal branches, both of which have high grade disease.  The left  circumflex has a large trifurcating obtuse marginal branch that the third  segment has an 80% lesion.  The remainder of the circumflex does not have  significant disease.   A pigtail was placed in the left ventricle and demonstrated very high  filling pressures with the left ventricular end-diastolic pressure of 37.   At this point, with the patient's ongoing symptoms, which included chest  pain and significant ischemic EKG changes associated with high LV filling  pressures, we elected to insert an intra-aortic balloon pump.  We also  called an urgent cardiac surgery consult, and Dr.  Dorris Fetch came down to  the cath lab to evaluate Mr. Dunnam.   Intra-aortic balloon pump was inserted by changing out his femoral arterial  sheath from a 6 [place] to an 8 [place] over a wire.  The balloon pump was  then advanced and placed in the aorta without problems.  Prior to this, a  thoracic aortic angiogram was performed and showed no high grade aortic  atherosclerotic disease.   ASSESSMENT:  1.  Triple vessel coronary artery disease with critical left main disease.  2.  Non-ST elevation myocardial infarction.  3.  Elevated left ventricular filling pressures.   PLAN:  Balloon pump insertion, as above.  Started the patient on 1000 units  of heparin per hour.  An emergent surgery consult has been placed, and Dr.  Dorris Fetch plans to take Mr. Molder to surgery immediately.  The findings  and plan were reviewed in detail with the patient as well as his family, who  understand and are in agreement.  Dr. Gala Romney was the proctoring  physician.      Micheline Chapman, MD  Electronically Signed     MDC/MEDQ  D:  03/28/2006  T:  03/28/2006  Job:  [id number]

## 2011-01-15 ENCOUNTER — Ambulatory Visit (INDEPENDENT_AMBULATORY_CARE_PROVIDER_SITE_OTHER): Payer: Medicare Other | Admitting: Internal Medicine

## 2011-01-15 ENCOUNTER — Other Ambulatory Visit (INDEPENDENT_AMBULATORY_CARE_PROVIDER_SITE_OTHER): Payer: Medicare Other

## 2011-01-15 ENCOUNTER — Encounter: Payer: Self-pay | Admitting: Internal Medicine

## 2011-01-15 ENCOUNTER — Telehealth: Payer: Self-pay | Admitting: *Deleted

## 2011-01-15 VITALS — BP 100/44 | HR 69 | Temp 97.7°F | Resp 16 | Wt 182.0 lb

## 2011-01-15 DIAGNOSIS — R7309 Other abnormal glucose: Secondary | ICD-10-CM

## 2011-01-15 DIAGNOSIS — N189 Chronic kidney disease, unspecified: Secondary | ICD-10-CM

## 2011-01-15 DIAGNOSIS — I10 Essential (primary) hypertension: Secondary | ICD-10-CM

## 2011-01-15 DIAGNOSIS — R739 Hyperglycemia, unspecified: Secondary | ICD-10-CM

## 2011-01-15 DIAGNOSIS — E039 Hypothyroidism, unspecified: Secondary | ICD-10-CM

## 2011-01-15 LAB — COMPREHENSIVE METABOLIC PANEL
ALT: 15 U/L (ref 0–53)
Albumin: 3.8 g/dL (ref 3.5–5.2)
CO2: 29 mEq/L (ref 19–32)
Chloride: 104 mEq/L (ref 96–112)
GFR: 23.08 mL/min — ABNORMAL LOW (ref >60.00)
Glucose, Bld: 130 mg/dL — ABNORMAL HIGH (ref 70–99)
Potassium: 4.7 mEq/L (ref 3.5–5.1)
Sodium: 143 mEq/L (ref 135–145)
Total Protein: 6.8 g/dL (ref 6.0–8.3)

## 2011-01-15 LAB — HEMOGLOBIN A1C: Hgb A1c MFr Bld: 6.7 % — ABNORMAL HIGH (ref 4.6–6.5)

## 2011-01-15 NOTE — Patient Instructions (Signed)

## 2011-01-15 NOTE — Assessment & Plan Note (Signed)
He is due for a TSH check

## 2011-01-15 NOTE — Assessment & Plan Note (Signed)
I will check his A1C to see if he has type II DM and if he needs meds

## 2011-01-15 NOTE — Assessment & Plan Note (Signed)
His BP is well controlled 

## 2011-01-15 NOTE — Telephone Encounter (Signed)
Spoke w/wife, Pt has had low cbg's in the am recently - in 50's fasting. Advised OV soon and to continue to monitor. Pt report that he is feeling well. Transferred to scheduler for OV.

## 2011-01-15 NOTE — Progress Notes (Signed)
  Subjective:    Patient ID: [name], male    DOB: [birth year], 77 y.o.   MRN: [id number]  HPI He has been checking his blood sugar at home and tells me that it has been as high as 150 over the last few days.   Review of Systems  Constitutional: Negative for fever, chills, diaphoresis, activity change, appetite change, fatigue and unexpected weight change.  HENT: Negative for facial swelling, neck pain and neck stiffness.   Eyes: Negative for visual disturbance.  Respiratory: Negative for cough, choking, shortness of breath, wheezing and stridor.   Cardiovascular: Negative for chest pain, palpitations and leg swelling.  Gastrointestinal: Negative for nausea, vomiting, abdominal pain, diarrhea, constipation and blood in stool.  Genitourinary: Negative for dysuria, urgency, frequency, hematuria, decreased urine volume, enuresis and difficulty urinating.  Musculoskeletal: Negative for myalgias, back pain, joint swelling, arthralgias and gait problem.  Skin: Negative for color change, pallor and rash.  Neurological: Negative for dizziness, tremors, seizures, syncope, facial asymmetry, speech difficulty, weakness, light-headedness, numbness and headaches.  Hematological: Negative for adenopathy. Does not bruise/bleed easily.  Psychiatric/Behavioral: Negative.        Objective:   Physical Exam  Vitals reviewed. Constitutional: He is oriented to person, place, and time. He appears well-developed and well-nourished. No distress.  HENT:  Head: Normocephalic and atraumatic.  Right Ear: External ear normal.  Left Ear: External ear normal.  Nose: Nose normal.  Mouth/Throat: No oropharyngeal exudate.  Eyes: Conjunctivae and EOM are normal. Pupils are equal, round, and reactive to light. Right eye exhibits no discharge. Left eye exhibits no discharge. No scleral icterus.  Neck: Normal range of motion. Neck supple. No JVD present. No tracheal deviation present. No thyromegaly present.    Cardiovascular: Normal rate, regular rhythm, normal heart sounds and intact distal pulses.  Exam reveals no gallop and no friction rub.   No murmur heard. Pulmonary/Chest: Effort normal and breath sounds normal. No stridor. No respiratory distress. He has no wheezes. He has no rales. He exhibits no tenderness.  Abdominal: Soft. Bowel sounds are normal. He exhibits no distension and no mass. There is no tenderness. There is no rebound and no guarding.  Musculoskeletal: Normal range of motion. He exhibits no edema and no tenderness.  Lymphadenopathy:    He has no cervical adenopathy.  Neurological: He is alert and oriented to person, place, and time. He has normal reflexes. He displays normal reflexes. No cranial nerve deficit. He exhibits normal muscle tone. Coordination normal.  Skin: Skin is warm and dry. No rash noted. He is not diaphoretic. No erythema. No pallor.  Psychiatric: He has a normal mood and affect. His behavior is normal. Judgment and thought content normal.          Assessment & Plan:

## 2011-01-16 ENCOUNTER — Encounter: Payer: Self-pay | Admitting: Internal Medicine

## 2011-01-24 ENCOUNTER — Ambulatory Visit (INDEPENDENT_AMBULATORY_CARE_PROVIDER_SITE_OTHER): Payer: Medicare Other | Admitting: *Deleted

## 2011-01-24 DIAGNOSIS — Z7901 Long term (current) use of anticoagulants: Secondary | ICD-10-CM

## 2011-01-24 DIAGNOSIS — I4891 Unspecified atrial fibrillation: Secondary | ICD-10-CM

## 2011-01-24 DIAGNOSIS — Z8679 Personal history of other diseases of the circulatory system: Secondary | ICD-10-CM

## 2011-01-28 ENCOUNTER — Other Ambulatory Visit: Payer: Self-pay | Admitting: Cardiology

## 2011-01-30 ENCOUNTER — Other Ambulatory Visit: Payer: Self-pay

## 2011-01-30 MED ORDER — LEVOTHYROXINE SODIUM 100 MCG PO TABS: 100.0000 ug | ORAL_TABLET | Freq: Every day | ORAL | Status: DC

## 2011-01-30 NOTE — Telephone Encounter (Signed)
TALKED WITH PT HE IS AWARE OF 30 DAY SUPPLY ONLY HE GOING TO A NEW PCP

## 2011-02-05 ENCOUNTER — Telehealth: Payer: Self-pay

## 2011-02-05 ENCOUNTER — Other Ambulatory Visit: Payer: Self-pay | Admitting: Family Medicine

## 2011-02-05 MED ORDER — FINASTERIDE 5 MG PO TABS: 5.0000 mg | ORAL_TABLET | Freq: Every day | ORAL | Status: DC

## 2011-02-05 MED ORDER — LEVOTHYROXINE SODIUM 100 MCG PO TABS: 100.0000 ug | ORAL_TABLET | Freq: Every day | ORAL | Status: DC

## 2011-02-05 NOTE — Telephone Encounter (Signed)
Dr. Susann Givens what do they mean

## 2011-02-05 NOTE — Telephone Encounter (Signed)
What does this mean

## 2011-02-07 NOTE — Telephone Encounter (Signed)
error 

## 2011-02-12 ENCOUNTER — Telehealth: Payer: Self-pay | Admitting: Cardiology

## 2011-02-12 ENCOUNTER — Other Ambulatory Visit: Payer: Self-pay | Admitting: Cardiology

## 2011-02-12 ENCOUNTER — Encounter: Payer: Self-pay | Admitting: Physician Assistant

## 2011-02-12 ENCOUNTER — Ambulatory Visit (INDEPENDENT_AMBULATORY_CARE_PROVIDER_SITE_OTHER): Payer: Medicare Other | Admitting: Physician Assistant

## 2011-02-12 VITALS — BP 118/68 | HR 78 | Ht 70.0 in | Wt 190.0 lb

## 2011-02-12 DIAGNOSIS — I4891 Unspecified atrial fibrillation: Secondary | ICD-10-CM

## 2011-02-12 DIAGNOSIS — I2589 Other forms of chronic ischemic heart disease: Secondary | ICD-10-CM

## 2011-02-12 DIAGNOSIS — R0609 Other forms of dyspnea: Secondary | ICD-10-CM

## 2011-02-12 DIAGNOSIS — I2510 Atherosclerotic heart disease of native coronary artery without angina pectoris: Secondary | ICD-10-CM

## 2011-02-12 DIAGNOSIS — I5042 Chronic combined systolic (congestive) and diastolic (congestive) heart failure: Secondary | ICD-10-CM | POA: Insufficient documentation

## 2011-02-12 DIAGNOSIS — I5043 Acute on chronic combined systolic (congestive) and diastolic (congestive) heart failure: Secondary | ICD-10-CM

## 2011-02-12 MED ORDER — TORSEMIDE 20 MG PO TABS: 40.0000 mg | ORAL_TABLET | Freq: Two times a day (BID) | ORAL | Status: DC

## 2011-02-12 NOTE — Progress Notes (Signed)
History of Present Illness: Primary Cardiologist:  Dr. Rollene Rotunda  [name] is a 77 y.o. male with a history of CAD, status post CABG in 2007, status post stent to the obtuse marginal in December 2008, combined systolic and diastolic heart failure, chronic kidney disease and permanent atrial fibrillation on chronic Coumadin therapy.  He was last seen in March 2012.  At that time he was stable with a weight of 176 pounds.  He has been somewhat noncompliant with his diet.  He has also missed some of his diuretics.  He went out to eat yesterday for Father's Day.  He's been going out to eat a lot.  He does note increased dyspnea with exertion last night.  This was getting into bed.  He denies orthopnea or PND.  He has noted some lower extremity edema.  He denies any chest discomfort.  He denies syncope.  He does note a cough.  He denies purulent sputum or hemoptysis.  Past Medical History  Diagnosis Date  . Coronary artery disease     a.  s/p CABG 2007;  b. cath 12/08: LM occluded, pRCA 80%, dRCA 50%, PLB 90%, S-PDA ok, S-Dx ok with occluded dist Dx; L-LAD ok, S-OM ok with 90% after graft insertion . . . . distal OM treated with DES  . Cardiomyopathy     echo 1/10: EF 45%, inf and post HK, mild to mod LVH, mild AI, mod MR, LAE  . GERD (gastroesophageal reflux disease)   . Hypertension   . Atrial fibrillation   . Chronic renal insufficiency   . CVA (cerebral infarction)     left frontal  . Anemia     chronic  . Hypothyroidism     Current Outpatient Prescriptions  Medication Sig Dispense Refill  . aspirin 81 MG tablet Take 81 mg by mouth daily.        . carvedilol (COREG) 6.25 MG tablet Take 6.25 mg by mouth 2 (two) times daily with a meal.        . fenofibrate 54 MG tablet Take 54 mg by mouth daily.        . ferrous sulfate 325 (65 FE) MG tablet 2 tablets by mouth daily       . finasteride (PROSCAR) 5 MG tablet Take 1 tablet (5 mg total) by mouth daily.  30 tablet  11  .  L-Methylfolate-B6-B12 (METANX) 3-35-2 MG TABS Take by mouth 2 (two) times daily.        Marland Kitchen levothyroxine (SYNTHROID) 100 MCG tablet Take 1 tablet (100 mcg total) by mouth daily.  30 tablet  11  . simvastatin (ZOCOR) 20 MG tablet Take 20 mg by mouth at bedtime.        . torsemide (DEMADEX) 20 MG tablet TAKE ONE AND A HALF (1 & 1/2) TABLET(S) EVERY MORNING & 1 IN THE EVENING  75 tablet  9  . warfarin (COUMADIN) 2.5 MG tablet USE AS DIRECTED BY ANTICOAGULATION CLINIC  40 tablet  2  . DISCONTD: B Complex-C (B-COMPLEX WITH VITAMIN C) tablet Take 1 tablet by mouth daily.        Marland Kitchen LORazepam (ATIVAN) 0.5 MG tablet Take 1/2 tablet every 8 hours as needed       . nitroGLYCERIN (NITROSTAT) 0.4 MG SL tablet Place 0.4 mg under the tongue every 5 (five) minutes as needed.        Marland Kitchen DISCONTD: SYNTHROID 100 MCG tablet TAKE ONE TABLET BY MOUTH ONE TIME DAILY  30  tablet  0  . DISCONTD: torsemide (DEMADEX) 20 MG tablet 1 1/2 by mouth in the morning and 1 in the afternoon         Allergies: Allergies  Allergen Reactions  . Ace Inhibitors     Social history:  Nonsmoker  ROS:  See history of present illness.  Denies fevers, chills, melena, hematochezia, hematuria.  His daughter is with him and states he had some confusion a couple of weeks ago.  All other systems reviewed and negative.  Vital Signs: BP 118/68  Pulse 78  Ht 5\' 10"  (1.778 m)  Wt 190 lb (86.183 kg)  BMI 27.26 kg/m2  PHYSICAL EXAM: Well nourished, well developed, in no acute distress HEENT: normal Neck: Positive JVD; positive HJR Endocrine: No thyromegaly Cardiac:  normal S1, S2; Irregularly irregular rhythm; 2/6 holosystolic murmur heard best at the fracture Lungs:  clear to auscultation bilaterally, no wheezing, rhonchi or rales Abd: soft, nontender, no hepatomegaly Ext: 1+ bilateral edema up to his knees  Skin: warm and dry Neuro:  CNs 2-12 intact, no focal abnormalities noted Psych: Normal affect.  EKG:  Atrial fibrillation, heart  rate 79, normal axis, nonspecific ST-T wave changes, PVC, poor R wave progression, no significant changes previous tracing  ASSESSMENT AND PLAN:

## 2011-02-12 NOTE — Telephone Encounter (Signed)
Per wife - states pt has been up most all night for the past 2 nights with NPC.  Any walking causes extreme SOB stating "that just takes my breath away" with walking from bedroom to living room.  Also having trouble with restless legs.  appt given for today at 2:30pm/  Wife agreeable.

## 2011-02-12 NOTE — Assessment & Plan Note (Signed)
Repeat echocardiogram. 

## 2011-02-12 NOTE — Assessment & Plan Note (Addendum)
He has gained 14 pounds since his last visit.  He admits to dietary indiscretion with salt as well as missing his torsemide doses.  I will increase his torsemide to 20 mg 2 tablets in the morning and 2 tablets in the afternoon.  I will check a basic metabolic panel today as well as a BNP and CBC.  He will have a followup echocardiogram since it has been 2 and a half years since his last assessment.  He will begin watching his salt.  He will also weigh himself daily.  He knows to call us if his weight is not decreasing over the next week.  He will be brought back in close followup in one week with either Dr. Antoine Poche or me.

## 2011-02-12 NOTE — Telephone Encounter (Signed)
Pt having sob for two days with any excertion, coughing mildly at night when lays down, during the day while sitting-wife called back said he "thrashed about all night" wants appt today

## 2011-02-12 NOTE — Assessment & Plan Note (Signed)
Permanent.  Rate controlled.  He remains on Coumadin.  Check a CBC.

## 2011-02-12 NOTE — Assessment & Plan Note (Signed)
Followup in one week.  If his volume improves and he continues to complain of breathlessness with activity, consider proceeding with stress testing.

## 2011-02-12 NOTE — Patient Instructions (Addendum)
Your physician recommends that you weigh, daily, at the same time every day, and in the same amount of clothing. Please record your daily weights on the handout provided and bring it to your next appointment.  Your physician has requested that you have an echocardiogram 414.8 02/22/11 @ 1 PM. Echocardiography is a painless test that uses sound waves to create images of your heart. It provides your doctor with information about the size and shape of your heart and how well your heart's chambers and valves are working. This procedure takes approximately one hour. There are no restrictions for this procedure.  Your physician recommends that you return for lab work in: TODAY BMET, BNP 427.31, CBC W/DIFF 414.8, 428.43, 427.31.  Your physician recommends that you schedule a follow-up appointment in: 02/22/11 TO SEE SCOTT WEAVER, PA-C SAME DAY YOU WILL HAVE REPEAT LAB WORK.   Your physician has recommended you make the following change in your medication: INCREASE DEMADEX 40 MG TWICE DAILY  WATCH SODIUM INTAKE

## 2011-02-13 LAB — BASIC METABOLIC PANEL WITH GFR
BUN: 36 mg/dL — ABNORMAL HIGH (ref 6–23)
CO2: 25 meq/L (ref 19–32)
Calcium: 9 mg/dL (ref 8.4–10.5)
Chloride: 110 meq/L (ref 96–112)
Creatinine, Ser: 2.4 mg/dL — ABNORMAL HIGH (ref 0.4–1.5)
GFR: 27.36 mL/min — ABNORMAL LOW
Glucose, Bld: 97 mg/dL (ref 70–99)
Potassium: 4.7 meq/L (ref 3.5–5.1)
Sodium: 140 meq/L (ref 135–145)

## 2011-02-21 ENCOUNTER — Encounter: Payer: Medicare Other | Admitting: *Deleted

## 2011-02-21 ENCOUNTER — Other Ambulatory Visit (HOSPITAL_COMMUNITY): Payer: Medicare Other | Admitting: Radiology

## 2011-02-22 ENCOUNTER — Ambulatory Visit (INDEPENDENT_AMBULATORY_CARE_PROVIDER_SITE_OTHER): Payer: Medicare Other | Admitting: Physician Assistant

## 2011-02-22 ENCOUNTER — Encounter: Payer: Self-pay | Admitting: Physician Assistant

## 2011-02-22 ENCOUNTER — Ambulatory Visit (INDEPENDENT_AMBULATORY_CARE_PROVIDER_SITE_OTHER): Payer: Medicare Other | Admitting: *Deleted

## 2011-02-22 ENCOUNTER — Ambulatory Visit (HOSPITAL_COMMUNITY): Payer: Medicare Other | Attending: Internal Medicine | Admitting: Radiology

## 2011-02-22 ENCOUNTER — Other Ambulatory Visit (INDEPENDENT_AMBULATORY_CARE_PROVIDER_SITE_OTHER): Payer: Medicare Other | Admitting: *Deleted

## 2011-02-22 VITALS — BP 115/59 | HR 71 | Ht 70.0 in | Wt 179.0 lb

## 2011-02-22 DIAGNOSIS — I5022 Chronic systolic (congestive) heart failure: Secondary | ICD-10-CM

## 2011-02-22 DIAGNOSIS — Z8679 Personal history of other diseases of the circulatory system: Secondary | ICD-10-CM

## 2011-02-22 DIAGNOSIS — I4891 Unspecified atrial fibrillation: Secondary | ICD-10-CM

## 2011-02-22 DIAGNOSIS — I080 Rheumatic disorders of both mitral and aortic valves: Secondary | ICD-10-CM | POA: Insufficient documentation

## 2011-02-22 DIAGNOSIS — I079 Rheumatic tricuspid valve disease, unspecified: Secondary | ICD-10-CM | POA: Insufficient documentation

## 2011-02-22 DIAGNOSIS — D539 Nutritional anemia, unspecified: Secondary | ICD-10-CM

## 2011-02-22 DIAGNOSIS — I2589 Other forms of chronic ischemic heart disease: Secondary | ICD-10-CM

## 2011-02-22 DIAGNOSIS — Z7901 Long term (current) use of anticoagulants: Secondary | ICD-10-CM

## 2011-02-22 DIAGNOSIS — I2510 Atherosclerotic heart disease of native coronary artery without angina pectoris: Secondary | ICD-10-CM

## 2011-02-22 DIAGNOSIS — I10 Essential (primary) hypertension: Secondary | ICD-10-CM

## 2011-02-22 DIAGNOSIS — I509 Heart failure, unspecified: Secondary | ICD-10-CM | POA: Insufficient documentation

## 2011-02-22 DIAGNOSIS — I379 Nonrheumatic pulmonary valve disorder, unspecified: Secondary | ICD-10-CM | POA: Insufficient documentation

## 2011-02-22 LAB — CBC WITH DIFFERENTIAL/PLATELET
Basophils Absolute: 0 10*3/uL (ref 0.0–0.1)
Basophils Relative: 0.2 % (ref 0.0–3.0)
Eosinophils Absolute: 0.3 10*3/uL (ref 0.0–0.7)
Hemoglobin: 10.8 g/dL — ABNORMAL LOW (ref 13.0–17.0)
MCHC: 33.9 g/dL (ref 30.0–36.0)
MCV: 94.5 fl (ref 78.0–100.0)
Monocytes Absolute: 0.4 10*3/uL (ref 0.1–1.0)
Neutro Abs: 4.2 10*3/uL (ref 1.4–7.7)
Neutrophils Relative %: 66.5 % (ref 43.0–77.0)
RBC: 3.39 Mil/uL — ABNORMAL LOW (ref 4.22–5.81)
RDW: 14.9 % — ABNORMAL HIGH (ref 11.5–14.6)

## 2011-02-22 LAB — BASIC METABOLIC PANEL
BUN: 38 mg/dL — ABNORMAL HIGH (ref 6–23)
Calcium: 8.6 mg/dL (ref 8.4–10.5)
Creatinine, Ser: 2.5 mg/dL — ABNORMAL HIGH (ref 0.4–1.5)
GFR: 26.59 mL/min — ABNORMAL LOW (ref >60.00)

## 2011-02-22 NOTE — Assessment & Plan Note (Signed)
CBC is pending

## 2011-02-22 NOTE — Assessment & Plan Note (Signed)
He has CKD.  So, he is not on an ACE.  At this point, do not think we need to push to get him on hydralazine and nitrates.

## 2011-02-22 NOTE — Assessment & Plan Note (Signed)
Controlled.  Continue current therapy.  

## 2011-02-22 NOTE — Assessment & Plan Note (Signed)
Weight down 11 pounds.  Overall, volume improved.  Ok to continue old dose of torsemide.  He had missed some doses and had increased his salt intake prior to getting volume overloaded.  He will have a BMET today.  CBC also being to done as it was not done last time.  He has follow up with Dr. Antoine Poche in several weeks.  He will keep that appointment.  Echo reviewed from today.  His EF is about the same.  He does have several wall motion abnormalities.  I will review this with Dr. Antoine Poche.  He will continue to weigh himself and contact us if he gains 3 pounds or more in one day.

## 2011-02-22 NOTE — Assessment & Plan Note (Signed)
No angina.  Continue ASA. 

## 2011-02-22 NOTE — Assessment & Plan Note (Signed)
Rate controlled.  Continue coumadin.  INR high today.  Dose adjusted by coumadin clinic.

## 2011-02-22 NOTE — Patient Instructions (Signed)
Your physician recommends that you schedule a follow-up appointment in: 03/20/11 @ 3:45 pm to see Dr. Antoine Poche

## 2011-02-22 NOTE — Progress Notes (Signed)
History of Present Illness: Primary Cardiologist:  Dr. Rollene Rotunda  [name] is a 77 y.o. male with a history of CAD, status post CABG in 2007, status post stent to the obtuse marginal in December 2008, combined systolic and diastolic heart failure, chronic kidney disease and permanent atrial fibrillation on chronic Coumadin therapy.  He recently presented with acute on chronic heart failure.  He had gained 14 pounds and admitted to dietary indiscretion as well as missing his diuretic at times.  I increased his torsemide to 40 mg twice a day.  Lab work at his visit demonstrated a potassium of 4.7, creatinine 2.4 (stable) and BNP of 976.  Echocardiogram was ordered to reassess his LV function.  This was done today.  A CBC was ordered last time, however this was never performed.  He returns today for follow up.  Doing better.  No further dyspnea.  He is probably NYHA class 2b-3.  Sleeps on 1-2 pillows.  No change.  No PND.  Chronic LE edema somewhat improved.  Weights at home down.  He decreased torsemide on his own to his previous dose.  No chest pain.  No symptoms reminiscent of his previous angina.  No syncope.  Past Medical History  Diagnosis Date  . Coronary artery disease     a.  s/p CABG 2007;  b. cath 12/08: LM occluded, pRCA 80%, dRCA 50%, PLB 90%, S-PDA ok, S-Dx ok with occluded dist Dx; L-LAD ok, S-OM ok with 90% after graft insertion . . . . distal OM treated with DES  . Cardiomyopathy     echo 1/10: EF 45%, inf and post HK, mild to mod LVH, mild AI, mod MR, LAE  . GERD (gastroesophageal reflux disease)   . Hypertension   . Atrial fibrillation   . Chronic renal insufficiency   . CVA (cerebral infarction)     left frontal  . Anemia     chronic  . Hypothyroidism     Current Outpatient Prescriptions  Medication Sig Dispense Refill  . aspirin 81 MG tablet Take 81 mg by mouth daily.        . carvedilol (COREG) 6.25 MG tablet Take 6.25 mg by mouth 2 (two) times daily with a  meal.        . fenofibrate 54 MG tablet Take 54 mg by mouth daily.        . ferrous sulfate 325 (65 FE) MG tablet 2 tablets by mouth daily       . finasteride (PROSCAR) 5 MG tablet Take 1 tablet (5 mg total) by mouth daily.  30 tablet  11  . L-Methylfolate-B6-B12 (METANX) 3-35-2 MG TABS Take by mouth 2 (two) times daily.        Marland Kitchen levothyroxine (SYNTHROID) 100 MCG tablet Take 1 tablet (100 mcg total) by mouth daily.  30 tablet  11  . LORazepam (ATIVAN) 0.5 MG tablet Take 1/2 tablet every 8 hours as needed       . nitroGLYCERIN (NITROSTAT) 0.4 MG SL tablet Place 0.4 mg under the tongue every 5 (five) minutes as needed.        . simvastatin (ZOCOR) 20 MG tablet Take 20 mg by mouth at bedtime.        . torsemide (DEMADEX) 20 MG tablet Take 2 tablets (40 mg total) by mouth 2 (two) times daily.  120 tablet  3  . warfarin (COUMADIN) 2.5 MG tablet USE AS DIRECTED BY ANTICOAGULATION CLINIC  40 tablet  2  Allergies: Allergies  Allergen Reactions  . Ace Inhibitors     Social history:  Nonsmoker  Vital Signs: BP 115/59  Pulse 71  Ht 5\' 10"  (1.778 m)  Wt 179 lb (81.194 kg)  BMI 25.68 kg/m2  PHYSICAL EXAM: Well nourished, well developed, in no acute distress HEENT: normal Neck: No JVD Cardiac:  normal S1, S2; Irregularly irregular rhythm; 2/6 holosystolic murmur  Lungs:  clear to auscultation bilaterally, no wheezing, rhonchi or rales Abd: soft, nontender, no hepatomegaly Ext: 1+ bilateral ankle edema Skin: warm and dry Neuro:  CNs 2-12 intact, no focal abnormalities noted Psych: Normal affect.   ASSESSMENT AND PLAN:

## 2011-02-26 ENCOUNTER — Ambulatory Visit: Payer: Medicare Other | Admitting: Cardiology

## 2011-03-08 ENCOUNTER — Ambulatory Visit (INDEPENDENT_AMBULATORY_CARE_PROVIDER_SITE_OTHER): Payer: Medicare Other | Admitting: *Deleted

## 2011-03-08 DIAGNOSIS — Z7901 Long term (current) use of anticoagulants: Secondary | ICD-10-CM

## 2011-03-08 DIAGNOSIS — I4891 Unspecified atrial fibrillation: Secondary | ICD-10-CM

## 2011-03-08 DIAGNOSIS — Z8679 Personal history of other diseases of the circulatory system: Secondary | ICD-10-CM

## 2011-03-08 LAB — POCT INR: INR: 2.4

## 2011-03-16 ENCOUNTER — Encounter: Payer: Self-pay | Admitting: Cardiology

## 2011-03-20 ENCOUNTER — Ambulatory Visit (INDEPENDENT_AMBULATORY_CARE_PROVIDER_SITE_OTHER): Payer: Medicare Other | Admitting: Cardiology

## 2011-03-20 ENCOUNTER — Encounter: Payer: Self-pay | Admitting: Cardiology

## 2011-03-20 DIAGNOSIS — I2589 Other forms of chronic ischemic heart disease: Secondary | ICD-10-CM

## 2011-03-20 DIAGNOSIS — I10 Essential (primary) hypertension: Secondary | ICD-10-CM

## 2011-03-20 DIAGNOSIS — I4891 Unspecified atrial fibrillation: Secondary | ICD-10-CM

## 2011-03-20 NOTE — Assessment & Plan Note (Signed)
He tolerates this rhythm and rate control and anticoagulation. We will continue with the meds as listed.  

## 2011-03-20 NOTE — Progress Notes (Signed)
HPI The patient presents for follow up of the his known cardiomyopathy.  Since I last saw him he has done relatively well. He did have one day when he gained weight and had excess fluid because he had run out of his torsemide. He saw Mr. Alben Spittle and had his dose increased for a brief period. Echo performed at that time was unchanged from previous. He has since had resolution of this and no acute dyspnea. He denies any new edema and his weights have been stable. He's had no chest pressure, neck or arm discomfort. He said no palpitations, presyncope or syncope.  Allergies  Allergen Reactions  . Ace Inhibitors     Current Outpatient Prescriptions on File Prior to Visit  Medication Sig Dispense Refill  . aspirin 81 MG tablet Take 81 mg by mouth daily.        . carvedilol (COREG) 6.25 MG tablet Take 6.25 mg by mouth 2 (two) times daily with a meal.        . fenofibrate 54 MG tablet Take 54 mg by mouth daily.        . ferrous sulfate 325 (65 FE) MG tablet 2 tablets by mouth daily       . finasteride (PROSCAR) 5 MG tablet Take 1 tablet (5 mg total) by mouth daily.  30 tablet  11  . L-Methylfolate-B6-B12 (METANX) 3-35-2 MG TABS Take by mouth 2 (two) times daily.        Marland Kitchen levothyroxine (SYNTHROID) 100 MCG tablet Take 1 tablet (100 mcg total) by mouth daily.  30 tablet  11  . LORazepam (ATIVAN) 0.5 MG tablet Take 1/2 tablet every 8 hours as needed       . nitroGLYCERIN (NITROSTAT) 0.4 MG SL tablet Place 0.4 mg under the tongue every 5 (five) minutes as needed.        . simvastatin (ZOCOR) 20 MG tablet Take 20 mg by mouth at bedtime.        . torsemide (DEMADEX) 20 MG tablet Take 2 tablets (40 mg total) by mouth 2 (two) times daily.  120 tablet  3  . warfarin (COUMADIN) 2.5 MG tablet USE AS DIRECTED BY ANTICOAGULATION CLINIC  40 tablet  2    Past Medical History  Diagnosis Date  . Coronary artery disease     a.  s/p CABG 2007;  b. cath 12/08: LM occluded, pRCA 80%, dRCA 50%, PLB 90%, S-PDA ok, S-Dx ok  with occluded dist Dx; L-LAD ok, S-OM ok with 90% after graft insertion . . . . distal OM treated with DES  . Cardiomyopathy     echo 1/10: EF 45%, inf and post HK, mild to mod LVH, mild AI, mod MR, LAE  . GERD (gastroesophageal reflux disease)   . Hypertension   . Atrial fibrillation   . Chronic renal insufficiency   . CVA (cerebral infarction)     left frontal  . Anemia     chronic  . Hypothyroidism   . Leg muscle spasm     Past Surgical History  Procedure Date  . Coronary artery bypass graft 2007    ROS As stated in the HPI and negative for all other systems.   BP 102/62  Pulse 58  Resp 16  Ht 5\' 11"  (1.803 m)  Wt 174 lb (78.926 kg)  BMI 24.27 kg/m2  PHYSICAL EXAM:  Frail-appearing HEENT:  Pupils equal round and reactive, fundi not visualized NECK:  No jugular venous distention, waveform within normal limits, carotid  upstroke brisk and symmetric, no bruits, no thyromegaly LYMPHATICS:  No cervicalLUNGS:  Clear to auscultation bilaterally BACK:  No CVA tenderness CHEST:  Healed sternotomy scar HEART:  PMI not displaced or sustained,,S1 and S2 within normal limits, no S3, no S4, no clicks, no rubs, no murmurs ABD:  Flat, positive bowel sounds normal in frequency in pitch, no bruits, no rebound, no guarding, no midline pulsatile mass, no hepatomegaly, no splenomegaly EXT:  2 plus pulses throughout, no edema, no cyanosis no clubbing, mild edema SKIN:  No rashes no nodules NEURO:  Cranial nerves II through XII grossly intact, motor grossly intact throughout PSYCH:  Cognitively intact, oriented to person place and time, diminished hearing   ASSESSMENT:

## 2011-03-20 NOTE — Patient Instructions (Signed)
The current medical regimen is effective;  continue present plan and medications.  Follow up in 3 months with Dr Hochrein. 

## 2011-03-20 NOTE — Assessment & Plan Note (Signed)
The blood pressure is at target. No change in medications is indicated. We will continue with therapeutic lifestyle changes (TLC).  

## 2011-03-20 NOTE — Assessment & Plan Note (Signed)
He seems to be euvolemic.  At this point, no change in therapy is indicated.  We have reviewed salt and fluid restrictions.  No further cardiovascular testing is indicated.   

## 2011-03-29 ENCOUNTER — Encounter: Payer: Medicare Other | Admitting: *Deleted

## 2011-04-02 ENCOUNTER — Telehealth: Payer: Self-pay

## 2011-04-02 MED ORDER — SIMVASTATIN 20 MG PO TABS: 20.0000 mg | ORAL_TABLET | Freq: Every day | ORAL | Status: DC

## 2011-04-02 NOTE — Telephone Encounter (Signed)
Receive request for simvastatin

## 2011-04-04 ENCOUNTER — Ambulatory Visit (INDEPENDENT_AMBULATORY_CARE_PROVIDER_SITE_OTHER): Payer: Medicare Other | Admitting: *Deleted

## 2011-04-04 DIAGNOSIS — Z8679 Personal history of other diseases of the circulatory system: Secondary | ICD-10-CM

## 2011-04-04 DIAGNOSIS — Z7901 Long term (current) use of anticoagulants: Secondary | ICD-10-CM

## 2011-04-04 DIAGNOSIS — I4891 Unspecified atrial fibrillation: Secondary | ICD-10-CM

## 2011-04-23 ENCOUNTER — Other Ambulatory Visit: Payer: Self-pay | Admitting: Cardiology

## 2011-05-02 ENCOUNTER — Ambulatory Visit (INDEPENDENT_AMBULATORY_CARE_PROVIDER_SITE_OTHER): Payer: Medicare Other | Admitting: *Deleted

## 2011-05-02 DIAGNOSIS — Z8679 Personal history of other diseases of the circulatory system: Secondary | ICD-10-CM

## 2011-05-02 DIAGNOSIS — I4891 Unspecified atrial fibrillation: Secondary | ICD-10-CM

## 2011-05-02 DIAGNOSIS — Z7901 Long term (current) use of anticoagulants: Secondary | ICD-10-CM

## 2011-05-05 ENCOUNTER — Other Ambulatory Visit: Payer: Self-pay | Admitting: Cardiology

## 2011-05-30 ENCOUNTER — Ambulatory Visit (INDEPENDENT_AMBULATORY_CARE_PROVIDER_SITE_OTHER): Payer: Medicare Other | Admitting: *Deleted

## 2011-05-30 DIAGNOSIS — Z7901 Long term (current) use of anticoagulants: Secondary | ICD-10-CM

## 2011-05-30 DIAGNOSIS — I4891 Unspecified atrial fibrillation: Secondary | ICD-10-CM

## 2011-05-30 DIAGNOSIS — Z8679 Personal history of other diseases of the circulatory system: Secondary | ICD-10-CM

## 2011-06-01 LAB — CARDIAC PANEL(CRET KIN+CKTOT+MB+TROPI)
CK, MB: 26.3 — ABNORMAL HIGH
Relative Index: 12.2 — ABNORMAL HIGH
Total CK: 245 — ABNORMAL HIGH

## 2011-06-01 LAB — CBC
HCT: 27.1 — ABNORMAL LOW
HCT: 28.5 — ABNORMAL LOW
Hemoglobin: 10.4 — ABNORMAL LOW
Hemoglobin: 9.3 — ABNORMAL LOW
Hemoglobin: 9.6 — ABNORMAL LOW
Hemoglobin: 9.8 — ABNORMAL LOW
MCHC: 33.5
MCV: 94.8
MCV: 96.4
Platelets: 156
RBC: 2.85 — ABNORMAL LOW
RBC: 2.94 — ABNORMAL LOW
RBC: 3.01 — ABNORMAL LOW
RDW: 12.8
RDW: 13
WBC: 10.6 — ABNORMAL HIGH
WBC: 10.8 — ABNORMAL HIGH
WBC: 9.7

## 2011-06-01 LAB — POCT CARDIAC MARKERS
Myoglobin, poc: 395
Operator id: 133351
Troponin i, poc: 0.99

## 2011-06-01 LAB — LIPID PANEL
LDL Cholesterol: 60
Total CHOL/HDL Ratio: 3.8
Triglycerides: 38
Triglycerides: 43
VLDL: 9

## 2011-06-01 LAB — COMPREHENSIVE METABOLIC PANEL
ALT: 12
AST: 22
Alkaline Phosphatase: 48
BUN: 25 — ABNORMAL HIGH
CO2: 25
CO2: 29
Calcium: 8.8
Chloride: 107
Creatinine, Ser: 1.96 — ABNORMAL HIGH
GFR calc Af Amer: 40 — ABNORMAL LOW
GFR calc non Af Amer: 33 — ABNORMAL LOW
Glucose, Bld: 127 — ABNORMAL HIGH
Glucose, Bld: 176 — ABNORMAL HIGH
Potassium: 4.3
Total Bilirubin: 0.9

## 2011-06-01 LAB — BASIC METABOLIC PANEL
Calcium: 8.4
Calcium: 8.5
Creatinine, Ser: 2.45 — ABNORMAL HIGH
GFR calc Af Amer: 31 — ABNORMAL LOW
GFR calc Af Amer: 36 — ABNORMAL LOW
GFR calc non Af Amer: 25 — ABNORMAL LOW
GFR calc non Af Amer: 30 — ABNORMAL LOW
Glucose, Bld: 82
Sodium: 140
Sodium: 140

## 2011-06-01 LAB — IRON: Iron: 44

## 2011-06-01 LAB — B-NATRIURETIC PEPTIDE (CONVERTED LAB)
Pro B Natriuretic peptide (BNP): 749 — ABNORMAL HIGH
Pro B Natriuretic peptide (BNP): 961 — ABNORMAL HIGH

## 2011-06-01 LAB — MAGNESIUM: Magnesium: 1.9

## 2011-06-01 LAB — HEMOGLOBIN A1C: Mean Plasma Glucose: 122

## 2011-06-01 LAB — DIFFERENTIAL
Lymphocytes Relative: 14
Lymphs Abs: 1.5
Neutrophils Relative %: 78 — ABNORMAL HIGH

## 2011-06-01 LAB — CK TOTAL AND CKMB (NOT AT ARMC): Relative Index: 9 — ABNORMAL HIGH

## 2011-06-01 LAB — IRON AND TIBC: UIBC: 162

## 2011-06-11 LAB — CROSSMATCH
ABO/RH(D): O POS
ABO/RH(D): O POS
Antibody Screen: NEGATIVE

## 2011-06-12 ENCOUNTER — Other Ambulatory Visit: Payer: Self-pay | Admitting: *Deleted

## 2011-06-12 DIAGNOSIS — I5043 Acute on chronic combined systolic (congestive) and diastolic (congestive) heart failure: Secondary | ICD-10-CM

## 2011-06-12 DIAGNOSIS — I2589 Other forms of chronic ischemic heart disease: Secondary | ICD-10-CM

## 2011-06-12 MED ORDER — TORSEMIDE 20 MG PO TABS: 40.0000 mg | ORAL_TABLET | Freq: Two times a day (BID) | ORAL | Status: DC

## 2011-06-26 ENCOUNTER — Encounter: Payer: Medicare Other | Admitting: *Deleted

## 2011-06-26 ENCOUNTER — Ambulatory Visit (INDEPENDENT_AMBULATORY_CARE_PROVIDER_SITE_OTHER): Payer: Medicare Other | Admitting: Cardiology

## 2011-06-26 ENCOUNTER — Encounter: Payer: Self-pay | Admitting: Cardiology

## 2011-06-26 DIAGNOSIS — I2589 Other forms of chronic ischemic heart disease: Secondary | ICD-10-CM

## 2011-06-26 DIAGNOSIS — I2510 Atherosclerotic heart disease of native coronary artery without angina pectoris: Secondary | ICD-10-CM

## 2011-06-26 DIAGNOSIS — R0602 Shortness of breath: Secondary | ICD-10-CM

## 2011-06-26 DIAGNOSIS — I4891 Unspecified atrial fibrillation: Secondary | ICD-10-CM

## 2011-06-26 DIAGNOSIS — I5022 Chronic systolic (congestive) heart failure: Secondary | ICD-10-CM

## 2011-06-26 DIAGNOSIS — I10 Essential (primary) hypertension: Secondary | ICD-10-CM

## 2011-06-26 DIAGNOSIS — Z7901 Long term (current) use of anticoagulants: Secondary | ICD-10-CM

## 2011-06-26 DIAGNOSIS — Z79899 Other long term (current) drug therapy: Secondary | ICD-10-CM

## 2011-06-26 NOTE — Patient Instructions (Signed)
Please have blood work at your next appt with Dr Amedeo Gory may stop your Asprin  The current medical regimen is effective;  continue present plan and medications.  Follow up with Dr Antoine Poche in 4 months.

## 2011-06-26 NOTE — Assessment & Plan Note (Signed)
The blood pressure is at target. No change in medications is indicated. We will continue with therapeutic lifestyle changes (TLC).  

## 2011-06-26 NOTE — Progress Notes (Signed)
HPI The patient presents for follow up of the his known cardiomyopathy.  Since I last saw him he has done relatively well. He has had no weight gain.  He denies any PND or orthopnea. He is not noticing any palpitations, presyncope or syncope. He had no chest pressure, neck or arm discomfort. He has some chronic mild edema. His weight is unchanged. He is tolerating his blood thinner.  Allergies  Allergen Reactions  . Ace Inhibitors     Current Outpatient Prescriptions on File Prior to Visit  Medication Sig Dispense Refill  . aspirin 81 MG tablet Take 81 mg by mouth daily.        . carvedilol (COREG) 6.25 MG tablet Take 6.25 mg by mouth 2 (two) times daily with a meal.        . fenofibrate 54 MG tablet TAKE ONE (1) TABLET(S) ONCE DAILY  30 tablet  6  . ferrous sulfate 325 (65 FE) MG tablet 2 tablets by mouth daily       . finasteride (PROSCAR) 5 MG tablet Take 1 tablet (5 mg total) by mouth daily.  30 tablet  11  . L-Methylfolate-B6-B12 (METANX) 3-35-2 MG TABS Take by mouth 2 (two) times daily.        Marland Kitchen levothyroxine (SYNTHROID) 100 MCG tablet Take 1 tablet (100 mcg total) by mouth daily.  30 tablet  11  . LORazepam (ATIVAN) 0.5 MG tablet Take 1/2 tablet every 8 hours as needed       . nitroGLYCERIN (NITROSTAT) 0.4 MG SL tablet Place 0.4 mg under the tongue every 5 (five) minutes as needed.        . simvastatin (ZOCOR) 20 MG tablet Take 1 tablet (20 mg total) by mouth at bedtime.  30 tablet  5  . torsemide (DEMADEX) 20 MG tablet Take 2 tablets (40 mg total) by mouth 2 (two) times daily.  120 tablet  3  . warfarin (COUMADIN) 2.5 MG tablet TAKE AS DIRECTED  40 tablet  3    Past Medical History  Diagnosis Date  . Coronary artery disease     a.  s/p CABG 2007;  b. cath 12/08: LM occluded, pRCA 80%, dRCA 50%, PLB 90%, S-PDA ok, S-Dx ok with occluded dist Dx; L-LAD ok, S-OM ok with 90% after graft insertion . . . . distal OM treated with DES  . Cardiomyopathy     echo 1/10: EF 45%, inf and post  HK, mild to mod LVH, mild AI, mod MR, LAE  . GERD (gastroesophageal reflux disease)   . Hypertension   . Atrial fibrillation   . Chronic renal insufficiency   . CVA (cerebral infarction)     left frontal  . Anemia     chronic  . Hypothyroidism   . Leg muscle spasm     Past Surgical History  Procedure Date  . Coronary artery bypass graft 2007    ROS  As stated in the HPI and negative for all other systems.   BP 110/61  Pulse 60  Resp 18  Ht 5\' 10"  (1.778 m)  Wt 173 lb 1.9 oz (78.527 kg)  BMI 24.84 kg/m2  PHYSICAL EXAM:  Frail-appearing HEENT:  Pupils equal round and reactive, fundi not visualized NECK:  No jugular venous distention, waveform within normal limits, carotid upstroke brisk and symmetric, no bruits, no thyromegaly LYMPHATICS:  No cervicalLUNGS:  Clear to auscultation bilaterally BACK:  No CVA tenderness CHEST:  Healed sternotomy scar HEART:  PMI not displaced or  sustained,,S1 and S2 within normal limits, no S3, no clicks, no rubs, no murmurs, irregluar ABD:  Flat, positive bowel sounds normal in frequency in pitch, no bruits, no rebound, no guarding, no midline pulsatile mass, no hepatomegaly, no splenomegaly EXT:  2 plus pulses throughout, mild edema, no cyanosis no clubbing, mild edema SKIN:  No rashes no nodules NEURO:  Cranial nerves II through XII grossly intact, motor grossly intact throughout [facility]:  Cognitively intact, oriented to person place and time, diminished hearing  EKG:  Atrial fibrillation, rate 70s, axis within normal limits, intervals within normal limits, no acute ST-T wave changes 06/26/2011  ASSESSMENT:

## 2011-06-26 NOTE — Assessment & Plan Note (Signed)
He has no symptoms.  I will stop his ASA.

## 2011-06-26 NOTE — Assessment & Plan Note (Signed)
He was due for warfarin check but I will draw an INR with the BMET.

## 2011-06-26 NOTE — Assessment & Plan Note (Signed)
He seems to be euvolemic.  At this point, no change in therapy is indicated.  We have reviewed salt and fluid restrictions.  No further cardiovascular testing is indicated.  I will check a BMET

## 2011-06-27 ENCOUNTER — Encounter: Payer: Medicare Other | Admitting: *Deleted

## 2011-06-28 ENCOUNTER — Other Ambulatory Visit (INDEPENDENT_AMBULATORY_CARE_PROVIDER_SITE_OTHER): Payer: Medicare Other

## 2011-06-28 ENCOUNTER — Encounter: Payer: Self-pay | Admitting: Internal Medicine

## 2011-06-28 ENCOUNTER — Ambulatory Visit (INDEPENDENT_AMBULATORY_CARE_PROVIDER_SITE_OTHER): Payer: Medicare Other | Admitting: Internal Medicine

## 2011-06-28 VITALS — BP 102/56 | HR 83 | Temp 97.6°F | Resp 16 | Wt 172.5 lb

## 2011-06-28 DIAGNOSIS — Z79899 Other long term (current) drug therapy: Secondary | ICD-10-CM

## 2011-06-28 DIAGNOSIS — R0602 Shortness of breath: Secondary | ICD-10-CM

## 2011-06-28 DIAGNOSIS — N189 Chronic kidney disease, unspecified: Secondary | ICD-10-CM

## 2011-06-28 DIAGNOSIS — R739 Hyperglycemia, unspecified: Secondary | ICD-10-CM

## 2011-06-28 DIAGNOSIS — I4891 Unspecified atrial fibrillation: Secondary | ICD-10-CM

## 2011-06-28 DIAGNOSIS — I10 Essential (primary) hypertension: Secondary | ICD-10-CM

## 2011-06-28 DIAGNOSIS — R7309 Other abnormal glucose: Secondary | ICD-10-CM

## 2011-06-28 DIAGNOSIS — I5022 Chronic systolic (congestive) heart failure: Secondary | ICD-10-CM

## 2011-06-28 DIAGNOSIS — E039 Hypothyroidism, unspecified: Secondary | ICD-10-CM

## 2011-06-28 DIAGNOSIS — D539 Nutritional anemia, unspecified: Secondary | ICD-10-CM

## 2011-06-28 DIAGNOSIS — I129 Hypertensive chronic kidney disease with stage 1 through stage 4 chronic kidney disease, or unspecified chronic kidney disease: Secondary | ICD-10-CM

## 2011-06-28 DIAGNOSIS — D649 Anemia, unspecified: Secondary | ICD-10-CM

## 2011-06-28 DIAGNOSIS — Z23 Encounter for immunization: Secondary | ICD-10-CM

## 2011-06-28 LAB — CBC WITH DIFFERENTIAL/PLATELET
Basophils Absolute: 0 10*3/uL (ref 0.0–0.1)
Eosinophils Absolute: 0.2 10*3/uL (ref 0.0–0.7)
HCT: 34.1 % — ABNORMAL LOW (ref 39.0–52.0)
Hemoglobin: 11.5 g/dL — ABNORMAL LOW (ref 13.0–17.0)
Lymphs Abs: 1.6 10*3/uL (ref 0.7–4.0)
MCHC: 33.7 g/dL (ref 30.0–36.0)
Monocytes Absolute: 0.5 10*3/uL (ref 0.1–1.0)
Monocytes Relative: 6.6 % (ref 3.0–12.0)
Neutro Abs: 5.5 10*3/uL (ref 1.4–7.7)
Platelets: 142 10*3/uL — ABNORMAL LOW (ref 150.0–400.0)
RDW: 15.2 % — ABNORMAL HIGH (ref 11.5–14.6)

## 2011-06-28 LAB — COMPREHENSIVE METABOLIC PANEL
ALT: 15 U/L (ref 0–53)
AST: 21 U/L (ref 0–37)
Alkaline Phosphatase: 39 U/L (ref 39–117)
CO2: 28 mEq/L (ref 19–32)
Creatinine, Ser: 2.8 mg/dL — ABNORMAL HIGH (ref 0.4–1.5)
GFR: 22.77 mL/min — ABNORMAL LOW (ref >60.00)
Sodium: 143 mEq/L (ref 135–145)
Total Bilirubin: 0.8 mg/dL (ref 0.3–1.2)
Total Protein: 7.4 g/dL (ref 6.0–8.3)

## 2011-06-28 LAB — URINALYSIS, ROUTINE W REFLEX MICROSCOPIC
Nitrite: NEGATIVE
Specific Gravity, Urine: 1.01 (ref 1.000–1.030)
Total Protein, Urine: NEGATIVE
Urine Glucose: NEGATIVE

## 2011-06-28 LAB — BASIC METABOLIC PANEL
BUN: 50 mg/dL — ABNORMAL HIGH (ref 6–23)
Creatinine, Ser: 2.9 mg/dL — ABNORMAL HIGH (ref 0.4–1.5)
GFR: 22.31 mL/min — ABNORMAL LOW (ref >60.00)
Potassium: 4.2 mEq/L (ref 3.5–5.1)

## 2011-06-28 LAB — BRAIN NATRIURETIC PEPTIDE: Pro B Natriuretic peptide (BNP): 859 pg/mL — ABNORMAL HIGH (ref 0.0–100.0)

## 2011-06-28 LAB — PROTIME-INR: Prothrombin Time: 25 s — ABNORMAL HIGH (ref 10.2–12.4)

## 2011-06-28 NOTE — Assessment & Plan Note (Signed)
I think that this is stable ACD and will check CBC today to see if there has been any change

## 2011-06-28 NOTE — Assessment & Plan Note (Signed)
I will check his TSH today 

## 2011-06-28 NOTE — Assessment & Plan Note (Addendum)
He will get a BMP and UA done today to see if his renal function has worsened or improved

## 2011-06-28 NOTE — Patient Instructions (Signed)

## 2011-06-28 NOTE — Assessment & Plan Note (Signed)
I will check his a1c and see if he needs medical therapy for DM

## 2011-06-28 NOTE — Progress Notes (Signed)
Subjective:    Patient ID: [name], male    DOB: 05/17/1920, 77 y.o.   MRN: [id number]  Thyroid Problem Presents for follow-up visit. Symptoms include leg swelling. Patient reports no anxiety, cold intolerance, constipation, depressed mood, diaphoresis, diarrhea, dry skin, fatigue, hair loss, heat intolerance, hoarse voice, nail problem, palpitations, tremors, visual change, weight gain or weight loss. The symptoms have been stable.      Review of Systems  Constitutional: Negative for fever, chills, weight loss, weight gain, diaphoresis, activity change, appetite change, fatigue and unexpected weight change.  HENT: Negative for hoarse voice.   Eyes: Negative.   Respiratory: Negative for apnea, cough, choking, chest tightness, shortness of breath, wheezing and stridor.   Cardiovascular: Negative for chest pain, palpitations and leg swelling.  Gastrointestinal: Negative for nausea, vomiting, abdominal pain, diarrhea and constipation.  Genitourinary: Negative for dysuria, urgency, frequency, hematuria, flank pain, decreased urine volume, enuresis and difficulty urinating.  Musculoskeletal: Negative for myalgias, back pain, joint swelling, arthralgias and gait problem.  Skin: Negative for color change, pallor, rash and wound.  Neurological: Negative for dizziness, tremors, seizures, syncope, facial asymmetry, speech difficulty, weakness, light-headedness, numbness and headaches.  Hematological: Negative for cold intolerance, heat intolerance and adenopathy. Does not bruise/bleed easily.  Psychiatric/Behavioral: Negative.        Objective:   Physical Exam  Constitutional: He is oriented to person, place, and time. He appears well-developed and well-nourished. No distress.  HENT:  Head: Normocephalic and atraumatic.  Mouth/Throat: Oropharynx is clear and moist. No oropharyngeal exudate.  Eyes: Conjunctivae are normal. Right eye exhibits no discharge. Left eye exhibits no discharge.  No scleral icterus.  Neck: Normal range of motion. Neck supple. No JVD present. No tracheal deviation present. No thyromegaly present.  Cardiovascular: An irregularly irregular rhythm present.  No extrasystoles are present. Exam reveals no gallop.   No murmur heard. Pulses:      Carotid pulses are 1+ on the right side, and 1+ on the left side.      Radial pulses are 1+ on the right side, and 1+ on the left side.       Femoral pulses are 1+ on the right side, and 1+ on the left side.      Popliteal pulses are 1+ on the right side, and 1+ on the left side.       Dorsalis pedis pulses are 1+ on the right side, and 1+ on the left side.       Posterior tibial pulses are 1+ on the right side, and 1+ on the left side.  Pulmonary/Chest: Effort normal and breath sounds normal. No stridor. No respiratory distress. He has no wheezes. He has no rales. He exhibits no tenderness.  Abdominal: Soft. Bowel sounds are normal. He exhibits no distension and no mass. There is no tenderness. There is no rebound and no guarding.  Musculoskeletal: Normal range of motion. He exhibits edema (trace edema in both legs). He exhibits no tenderness.  Lymphadenopathy:    He has no cervical adenopathy.  Neurological: He is oriented to person, place, and time. He displays normal reflexes. He exhibits normal muscle tone.  Skin: Skin is warm and dry. No rash noted. He is not diaphoretic. No erythema. No pallor.  Psychiatric: He has a normal mood and affect. Judgment and thought content normal. His speech is delayed and tangential. His speech is not rapid and/or pressured and not slurred. He is slowed and withdrawn. He is not agitated, not aggressive, is not  hyperactive, not actively hallucinating and not combative. Cognition and memory are not impaired. He does not express impulsivity or inappropriate judgment. He is communicative. He exhibits normal recent memory and normal remote memory. He is inattentive.      Lab Results    Component Value Date   WBC 6.4 02/22/2011   HGB 10.8* 02/22/2011   HCT 32.0* 02/22/2011   PLT 113.0* 02/22/2011   GLUCOSE 147* 02/22/2011   CHOL  Value: 84        ATP III CLASSIFICATION:  <200     mg/dL   Desirable  161-096  mg/dL   Borderline High  >=045    mg/dL   High 40/98/1191   TRIG 38 08/24/2007   HDL 21* 08/24/2007   LDLCALC  Value: 55        Total Cholesterol/HDL:CHD Risk Coronary Heart Disease Risk Table                     Men   Women  1/2 Average Risk   3.4   3.3 08/24/2007   ALT 15 01/15/2011   AST 18 01/15/2011   NA 138 02/22/2011   K 4.3 02/22/2011   CL 104 02/22/2011   CREATININE 2.5* 02/22/2011   BUN 38* 02/22/2011   CO2 27 02/22/2011   TSH 2.35 01/15/2011   INR 2.3 05/30/2011   HGBA1C 6.7* 01/15/2011      Assessment & Plan:

## 2011-06-28 NOTE — Assessment & Plan Note (Signed)
His BP is well controlled, I will check his lytes and renal function 

## 2011-06-29 ENCOUNTER — Ambulatory Visit (INDEPENDENT_AMBULATORY_CARE_PROVIDER_SITE_OTHER): Payer: Self-pay | Admitting: Cardiovascular Disease

## 2011-06-29 ENCOUNTER — Encounter: Payer: Self-pay | Admitting: Internal Medicine

## 2011-06-29 DIAGNOSIS — Z7901 Long term (current) use of anticoagulants: Secondary | ICD-10-CM

## 2011-06-29 DIAGNOSIS — Z8679 Personal history of other diseases of the circulatory system: Secondary | ICD-10-CM

## 2011-06-29 DIAGNOSIS — I4891 Unspecified atrial fibrillation: Secondary | ICD-10-CM

## 2011-06-29 DIAGNOSIS — R0989 Other specified symptoms and signs involving the circulatory and respiratory systems: Secondary | ICD-10-CM

## 2011-07-02 ENCOUNTER — Telehealth: Payer: Self-pay | Admitting: Cardiology

## 2011-07-02 NOTE — Telephone Encounter (Signed)
Patient is aware that the BMET lab work needs to done in one month.

## 2011-07-02 NOTE — Telephone Encounter (Signed)
New message- pt's wife called and wanted to know if he should have blood work done at Dr. Yetta Barre office this week  Please call

## 2011-07-05 ENCOUNTER — Other Ambulatory Visit: Payer: Self-pay | Admitting: Cardiology

## 2011-07-06 ENCOUNTER — Emergency Department (HOSPITAL_COMMUNITY)
Admission: EM | Admit: 2011-07-06 | Discharge: 2011-07-07 | Disposition: A | Discharge status: Home / Self Care | Payer: Medicare Other | Attending: Emergency Medicine | Admitting: Emergency Medicine

## 2011-07-06 ENCOUNTER — Encounter (HOSPITAL_COMMUNITY): Payer: Self-pay | Admitting: Emergency Medicine

## 2011-07-06 ENCOUNTER — Telehealth: Payer: Self-pay | Admitting: *Deleted

## 2011-07-06 ENCOUNTER — Other Ambulatory Visit: Payer: Self-pay | Admitting: Cardiology

## 2011-07-06 ENCOUNTER — Emergency Department (HOSPITAL_COMMUNITY): Payer: Medicare Other

## 2011-07-06 DIAGNOSIS — I2510 Atherosclerotic heart disease of native coronary artery without angina pectoris: Secondary | ICD-10-CM | POA: Insufficient documentation

## 2011-07-06 DIAGNOSIS — E039 Hypothyroidism, unspecified: Secondary | ICD-10-CM | POA: Insufficient documentation

## 2011-07-06 DIAGNOSIS — N189 Chronic kidney disease, unspecified: Secondary | ICD-10-CM | POA: Insufficient documentation

## 2011-07-06 DIAGNOSIS — I219 Acute myocardial infarction, unspecified: Secondary | ICD-10-CM | POA: Insufficient documentation

## 2011-07-06 DIAGNOSIS — Z9861 Coronary angioplasty status: Secondary | ICD-10-CM | POA: Insufficient documentation

## 2011-07-06 DIAGNOSIS — I252 Old myocardial infarction: Secondary | ICD-10-CM | POA: Insufficient documentation

## 2011-07-06 DIAGNOSIS — Z8673 Personal history of transient ischemic attack (TIA), and cerebral infarction without residual deficits: Secondary | ICD-10-CM | POA: Insufficient documentation

## 2011-07-06 DIAGNOSIS — I4891 Unspecified atrial fibrillation: Secondary | ICD-10-CM | POA: Insufficient documentation

## 2011-07-06 DIAGNOSIS — Z79899 Other long term (current) drug therapy: Secondary | ICD-10-CM | POA: Insufficient documentation

## 2011-07-06 DIAGNOSIS — K219 Gastro-esophageal reflux disease without esophagitis: Secondary | ICD-10-CM | POA: Insufficient documentation

## 2011-07-06 DIAGNOSIS — I129 Hypertensive chronic kidney disease with stage 1 through stage 4 chronic kidney disease, or unspecified chronic kidney disease: Secondary | ICD-10-CM | POA: Insufficient documentation

## 2011-07-06 DIAGNOSIS — R51 Headache: Secondary | ICD-10-CM

## 2011-07-06 DIAGNOSIS — Z951 Presence of aortocoronary bypass graft: Secondary | ICD-10-CM | POA: Insufficient documentation

## 2011-07-06 DIAGNOSIS — Z7901 Long term (current) use of anticoagulants: Secondary | ICD-10-CM | POA: Insufficient documentation

## 2011-07-06 LAB — PROTIME-INR: Prothrombin Time: 27.3 seconds — ABNORMAL HIGH (ref 11.6–15.2)

## 2011-07-06 NOTE — ED Notes (Signed)
Pt reports he awoke from sleep last night with a severe dull ache behind right eye that lasted a few minutes and pt was then able to sleep, was able to go out shopping and do regular activities with no problems today. Denies pain, but reports "head feels stopped up or something." Wife reports no changes in behavior or abilities.

## 2011-07-06 NOTE — Telephone Encounter (Signed)
FYI: Patient walk-in to office [4:30pm] stating he was awaken last night with pain in back of head and was afraid he may have had a stroke [did not tell wife about the incident until this afternoon]. Tested Pt with 'F.A.S.T' for warning signs [no numbness or weakness reported, no trouble speaking or understanding, no trouble with vision, no trouble with walking and/or balance ([only HA with no known cause)]. Patient has Hx of Heart disease; sent to [facility] ED for assessment  & evaluation.

## 2011-07-06 NOTE — ED Notes (Signed)
Patient had labs done and ct.

## 2011-07-06 NOTE — ED Provider Notes (Signed)
History     CSN: [id number] Arrival date & time: 07/06/2011  4:44 PM   First MD Initiated Contact with Patient 07/06/11 1916      Chief Complaint  Patient presents with  . Headache    from last night    (Consider location/radiation/quality/duration/timing/severity/associated sxs/prior treatment) HPI Complains of right-sided temporal headache upon awakening this morning. Pain nonradiating treated himself with his own medication (either HEENT recall his wife recall the medication taken for pain) headache is now improved and almost gone pain mild at present tell in nature no other associated symptoms no visual change no nausea no vomiting Past Medical History  Diagnosis Date  . Coronary artery disease     a.  s/p CABG 2007;  b. cath 12/08: LM occluded, pRCA 80%, dRCA 50%, PLB 90%, S-PDA ok, S-Dx ok with occluded dist Dx; L-LAD ok, S-OM ok with 90% after graft insertion . . . . distal OM treated with DES  . Cardiomyopathy     echo 1/10: EF 45%, inf and post HK, mild to mod LVH, mild AI, mod MR, LAE  . GERD (gastroesophageal reflux disease)   . Hypertension   . Atrial fibrillation   . Chronic renal insufficiency   . CVA (cerebral infarction)     left frontal  . Anemia     chronic  . Hypothyroidism   . Leg muscle spasm   . MI (myocardial infarction)     Past Surgical History  Procedure Date  . Coronary artery bypass graft 2007  . Coronary angioplasty with stent placement     Family History  Problem Relation Age of Onset  . Cancer Mother   . Stroke Father     History  Substance Use Topics  . Smoking status: Never Smoker   . Smokeless tobacco: Not on file  . Alcohol Use: No      Review of Systems  Constitutional: Negative.   HENT: Negative.   Respiratory: Negative.   Cardiovascular: Negative.   Gastrointestinal: Negative.   Musculoskeletal: Negative.   Skin: Negative.   Neurological:       Headache  Hematological: Negative.   Psychiatric/Behavioral:  Negative.   All other systems reviewed and are negative.    Allergies  Ace inhibitors  Home Medications   Current Outpatient Rx  Name Route Sig Dispense Refill  . CARVEDILOL 6.25 MG PO TABS  TAKE ONE (1) TABLET(S) TWICE DAILY 60 tablet 6  . FENOFIBRATE 54 MG PO TABS  TAKE ONE (1) TABLET(S) ONCE DAILY 30 tablet 6  . FERROUS SULFATE 325 (65 FE) MG PO TABS Oral Take 325 mg by mouth 2 (two) times daily.     Marland Kitchen FINASTERIDE 5 MG PO TABS Oral Take 1 tablet (5 mg total) by mouth daily. 30 tablet 11  . LEVOTHYROXINE SODIUM 100 MCG PO TABS Oral Take 1 tablet (100 mcg total) by mouth daily. 30 tablet 11  . NITROSTAT 0.4 MG SL SUBL  DISSOLVE ONE (1) TABLET UNDER THE TONGUE EVERY 5 MINS AS NEEDED FOR CHEST PAIN--MAY REPEAT 3 TIME S 25 each 12  . SIMVASTATIN 20 MG PO TABS Oral Take 1 tablet (20 mg total) by mouth at bedtime. 30 tablet 5  . TORSEMIDE 20 MG PO TABS Oral Take 20-30 mg by mouth 2 (two) times daily. 1.5 tab in am, 1 tab pm     . WARFARIN SODIUM 2.5 MG PO TABS Oral Take 2.5-5 mg by mouth daily. 1 tab daily except 2 tab on Monday  BP 147/66  Pulse 82  Temp(Src) 97.4 F (36.3 C) (Oral)  Resp 20  Ht 5\' 10"  (1.778 m)  Wt 172 lb (78.019 kg)  BMI 24.68 kg/m2  SpO2 99%  Physical Exam  Nursing note and vitals reviewed. Constitutional: He is oriented to person, place, and time. He appears well-developed and well-nourished. No distress.  HENT:  Head: Normocephalic and atraumatic.  Eyes: Conjunctivae are normal. Pupils are equal, round, and reactive to light.  Neck: Neck supple. No tracheal deviation present. No thyromegaly present.  Cardiovascular: Normal rate.   No murmur heard.      Irregularly irregular  Pulmonary/Chest: Effort normal and breath sounds normal.  Abdominal: Soft. Bowel sounds are normal. He exhibits no distension. There is no tenderness.  Musculoskeletal: Normal range of motion. He exhibits no edema and no tenderness.  Neurological: He is alert and oriented to  person, place, and time. Coordination normal.       Gait normal , pronator drift normal  Skin: Skin is warm and dry. No rash noted.  Psychiatric: He has a normal mood and affect.    ED Course  Procedures (including critical care time)   Labs Reviewed  PROTIME-INR  SEDIMENTATION RATE   Ct Head Wo Contrast  07/06/2011  *RADIOLOGY REPORT*  Clinical Data: Headache.  CT HEAD WITHOUT CONTRAST  Technique:  Contiguous axial images were obtained from the base of the skull through the vertex without contrast.  Comparison: Head CT 10/08/2006.  Findings: There is age related cerebral atrophy, ventriculomegaly and periventricular white matter disease.  There is a remote watershed type infarct in the left frontoparietal area.  No extra- axial fluid collections are seen.  No CT findings for acute hemispheric infarction and/or intracranial hemorrhage.  No mass lesions.  The brainstem and cerebellum are grossly normal and stable.  The calvarium is intact.  The paranasal sinuses and mastoid air cells are clear.  IMPRESSION:  1.  Stable age related cerebral atrophy, ventriculomegaly and periventricular white matter disease.  Remote left frontal parietal infarct is again noted. 2.  No acute intracranial findings or mass lesion.  Original Report Authenticated By: P. Loralie Champagne, M.D.     No diagnosis found. 11 PM patient resting comfortably alert Glasgow Coma Score 15   MDM  Headache is nonspecific, mild on discharge and not of serious etiology. Diagnosis nonspecific headache        Doug Sou, MD 07/06/11 [phone]

## 2011-07-06 NOTE — ED Notes (Signed)
Pt states that at 3 am this morning he woke up with a severe headache in the right side of his head.  It did not last long.  He has no neuro deficits at this time.

## 2011-07-06 NOTE — ED Notes (Signed)
Patient stable upon discharge and states understanding of paperwork.  Patient being discharged home with family.

## 2011-07-25 ENCOUNTER — Other Ambulatory Visit: Payer: Self-pay | Admitting: Cardiology

## 2011-08-09 ENCOUNTER — Ambulatory Visit (INDEPENDENT_AMBULATORY_CARE_PROVIDER_SITE_OTHER): Payer: Medicare Other | Admitting: *Deleted

## 2011-08-09 DIAGNOSIS — Z8679 Personal history of other diseases of the circulatory system: Secondary | ICD-10-CM

## 2011-08-09 DIAGNOSIS — Z7901 Long term (current) use of anticoagulants: Secondary | ICD-10-CM

## 2011-08-09 DIAGNOSIS — I4891 Unspecified atrial fibrillation: Secondary | ICD-10-CM

## 2011-09-02 ENCOUNTER — Other Ambulatory Visit: Payer: Self-pay | Admitting: Cardiology

## 2011-09-07 ENCOUNTER — Other Ambulatory Visit: Payer: Self-pay | Admitting: Cardiology

## 2011-09-20 ENCOUNTER — Encounter: Payer: Medicare Other | Admitting: *Deleted

## 2011-09-20 ENCOUNTER — Ambulatory Visit (INDEPENDENT_AMBULATORY_CARE_PROVIDER_SITE_OTHER): Payer: Medicare Other

## 2011-09-20 DIAGNOSIS — Z8679 Personal history of other diseases of the circulatory system: Secondary | ICD-10-CM

## 2011-09-20 DIAGNOSIS — I4891 Unspecified atrial fibrillation: Secondary | ICD-10-CM

## 2011-09-20 DIAGNOSIS — Z7901 Long term (current) use of anticoagulants: Secondary | ICD-10-CM

## 2011-09-20 LAB — POCT INR: INR: 2.3

## 2011-09-25 ENCOUNTER — Encounter: Payer: Medicare Other | Admitting: *Deleted

## 2011-09-30 ENCOUNTER — Other Ambulatory Visit: Payer: Self-pay | Admitting: Internal Medicine

## 2011-09-30 ENCOUNTER — Other Ambulatory Visit: Payer: Self-pay | Admitting: Cardiology

## 2011-10-16 ENCOUNTER — Telehealth: Payer: Self-pay | Admitting: Cardiology

## 2011-10-16 NOTE — Telephone Encounter (Signed)
Received call from daughter Julieta Bellini requesting status of FMLA paperwork. Form not signed-Dr. Antoine Poche will not be returning until Monday 2/25. Completion time outside of 7-10 business day period. We rcvd form, ROI and payment 10/01/11. Called [employer] / Kathrynn Running at [phone] to inform her of our neglect concerning completion. Upon completion on 2/25 we will fax to [phone] and call Angelique Blonder to make her aware. Called Miss Paige-unable to reach her, left voicemail after second attempt. rmf 4:55pm-10/16/11.

## 2011-10-17 ENCOUNTER — Other Ambulatory Visit: Payer: Self-pay | Admitting: Internal Medicine

## 2011-10-17 ENCOUNTER — Other Ambulatory Visit: Payer: Self-pay | Admitting: Cardiology

## 2011-10-17 NOTE — Telephone Encounter (Signed)
Done

## 2011-10-22 ENCOUNTER — Ambulatory Visit (INDEPENDENT_AMBULATORY_CARE_PROVIDER_SITE_OTHER): Payer: Medicare Other | Admitting: Cardiology

## 2011-10-22 ENCOUNTER — Ambulatory Visit (INDEPENDENT_AMBULATORY_CARE_PROVIDER_SITE_OTHER): Payer: Medicare Other | Admitting: Pharmacist

## 2011-10-22 ENCOUNTER — Encounter: Payer: Self-pay | Admitting: Cardiology

## 2011-10-22 DIAGNOSIS — N189 Chronic kidney disease, unspecified: Secondary | ICD-10-CM

## 2011-10-22 DIAGNOSIS — I2510 Atherosclerotic heart disease of native coronary artery without angina pectoris: Secondary | ICD-10-CM

## 2011-10-22 DIAGNOSIS — Z8679 Personal history of other diseases of the circulatory system: Secondary | ICD-10-CM

## 2011-10-22 DIAGNOSIS — Z7901 Long term (current) use of anticoagulants: Secondary | ICD-10-CM

## 2011-10-22 DIAGNOSIS — I4891 Unspecified atrial fibrillation: Secondary | ICD-10-CM

## 2011-10-22 DIAGNOSIS — I2589 Other forms of chronic ischemic heart disease: Secondary | ICD-10-CM

## 2011-10-22 DIAGNOSIS — I10 Essential (primary) hypertension: Secondary | ICD-10-CM

## 2011-10-22 DIAGNOSIS — N289 Disorder of kidney and ureter, unspecified: Secondary | ICD-10-CM

## 2011-10-22 LAB — POCT INR: INR: 1.9

## 2011-10-22 NOTE — Assessment & Plan Note (Signed)
The blood pressure is at target. No change in medications is indicated. We will continue with therapeutic lifestyle changes (TLC).  

## 2011-10-22 NOTE — Patient Instructions (Signed)
Please have blood work today  Follow up in 4 months with Dr Hohenwald Lions

## 2011-10-22 NOTE — Progress Notes (Signed)
HPI The patient presents for follow up of the his known cardiomyopathy.  Since I last saw him he has done relatively well.  He has had no new complaints. He denies any new shortness of breath, PND or orthopnea. He has had no new palpitations, presyncope or syncope. He's had some mild chronic lower extremity swelling. He denies any chest pressure, neck or arm discomfort.  Allergies  Allergen Reactions  . Ace Inhibitors     Current Outpatient Prescriptions on File Prior to Visit  Medication Sig Dispense Refill  . carvedilol (COREG) 6.25 MG tablet TAKE ONE (1) TABLET(S) TWICE DAILY  60 tablet  5  . fenofibrate 54 MG tablet TAKE ONE (1) TABLET(S) ONCE DAILY  30 tablet  6  . ferrous sulfate 325 (65 FE) MG tablet Take 325 mg by mouth 2 (two) times daily.       . finasteride (PROSCAR) 5 MG tablet Take 1 tablet (5 mg total) by mouth daily.  30 tablet  11  . levothyroxine (SYNTHROID) 100 MCG tablet Take 1 tablet (100 mcg total) by mouth daily.  30 tablet  11  . NITROSTAT 0.4 MG SL tablet DISSOLVE ONE (1) TABLET UNDER THE TONGUE EVERY 5 MINS AS NEEDED FOR CHEST PAIN--MAY REPEAT 3 TIME S  25 each  12  . simvastatin (ZOCOR) 20 MG tablet TAKE ONE TABLET BY MOUTH AT BEDTIME  30 tablet  0  . simvastatin (ZOCOR) 20 MG tablet TAKE ONE TABLET BY MOUTH AT BEDTIME  30 tablet  3  . torsemide (DEMADEX) 20 MG tablet Take 20-30 mg by mouth 2 (two) times daily. 1.5 tab in am, 1 tab pm       . torsemide (DEMADEX) 20 MG tablet Take 2.5 tablets (50 mg total) by mouth daily.  75 tablet  6  . warfarin (COUMADIN) 2.5 MG tablet Take 2.5-5 mg by mouth daily. 1 tab daily except 2 tab on Monday       . warfarin (COUMADIN) 2.5 MG tablet TAKE AS DIRECTED  40 tablet  3    Past Medical History  Diagnosis Date  . Coronary artery disease     a.  s/p CABG 2007;  b. cath 12/08: LM occluded, pRCA 80%, dRCA 50%, PLB 90%, S-PDA ok, S-Dx ok with occluded dist Dx; L-LAD ok, S-OM ok with 90% after graft insertion . . . . distal OM  treated with DES  . Cardiomyopathy     echo 6/12 EF 40%  . GERD (gastroesophageal reflux disease)   . Hypertension   . Atrial fibrillation   . Chronic renal insufficiency   . CVA (cerebral infarction)     left frontal  . Anemia     chronic  . Hypothyroidism   . Leg muscle spasm   . MI (myocardial infarction)     Past Surgical History  Procedure Date  . Coronary artery bypass graft 2007  . Coronary angioplasty with stent placement     ROS  As stated in the HPI and negative for all other systems.   BP 140/80  Pulse 77  Ht 5\' 11"  (1.803 m)  Wt 174 lb (78.926 kg)  BMI 24.27 kg/m2  PHYSICAL EXAM:  Frail-appearing BP 140/80  Pulse 77  Ht 5\' 11"  (1.803 m)  Wt 174 lb (78.926 kg)  BMI 24.27 kg/m2 HEENT:  Pupils equal round and reactive, fundi not visualized NECK: Jugular venous distention of 6 cm at 40 degrees, waveform within normal limits, carotid upstroke brisk and symmetric,  no bruits, no thyromegaly LYMPHATICS:  No cervical LUNGS:  Clear to auscultation bilaterally BACK:  No CVA tenderness CHEST:  Healed sternotomy scar HEART:  PMI not displaced or sustained,,S1 and S2 within normal limits, no S3, no clicks, no rubs, no murmurs, irregluar ABD:  Flat, positive bowel sounds normal in frequency in pitch, no bruits, no rebound, no guarding, no midline pulsatile mass, no hepatomegaly, no splenomegaly EXT:  2 plus pulses throughout, mild edema, no cyanosis no clubbing, mild edema SKIN:  No rashes no nodules NEURO:  Cranial nerves II through XII grossly intact, motor grossly intact throughout PSYCH:  Cognitively intact, oriented to person place and time, diminished hearing  EKG:  Atrial fibrillation, rate 85, QTC slightly prolonged, nonspecific lateral T-wave changes.  10/22/2011  ASSESSMENT:

## 2011-10-22 NOTE — Assessment & Plan Note (Signed)
He seems to be euvolemic.  At this point, no change in therapy is indicated.  We have reviewed salt and fluid restrictions.  No further cardiovascular testing is indicated.  I will check a BMET today.

## 2011-10-22 NOTE — Assessment & Plan Note (Signed)
I will check a BMET today. 

## 2011-10-22 NOTE — Assessment & Plan Note (Signed)
The patient  tolerates this rhythm and rate control and anticoagulation. We will continue with the meds as listed.  

## 2011-10-23 LAB — BASIC METABOLIC PANEL WITH GFR
BUN: 39 mg/dL — ABNORMAL HIGH (ref 6–23)
CO2: 28 meq/L (ref 19–32)
Calcium: 9.2 mg/dL (ref 8.4–10.5)
Chloride: 108 meq/L (ref 96–112)
Creatinine, Ser: 2.5 mg/dL — ABNORMAL HIGH (ref 0.4–1.5)
GFR: 26.18 mL/min — ABNORMAL LOW
Glucose, Bld: 92 mg/dL (ref 70–99)
Potassium: 4.6 meq/L (ref 3.5–5.1)
Sodium: 142 meq/L (ref 135–145)

## 2011-11-08 ENCOUNTER — Telehealth: Payer: Self-pay | Admitting: Cardiology

## 2011-11-08 NOTE — Telephone Encounter (Signed)
Pt uses [facility], coughing a lot, wife requesting rx cough med because she's afraid the coughing will effect his heart, pls call  [phone]

## 2011-11-09 ENCOUNTER — Ambulatory Visit (INDEPENDENT_AMBULATORY_CARE_PROVIDER_SITE_OTHER)
Admission: RE | Admit: 2011-11-09 | Discharge: 2011-11-09 | Disposition: A | Discharge status: Home / Self Care | Payer: Medicare Other | Source: Ambulatory Visit | Attending: Internal Medicine | Admitting: Internal Medicine

## 2011-11-09 ENCOUNTER — Ambulatory Visit (INDEPENDENT_AMBULATORY_CARE_PROVIDER_SITE_OTHER): Payer: Medicare Other | Admitting: Internal Medicine

## 2011-11-09 ENCOUNTER — Encounter: Payer: Self-pay | Admitting: Internal Medicine

## 2011-11-09 VITALS — BP 112/62 | HR 76 | Temp 98.0°F | Resp 16 | Wt 171.0 lb

## 2011-11-09 DIAGNOSIS — R059 Cough, unspecified: Secondary | ICD-10-CM

## 2011-11-09 DIAGNOSIS — R05 Cough: Secondary | ICD-10-CM

## 2011-11-09 DIAGNOSIS — J209 Acute bronchitis, unspecified: Secondary | ICD-10-CM

## 2011-11-09 DIAGNOSIS — I10 Essential (primary) hypertension: Secondary | ICD-10-CM

## 2011-11-09 MED ORDER — AZITHROMYCIN 500 MG PO TABS: 500.0000 mg | ORAL_TABLET | Freq: Every day | ORAL | Status: AC

## 2011-11-09 MED ORDER — GUAIFENESIN-CODEINE 100-10 MG/5ML PO SYRP
5.0000 mL | ORAL_SOLUTION | Freq: Three times a day (TID) | ORAL | Status: AC | PRN
Start: 2011-11-09 — End: 2011-11-19

## 2011-11-09 NOTE — Assessment & Plan Note (Signed)
His BP is well controlled 

## 2011-11-09 NOTE — Assessment & Plan Note (Signed)
Start zpak and a cough suppressant

## 2011-11-09 NOTE — Progress Notes (Signed)
Subjective:    Patient ID: [name], male    DOB: [birth year], 78 y.o.   MRN: [id number]  Cough This is a new problem. The current episode started in the past 7 days. The problem has been unchanged. The problem occurs every few hours. The cough is productive of purulent sputum. Associated symptoms include chills, rhinorrhea, a sore throat and sweats. Pertinent negatives include no chest pain, ear congestion, ear pain, fever, headaches, heartburn, hemoptysis, myalgias, nasal congestion, postnasal drip, rash, shortness of breath, weight loss or wheezing. The symptoms are aggravated by nothing. He has tried OTC cough suppressant for the symptoms. The treatment provided mild relief.      Review of Systems  Constitutional: Positive for chills. Negative for fever, weight loss, diaphoresis, activity change, appetite change, fatigue and unexpected weight change.  HENT: Positive for sore throat and rhinorrhea. Negative for ear pain and postnasal drip.   Eyes: Negative.   Respiratory: Positive for cough. Negative for hemoptysis, chest tightness, shortness of breath, wheezing and stridor.   Cardiovascular: Negative for chest pain, palpitations and leg swelling.  Gastrointestinal: Negative for heartburn, nausea, vomiting, abdominal pain, diarrhea, constipation and anal bleeding.  Genitourinary: Negative.   Musculoskeletal: Negative for myalgias, back pain, joint swelling, arthralgias and gait problem.  Skin: Negative for color change, pallor, rash and wound.  Neurological: Negative.  Negative for dizziness, tremors, seizures, syncope, facial asymmetry, speech difficulty, weakness, light-headedness, numbness and headaches.  Hematological: Negative for adenopathy. Does not bruise/bleed easily.  Psychiatric/Behavioral: Negative.        Objective:   Physical Exam  Vitals reviewed. Constitutional: He is oriented to person, place, and time. He appears well-developed and well-nourished. No distress.    HENT:  Head: Normocephalic and atraumatic.  Mouth/Throat: Oropharynx is clear and moist. No oropharyngeal exudate.  Eyes: Conjunctivae are normal. Right eye exhibits no discharge. Left eye exhibits no discharge. No scleral icterus.  Neck: Normal range of motion. Neck supple. No JVD present. No tracheal deviation present. No thyromegaly present.  Cardiovascular: Normal rate, regular rhythm, normal heart sounds and intact distal pulses.  Exam reveals no gallop and no friction rub.   No murmur heard. Pulmonary/Chest: Effort normal. No accessory muscle usage or stridor. Not tachypneic. No respiratory distress. He has no decreased breath sounds. He has no wheezes. He has no rhonchi. He has rales in the right lower field and the left lower field. He exhibits no tenderness.  Abdominal: Soft. Bowel sounds are normal. He exhibits no distension and no mass. There is no tenderness. There is no rebound and no guarding.  Musculoskeletal: Normal range of motion. He exhibits no edema and no tenderness.  Lymphadenopathy:    He has no cervical adenopathy.  Neurological: He is oriented to person, place, and time.  Skin: Skin is warm and dry. No rash noted. He is not diaphoretic. No erythema. No pallor.  Psychiatric: He has a normal mood and affect. His behavior is normal. Judgment and thought content normal.      Lab Results  Component Value Date   WBC 7.9 06/28/2011   HGB 11.5* 06/28/2011   HCT 34.1* 06/28/2011   PLT 142.0* 06/28/2011   GLUCOSE 92 10/22/2011   CHOL  Value: 84        ATP III CLASSIFICATION:  <200     mg/dL   Desirable  425-956  mg/dL   Borderline High  >=387    mg/dL   High 56/43/3295   TRIG 38 08/24/2007   HDL 21*  08/24/2007   LDLCALC  Value: 55        Total Cholesterol/HDL:CHD Risk Coronary Heart Disease Risk Table                     Men   Women  1/2 Average Risk   3.4   3.3 08/24/2007   ALT 15 06/28/2011   AST 21 06/28/2011   NA 142 10/22/2011   K 4.6 10/22/2011   CL 108 10/22/2011    CREATININE 2.5* 10/22/2011   BUN 39* 10/22/2011   CO2 28 10/22/2011   TSH 0.44 06/28/2011   INR 1.9 10/22/2011   HGBA1C 6.5 06/28/2011       Assessment & Plan:

## 2011-11-09 NOTE — Telephone Encounter (Signed)
Pt has been seen by Dr Yetta Barre.  Home number is busy.  Will continue to attempt to contact pt.

## 2011-11-09 NOTE — Assessment & Plan Note (Signed)
I will check a CXR today to look for pna, mass, edema 

## 2011-11-09 NOTE — Patient Instructions (Signed)

## 2011-11-09 NOTE — Telephone Encounter (Signed)
Spoke with wife who is aware that I was not in the office yesterday to return their phone call,   I was very pleased that the pt had seen his PCP and received treatment for his acute bronchitis.  I apologized again for them not receiving a phone call back yesterday.

## 2011-12-04 ENCOUNTER — Telehealth: Payer: Self-pay | Admitting: Cardiology

## 2011-12-04 ENCOUNTER — Encounter: Payer: Self-pay | Admitting: *Deleted

## 2011-12-04 NOTE — Telephone Encounter (Signed)
Pt needs a letter stating that his health condition does not allow him to take classes away from home at this time.  Wife aware I will complete the note and leave it at the front desk for her.

## 2011-12-04 NOTE — Telephone Encounter (Signed)
New msg Pt's wife called about letter that she needs saying he not physically able to take some courses. Please call

## 2011-12-05 ENCOUNTER — Ambulatory Visit (INDEPENDENT_AMBULATORY_CARE_PROVIDER_SITE_OTHER): Payer: Medicare Other | Admitting: *Deleted

## 2011-12-05 DIAGNOSIS — Z7901 Long term (current) use of anticoagulants: Secondary | ICD-10-CM

## 2011-12-05 DIAGNOSIS — Z8679 Personal history of other diseases of the circulatory system: Secondary | ICD-10-CM

## 2011-12-05 DIAGNOSIS — I4891 Unspecified atrial fibrillation: Secondary | ICD-10-CM

## 2011-12-05 LAB — POCT INR: INR: 2.2

## 2011-12-13 ENCOUNTER — Telehealth: Payer: Self-pay | Admitting: *Deleted

## 2011-12-13 NOTE — Telephone Encounter (Signed)
Caller reports that patient has "stopped up ear"-needs OV. Patient went to [facility], had ear irrigation.

## 2011-12-17 ENCOUNTER — Telehealth: Payer: Self-pay | Admitting: Cardiology

## 2011-12-17 NOTE — Telephone Encounter (Signed)
Per wife call - states that the company that needed the letter stating the pt could not longer travel from home now wanted a notarized letter.  Explained to pt that this is not a normal procedure for [place] and that I need to speak with someone from the company.  Called and left a message for Duran from [employer] to call back to discuss [phone].

## 2011-12-17 NOTE — Telephone Encounter (Signed)
Per Elise Benne, its a  "[place] stipulation" that this letter be notarized so that he can turn it in.  Left message for our medical records to look into obtaining this.

## 2011-12-17 NOTE — Telephone Encounter (Signed)
New msg Pt wants to talk to you about letter that was sent about courses that he was taking. Please call her back

## 2011-12-25 ENCOUNTER — Telehealth: Payer: Self-pay | Admitting: Cardiology

## 2011-12-25 NOTE — Telephone Encounter (Signed)
Pt's wife calling re insurance policy

## 2011-12-25 NOTE — Telephone Encounter (Signed)
Wife aware that letter needs to be notarized then mailed to the insurance company before they will accept it.  Aware that it will be done and mailed to them ASAP.  She stated understanding

## 2011-12-25 NOTE — Telephone Encounter (Signed)
Will forward to [facility] so that letter that is in the system can be notorized and mailed

## 2012-01-01 NOTE — Telephone Encounter (Signed)
Completed, Rene Kocher will mail for pt

## 2012-01-01 NOTE — Telephone Encounter (Signed)
Notarized letter completed and mailed to patient per his request. rmf 12/02/11

## 2012-01-02 ENCOUNTER — Other Ambulatory Visit: Payer: Self-pay

## 2012-01-02 MED ORDER — FENOFIBRATE 54 MG PO TABS: 54.0000 mg | ORAL_TABLET | Freq: Every day | ORAL | Status: DC

## 2012-01-02 NOTE — Telephone Encounter (Signed)
..   Requested Prescriptions   Signed Prescriptions Disp Refills  . fenofibrate 54 MG tablet 30 tablet 6    Sig: Take 1 tablet (54 mg total) by mouth daily.    Authorizing Provider: Rollene Rotunda    Ordering User: Christella Hartigan, Inis Borneman Judie Petit

## 2012-01-14 ENCOUNTER — Ambulatory Visit (INDEPENDENT_AMBULATORY_CARE_PROVIDER_SITE_OTHER): Payer: Medicare Other | Admitting: Pharmacist

## 2012-01-14 DIAGNOSIS — I4891 Unspecified atrial fibrillation: Secondary | ICD-10-CM

## 2012-01-14 DIAGNOSIS — Z7901 Long term (current) use of anticoagulants: Secondary | ICD-10-CM

## 2012-01-14 DIAGNOSIS — Z8679 Personal history of other diseases of the circulatory system: Secondary | ICD-10-CM

## 2012-02-01 ENCOUNTER — Other Ambulatory Visit: Payer: Self-pay | Admitting: Internal Medicine

## 2012-02-19 ENCOUNTER — Ambulatory Visit (INDEPENDENT_AMBULATORY_CARE_PROVIDER_SITE_OTHER): Payer: Medicare Other | Admitting: Cardiology

## 2012-02-19 ENCOUNTER — Ambulatory Visit (INDEPENDENT_AMBULATORY_CARE_PROVIDER_SITE_OTHER): Payer: Medicare Other | Admitting: *Deleted

## 2012-02-19 ENCOUNTER — Encounter: Payer: Self-pay | Admitting: Cardiology

## 2012-02-19 VITALS — BP 132/62 | HR 91 | Ht 71.0 in | Wt 170.0 lb

## 2012-02-19 DIAGNOSIS — I4891 Unspecified atrial fibrillation: Secondary | ICD-10-CM

## 2012-02-19 DIAGNOSIS — Z8679 Personal history of other diseases of the circulatory system: Secondary | ICD-10-CM

## 2012-02-19 DIAGNOSIS — Z7901 Long term (current) use of anticoagulants: Secondary | ICD-10-CM

## 2012-02-19 DIAGNOSIS — I2510 Atherosclerotic heart disease of native coronary artery without angina pectoris: Secondary | ICD-10-CM

## 2012-02-19 DIAGNOSIS — I2589 Other forms of chronic ischemic heart disease: Secondary | ICD-10-CM

## 2012-02-19 LAB — POCT INR: INR: 2

## 2012-02-19 NOTE — Patient Instructions (Addendum)
The current medical regimen is effective;  continue present plan and medications.  Please have blood work today  Follow up in 4 months with Dr Hochrein.  You will receive a letter in the mail 2 months before you are due.  Please call us when you receive this letter to schedule your follow up appointment.  

## 2012-02-19 NOTE — Assessment & Plan Note (Signed)
The patient has no new sypmtoms.  No further cardiovascular testing is indicated.  We will continue with aggressive risk reduction and meds as listed.  

## 2012-02-19 NOTE — Assessment & Plan Note (Signed)
He seems to be euvolemic.  At this point, no change in therapy is indicated.  We have reviewed salt and fluid restrictions.  No further cardiovascular testing is indicated.   

## 2012-02-19 NOTE — Assessment & Plan Note (Signed)
The patient  tolerates this rhythm and rate control and anticoagulation. We will continue with the meds as listed.  

## 2012-02-19 NOTE — Progress Notes (Signed)
HPI The patient presents for follow up of the his known cardiomyopathy.  Since I last saw him he has done relatively well.  He has had no new complaints. He denies any new shortness of breath, PND or orthopnea. He has had no new palpitations, presyncope or syncope. He does get some fatigue with activities if he does more than usual.    Allergies  Allergen Reactions  . Ace Inhibitors     Current Outpatient Prescriptions on File Prior to Visit  Medication Sig Dispense Refill  . carvedilol (COREG) 6.25 MG tablet TAKE ONE (1) TABLET(S) TWICE DAILY  60 tablet  5  . fenofibrate 54 MG tablet Take 1 tablet (54 mg total) by mouth daily.  30 tablet  6  . ferrous sulfate 325 (65 FE) MG tablet Take 325 mg by mouth 2 (two) times daily.       . finasteride (PROSCAR) 5 MG tablet TAKE ONE TABLET BY MOUTH ONE TIME DAILY  90 tablet  3  . levothyroxine (SYNTHROID) 100 MCG tablet Take 1 tablet (100 mcg total) by mouth daily.  30 tablet  11  . NITROSTAT 0.4 MG SL tablet DISSOLVE ONE (1) TABLET UNDER THE TONGUE EVERY 5 MINS AS NEEDED FOR CHEST PAIN--MAY REPEAT 3 TIME S  25 each  12  . simvastatin (ZOCOR) 20 MG tablet TAKE ONE TABLET BY MOUTH AT BEDTIME  30 tablet  0  . torsemide (DEMADEX) 20 MG tablet Take 2.5 tablets (50 mg total) by mouth daily.  75 tablet  6  . warfarin (COUMADIN) 2.5 MG tablet Take 2.5-5 mg by mouth daily. 1 tab daily except 2 tab on Monday       . warfarin (COUMADIN) 2.5 MG tablet TAKE AS DIRECTED  40 tablet  3  . DISCONTD: torsemide (DEMADEX) 20 MG tablet Take 20-30 mg by mouth 2 (two) times daily. 1.5 tab in am, 1 tab pm         Past Medical History  Diagnosis Date  . Coronary artery disease     a.  s/p CABG 2007;  b. cath 12/08: LM occluded, pRCA 80%, dRCA 50%, PLB 90%, S-PDA ok, S-Dx ok with occluded dist Dx; L-LAD ok, S-OM ok with 90% after graft insertion . . . . distal OM treated with DES  . Cardiomyopathy     echo 6/12 EF 40%  . GERD (gastroesophageal reflux disease)   .  Hypertension   . Atrial fibrillation   . Chronic renal insufficiency   . CVA (cerebral infarction)     left frontal  . Anemia     chronic  . Hypothyroidism   . Leg muscle spasm   . MI (myocardial infarction)     Past Surgical History  Procedure Date  . Coronary artery bypass graft 2007  . Coronary angioplasty with stent placement     ROS  As stated in the HPI and negative for all other systems.   BP 132/62  Pulse 91  Ht 5\' 11"  (1.803 m)  Wt 170 lb (77.111 kg)  BMI 23.71 kg/m2  PHYSICAL EXAM:  Frail-appearing BP 132/62  Pulse 91  Ht 5\' 11"  (1.803 m)  Wt 170 lb (77.111 kg)  BMI 23.71 kg/m2 HEENT:  Pupils equal round and reactive, fundi not visualized NECK: Jugular venous distention of 6 cm at 40 degrees, waveform within normal limits, carotid upstroke brisk and symmetric, no bruits, no thyromegaly LYMPHATICS:  No cervical LUNGS:  Clear to auscultation bilaterally BACK:  No  CVA tenderness CHEST:  Healed sternotomy scar HEART:  PMI not displaced or sustained,,S1 and S2 within normal limits, no S3, no clicks, no rubs, no murmurs, irregluar ABD:  Flat, positive bowel sounds normal in frequency in pitch, no bruits, no rebound, no guarding, no midline pulsatile mass, no hepatomegaly, no splenomegaly EXT:  2 plus pulses throughout, mild edema, no cyanosis no clubbing, mild edem  EKG:  Atrial fibrillation, rate 90s, right axis deviation, premature atrial contractions, no acute ST-T wave changes.  02/19/2012  ASSESSMENT:

## 2012-02-20 LAB — BASIC METABOLIC PANEL
Calcium: 9.1 mg/dL (ref 8.4–10.5)
Creatinine, Ser: 2.2 mg/dL — ABNORMAL HIGH (ref 0.4–1.5)
GFR: 30.85 mL/min — ABNORMAL LOW (ref >60.00)
Glucose, Bld: 108 mg/dL — ABNORMAL HIGH (ref 70–99)
Sodium: 143 mEq/L (ref 135–145)

## 2012-02-21 ENCOUNTER — Telehealth: Payer: Self-pay | Admitting: *Deleted

## 2012-02-21 NOTE — Telephone Encounter (Signed)
lmtcb

## 2012-02-22 NOTE — Telephone Encounter (Signed)
Pt's wife informed lab work [employer]

## 2012-02-22 NOTE — Telephone Encounter (Signed)
Patient wife  returning nurse Harriett Sine call, she can be reached at [phone] or [phone]

## 2012-03-06 ENCOUNTER — Other Ambulatory Visit: Payer: Self-pay | Admitting: Internal Medicine

## 2012-03-06 ENCOUNTER — Other Ambulatory Visit: Payer: Self-pay | Admitting: Cardiology

## 2012-04-17 ENCOUNTER — Ambulatory Visit (INDEPENDENT_AMBULATORY_CARE_PROVIDER_SITE_OTHER): Payer: Medicare Other | Admitting: *Deleted

## 2012-04-17 DIAGNOSIS — I4891 Unspecified atrial fibrillation: Secondary | ICD-10-CM

## 2012-04-17 DIAGNOSIS — Z7901 Long term (current) use of anticoagulants: Secondary | ICD-10-CM

## 2012-04-17 DIAGNOSIS — Z8679 Personal history of other diseases of the circulatory system: Secondary | ICD-10-CM

## 2012-04-17 LAB — POCT INR: INR: 3.5

## 2012-04-21 ENCOUNTER — Telehealth: Payer: Self-pay | Admitting: Cardiology

## 2012-04-21 NOTE — Telephone Encounter (Signed)
Wife calls back with blood pressure readings.  He is painfree today. Wife states " he got interested in it .... I just let him get occupied" Reassurance given.  Pt is painfree and wife was able to sleep last night. As was pt. 04/20/12   6:30pm   136/73 7:00pm   147/ 8:00pm   136/51 9:40pm   132/59  Hr 76  I will forward to Dr. Antoine Poche & Elita Quick. Mylo Red RN

## 2012-04-21 NOTE — Telephone Encounter (Signed)
I spoke with pt wife this morning. She stated pt had "some chest discomfort last night and took 1 of those tablets you put under your tongue"  Pt took sl NTG with relief.  States also he was " up all night because his blood pressure was up and down all night"  She wanted to know if he needed to be seen by Dr. Antoine Poche. She will call back with blood pressure readings. Mylo Red RN

## 2012-04-21 NOTE — Telephone Encounter (Signed)
Pt is having chest pain last night and he took a nitro last night and is still having chest pain

## 2012-04-21 NOTE — Telephone Encounter (Signed)
These blood pressures are fine.

## 2012-04-22 NOTE — Telephone Encounter (Signed)
Left message that the blood pressures look fine and to call back with any further problems or needs

## 2012-04-23 ENCOUNTER — Emergency Department (HOSPITAL_COMMUNITY): Payer: Medicare Other

## 2012-04-23 ENCOUNTER — Telehealth: Payer: Self-pay | Admitting: Physician Assistant

## 2012-04-23 ENCOUNTER — Observation Stay (HOSPITAL_COMMUNITY)
Admission: EM | Admit: 2012-04-23 | Discharge: 2012-04-25 | Disposition: A | Discharge status: Home / Self Care | Payer: Medicare Other | Attending: Cardiology | Admitting: Cardiology

## 2012-04-23 ENCOUNTER — Encounter (HOSPITAL_COMMUNITY): Payer: Self-pay | Admitting: *Deleted

## 2012-04-23 DIAGNOSIS — N039 Chronic nephritic syndrome with unspecified morphologic changes: Secondary | ICD-10-CM | POA: Insufficient documentation

## 2012-04-23 DIAGNOSIS — D631 Anemia in chronic kidney disease: Secondary | ICD-10-CM | POA: Insufficient documentation

## 2012-04-23 DIAGNOSIS — I2510 Atherosclerotic heart disease of native coronary artery without angina pectoris: Secondary | ICD-10-CM | POA: Insufficient documentation

## 2012-04-23 DIAGNOSIS — M25519 Pain in unspecified shoulder: Secondary | ICD-10-CM | POA: Insufficient documentation

## 2012-04-23 DIAGNOSIS — I10 Essential (primary) hypertension: Secondary | ICD-10-CM | POA: Diagnosis present

## 2012-04-23 DIAGNOSIS — I252 Old myocardial infarction: Secondary | ICD-10-CM | POA: Insufficient documentation

## 2012-04-23 DIAGNOSIS — I2589 Other forms of chronic ischemic heart disease: Secondary | ICD-10-CM | POA: Diagnosis present

## 2012-04-23 DIAGNOSIS — I129 Hypertensive chronic kidney disease with stage 1 through stage 4 chronic kidney disease, or unspecified chronic kidney disease: Secondary | ICD-10-CM | POA: Insufficient documentation

## 2012-04-23 DIAGNOSIS — N179 Acute kidney failure, unspecified: Secondary | ICD-10-CM | POA: Diagnosis present

## 2012-04-23 DIAGNOSIS — E039 Hypothyroidism, unspecified: Secondary | ICD-10-CM | POA: Insufficient documentation

## 2012-04-23 DIAGNOSIS — I4891 Unspecified atrial fibrillation: Secondary | ICD-10-CM | POA: Diagnosis present

## 2012-04-23 DIAGNOSIS — I509 Heart failure, unspecified: Secondary | ICD-10-CM | POA: Insufficient documentation

## 2012-04-23 DIAGNOSIS — D539 Nutritional anemia, unspecified: Secondary | ICD-10-CM | POA: Diagnosis present

## 2012-04-23 DIAGNOSIS — I5043 Acute on chronic combined systolic (congestive) and diastolic (congestive) heart failure: Principal | ICD-10-CM | POA: Insufficient documentation

## 2012-04-23 DIAGNOSIS — Z8673 Personal history of transient ischemic attack (TIA), and cerebral infarction without residual deficits: Secondary | ICD-10-CM | POA: Insufficient documentation

## 2012-04-23 DIAGNOSIS — N183 Chronic kidney disease, stage 3 unspecified: Secondary | ICD-10-CM | POA: Insufficient documentation

## 2012-04-23 DIAGNOSIS — Z7901 Long term (current) use of anticoagulants: Secondary | ICD-10-CM

## 2012-04-23 DIAGNOSIS — I5042 Chronic combined systolic (congestive) and diastolic (congestive) heart failure: Secondary | ICD-10-CM | POA: Diagnosis present

## 2012-04-23 HISTORY — DX: Ischemic cardiomyopathy: I25.5

## 2012-04-23 HISTORY — DX: Chronic kidney disease, stage 4 (severe): N18.4

## 2012-04-23 LAB — CBC WITH DIFFERENTIAL/PLATELET
Eosinophils Absolute: 0.3 10*3/uL (ref 0.0–0.7)
Hemoglobin: 10.8 g/dL — ABNORMAL LOW (ref 13.0–17.0)
Lymphocytes Relative: 22 % (ref 12–46)
Lymphs Abs: 1.5 10*3/uL (ref 0.7–4.0)
MCH: 30.7 pg (ref 26.0–34.0)
MCV: 93.5 fL (ref 78.0–100.0)
Monocytes Relative: 8 % (ref 3–12)
Neutrophils Relative %: 65 % (ref 43–77)
RBC: 3.52 MIL/uL — ABNORMAL LOW (ref 4.22–5.81)
WBC: 6.8 10*3/uL (ref 4.0–10.5)

## 2012-04-23 LAB — BASIC METABOLIC PANEL
CO2: 26 mEq/L (ref 19–32)
GFR calc non Af Amer: 24 mL/min — ABNORMAL LOW (ref >90)
Glucose, Bld: 160 mg/dL — ABNORMAL HIGH (ref 70–99)
Potassium: 4 mEq/L (ref 3.5–5.1)
Sodium: 139 mEq/L (ref 135–145)

## 2012-04-23 LAB — POCT I-STAT TROPONIN I: Troponin i, poc: 0.02 ng/mL (ref 0.00–0.08)

## 2012-04-23 LAB — PROTIME-INR: Prothrombin Time: 23.2 seconds — ABNORMAL HIGH (ref 11.6–15.2)

## 2012-04-23 LAB — APTT: aPTT: 33 seconds (ref 24–37)

## 2012-04-23 LAB — PRO B NATRIURETIC PEPTIDE: Pro B Natriuretic peptide (BNP): 10197 pg/mL — ABNORMAL HIGH (ref 0–450)

## 2012-04-23 MED ORDER — FUROSEMIDE 10 MG/ML IJ SOLN
40.0000 mg | Freq: Once | INTRAMUSCULAR | Status: AC
  Administered 2012-04-23: 40 mg via INTRAVENOUS
  Filled 2012-04-23: qty 4

## 2012-04-23 NOTE — ED Notes (Signed)
Pt reports exertional sob since this afternoon. Denies chest pain. Speaking in complete sentences.

## 2012-04-23 NOTE — Telephone Encounter (Signed)
Patient's wife called concerned that pt is having new worsened SOB this evening. No CP but he apparently was not himself this evening. He is 89 and has significant cardiac hx. She wondered if they should proceed to ER and I told her this sounded like the safest plan. Her daughter is coming up pick them up right now. He is stable right now, is not laboring to breathe but his wife knows to call 911 in the meantime if his condition worsens. She verbalized gratitude and understanding. Roselynn Whitacre PA-C

## 2012-04-23 NOTE — ED Provider Notes (Addendum)
History     CSN: [id number]  Arrival date & time 04/23/12  1821   First MD Initiated Contact with Patient 04/23/12 2033      Chief Complaint  Patient presents with  . Shortness of Breath    (Consider location/radiation/quality/duration/timing/severity/associated sxs/prior treatment) Patient is a 78 y.o. male presenting with shortness of breath. The history is provided by the patient.  Shortness of Breath  The current episode started 3 to 5 days ago. The onset was gradual. The problem occurs frequently. The problem has been unchanged. The problem is moderate. The symptoms are relieved by rest. The symptoms are aggravated by activity. Associated symptoms include shortness of breath. Pertinent negatives include no chest pressure, no cough and no wheezing. Associated symptoms comments: Bilateral shoulder achiness and back pain. He has had no prior steroid use. He has received no recent medical care.    Past Medical History  Diagnosis Date  . Coronary artery disease     a.  s/p CABG 2007;  b. cath 12/08: LM occluded, pRCA 80%, dRCA 50%, PLB 90%, S-PDA ok, S-Dx ok with occluded dist Dx; L-LAD ok, S-OM ok with 90% after graft insertion . . . . distal OM treated with DES  . Cardiomyopathy     echo 6/12 EF 40%  . GERD (gastroesophageal reflux disease)   . Hypertension   . Atrial fibrillation   . Chronic renal insufficiency   . CVA (cerebral infarction)     left frontal  . Anemia     chronic  . Hypothyroidism   . Leg muscle spasm   . MI (myocardial infarction)     Past Surgical History  Procedure Date  . Coronary artery bypass graft 2007  . Coronary angioplasty with stent placement     Family History  Problem Relation Age of Onset  . Cancer Mother   . Stroke Father     History  Substance Use Topics  . Smoking status: Never Smoker   . Smokeless tobacco: Not on file  . Alcohol Use: No      Review of Systems  Respiratory: Positive for shortness of breath. Negative for  cough and wheezing.   Cardiovascular: Positive for leg swelling. Negative for palpitations.  Gastrointestinal: Negative for nausea and vomiting.  All other systems reviewed and are negative.    Allergies  Ace inhibitors  Home Medications   Current Outpatient Rx  Name Route Sig Dispense Refill  . CARVEDILOL 6.25 MG PO TABS  TAKE ONE (1) TABLET(S) TWICE DAILY 60 tablet 5  . FENOFIBRATE 54 MG PO TABS Oral Take 1 tablet (54 mg total) by mouth daily. 30 tablet 6  . FERROUS SULFATE 325 (65 FE) MG PO TABS Oral Take 325 mg by mouth 2 (two) times daily.     Marland Kitchen FINASTERIDE 5 MG PO TABS  TAKE ONE TABLET BY MOUTH ONE TIME DAILY 90 tablet 3  . NITROSTAT 0.4 MG SL SUBL  DISSOLVE ONE (1) TABLET UNDER THE TONGUE EVERY 5 MINS AS NEEDED FOR CHEST PAIN--MAY REPEAT 3 TIME S 25 each 12  . SIMVASTATIN 20 MG PO TABS  TAKE ONE TABLET BY MOUTH AT BEDTIME 30 tablet 0  . SIMVASTATIN 20 MG PO TABS  TAKE ONE TABLET BY MOUTH AT BEDTIME 30 tablet 5  . SYNTHROID 100 MCG PO TABS  TAKE ONE TABLET BY MOUTH ONE TIME DAILY 30 each 5  . TORSEMIDE 20 MG PO TABS Oral Take 2.5 tablets (50 mg total) by mouth daily. 75 tablet  6  . WARFARIN SODIUM 2.5 MG PO TABS Oral Take 2.5-5 mg by mouth daily. 1 tab daily except 2 tab on Monday     . WARFARIN SODIUM 2.5 MG PO TABS  Take as directed by anticoagulation clinic 40 tablet 3    30 day supply    BP 144/67  Pulse 77  Temp 97.8 F (36.6 C) (Oral)  Resp 9  SpO2 100%  Physical Exam  Nursing note and vitals reviewed. Constitutional: He is oriented to person, place, and time. He appears well-developed and well-nourished. No distress.  HENT:  Head: Normocephalic and atraumatic.  Mouth/Throat: Oropharynx is clear and moist.  Eyes: Conjunctivae and EOM are normal. Pupils are equal, round, and reactive to light.  Neck: Normal range of motion. Neck supple.  Cardiovascular: Normal rate and intact distal pulses.  An irregularly irregular rhythm present.  No murmur  heard. Pulmonary/Chest: Effort normal and breath sounds normal. No respiratory distress. He has no wheezes. He has no rales.  Abdominal: Soft. He exhibits no distension. There is no tenderness. There is no rebound and no guarding.  Musculoskeletal: Normal range of motion. He exhibits edema. He exhibits no tenderness.       2+ edema in the ankles  Neurological: He is alert and oriented to person, place, and time.  Skin: Skin is warm and dry. No rash noted. No erythema.  Psychiatric: He has a normal mood and affect. His behavior is normal.    ED Course  Procedures (including critical care time)  Labs Reviewed  BASIC METABOLIC PANEL - Abnormal; Notable for the following:    Glucose, Bld 160 (*)     BUN 42 (*)     Creatinine, Ser 2.26 (*)     GFR calc non Af Amer 24 (*)     GFR calc Af Amer 28 (*)     All other components within normal limits  CBC WITH DIFFERENTIAL - Abnormal; Notable for the following:    RBC 3.52 (*)     Hemoglobin 10.8 (*)     HCT 32.9 (*)     Platelets 121 (*)     All other components within normal limits  PROTIME-INR - Abnormal; Notable for the following:    Prothrombin Time 23.2 (*)     INR 2.02 (*)     All other components within normal limits  PRO B NATRIURETIC PEPTIDE - Abnormal; Notable for the following:    Pro B Natriuretic peptide (BNP) 10197.0 (*)     All other components within normal limits  APTT  POCT I-STAT TROPONIN I   Dg Chest 2 View  04/23/2012  *RADIOLOGY REPORT*  Clinical Data: Shortness of breath.  CHEST - 2 VIEW  Comparison: Two-view chest 11/09/2011.  Findings: Mild cardiomegaly is stable.  Emphysematous changes are again noted.  No focal airspace disease is evident.  Ankylosis of the thoracic spine is again noted.  IMPRESSION:  1.  Stable cardiomegaly without failure. 2.  Emphysema. 3.  No acute cardiopulmonary disease.   Original Report Authenticated By: Jamesetta Orleans. MATTERN, M.D.     Date: 04/23/2012  Rate: 83  Rhythm: atrial  fibrillation  QRS Axis: normal  Intervals: normal  ST/T Wave abnormalities: normal  Conduction Disutrbances:none  Narrative Interpretation:   Old EKG Reviewed: unchanged    1. CHF (congestive heart failure)       MDM   Patient with a history of cardiomyopathy, coronary artery disease and atrial fibrillation who presents with 3-4 days of  worsening exertional dyspnea and achy pain in his shoulder blades requiring him to take nitroglycerin yesterday. Patient is currently chest pain-free and only has shortness of breath with exertion. He takes Coumadin for A. fib and CBC, and BMP are within normal limits except for his known chronic renal insufficiency at 2.6. Patient's BNP today is significantly elevated from prior. In the past less than one year ago it was 800 and today it is 10,000. Patient has no rales on exam but does have lower extremity edema. He was given IV Lasix and will be admitted for further treatment and evaluation        Gwyneth Sprout, MD 04/23/12 2109  Gwyneth Sprout, MD 04/23/12 2110

## 2012-04-24 DIAGNOSIS — I5043 Acute on chronic combined systolic (congestive) and diastolic (congestive) heart failure: Principal | ICD-10-CM

## 2012-04-24 DIAGNOSIS — R0602 Shortness of breath: Secondary | ICD-10-CM

## 2012-04-24 LAB — TSH: TSH: 0.644 u[IU]/mL (ref 0.350–4.500)

## 2012-04-24 LAB — BASIC METABOLIC PANEL
BUN: 39 mg/dL — ABNORMAL HIGH (ref 6–23)
CO2: 28 mEq/L (ref 19–32)
Calcium: 9.5 mg/dL (ref 8.4–10.5)
Calcium: 9.4 mg/dL (ref 8.4–10.5)
Chloride: 106 mEq/L (ref 96–112)
Creatinine, Ser: 2.15 mg/dL — ABNORMAL HIGH (ref 0.50–1.35)
Creatinine, Ser: 2.13 mg/dL — ABNORMAL HIGH (ref 0.50–1.35)
GFR calc Af Amer: 30 mL/min — ABNORMAL LOW (ref >90)
GFR calc non Af Amer: 25 mL/min — ABNORMAL LOW (ref >90)
Sodium: 143 mEq/L (ref 135–145)

## 2012-04-24 LAB — CBC
MCH: 30 pg (ref 26.0–34.0)
MCHC: 32.6 g/dL (ref 30.0–36.0)
MCV: 92.2 fL (ref 78.0–100.0)
Platelets: 120 10*3/uL — ABNORMAL LOW (ref 150–400)

## 2012-04-24 LAB — PROTIME-INR
INR: 1.93 — ABNORMAL HIGH (ref 0.00–1.49)
Prothrombin Time: 22.4 seconds — ABNORMAL HIGH (ref 11.6–15.2)

## 2012-04-24 LAB — LIPID PANEL
Cholesterol: 104 mg/dL (ref 0–200)
HDL: 31 mg/dL — ABNORMAL LOW (ref >39)
Total CHOL/HDL Ratio: 3.4 RATIO
Triglycerides: 54 mg/dL (ref <150)
VLDL: 11 mg/dL (ref 0–40)

## 2012-04-24 MED ORDER — ASPIRIN 300 MG RE SUPP
300.0000 mg | RECTAL | Status: AC
  Filled 2012-04-24: qty 1

## 2012-04-24 MED ORDER — ASPIRIN 81 MG PO CHEW
324.0000 mg | CHEWABLE_TABLET | ORAL | Status: AC
  Administered 2012-04-24: 324 mg via ORAL
  Filled 2012-04-24: qty 4

## 2012-04-24 MED ORDER — FENOFIBRATE 54 MG PO TABS
54.0000 mg | ORAL_TABLET | Freq: Every day | ORAL | Status: DC
  Administered 2012-04-24 – 2012-04-25 (×2): 54 mg via ORAL
  Filled 2012-04-24 (×2): qty 1

## 2012-04-24 MED ORDER — ONDANSETRON HCL 4 MG/2ML IJ SOLN: 4.0000 mg | Freq: Four times a day (QID) | INTRAMUSCULAR | Status: DC | PRN

## 2012-04-24 MED ORDER — ISOSORBIDE MONONITRATE ER 30 MG PO TB24
30.0000 mg | ORAL_TABLET | Freq: Every day | ORAL | Status: DC
  Administered 2012-04-24 – 2012-04-25 (×2): 30 mg via ORAL
  Filled 2012-04-24 (×2): qty 1

## 2012-04-24 MED ORDER — ACETAMINOPHEN 325 MG PO TABS: 650.0000 mg | ORAL_TABLET | ORAL | Status: DC | PRN

## 2012-04-24 MED ORDER — NITROGLYCERIN 0.4 MG SL SUBL: 0.4000 mg | SUBLINGUAL_TABLET | SUBLINGUAL | Status: DC | PRN

## 2012-04-24 MED ORDER — WARFARIN SODIUM 2.5 MG PO TABS
2.5000 mg | ORAL_TABLET | Freq: Once | ORAL | Status: AC
  Administered 2012-04-24: 2.5 mg via ORAL
  Filled 2012-04-24: qty 1

## 2012-04-24 MED ORDER — FINASTERIDE 5 MG PO TABS
5.0000 mg | ORAL_TABLET | Freq: Every day | ORAL | Status: DC
  Administered 2012-04-24 – 2012-04-25 (×2): 5 mg via ORAL
  Filled 2012-04-24 (×2): qty 1

## 2012-04-24 MED ORDER — FERROUS SULFATE 325 (65 FE) MG PO TABS
325.0000 mg | ORAL_TABLET | Freq: Two times a day (BID) | ORAL | Status: DC
  Administered 2012-04-24 – 2012-04-25 (×3): 325 mg via ORAL
  Filled 2012-04-24 (×4): qty 1

## 2012-04-24 MED ORDER — SIMVASTATIN 20 MG PO TABS
20.0000 mg | ORAL_TABLET | Freq: Every day | ORAL | Status: DC
  Administered 2012-04-24: 20 mg via ORAL
  Filled 2012-04-24 (×2): qty 1

## 2012-04-24 MED ORDER — ASPIRIN EC 81 MG PO TBEC
81.0000 mg | DELAYED_RELEASE_TABLET | Freq: Every day | ORAL | Status: DC
  Administered 2012-04-25: 81 mg via ORAL
  Filled 2012-04-24: qty 1

## 2012-04-24 MED ORDER — CARVEDILOL 6.25 MG PO TABS
6.2500 mg | ORAL_TABLET | Freq: Two times a day (BID) | ORAL | Status: DC
  Administered 2012-04-24 – 2012-04-25 (×3): 6.25 mg via ORAL
  Filled 2012-04-24 (×5): qty 1

## 2012-04-24 MED ORDER — TORSEMIDE 10 MG PO TABS
50.0000 mg | ORAL_TABLET | Freq: Every day | ORAL | Status: DC
  Administered 2012-04-24 – 2012-04-25 (×2): 50 mg via ORAL
  Filled 2012-04-24 (×2): qty 1

## 2012-04-24 MED ORDER — LEVOTHYROXINE SODIUM 100 MCG PO TABS
100.0000 ug | ORAL_TABLET | Freq: Every day | ORAL | Status: DC
  Administered 2012-04-24 – 2012-04-25 (×2): 100 ug via ORAL
  Filled 2012-04-24 (×3): qty 1

## 2012-04-24 MED ORDER — WARFARIN - PHARMACIST DOSING INPATIENT: Freq: Every day | Status: DC

## 2012-04-24 NOTE — ED Notes (Signed)
Trendon Zaring (wife) [phone]

## 2012-04-24 NOTE — Progress Notes (Signed)
SUBJECTIVE:  Breathing better.  Denies pain.  He had been having some back pain   PHYSICAL EXAM Filed Vitals:   04/24/12 0145 04/24/12 0200 04/24/12 0228 04/24/12 0353  BP: 125/68 131/94 147/69 154/57  Pulse: 75 84 77 72  Temp:   97.7 F (36.5 C) 97.5 F (36.4 C)  TempSrc:   Oral Oral  Resp: 18 24 20 18   Height:   5\' 11"  (1.803 m)   Weight:   164 lb 0.4 oz (74.4 kg)   SpO2: 100% 98% 98% 98%   General:  No distress Lungs:  Few left sided crackles Heart:  Irregular Abdomen:  Positive bowel sounds, no rebound no guarding Extremities:  No edema  LABS: Lab Results  Component Value Date   CKTOTAL 52 09/14/2008   CKMB 1.8 09/14/2008   TROPONINI <0.30 04/24/2012   Results for orders placed during the hospital encounter of 04/23/12 (from the past 24 hour(s))  BASIC METABOLIC PANEL     Status: Abnormal   Collection Time   04/23/12  6:34 PM      Component Value Range   Sodium 139  135 - 145 mEq/L   Potassium 4.0  3.5 - 5.1 mEq/L   Chloride 103  96 - 112 mEq/L   CO2 26  19 - 32 mEq/L   Glucose, Bld 160 (*) 70 - 99 mg/dL   BUN 42 (*) 6 - 23 mg/dL   Creatinine, Ser 1.61 (*) 0.50 - 1.35 mg/dL   Calcium 9.1  8.4 - 09.6 mg/dL   GFR calc non Af Amer 24 (*) >90 mL/min   GFR calc Af Amer 28 (*) >90 mL/min  CBC WITH DIFFERENTIAL     Status: Abnormal   Collection Time   04/23/12  6:34 PM      Component Value Range   WBC 6.8  4.0 - 10.5 K/uL   RBC 3.52 (*) 4.22 - 5.81 MIL/uL   Hemoglobin 10.8 (*) 13.0 - 17.0 g/dL   HCT 04.5 (*) 40.9 - 81.1 %   MCV 93.5  78.0 - 100.0 fL   MCH 30.7  26.0 - 34.0 pg   MCHC 32.8  30.0 - 36.0 g/dL   RDW 91.4  78.2 - 95.6 %   Platelets 121 (*) 150 - 400 K/uL   Neutrophils Relative 65  43 - 77 %   Neutro Abs 4.4  1.7 - 7.7 K/uL   Lymphocytes Relative 22  12 - 46 %   Lymphs Abs 1.5  0.7 - 4.0 K/uL   Monocytes Relative 8  3 - 12 %   Monocytes Absolute 0.6  0.1 - 1.0 K/uL   Eosinophils Relative 5  0 - 5 %   Eosinophils Absolute 0.3  0.0 - 0.7 K/uL   Basophils Relative 0  0 - 1 %   Basophils Absolute 0.0  0.0 - 0.1 K/uL  APTT     Status: Normal   Collection Time   04/23/12  6:34 PM      Component Value Range   aPTT 33  24 - 37 seconds  PROTIME-INR     Status: Abnormal   Collection Time   04/23/12  6:34 PM      Component Value Range   Prothrombin Time 23.2 (*) 11.6 - 15.2 seconds   INR 2.02 (*) 0.00 - 1.49  PRO B NATRIURETIC PEPTIDE     Status: Abnormal   Collection Time   04/23/12  6:34 PM      Component  Value Range   Pro B Natriuretic peptide (BNP) 10197.0 (*) 0 - 450 pg/mL  POCT I-STAT TROPONIN I     Status: Normal   Collection Time   04/23/12  6:44 PM      Component Value Range   Troponin i, poc 0.02  0.00 - 0.08 ng/mL   Comment 3           TROPONIN I     Status: Normal   Collection Time   04/24/12  2:54 AM      Component Value Range   Troponin I <0.30  <0.30 ng/mL  CBC     Status: Abnormal   Collection Time   04/24/12  2:54 AM      Component Value Range   WBC 7.2  4.0 - 10.5 K/uL   RBC 3.73 (*) 4.22 - 5.81 MIL/uL   Hemoglobin 11.2 (*) 13.0 - 17.0 g/dL   HCT 16.1 (*) 09.6 - 04.5 %   MCV 92.2  78.0 - 100.0 fL   MCH 30.0  26.0 - 34.0 pg   MCHC 32.6  30.0 - 36.0 g/dL   RDW 40.9  81.1 - 91.4 %   Platelets 120 (*) 150 - 400 K/uL  BASIC METABOLIC PANEL     Status: Abnormal   Collection Time   04/24/12  2:54 AM      Component Value Range   Sodium 143  135 - 145 mEq/L   Potassium 3.8  3.5 - 5.1 mEq/L   Chloride 105  96 - 112 mEq/L   CO2 27  19 - 32 mEq/L   Glucose, Bld 105 (*) 70 - 99 mg/dL   BUN 40 (*) 6 - 23 mg/dL   Creatinine, Ser 7.82 (*) 0.50 - 1.35 mg/dL   Calcium 9.5  8.4 - 95.6 mg/dL   GFR calc non Af Amer 25 (*) >90 mL/min   GFR calc Af Amer 29 (*) >90 mL/min  PROTIME-INR     Status: Abnormal   Collection Time   04/24/12  2:54 AM      Component Value Range   Prothrombin Time 22.4 (*) 11.6 - 15.2 seconds   INR 1.93 (*) 0.00 - 1.49    Intake/Output Summary (Last 24 hours) at 04/24/12 2130 Last data filed  at 04/24/12 0345  Gross per 24 hour  Intake      0 ml  Output    450 ml  Net   -450 ml    ASSESSMENT AND PLAN:  1. Dysnpea:   Possible anginal equivalent but conservative management.  ProBNP elevated.  However, with RI and tenuous status and no overt distress I will be very conservative with diuresis.  I will add a low dose nitrate.  2. Cardiomyopathy:  As above further IV lasix for now   3. Afib: INR is therapeutic.  Warfarin per pharmacy  4. Hypothyroid: TSH pending  5. CKD:  Check BMET in am.      Rollene Rotunda 04/24/2012 6:42 AM

## 2012-04-24 NOTE — Progress Notes (Signed)
ANTICOAGULATION CONSULT NOTE - Initial Consult  Pharmacy Consult for Coumadin Indication: atrial fibrillation  Allergies  Allergen Reactions  . Ace Inhibitors     Cannot remember the reaction    Patient Measurements: Height: 5\' 11"  (180.3 cm) Weight: 164 lb 0.4 oz (74.4 kg) (bed) IBW/kg (Calculated) : 75.3   Vital Signs: Temp: 97.5 F (36.4 C) (08/29 0353) Temp src: Oral (08/29 0353) BP: 154/57 mmHg (08/29 0353) Pulse Rate: 72  (08/29 0353)  Labs:  Basename 04/24/12 0254 04/23/12 1834  HGB 11.2* 10.8*  HCT 34.4* 32.9*  PLT 120* 121*  APTT -- 33  LABPROT 22.4* 23.2*  INR 1.93* 2.02*  HEPARINUNFRC -- --  CREATININE 2.15* 2.26*  CKTOTAL -- --  CKMB -- --  TROPONINI <0.30 --    Estimated Creatinine Clearance: 24 ml/min (by C-G formula based on Cr of 2.15).   Medical History: Past Medical History  Diagnosis Date  . Coronary artery disease     a.  s/p CABG 2007;  b. cath 12/08: LM occluded, pRCA 80%, dRCA 50%, PLB 90%, S-PDA ok, S-Dx ok with occluded dist Dx; L-LAD ok, S-OM ok with 90% after graft insertion . . . . distal OM treated with DES  . Cardiomyopathy     echo 6/12 EF 40%  . GERD (gastroesophageal reflux disease)   . Hypertension   . Atrial fibrillation   . Chronic renal insufficiency   . CVA (cerebral infarction)     left frontal  . Anemia     chronic  . Hypothyroidism   . Leg muscle spasm   . MI (myocardial infarction)     Medications:  Prescriptions prior to admission  Medication Sig Dispense Refill  . carvedilol (COREG) 6.25 MG tablet TAKE ONE (1) TABLET(S) TWICE DAILY  60 tablet  5  . fenofibrate 54 MG tablet Take 1 tablet (54 mg total) by mouth daily.  30 tablet  6  . ferrous sulfate 325 (65 FE) MG tablet Take 325 mg by mouth daily with breakfast.      . finasteride (PROSCAR) 5 MG tablet Take 5 mg by mouth every evening.      Marland Kitchen levothyroxine (SYNTHROID, LEVOTHROID) 112 MCG tablet Take 112 mcg by mouth 2 (two) times daily.      Marland Kitchen NITROSTAT  0.4 MG SL tablet DISSOLVE ONE (1) TABLET UNDER THE TONGUE EVERY 5 MINS AS NEEDED FOR CHEST PAIN--MAY REPEAT 3 TIME S  25 each  12  . simvastatin (ZOCOR) 20 MG tablet TAKE ONE TABLET BY MOUTH AT BEDTIME  30 tablet  5  . torsemide (DEMADEX) 20 MG tablet Take 20-30 mg by mouth 2 (two) times daily. Takes 1.5 tabs in the morning and 1 tab at night      . warfarin (COUMADIN) 2.5 MG tablet Take 2.5-5 mg by mouth daily. 1 tab daily except 2 tab on Monday         Assessment: 78 yo man to continue coumadin for afib.  INR this am 1.93 Goal of Therapy:  INR 2-3 Monitor platelets by anticoagulation protocol: Yes   Plan:  Coumadin 2.5 mg today. Check daily protimes. F/u for s&s bleeding.  Dayne Chait Poteet 04/24/2012,7:01 AM

## 2012-04-24 NOTE — Progress Notes (Addendum)
Pt BP is 80/42.  Pt is asymptomatic.  Theodore Demark PA notified.  Will continue to monitor.

## 2012-04-24 NOTE — H&P (Signed)
Cardiology H&P  Primary Care Povider: Sanda Linger, MD Primary Cardiologist: Hochrein   HPI: [name] is a 78 y.o.male with CAD and ischemic cardiomyoapthy as well as atrial fibrillation who presents to the ED tonight with 2 episodes of dyspnea that occurred this week.  The last episode prompted his presentation.  Both are described as occurring at rest and relieved with 1 sublingual nitroglylcerin.  He denies having chest pain.  The ED administered 40mg  IV lasix due to elevated BNP.  He denies any orthopnea or PND.  He has not had wt gain or LE edema.  His review of systems is positive only for one episode of abdominal pain and diarrhea a few days ago.  He is now feeling much better.   Past Medical History  Diagnosis Date  . Coronary artery disease     a.  s/p CABG 2007;  b. cath 12/08: LM occluded, pRCA 80%, dRCA 50%, PLB 90%, S-PDA ok, S-Dx ok with occluded dist Dx; L-LAD ok, S-OM ok with 90% after graft insertion . . . . distal OM treated with DES  . Cardiomyopathy     echo 6/12 EF 40%  . GERD (gastroesophageal reflux disease)   . Hypertension   . Atrial fibrillation   . Chronic renal insufficiency   . CVA (cerebral infarction)     left frontal  . Anemia     chronic  . Hypothyroidism   . Leg muscle spasm   . MI (myocardial infarction)     Past Surgical History  Procedure Date  . Coronary artery bypass graft 2007  . Coronary angioplasty with stent placement     Family History  Problem Relation Age of Onset  . Cancer Mother   . Stroke Father     Social History:  reports that he has never smoked. He does not have any smokeless tobacco history on file. He reports that he does not drink alcohol or use illicit drugs.  Allergies:  Allergies  Allergen Reactions  . Ace Inhibitors     Current Facility-Administered Medications  Medication Dose Route Frequency Provider Last Rate Last Dose  . furosemide (LASIX) injection 40 mg  40 mg Intravenous Once Gwyneth Sprout, MD    40 mg at 04/23/12 2122   Current Outpatient Prescriptions  Medication Sig Dispense Refill  . carvedilol (COREG) 6.25 MG tablet TAKE ONE (1) TABLET(S) TWICE DAILY  60 tablet  5  . fenofibrate 54 MG tablet Take 1 tablet (54 mg total) by mouth daily.  30 tablet  6  . ferrous sulfate 325 (65 FE) MG tablet Take 325 mg by mouth 2 (two) times daily.       . finasteride (PROSCAR) 5 MG tablet TAKE ONE TABLET BY MOUTH ONE TIME DAILY  90 tablet  3  . NITROSTAT 0.4 MG SL tablet DISSOLVE ONE (1) TABLET UNDER THE TONGUE EVERY 5 MINS AS NEEDED FOR CHEST PAIN--MAY REPEAT 3 TIME S  25 each  12  . simvastatin (ZOCOR) 20 MG tablet TAKE ONE TABLET BY MOUTH AT BEDTIME  30 tablet  0  . simvastatin (ZOCOR) 20 MG tablet TAKE ONE TABLET BY MOUTH AT BEDTIME  30 tablet  5  . SYNTHROID 100 MCG tablet TAKE ONE TABLET BY MOUTH ONE TIME DAILY  30 each  5  . tobramycin (TOBREX) 0.3 % ophthalmic solution       . torsemide (DEMADEX) 20 MG tablet Take 2.5 tablets (50 mg total) by mouth daily.  75 tablet  6  .  warfarin (COUMADIN) 2.5 MG tablet Take 2.5-5 mg by mouth daily. 1 tab daily except 2 tab on Monday       . warfarin (COUMADIN) 2.5 MG tablet Take as directed by anticoagulation clinic  40 tablet  3    ROS: A full review of systems is obtained and is negative except as noted in the HPI.  Physical Exam: Blood pressure 133/67, pulse 76, temperature 97.7 F (36.5 C), temperature source Oral, resp. rate 18, SpO2 100.00%.  GENERAL: no acute distress.  EYES: Extra ocular movements are intact. There is no lid lag. Sclera is anicteric.  ENT: Oropharynx is clear. Dentition is within normal limits.  NECK: Supple. The thyroid is not enlarged.  LYMPH: There are no masses or lymphadenopathy present.  HEART: Regular rate and rhythm with no m/g/r.  Normal S1/S2. JVP 8-10cm LUNGS: Clear to auscultation There are no rales, rhonchi, or wheezes.  ABDOMEN: Soft, non-tender, and non-distended with normoactive bowel sounds. There is no  hepatosplenomegaly.  EXTREMITIES: No clubbing, cyanosis, or edema.  PULSES: Carotids were +2 and equal bilaterally with no bruits. Femoral pulses were +2 and equal bilaterally. DP/PT pulses were +2 and equal bilaterally.  SKIN: Warm, dry, and intact.  NEUROLOGIC: The patient was oriented to person, place, and time. No overt neurologic deficits were detected.  PSYCH: Normal judgment and insight, mood is appropriate.   Results: Results for orders placed during the hospital encounter of 04/23/12 (from the past 24 hour(s))  BASIC METABOLIC PANEL     Status: Abnormal   Collection Time   04/23/12  6:34 PM      Component Value Range   Sodium 139  135 - 145 mEq/L   Potassium 4.0  3.5 - 5.1 mEq/L   Chloride 103  96 - 112 mEq/L   CO2 26  19 - 32 mEq/L   Glucose, Bld 160 (*) 70 - 99 mg/dL   BUN 42 (*) 6 - 23 mg/dL   Creatinine, Ser 4.09 (*) 0.50 - 1.35 mg/dL   Calcium 9.1  8.4 - 81.1 mg/dL   GFR calc non Af Amer 24 (*) >90 mL/min   GFR calc Af Amer 28 (*) >90 mL/min  CBC WITH DIFFERENTIAL     Status: Abnormal   Collection Time   04/23/12  6:34 PM      Component Value Range   WBC 6.8  4.0 - 10.5 K/uL   RBC 3.52 (*) 4.22 - 5.81 MIL/uL   Hemoglobin 10.8 (*) 13.0 - 17.0 g/dL   HCT 91.4 (*) 78.2 - 95.6 %   MCV 93.5  78.0 - 100.0 fL   MCH 30.7  26.0 - 34.0 pg   MCHC 32.8  30.0 - 36.0 g/dL   RDW 21.3  08.6 - 57.8 %   Platelets 121 (*) 150 - 400 K/uL   Neutrophils Relative 65  43 - 77 %   Neutro Abs 4.4  1.7 - 7.7 K/uL   Lymphocytes Relative 22  12 - 46 %   Lymphs Abs 1.5  0.7 - 4.0 K/uL   Monocytes Relative 8  3 - 12 %   Monocytes Absolute 0.6  0.1 - 1.0 K/uL   Eosinophils Relative 5  0 - 5 %   Eosinophils Absolute 0.3  0.0 - 0.7 K/uL   Basophils Relative 0  0 - 1 %   Basophils Absolute 0.0  0.0 - 0.1 K/uL  APTT     Status: Normal   Collection Time   04/23/12  6:34 PM      Component Value Range   aPTT 33  24 - 37 seconds  PROTIME-INR     Status: Abnormal   Collection Time   04/23/12   6:34 PM      Component Value Range   Prothrombin Time 23.2 (*) 11.6 - 15.2 seconds   INR 2.02 (*) 0.00 - 1.49  PRO B NATRIURETIC PEPTIDE     Status: Abnormal   Collection Time   04/23/12  6:34 PM      Component Value Range   Pro B Natriuretic peptide (BNP) 10197.0 (*) 0 - 450 pg/mL  POCT I-STAT TROPONIN I     Status: Normal   Collection Time   04/23/12  6:44 PM      Component Value Range   Troponin i, poc 0.02  0.00 - 0.08 ng/mL   Comment 3             EKG: atrial fibrillation without STT changes CXR: clear  Assessment/Plan: 78 yo WM with ischemic cardiomyopathy and chronic atrial fibrillation here with 2 episodes of dyspnea that were relieved with nitro SL. He received 40mg  IV lasix a few hours before I evaluated.  He currently appears fairly euvolemic but is concerned his symptoms may be related to CAD/angina. 1. Dysnpea: resolved,  Appears euvolemic and well perfused.  Brief symptoms could be anginal equivalent but likely multifactorial. - admit to observation x 24hrs, telemetry - follow serial markers and r/o for AMI - ASA 325 x 1 then 81mg  while in hospital - continue BB and statin 2.Cardiomyopathy: appears euvolemic.  BNP likely elevated in part due to CKD - continue home meds - no further IV lasix for now 3. Afib: INR is therapeutic - coumadin not currently ordered while inpt, can restart tomorrow night if discharged - rate controlled 4. Hypothyroid: -check TSH - continue synthroid 5. Dispo: if stable in AM can likely be discharged     [facility] 04/24/2012, 12:07 AM

## 2012-04-25 ENCOUNTER — Encounter (HOSPITAL_COMMUNITY): Payer: Self-pay | Admitting: Nurse Practitioner

## 2012-04-25 DIAGNOSIS — N179 Acute kidney failure, unspecified: Secondary | ICD-10-CM | POA: Diagnosis present

## 2012-04-25 DIAGNOSIS — I509 Heart failure, unspecified: Secondary | ICD-10-CM

## 2012-04-25 LAB — BASIC METABOLIC PANEL
BUN: 44 mg/dL — ABNORMAL HIGH (ref 6–23)
Chloride: 101 mEq/L (ref 96–112)
Creatinine, Ser: 2.45 mg/dL — ABNORMAL HIGH (ref 0.50–1.35)
GFR calc Af Amer: 25 mL/min — ABNORMAL LOW (ref >90)
Glucose, Bld: 142 mg/dL — ABNORMAL HIGH (ref 70–99)
Potassium: 3.8 mEq/L (ref 3.5–5.1)

## 2012-04-25 LAB — PROTIME-INR: Prothrombin Time: 22.1 seconds — ABNORMAL HIGH (ref 11.6–15.2)

## 2012-04-25 MED ORDER — WARFARIN SODIUM 4 MG PO TABS
4.0000 mg | ORAL_TABLET | Freq: Once | ORAL | Status: DC
  Filled 2012-04-25: qty 1

## 2012-04-25 MED ORDER — ISOSORBIDE MONONITRATE ER 30 MG PO TB24: 30.0000 mg | ORAL_TABLET | Freq: Every day | ORAL | Status: DC

## 2012-04-25 NOTE — Progress Notes (Signed)
    SUBJECTIVE:  Breathing better.  Denies pain.  Ready to go home.   PHYSICAL EXAM Filed Vitals:   04/24/12 1345 04/24/12 1554 04/24/12 2220 04/25/12 0642  BP: 80/42 94/54 90/41  122/46  Pulse: 81  73 72  Temp:   98.5 F (36.9 C) 97.3 F (36.3 C)  TempSrc:   Oral Oral  Resp:   18 16  Height:      Weight:    163 lb 14.4 oz (74.345 kg)  SpO2:   99% 98%   General:  No distress Lungs:  Few left sided crackles Heart:  Irregular Abdomen:  Positive bowel sounds, no rebound no guarding Extremities:  No edema  LABS: Lab Results  Component Value Date   CKTOTAL 52 09/14/2008   CKMB 1.8 09/14/2008   TROPONINI <0.30 04/24/2012   Results for orders placed during the hospital encounter of 04/23/12 (from the past 24 hour(s))  TROPONIN I     Status: Normal   Collection Time   04/24/12  9:45 AM      Component Value Range   Troponin I <0.30  <0.30 ng/mL  BASIC METABOLIC PANEL     Status: Abnormal   Collection Time   04/24/12  9:45 AM      Component Value Range   Sodium 143  135 - 145 mEq/L   Potassium 3.6  3.5 - 5.1 mEq/L   Chloride 106  96 - 112 mEq/L   CO2 28  19 - 32 mEq/L   Glucose, Bld 111 (*) 70 - 99 mg/dL   BUN 39 (*) 6 - 23 mg/dL   Creatinine, Ser 4.74 (*) 0.50 - 1.35 mg/dL   Calcium 9.4  8.4 - 25.9 mg/dL   GFR calc non Af Amer 26 (*) >90 mL/min   GFR calc Af Amer 30 (*) >90 mL/min  TROPONIN I     Status: Normal   Collection Time   04/24/12  4:14 PM      Component Value Range   Troponin I <0.30  <0.30 ng/mL    Intake/Output Summary (Last 24 hours) at 04/25/12 0751 Last data filed at 04/24/12 1333  Gross per 24 hour  Intake    360 ml  Output    500 ml  Net   -140 ml    ASSESSMENT AND PLAN:  1. Dysnpea:   No further symptoms.  OK to go home with addition of Imdur.  See extender in next 10 days or so.    2. Cardiomyopathy: he seems to be euvolemic.  At this point, no change in therapy is indicated.  We have reviewed salt and fluid restrictions.  No further  cardiovascular testing is indicated.  3. Afib: INR is subtherapeutic.  Warfarin per pharmacy  4. Hypothyroid: TSH OK  5. CKD:  BMET should have been drawn this am.  I will order this.      Rollene Rotunda 04/25/2012 7:51 AM

## 2012-04-25 NOTE — Progress Notes (Signed)
Utilization Review Completed.[name] T8/30/2013

## 2012-04-25 NOTE — Progress Notes (Signed)
ANTICOAGULATION CONSULT NOTE - Follow Up Consult  Pharmacy Consult for Coumadin Indication: atrial fibrillation  Allergies  Allergen Reactions  . Ace Inhibitors     Cannot remember the reaction    Patient Measurements: Height: 5\' 11"  (180.3 cm) Weight: 163 lb 14.4 oz (74.345 kg) (c scale) IBW/kg (Calculated) : 75.3  Heparin Dosing Weight:   Vital Signs: Temp: 97.3 F (36.3 C) (08/30 0642) Temp src: Oral (08/30 0642) BP: 122/46 mmHg (08/30 0642) Pulse Rate: 72  (08/30 0642)  Labs:  Basename 04/25/12 0720 04/24/12 1614 04/24/12 0945 04/24/12 0254 04/23/12 1834  HGB -- -- -- 11.2* 10.8*  HCT -- -- -- 34.4* 32.9*  PLT -- -- -- 120* 121*  APTT -- -- -- -- 33  LABPROT 22.1* -- -- 22.4* 23.2*  INR 1.90* -- -- 1.93* 2.02*  HEPARINUNFRC -- -- -- -- --  CREATININE -- -- 2.13* 2.15* 2.26*  CKTOTAL -- -- -- -- --  CKMB -- -- -- -- --  TROPONINI -- <0.30 <0.30 <0.30 --    Estimated Creatinine Clearance: 24.2 ml/min (by C-G formula based on Cr of 2.13).  Assessment: 89yom on Coumadin for Afib. INR (1.9) is just below goal level and did not increase with home regimen - will plan to increase dose slightly tonight to get INR moving up and will follow-up AM INR. - No CBC this AM - No significant bleeding reported  Goal of Therapy:  INR 2-3   Plan:  1. Coumadin 4mg  po x 1 today 2. Follow-up AM INR and discharge plans  Cleon Dew [phone] 04/25/2012,10:57 AM

## 2012-04-25 NOTE — Discharge Summary (Signed)
Patient ID: [name],  MRN: [id number], DOB/AGE: 1920-01-29 78 y.o.  Admit date: 04/23/2012 Discharge date: 04/25/2012  Primary Care Provider: Sanda Linger Primary Cardiologist: J. Tracia Lacomb, MD  Discharge Diagnoses Principal Problem:  *Acute on chronic combined systolic and diastolic heart failure  **Diuresed this admission with net negative I/O of 350 and reduction in weight from 164 lbs to 163 lbs.  Active Problems:  ISCHEMIC CARDIOMYOPATHY  CKD (chronic kidney disease), stage IV  HYPERTENSION  Atrial fibrillation  ANEMIA, CHRONIC  Long term current use of anticoagulant  HYPOTHYROIDISM  CAD  Allergies Allergies  Allergen Reactions  . Ace Inhibitors     Cannot remember the reaction   Procedures  None  History of Present Illness  78 y/o male with the above complex problem list.  He was in his USOH until the night of admission when he experienced dyspnea relieved with sl NTG.  This was the second episode this week and prompted him to present to the ED for evaluation.  There, his pBNP was elevated @ 10,197 and he was treated with IV lasix.  He was admitted for further evaluation.  Hospital Course  Pt had significant improvement with diuresis.  It was felt that his dyspnea may represent an anginal equivalent however given his baseline renal insufficiency and lack of objective findings of ischemia, it was felt that conservative management was the most prudent approach.  Low-dose long acting nitrate therapy was added to his regimen.  This AM, he is ambulating without difficulty and will be discharged home today in good condition.  Of note, he did have a mild bump in his creatinine with diuresis and we will repeat a bmet upon f/u on 9/9.  Discharge Vitals Blood pressure 122/46, pulse 72, temperature 97.3 F (36.3 C), temperature source Oral, resp. rate 16, height 5\' 11"  (1.803 m), weight 163 lb 14.4 oz (74.345 kg), SpO2 98.00%.  Filed Weights   04/24/12 0228 04/25/12 0642    Weight: 164 lb 0.4 oz (74.4 kg) 163 lb 14.4 oz (74.345 kg)   Labs  CBC  Basename 04/24/12 0254 04/23/12 1834  WBC 7.2 6.8  NEUTROABS -- 4.4  HGB 11.2* 10.8*  HCT 34.4* 32.9*  MCV 92.2 93.5  PLT 120* 121*   Basic Metabolic Panel  Basename 04/25/12 0817 04/24/12 0945  NA 141 143  K 3.8 3.6  CL 101 106  CO2 26 28  GLUCOSE 142* 111*  BUN 44* 39*  CREATININE 2.45* 2.13*  CALCIUM 9.3 9.4  MG -- --  PHOS -- --   Cardiac Enzymes  Basename 04/24/12 1614 04/24/12 0945 04/24/12 0254  CKTOTAL -- -- --  CKMB -- -- --  CKMBINDEX -- -- --  TROPONINI <0.30 <0.30 <0.30   Fasting Lipid Panel  Basename 04/24/12 0545  CHOL 104  HDL 31*  LDLCALC 62  TRIG 54  CHOLHDL 3.4  LDLDIRECT --   Thyroid Function Tests  Basename 04/24/12 0254  TSH 0.644  T4TOTAL --  T3FREE --  THYROIDAB --   Disposition  Pt is being discharged home today in good condition.  Follow-up Plans & Appointments  Follow-up Information    Follow up with Tereso Newcomer, PA on 05/05/2012. (11:50  - we will check a blood chemistry this day.)    Contact information:   [address] [place] [address] [phone]       Follow up with Sanda Linger, MD.   Contact information:   [address] [place] [address] [phone]        Discharge Medications  Medication List  As of 04/25/2012 12:12 PM   TAKE these medications         carvedilol 6.25 MG tablet   Commonly known as: COREG   TAKE ONE (1) TABLET(S) TWICE DAILY      fenofibrate 54 MG tablet   Take 1 tablet (54 mg total) by mouth daily.      ferrous sulfate 325 (65 FE) MG tablet   Take 325 mg by mouth daily with breakfast.      finasteride 5 MG tablet   Commonly known as: PROSCAR   Take 5 mg by mouth every evening.      isosorbide mononitrate 30 MG 24 hr tablet   Commonly known as: IMDUR   Take 1 tablet (30 mg total) by mouth daily.      levothyroxine 112  MCG tablet   Commonly known as: SYNTHROID, LEVOTHROID   Take 112 mcg by mouth 2 (two) times daily.      NITROSTAT 0.4 MG SL tablet   Generic drug: nitroGLYCERIN   DISSOLVE ONE (1) TABLET UNDER THE TONGUE EVERY 5 MINS AS NEEDED FOR CHEST PAIN--MAY REPEAT 3 TIME S      simvastatin 20 MG tablet   Commonly known as: ZOCOR   TAKE ONE TABLET BY MOUTH AT BEDTIME      torsemide 20 MG tablet   Commonly known as: DEMADEX   Take 20-30 mg by mouth 2 (two) times daily. Takes 1.5 tabs in the morning and 1 tab at night      warfarin 2.5 MG tablet   Commonly known as: COUMADIN   Take 2.5-5 mg by mouth daily. 1 tab daily except 2 tab on Monday          Outstanding Labs/Studies  BMET upon f/u.  Duration of Discharge Encounter   Greater than 30 minutes including physician time.  Signed, Nicolasa Ducking NP 04/25/2012, 12:12 PM   Patient seen and examined.  Plan as discussed in my rounding note for today and outlined above. Rollene Rotunda  04/25/2012  3:17 PM

## 2012-04-25 NOTE — Progress Notes (Signed)
Patient discharge with all belongings and daughter verbalizes understanding of discharge instructions.

## 2012-04-30 ENCOUNTER — Telehealth: Payer: Self-pay | Admitting: Cardiology

## 2012-04-30 NOTE — Telephone Encounter (Signed)
plz return call to patient wife regarding discharge instructions, [phone]

## 2012-04-30 NOTE — Telephone Encounter (Signed)
Wife needed to review discharge instructions.  Reviewed with wife the follow up appt.  Daily wts and medications.  She stated understanding.

## 2012-05-05 ENCOUNTER — Ambulatory Visit (INDEPENDENT_AMBULATORY_CARE_PROVIDER_SITE_OTHER): Payer: Medicare Other | Admitting: *Deleted

## 2012-05-05 ENCOUNTER — Other Ambulatory Visit (INDEPENDENT_AMBULATORY_CARE_PROVIDER_SITE_OTHER): Payer: Medicare Other

## 2012-05-05 ENCOUNTER — Encounter: Payer: Self-pay | Admitting: Physician Assistant

## 2012-05-05 ENCOUNTER — Ambulatory Visit (INDEPENDENT_AMBULATORY_CARE_PROVIDER_SITE_OTHER): Payer: Medicare Other | Admitting: Physician Assistant

## 2012-05-05 VITALS — BP 102/63 | HR 82 | Ht 71.5 in | Wt 165.8 lb

## 2012-05-05 DIAGNOSIS — Z7901 Long term (current) use of anticoagulants: Secondary | ICD-10-CM

## 2012-05-05 DIAGNOSIS — I2510 Atherosclerotic heart disease of native coronary artery without angina pectoris: Secondary | ICD-10-CM

## 2012-05-05 DIAGNOSIS — I5042 Chronic combined systolic (congestive) and diastolic (congestive) heart failure: Secondary | ICD-10-CM

## 2012-05-05 DIAGNOSIS — N184 Chronic kidney disease, stage 4 (severe): Secondary | ICD-10-CM

## 2012-05-05 DIAGNOSIS — I4891 Unspecified atrial fibrillation: Secondary | ICD-10-CM

## 2012-05-05 DIAGNOSIS — R0989 Other specified symptoms and signs involving the circulatory and respiratory systems: Secondary | ICD-10-CM

## 2012-05-05 DIAGNOSIS — Z8679 Personal history of other diseases of the circulatory system: Secondary | ICD-10-CM

## 2012-05-05 LAB — BASIC METABOLIC PANEL
BUN: 51 mg/dL — ABNORMAL HIGH (ref 6–23)
CO2: 28 mEq/L (ref 19–32)
Chloride: 103 mEq/L (ref 96–112)
GFR: 25.21 mL/min — ABNORMAL LOW (ref >60.00)
Glucose, Bld: 128 mg/dL — ABNORMAL HIGH (ref 70–99)
Potassium: 4.1 mEq/L (ref 3.5–5.1)
Sodium: 141 mEq/L (ref 135–145)

## 2012-05-05 NOTE — Patient Instructions (Addendum)
Your physician recommends that you return for lab work in: TODAY BMET   KEEP APPT WITH DR. HOCHREIN 06/16/12  NO CHANGES WERE MADE TODAY  WATCH FLUID INTAKE NO MORE THAN 50-60 oz DAILY

## 2012-05-05 NOTE — Progress Notes (Signed)
[address] [place], [place]  [address] Phone: [phone] Fax:  [phone]  Date:  05/05/2012   Name:  [name]   DOB:  1920-01-13   MRN:  [id number]  PCP:  Sanda Linger, MD  Primary Cardiologist:  Dr. Rollene Rotunda  Primary Electrophysiologist:  None    History of Present Illness: [name] is a 78 y.o. male who returns for post hospital followup.  He has a history of CAD, s/p CABG in 2007, status post stent to the OM in 07/2007, combined systolic and diastolic CHF, CKD and permanent atrial fibrillation on chronic Coumadin therapy.   He was admitted 8/28-8/30 with acute on chronic combined systolic and diastolic CHF. He was diuresed with IV Lasix and noted significant improvement in his dyspnea. Weight went down 1 lb.  Myocardial infarction was ruled out by serial enzymes. There was some concern that his dyspnea may represent an anginal equivalent. However, given his baseline renal insufficiency and lack of objective findings of ischemia, conservative management was pursued. Long-acting nitrates was added to his medical regimen.  He is doing better.  Dyspnea is unchanged.  No chest pain.  No syncope, orthopnea, PND, edema.    Labs: Creatinine, Ser  Date Value Range Status  04/25/2012 2.45* 0.50 - 1.35 mg/dL Final  6/96/2952 8.41* 0.50 - 1.35 mg/dL Final  11/17/4008 2.72* 0.50 - 1.35 mg/dL Final  5/36/6440 3.47* 0.50 - 1.35 mg/dL Final  12/19/9561 2.2* 0.4 - 1.5 mg/dL Final  8/75/6433 2.5* 0.4 - 1.5 mg/dL Final  29/12/1882 2.9* 0.4 - 1.5 mg/dL Final  16/01/629 2.8* 0.4 - 1.5 mg/dL Final  1/60/1093 2.5* 0.4 - 1.5 mg/dL Final  2/35/5732 2.4* 0.4 - 1.5 mg/dL Final   Weights: Wt Readings from Last 3 Encounters:  05/05/12 165 lb 12.8 oz (75.206 kg)  04/25/12 163 lb 14.4 oz (74.345 kg)  02/19/12 170 lb (77.111 kg)     Past Medical History  Diagnosis Date  . Coronary artery disease     a.  s/p MI;  b. s/p CABG 2007;  c. cath 12/08: LM  occluded, pRCA 80%, dRCA 50%, PLB 90%, S-PDA ok, S-Dx ok with occluded dist Dx; L-LAD ok, S-OM ok with 90% after graft insertion . . . . distal OM treated with DES  . Ischemic cardiomyopathy     echo 6/12 EF 35-40%, apical, dAnt, mid to dist inf-lat HK, mild AI, mod MR, mod BAE, PASP 44  . Chronic combined systolic and diastolic heart failure     admx 8/13;   . Hypertension   . Atrial fibrillation     a. on coumadin.  . CKD (chronic kidney disease), stage IV   . CVA (cerebral infarction)     left frontal  . Anemia     chronic  . Hypothyroidism   . Leg muscle spasm   . GERD (gastroesophageal reflux disease)     Current Outpatient Prescriptions  Medication Sig Dispense Refill  . carvedilol (COREG) 6.25 MG tablet TAKE ONE (1) TABLET(S) TWICE DAILY  60 tablet  5  . fenofibrate 54 MG tablet Take 1 tablet (54 mg total) by mouth daily.  30 tablet  6  . ferrous sulfate 325 (65 FE) MG tablet Take 325 mg by mouth daily with breakfast.      . finasteride (PROSCAR) 5 MG tablet Take 5 mg by mouth every evening.      . isosorbide mononitrate (IMDUR) 30 MG 24 hr tablet Take 1  tablet (30 mg total) by mouth daily.  30 tablet  6  . levothyroxine (SYNTHROID, LEVOTHROID) 112 MCG tablet Take 112 mcg by mouth 2 (two) times daily.      Marland Kitchen NITROSTAT 0.4 MG SL tablet DISSOLVE ONE (1) TABLET UNDER THE TONGUE EVERY 5 MINS AS NEEDED FOR CHEST PAIN--MAY REPEAT 3 TIME S  25 each  12  . simvastatin (ZOCOR) 20 MG tablet TAKE ONE TABLET BY MOUTH AT BEDTIME  30 tablet  5  . torsemide (DEMADEX) 20 MG tablet Take 20-30 mg by mouth 2 (two) times daily. Takes 1.5 tabs in the morning and 1 tab at night      . warfarin (COUMADIN) 2.5 MG tablet Take 2.5-5 mg by mouth daily. 1 tab daily except 2 tab on Monday         Allergies: Allergies  Allergen Reactions  . Ace Inhibitors     Cannot remember the reaction    History  Substance Use Topics  . Smoking status: Never Smoker   . Smokeless tobacco: Not on file  . Alcohol  Use: No     ROS:  Please see the history of present illness.   Feels tired.   All other systems reviewed and negative.   PHYSICAL EXAM: VS:  BP 102/63  Pulse 82  Ht 5' 11.5" (1.816 m)  Wt 165 lb 12.8 oz (75.206 kg)  BMI 22.80 kg/m2 Well nourished, well developed, in no acute distress HEENT: normal Neck: no JVD Cardiac:  normal S1, S2; irregularly irregular; no murmur Lungs:  clear to auscultation bilaterally, no wheezing, rhonchi or rales Abd: soft, nontender, no hepatomegaly Ext: no edema Skin: warm and dry Neuro:  CNs 2-12 intact, no focal abnormalities noted  EKG:  Atrial fibrillation, heart rate 82, normal axis, prolonged QT (QTc 488), nonspecific ST-T wave changes, no significant change when compared to prior tracing      ASSESSMENT AND PLAN:  1. Chronic Combined Systolic and Diastolic CHF: Volume improved. Continue current dose of Demadex. Check a basic metabolic panel today. Keep followup with Dr. Antoine Poche next month. We discussed limiting his salt. It sounds as though his diet is very high in salt. He and his wife care for themselves. His wife does not seem to be interested in cooking at home and they rely upon take out. We also discussed limiting his fluid intake to no more than 60 ounces per day.  2. Coronary Artery Disease: He is tolerating isosorbide. His breathing is improved and he is not having chest pain. He will continue this along with beta blocker and statin. He is not on aspirin as he is already on Coumadin.  3. Chronic Kidney Disease: Check a basic metabolic panel today.  4. Atrial Fibrillation: He remains on Coumadin. Rate is controlled.  5. Hyperlipidemia: Continue simvastatin.  Signed, Tereso Newcomer, PA-C  1:05 PM 05/05/2012

## 2012-05-06 ENCOUNTER — Other Ambulatory Visit: Payer: Self-pay | Admitting: *Deleted

## 2012-05-06 ENCOUNTER — Other Ambulatory Visit: Payer: Self-pay | Admitting: Cardiology

## 2012-05-06 DIAGNOSIS — I5022 Chronic systolic (congestive) heart failure: Secondary | ICD-10-CM

## 2012-05-12 ENCOUNTER — Other Ambulatory Visit (INDEPENDENT_AMBULATORY_CARE_PROVIDER_SITE_OTHER): Payer: Medicare Other

## 2012-05-12 DIAGNOSIS — I5022 Chronic systolic (congestive) heart failure: Secondary | ICD-10-CM

## 2012-05-13 ENCOUNTER — Telehealth: Payer: Self-pay | Admitting: *Deleted

## 2012-05-13 DIAGNOSIS — I4891 Unspecified atrial fibrillation: Secondary | ICD-10-CM

## 2012-05-13 LAB — BASIC METABOLIC PANEL
CO2: 28 mEq/L (ref 19–32)
Calcium: 8.5 mg/dL (ref 8.4–10.5)
Chloride: 100 mEq/L (ref 96–112)
Creatinine, Ser: 2.7 mg/dL — ABNORMAL HIGH (ref 0.4–1.5)
Glucose, Bld: 147 mg/dL — ABNORMAL HIGH (ref 70–99)

## 2012-05-13 MED ORDER — TORSEMIDE 20 MG PO TABS: 20.0000 mg | ORAL_TABLET | Freq: Two times a day (BID) | ORAL | Status: DC

## 2012-05-13 NOTE — Telephone Encounter (Signed)
Message copied by Tarri Fuller on Tue May 13, 2012  3:06 PM ------      Message from: [place], [place] T      Created: Tue May 13, 2012  2:01 PM       Creatinine creeping up and K+ low      Increase dietary K+ x 1 day (eat a banana today).      Hold evening dose of torsemide tonight      Then reduce Torsemide to 20 mg BID      Check BMET in one week.      Weigh daily and call if:  Weight up 3 lbs in one day, increased swelling or increased dyspnea.       Tereso Newcomer, PA-C  2:01 PM 05/13/2012

## 2012-05-13 NOTE — Telephone Encounter (Signed)
Message copied by Tarri Fuller on Tue May 13, 2012  4:01 PM ------      Message from: [place], [place] T      Created: Tue May 13, 2012  2:01 PM       Creatinine creeping up and K+ low      Increase dietary K+ x 1 day (eat a banana today).      Hold evening dose of torsemide tonight      Then reduce Torsemide to 20 mg BID      Check BMET in one week.      Weigh daily and call if:  Weight up 3 lbs in one day, increased swelling or increased dyspnea.       Tereso Newcomer, PA-C  2:01 PM 05/13/2012

## 2012-05-13 NOTE — Telephone Encounter (Signed)
lmom for daughter Valaria Good and go over lab results and med changes and repeat bmet, bmet can be done 9/23 same day as CVRR appt.

## 2012-05-13 NOTE — Telephone Encounter (Signed)
Pt's daughter Jacki Cones just cb to tell me that she just found out the her dad just took his morning medications @ 3 pm, I told Jacki Cones that I will d/w  [employer]. PAC and I w/cb.  D/w [employer]. And he said that was ok that pt took meds @ 3 pm just make sure no more torsemide tonight, ok to still start 20 mg bid torsemide 05/14/12. I w/cb Jacki Cones and let he know ok to still start torsemide 20 mg bid tomorrow

## 2012-05-19 ENCOUNTER — Other Ambulatory Visit: Payer: Medicare Other

## 2012-05-20 ENCOUNTER — Other Ambulatory Visit (INDEPENDENT_AMBULATORY_CARE_PROVIDER_SITE_OTHER): Payer: Medicare Other

## 2012-05-20 ENCOUNTER — Ambulatory Visit (INDEPENDENT_AMBULATORY_CARE_PROVIDER_SITE_OTHER): Payer: Medicare Other

## 2012-05-20 DIAGNOSIS — Z7901 Long term (current) use of anticoagulants: Secondary | ICD-10-CM

## 2012-05-20 DIAGNOSIS — I4891 Unspecified atrial fibrillation: Secondary | ICD-10-CM

## 2012-05-20 DIAGNOSIS — Z8679 Personal history of other diseases of the circulatory system: Secondary | ICD-10-CM

## 2012-05-20 LAB — BASIC METABOLIC PANEL
BUN: 52 mg/dL — ABNORMAL HIGH (ref 6–23)
CO2: 29 mEq/L (ref 19–32)
Calcium: 9.2 mg/dL (ref 8.4–10.5)
Glucose, Bld: 143 mg/dL — ABNORMAL HIGH (ref 70–99)
Sodium: 146 mEq/L — ABNORMAL HIGH (ref 135–145)

## 2012-05-21 ENCOUNTER — Telehealth: Payer: Self-pay | Admitting: Cardiology

## 2012-05-21 ENCOUNTER — Telehealth: Payer: Self-pay | Admitting: *Deleted

## 2012-05-21 DIAGNOSIS — I4891 Unspecified atrial fibrillation: Secondary | ICD-10-CM

## 2012-05-21 MED ORDER — POTASSIUM CHLORIDE ER 10 MEQ PO TBCR: 10.0000 meq | EXTENDED_RELEASE_TABLET | Freq: Every day | ORAL | Status: DC

## 2012-05-21 NOTE — Telephone Encounter (Signed)
Pt was given the name of Dr Judeth Cornfield Myracle.

## 2012-05-21 NOTE — Telephone Encounter (Signed)
New Problem:    Patient's wife called in wanting a reference to a Dermatologists.  Please call back.

## 2012-05-21 NOTE — Telephone Encounter (Signed)
Message copied by Tarri Fuller on Wed May 21, 2012 12:21 PM ------      Message from: [place], [place] T      Created: Tue May 20, 2012 10:07 PM       Creatinine stable.      K+ low.      Start K+ 10 mEq QD  ==> DO NOT INCREASE dietary K+      Repeat BMET in 5 days.      Tereso Newcomer, PA-C  10:06 PM 05/20/2012

## 2012-05-21 NOTE — Telephone Encounter (Signed)
pt's wife aware of lab results and to start K+ 10 daily, watch dietary K+, bmet 10/4 with CVRR appt same day. pt wife gave me verbal understanding today

## 2012-05-30 ENCOUNTER — Ambulatory Visit (INDEPENDENT_AMBULATORY_CARE_PROVIDER_SITE_OTHER): Payer: Medicare Other | Admitting: *Deleted

## 2012-05-30 ENCOUNTER — Other Ambulatory Visit (INDEPENDENT_AMBULATORY_CARE_PROVIDER_SITE_OTHER): Payer: Medicare Other

## 2012-05-30 DIAGNOSIS — I4891 Unspecified atrial fibrillation: Secondary | ICD-10-CM

## 2012-05-30 DIAGNOSIS — Z7901 Long term (current) use of anticoagulants: Secondary | ICD-10-CM

## 2012-05-30 DIAGNOSIS — Z8679 Personal history of other diseases of the circulatory system: Secondary | ICD-10-CM

## 2012-05-30 LAB — BASIC METABOLIC PANEL
CO2: 28 mEq/L (ref 19–32)
Calcium: 8.7 mg/dL (ref 8.4–10.5)
Creatinine, Ser: 2.3 mg/dL — ABNORMAL HIGH (ref 0.4–1.5)
GFR: 28.81 mL/min — ABNORMAL LOW (ref >60.00)
Glucose, Bld: 102 mg/dL — ABNORMAL HIGH (ref 70–99)
Sodium: 140 mEq/L (ref 135–145)

## 2012-06-02 ENCOUNTER — Telehealth: Payer: Self-pay | Admitting: *Deleted

## 2012-06-02 NOTE — Telephone Encounter (Signed)
Message copied by Tarri Fuller on Mon Jun 02, 2012  3:58 PM ------      Message from: [place], [place] T      Created: Mon Jun 02, 2012  8:52 AM       K+ ok      Creatinine stable      Continue with current treatment plan.      Tereso Newcomer, PA-C  8:51 AM 06/02/2012

## 2012-06-02 NOTE — Telephone Encounter (Signed)
lmom labs ok and creatinine stable, also lmom @ the home # for pt lab ok

## 2012-06-16 ENCOUNTER — Encounter: Payer: Self-pay | Admitting: Cardiology

## 2012-06-16 ENCOUNTER — Ambulatory Visit (INDEPENDENT_AMBULATORY_CARE_PROVIDER_SITE_OTHER): Payer: Medicare Other | Admitting: Cardiology

## 2012-06-16 ENCOUNTER — Ambulatory Visit (INDEPENDENT_AMBULATORY_CARE_PROVIDER_SITE_OTHER): Payer: Medicare Other | Admitting: *Deleted

## 2012-06-16 VITALS — BP 121/48 | HR 70 | Ht 70.0 in | Wt 165.8 lb

## 2012-06-16 DIAGNOSIS — Z8679 Personal history of other diseases of the circulatory system: Secondary | ICD-10-CM

## 2012-06-16 DIAGNOSIS — I10 Essential (primary) hypertension: Secondary | ICD-10-CM

## 2012-06-16 DIAGNOSIS — I2510 Atherosclerotic heart disease of native coronary artery without angina pectoris: Secondary | ICD-10-CM

## 2012-06-16 DIAGNOSIS — I5022 Chronic systolic (congestive) heart failure: Secondary | ICD-10-CM

## 2012-06-16 DIAGNOSIS — Z7901 Long term (current) use of anticoagulants: Secondary | ICD-10-CM

## 2012-06-16 DIAGNOSIS — I4891 Unspecified atrial fibrillation: Secondary | ICD-10-CM

## 2012-06-16 NOTE — Patient Instructions (Addendum)
Your physician wants you to follow-up in: 4 months with Dr. Antoine Poche.  You will receive a reminder letter in the mail two months in advance. If you don't receive a letter, please call our office to schedule the follow-up appointment.

## 2012-06-16 NOTE — Progress Notes (Signed)
HPI The patient presents for follow up of the his known cardiomyopathy.  He was hospitalized briefly in Aug for CHF.  Since then he has felt well.  The patient denies any new symptoms such as chest discomfort, neck or arm discomfort. There has been no new shortness of breath, PND or orthopnea. There have been no reported palpitations, presyncope or syncope.  He is having removal of a skin cancer from his face.   Allergies  Allergen Reactions  . Ace Inhibitors     Cannot remember the reaction    Current Outpatient Prescriptions on File Prior to Visit  Medication Sig Dispense Refill  . carvedilol (COREG) 6.25 MG tablet TAKE ONE (1) TABLET(S) TWICE DAILY  60 tablet  3  . fenofibrate 54 MG tablet Take 1 tablet (54 mg total) by mouth daily.  30 tablet  6  . ferrous sulfate 325 (65 FE) MG tablet Take 325 mg by mouth daily with breakfast.      . isosorbide mononitrate (IMDUR) 30 MG 24 hr tablet Take 1 tablet (30 mg total) by mouth daily.  30 tablet  6  . levothyroxine (SYNTHROID, LEVOTHROID) 112 MCG tablet Take 112 mcg by mouth 2 (two) times daily.      Marland Kitchen NITROSTAT 0.4 MG SL tablet DISSOLVE ONE (1) TABLET UNDER THE TONGUE EVERY 5 MINS AS NEEDED FOR CHEST PAIN--MAY REPEAT 3 TIME S  25 each  12  . potassium chloride (K-DUR) 10 MEQ tablet Take 1 tablet (10 mEq total) by mouth daily.  30 tablet  11  . simvastatin (ZOCOR) 20 MG tablet TAKE ONE TABLET BY MOUTH AT BEDTIME  30 tablet  5  . torsemide (DEMADEX) 20 MG tablet Take 1 tablet (20 mg total) by mouth 2 (two) times daily.      Marland Kitchen warfarin (COUMADIN) 2.5 MG tablet Take 2.5-5 mg by mouth daily. 1 tab daily except 2 tab on Monday         Past Medical History  Diagnosis Date  . Coronary artery disease     a.  s/p MI;  b. s/p CABG 2007;  c. cath 12/08: LM occluded, pRCA 80%, dRCA 50%, PLB 90%, S-PDA ok, S-Dx ok with occluded dist Dx; L-LAD ok, S-OM ok with 90% after graft insertion . . . . distal OM treated with DES  . Ischemic cardiomyopathy    echo 6/12 EF 35-40%, apical, dAnt, mid to dist inf-lat HK, mild AI, mod MR, mod BAE, PASP 44  . Chronic combined systolic and diastolic heart failure     admx 8/13;   . Hypertension   . Atrial fibrillation     a. on coumadin.  . CKD (chronic kidney disease), stage IV   . CVA (cerebral infarction)     left frontal  . Anemia     chronic  . Hypothyroidism   . Leg muscle spasm   . GERD (gastroesophageal reflux disease)     Past Surgical History  Procedure Date  . Coronary artery bypass graft 2007  . Coronary angioplasty with stent placement     ROS  As stated in the HPI and negative for all other systems.  PHYSICAL EXAM:  Frail-appearing BP 121/48  Pulse 70  Ht 5\' 10"  (1.778 m)  Wt 165 lb 12.8 oz (75.206 kg)  BMI 23.79 kg/m2 NECK:  No JVD at 45 degrees, waveform within normal limits, carotid upstroke brisk and symmetric, no bruits, no thyromegaly LUNGS:  Clear to auscultation bilaterally BACK:  No CVA  tenderness CHEST:  Healed sternotomy scar HEART:  PMI not displaced or sustained,,S1 and S2 within normal limits, no S3, no clicks, no rubs, no murmurs, irregluar ABD:  Flat, positive bowel sounds normal in frequency in pitch, no bruits, no rebound, no guarding, no midline pulsatile mass, no hepatomegaly, no splenomegaly EXT:  2 plus pulses throughout, mild edema, no cyanosis no clubbing, mild edema   ASSESSMENT:  ISCHEMIC CARDIOMYOPATHY -  He seems to be euvolemic. At this point, no change in therapy is indicated. We have reviewed salt and fluid restrictions. No further cardiovascular testing is indicated. He had follow up labs recently and his creat is elevated but stable.  CAD -  The patient has no new sypmtoms. No further cardiovascular testing is indicated. We will continue with aggressive risk reduction and meds as listed.   Atrial fibrillation -  The patient tolerates this rhythm and rate control and anticoagulation. We will continue with the meds as listed.

## 2012-06-30 ENCOUNTER — Ambulatory Visit (INDEPENDENT_AMBULATORY_CARE_PROVIDER_SITE_OTHER): Payer: Medicare Other

## 2012-06-30 DIAGNOSIS — Z7901 Long term (current) use of anticoagulants: Secondary | ICD-10-CM

## 2012-06-30 DIAGNOSIS — Z8679 Personal history of other diseases of the circulatory system: Secondary | ICD-10-CM

## 2012-06-30 DIAGNOSIS — I4891 Unspecified atrial fibrillation: Secondary | ICD-10-CM

## 2012-06-30 LAB — POCT INR: INR: 2.5

## 2012-07-09 ENCOUNTER — Telehealth: Payer: Self-pay | Admitting: Cardiology

## 2012-07-09 NOTE — Telephone Encounter (Signed)
Aware it is ready to be picked up at the front desk

## 2012-07-09 NOTE — Telephone Encounter (Signed)
plz return call to Asher Muir Page -Pt Son in [place]  [phone], he needs to confirm paperwork for pt handicap placard is completed before he comes by to pick it up.

## 2012-07-12 ENCOUNTER — Other Ambulatory Visit: Payer: Self-pay | Admitting: Cardiology

## 2012-07-14 NOTE — Telephone Encounter (Signed)
Please refill.

## 2012-07-21 ENCOUNTER — Ambulatory Visit (INDEPENDENT_AMBULATORY_CARE_PROVIDER_SITE_OTHER): Payer: Medicare Other | Admitting: *Deleted

## 2012-07-21 DIAGNOSIS — Z7901 Long term (current) use of anticoagulants: Secondary | ICD-10-CM

## 2012-07-21 DIAGNOSIS — I4891 Unspecified atrial fibrillation: Secondary | ICD-10-CM

## 2012-07-21 DIAGNOSIS — Z8679 Personal history of other diseases of the circulatory system: Secondary | ICD-10-CM

## 2012-07-21 LAB — POCT INR: INR: 1.8

## 2012-08-04 ENCOUNTER — Telehealth: Payer: Self-pay | Admitting: Cardiology

## 2012-08-04 ENCOUNTER — Ambulatory Visit (INDEPENDENT_AMBULATORY_CARE_PROVIDER_SITE_OTHER): Payer: Medicare Other | Admitting: *Deleted

## 2012-08-04 VITALS — BP 102/50 | HR 88

## 2012-08-04 DIAGNOSIS — I4891 Unspecified atrial fibrillation: Secondary | ICD-10-CM

## 2012-08-04 DIAGNOSIS — Z8679 Personal history of other diseases of the circulatory system: Secondary | ICD-10-CM

## 2012-08-04 DIAGNOSIS — Z7901 Long term (current) use of anticoagulants: Secondary | ICD-10-CM

## 2012-08-04 NOTE — Telephone Encounter (Signed)
New problem:   Patient has appt today for couamdin. Wife is requesting that he be seen today. C/o blood pressure  drop last night. Not feeling well. Took 2 nitro .

## 2012-08-04 NOTE — Patient Instructions (Signed)
Use a medication box so you don't forget your coumadin, keep your medications together to avoid missing them, keep reminders close by medicines, have your family to remind you to take your medications

## 2012-08-04 NOTE — Telephone Encounter (Signed)
Spoke with wife who states pt "all the sudden didn't feel well"  last night.    Took his BP which "had dropped pretty low"  130/64.  Explained to wife that is a good BP and in fact is generally lower here in the office.  He is not having any weakness chest pain sob etc now.  Most recent BP 110/47.  Pt is coming in for an appointment with [facility] this afternoon.  She will let me know if continued problems

## 2012-08-07 ENCOUNTER — Telehealth: Payer: Self-pay | Admitting: Cardiology

## 2012-08-07 NOTE — Telephone Encounter (Signed)
F/u   Pt called back and left a good number to reached them [phone]

## 2012-08-07 NOTE — Telephone Encounter (Signed)
Left message to call back  

## 2012-08-07 NOTE — Telephone Encounter (Signed)
They needs list of meds so they can work better with [employer]

## 2012-08-08 NOTE — Telephone Encounter (Signed)
Wife can not remember why she called.  She thinks it may have been about warfarin and how to take it however they got that worked out.  She will call back if she remembers with

## 2012-08-14 ENCOUNTER — Ambulatory Visit (INDEPENDENT_AMBULATORY_CARE_PROVIDER_SITE_OTHER): Payer: Medicare Other | Admitting: *Deleted

## 2012-08-14 DIAGNOSIS — Z7901 Long term (current) use of anticoagulants: Secondary | ICD-10-CM

## 2012-08-14 DIAGNOSIS — Z8679 Personal history of other diseases of the circulatory system: Secondary | ICD-10-CM

## 2012-08-14 DIAGNOSIS — I4891 Unspecified atrial fibrillation: Secondary | ICD-10-CM

## 2012-08-29 ENCOUNTER — Ambulatory Visit (INDEPENDENT_AMBULATORY_CARE_PROVIDER_SITE_OTHER): Payer: Medicare Other | Admitting: *Deleted

## 2012-08-29 DIAGNOSIS — Z7901 Long term (current) use of anticoagulants: Secondary | ICD-10-CM

## 2012-08-29 DIAGNOSIS — I4891 Unspecified atrial fibrillation: Secondary | ICD-10-CM

## 2012-08-29 DIAGNOSIS — Z8679 Personal history of other diseases of the circulatory system: Secondary | ICD-10-CM

## 2012-08-29 LAB — POCT INR: INR: 4.5

## 2012-09-05 ENCOUNTER — Ambulatory Visit (INDEPENDENT_AMBULATORY_CARE_PROVIDER_SITE_OTHER): Payer: Medicare Other | Admitting: *Deleted

## 2012-09-05 DIAGNOSIS — Z8679 Personal history of other diseases of the circulatory system: Secondary | ICD-10-CM

## 2012-09-05 DIAGNOSIS — I4891 Unspecified atrial fibrillation: Secondary | ICD-10-CM

## 2012-09-05 DIAGNOSIS — Z7901 Long term (current) use of anticoagulants: Secondary | ICD-10-CM

## 2012-09-05 LAB — POCT INR: INR: 2.5

## 2012-09-15 ENCOUNTER — Telehealth: Payer: Self-pay | Admitting: Cardiology

## 2012-09-15 NOTE — Telephone Encounter (Signed)
Spoke with daughter who states pt's feet were very swollen last night and are some better this evening.  Advised daughter to continue medications as listed, keep feet elevated above the level of his heart and decrease as much NA as possible.  She states understanding and will call back if swelling continues or worsens

## 2012-09-15 NOTE — Telephone Encounter (Signed)
Pt's dtr calling both feet and ankles severely  swollen yesterday, can't reach so far today to see how he's doing but they normally take a nap about this time, she will keep trying, no SOB, pls advise  [phone] laurie page

## 2012-09-19 ENCOUNTER — Telehealth: Payer: Self-pay | Admitting: Cardiology

## 2012-09-19 NOTE — Telephone Encounter (Signed)
Spoke with daughter who is sure the pt is having increase in the edema in his feet and legs.  Relates that the pt is not weighing daily and has not been watching his diet.  Reports his wife says he has been keeping his feet and leg elevated for the late day or so.  Daughter is aware to have pt weigh daily, decrease NA in diet,  Keep feet and legs elevated above the level of his heart and call MD on call this weekend if any change in s/s.  She states understanding.  Appointment scheduled for Monday in follow up.

## 2012-09-19 NOTE — Telephone Encounter (Signed)
New problem:  Daughter is aware that nurse is in clinic today with Dr. Antoine Poche.     Daughter talk with nurse - Pam  this week concern her father  feet . C/O Today they are still swollen. Coumadin appt cancel today due to weather.    1. What are the option during this weekend regarding feet swelling.   2.  Can patient be seen in the office on Monday.

## 2012-09-22 ENCOUNTER — Encounter: Payer: Self-pay | Admitting: Cardiology

## 2012-09-22 ENCOUNTER — Ambulatory Visit (INDEPENDENT_AMBULATORY_CARE_PROVIDER_SITE_OTHER): Payer: Medicare Other | Admitting: *Deleted

## 2012-09-22 ENCOUNTER — Ambulatory Visit (INDEPENDENT_AMBULATORY_CARE_PROVIDER_SITE_OTHER): Payer: Medicare Other | Admitting: Cardiology

## 2012-09-22 VITALS — BP 129/70 | HR 90 | Ht 71.0 in | Wt 175.1 lb

## 2012-09-22 DIAGNOSIS — I2589 Other forms of chronic ischemic heart disease: Secondary | ICD-10-CM

## 2012-09-22 DIAGNOSIS — I10 Essential (primary) hypertension: Secondary | ICD-10-CM

## 2012-09-22 DIAGNOSIS — I4891 Unspecified atrial fibrillation: Secondary | ICD-10-CM

## 2012-09-22 DIAGNOSIS — Z8679 Personal history of other diseases of the circulatory system: Secondary | ICD-10-CM

## 2012-09-22 DIAGNOSIS — Z7901 Long term (current) use of anticoagulants: Secondary | ICD-10-CM

## 2012-09-22 DIAGNOSIS — I2510 Atherosclerotic heart disease of native coronary artery without angina pectoris: Secondary | ICD-10-CM

## 2012-09-22 DIAGNOSIS — I5022 Chronic systolic (congestive) heart failure: Secondary | ICD-10-CM

## 2012-09-22 NOTE — Patient Instructions (Addendum)
Please take extra Demadex 20 mg for the next 3 days. Continue all other medications as listed  Please have blood work today (BMP)  Please wear compression stockings daily.  You MUST DECREASE YOUR SODIUM INTAKE!!!!  Continue to weigh daily.  Follow in 1 week with PA/NP.  1.5 Gram Low Sodium Diet A 1.5 gram sodium diet restricts the amount of salt in your diet. You can have no more than 1.5 grams (1500 miligrams) in 1 day. This can help lessen your risk for developing high blood pressure. This diet may also reduce your chance of having a heart attack or stroke. It is important that you know what to look for when choosing foods and drinks.  HOME CARE   Do not add salt to food.  Avoid convenience items and fast food.  Choose unsalted snack foods.  Buy products labeled "low sodium" or "no salt added" when possible.  Check food labels to learn how much sodium is in 1 serving.  When eating at a restaurant, ask that your food be prepared with less salt or none, if possible. The nutrition facts label is a good place to find how much sodium is in foods. Look for products with no more than 400 mg of sodium per serving. Remember that 1.5 g = 1500 mg. CHOOSING FOODS Grains  Avoid: Salted crackers and snack items. Some cereals, including instant hot cereals. Bread stuffing and biscuit mixes. Seasoned rice or pasta mixes.  Choose: Unsalted snack items. Low-sodium cereals, oats, puffed wheat and rice, shredded wheat. English muffins and bread. Pasta. Meats  Avoid:  Salted, canned, smoked, spiced, pickled meats, including fish and poultry. Bacon, ham, sausage, cold cuts, hot dogs, anchovies.  Choose: Low-sodium canned tuna and salmon. Fresh or frozen meat, poultry, and fish. Dairy  Avoid: Processed cheese and spreads. Cottage cheese. Buttermilk and condensed milk. Regular cheese.  Choose:  Milk. Low-sodium cottage cheese. Yogurt. Sour cream. Low-sodium cheese. Fruits and  Vegetables  Avoid:  Regular canned vegetables. Regular canned tomato sauce and paste. Frozen vegetables in sauces. Olives. Rosita Fire. Relishes. Sauerkraut.  Choose:  Low-sodium canned vegetables. Low-sodium tomato sauce and paste. Frozen or fresh vegetables. Fresh and frozen fruit. Condiments  Avoid:  Canned and packaged gravies. Worcestershire sauce. Tartar sauce. Barbecue sauce. Soy sauce. Steak sauce. Ketchup. Onion, garlic, and table salt. Meat flavorings and tenderizers.  Choose:  Fresh and dried herbs and spices. Low-sodium varieties of mustard and ketchup. Lemon juice. Tabasco sauce. Horseradish. Document Released: 09/15/2010 Document Revised: 11/05/2011 Document Reviewed: 09/15/2010 [facility] Patient Information 2013 [place], [place].

## 2012-09-22 NOTE — Progress Notes (Signed)
HPI Mr. Berkheimer' family called to have him seen because he has increased swelling in his feet. This has been slowly accumulating. He has a 10 pound weight gain since I last saw him. He's had some increased dyspnea with activity though he is limited in his activities. It turns out that he eats out a lot and his son-in-law reports he eats ham. He's not wearing compression stockings. I suspect he keeps his feet down a lot. He's had no new PND or orthopnea. He's had no new palpitations, presyncope or syncope.    Allergies  Allergen Reactions  . Ace Inhibitors     Cannot remember the reaction    Current Outpatient Prescriptions on File Prior to Visit  Medication Sig Dispense Refill  . carvedilol (COREG) 6.25 MG tablet TAKE ONE (1) TABLET(S) TWICE DAILY  60 tablet  3  . fenofibrate 54 MG tablet Take 1 tablet (54 mg total) by mouth daily.  30 tablet  6  . ferrous sulfate 325 (65 FE) MG tablet Take 325 mg by mouth daily with breakfast.      . isosorbide mononitrate (IMDUR) 30 MG 24 hr tablet Take 1 tablet (30 mg total) by mouth daily.  30 tablet  6  . levothyroxine (SYNTHROID, LEVOTHROID) 112 MCG tablet Take 112 mcg by mouth 2 (two) times daily.      Marland Kitchen NITROSTAT 0.4 MG SL tablet DISSOLVE ONE (1) TABLET UNDER THE TONGUE EVERY 5 MINS AS NEEDED FOR CHEST PAIN--MAY REPEAT 3 TIME S  25 each  12  . potassium chloride (K-DUR) 10 MEQ tablet Take 1 tablet (10 mEq total) by mouth daily.  30 tablet  11  . simvastatin (ZOCOR) 20 MG tablet TAKE ONE TABLET BY MOUTH AT BEDTIME  30 tablet  5  . torsemide (DEMADEX) 20 MG tablet Take 1 tablet (20 mg total) by mouth 2 (two) times daily.      Marland Kitchen warfarin (COUMADIN) 2.5 MG tablet TAKE AS DIRECTED BY ANTICOAGULATION CLINIC.  40 tablet  2    Past Medical History  Diagnosis Date  . Coronary artery disease     a.  s/p MI;  b. s/p CABG 2007;  c. cath 12/08: LM occluded, pRCA 80%, dRCA 50%, PLB 90%, S-PDA ok, S-Dx ok with occluded dist Dx; L-LAD ok, S-OM ok with 90% after  graft insertion . . . . distal OM treated with DES  . Ischemic cardiomyopathy     echo 6/12 EF 35-40%, apical, dAnt, mid to dist inf-lat HK, mild AI, mod MR, mod BAE, PASP 44  . Chronic combined systolic and diastolic heart failure     admx 8/13;   . Hypertension   . Atrial fibrillation     a. on coumadin.  . CKD (chronic kidney disease), stage IV   . CVA (cerebral infarction)     left frontal  . Anemia     chronic  . Hypothyroidism   . Leg muscle spasm   . GERD (gastroesophageal reflux disease)     Past Surgical History  Procedure Date  . Coronary artery bypass graft 2007  . Coronary angioplasty with stent placement     ROS  As stated in the HPI and negative for all other systems.  PHYSICAL EXAM:  Frail-appearing BP 129/70  Pulse 90  Ht 5\' 11"  (1.803 m)  Wt 175 lb 1.9 oz (79.434 kg)  BMI 24.42 kg/m2 NECK:  No JVD at 45 degrees, waveform within normal limits, carotid upstroke brisk and symmetric, no  bruits, no thyromegaly LUNGS:  Clear to auscultation bilaterally BACK:  No CVA tenderness CHEST:  Healed sternotomy scar HEART:  PMI not displaced or sustained,,S1 and S2 within normal limits, no S3, no clicks, no rubs, no murmurs, irregluar ABD:  Flat, positive bowel sounds normal in frequency in pitch, no bruits, no rebound, no guarding, no midline pulsatile mass, no hepatomegaly, no splenomegaly EXT:  2 plus pulses throughout, mild edema, no cyanosis no clubbing, mild edema  EKG:  Atrial fibrillation, rate 90s, right bundle branch block, QTC prolonged, no acute ST-T wave changes. 09/22/2012  ASSESSMENT:  ISCHEMIC CARDIOMYOPATHY -  His weight and volume is increased. We discussed salt restriction. He needs to keep his feet elevated. I will give him a prescription for compression stockings. His renal insufficiency complicates treatment. I'm going to check a basic metabolic profile today. I'm going to add an extra 20 mg of Demadex every morning for the next 3 days. He needs to  be seen next week. He might need a further increase or even a short course of Zaroxolyn.  CAD -  The patient has no new sypmtoms. No further cardiovascular testing is indicated. We will continue with aggressive risk reduction and meds as listed.   Atrial fibrillation -  The patient tolerates this rhythm and rate control and anticoagulation. We will continue with the meds as listed.

## 2012-09-23 LAB — BASIC METABOLIC PANEL
CO2: 22 mEq/L (ref 19–32)
Chloride: 113 mEq/L — ABNORMAL HIGH (ref 96–112)
Creatinine, Ser: 1.9 mg/dL — ABNORMAL HIGH (ref 0.4–1.5)
Potassium: 5.1 mEq/L (ref 3.5–5.1)

## 2012-09-26 ENCOUNTER — Telehealth: Payer: Self-pay | Admitting: Cardiology

## 2012-09-26 NOTE — Telephone Encounter (Signed)
New Problem:    Patient's wife called in wanting to know if the patient needed to wear his compression socks all day.  Patient is also having some issues with his knee and ankle that she believes is arthritis.  Please call back.

## 2012-09-26 NOTE — Telephone Encounter (Signed)
Yes - please wear compression stockings all day and remove at bedtime. Follow up on Monday as scheduled

## 2012-09-29 ENCOUNTER — Encounter: Payer: Self-pay | Admitting: Physician Assistant

## 2012-09-29 ENCOUNTER — Ambulatory Visit (INDEPENDENT_AMBULATORY_CARE_PROVIDER_SITE_OTHER): Payer: Medicare Other | Admitting: Physician Assistant

## 2012-09-29 VITALS — BP 112/60 | HR 94 | Ht 71.0 in | Wt 173.8 lb

## 2012-09-29 DIAGNOSIS — I4891 Unspecified atrial fibrillation: Secondary | ICD-10-CM

## 2012-09-29 DIAGNOSIS — N189 Chronic kidney disease, unspecified: Secondary | ICD-10-CM

## 2012-09-29 DIAGNOSIS — I5043 Acute on chronic combined systolic (congestive) and diastolic (congestive) heart failure: Secondary | ICD-10-CM

## 2012-09-29 DIAGNOSIS — I2510 Atherosclerotic heart disease of native coronary artery without angina pectoris: Secondary | ICD-10-CM

## 2012-09-29 MED ORDER — TORSEMIDE 20 MG PO TABS: 20.0000 mg | ORAL_TABLET | Freq: Two times a day (BID) | ORAL | Status: DC

## 2012-09-29 MED ORDER — FENOFIBRATE 54 MG PO TABS: 54.0000 mg | ORAL_TABLET | Freq: Every day | ORAL | Status: DC

## 2012-09-29 NOTE — Progress Notes (Signed)
[address] [place], [place]  [address] Phone: [phone], Fax:  [phone]  Date:  09/29/2012   ID:  [name] Mans Sr., DOB November 04, 1919, MRN [id number]  PCP:  Sanda Linger, MD  Primary Cardiologist:  Dr. Rollene Rotunda     History of Present Illness: [name] G Dewoody Sr. is a 79 y.o. male who returns for follow upon CHF.  He has a hx of CAD, s/p CABG in 2007, s/p stent to the OM in 07/2007, combined systolic and diastolic CHF, CKD and permanent atrial fibrillation on chronic Coumadin therapy.  He was seen by Dr. Antoine Poche 09/22/12. He was volume overloaded. He was given extra Demadex 20 mg for 3 days in row. He was counseled on reducing sodium intake.  Here with his daughter today.  Patient is not sure if he took extra Demadex or not.  His weight is down only 2 pounds.  His daughter notes that his voice sounds weak.  He denies significant dyspnea.  No chest pain.  Edema seems improved.  No syncope.    Labs (9/13):    K 3.3, creatinine 2.7 Labs (10/13):  K 4.2, creatinine 2.3 Labs (1/14):    K 5.1, creatinine 1.9  Wt Readings from Last 3 Encounters:  09/29/12 173 lb 12.8 oz (78.835 kg)  09/22/12 175 lb 1.9 oz (79.434 kg)  06/16/12 165 lb 12.8 oz (75.206 kg)     Past Medical History  Diagnosis Date  . Coronary artery disease     a.  s/p MI;  b. s/p CABG 2007;  c. cath 12/08: LM occluded, pRCA 80%, dRCA 50%, PLB 90%, S-PDA ok, S-Dx ok with occluded dist Dx; L-LAD ok, S-OM ok with 90% after graft insertion . . . . distal OM treated with DES  . Ischemic cardiomyopathy     echo 6/12 EF 35-40%, apical, dAnt, mid to dist inf-lat HK, mild AI, mod MR, mod BAE, PASP 44  . Chronic combined systolic and diastolic heart failure     admx 8/13;   . Hypertension   . Atrial fibrillation     a. on coumadin.  . CKD (chronic kidney disease), stage IV   . CVA (cerebral infarction)     left frontal  . Anemia     chronic  . Hypothyroidism   . Leg muscle spasm   . GERD  (gastroesophageal reflux disease)     Current Outpatient Prescriptions  Medication Sig Dispense Refill  . carvedilol (COREG) 6.25 MG tablet TAKE ONE (1) TABLET(S) TWICE DAILY  60 tablet  3  . ferrous sulfate 325 (65 FE) MG tablet Take 325 mg by mouth daily with breakfast.      . isosorbide mononitrate (IMDUR) 30 MG 24 hr tablet Take 1 tablet (30 mg total) by mouth daily.  30 tablet  6  . levothyroxine (SYNTHROID, LEVOTHROID) 112 MCG tablet Take 112 mcg by mouth 2 (two) times daily.      Marland Kitchen NITROSTAT 0.4 MG SL tablet DISSOLVE ONE (1) TABLET UNDER THE TONGUE EVERY 5 MINS AS NEEDED FOR CHEST PAIN--MAY REPEAT 3 TIME S  25 each  12  . potassium chloride (K-DUR) 10 MEQ tablet Take 1 tablet (10 mEq total) by mouth daily.  30 tablet  11  . simvastatin (ZOCOR) 20 MG tablet TAKE ONE TABLET BY MOUTH AT BEDTIME  30 tablet  5  . torsemide (DEMADEX) 20 MG tablet Take 1 tablet (20 mg total) by mouth 2 (two) times daily.      Marland Kitchen  warfarin (COUMADIN) 2.5 MG tablet       . fenofibrate 54 MG tablet Take 1 tablet (54 mg total) by mouth daily.  30 tablet  6    Allergies:    Allergies  Allergen Reactions  . Ace Inhibitors     Cannot remember the reaction    Social History:  The patient  reports that he has never smoked. He does not have any smokeless tobacco history on file. He reports that he does not drink alcohol or use illicit drugs.   ROS:  Please see the history of present illness.      All other systems reviewed and negative.   PHYSICAL EXAM: VS:  BP 112/60  Pulse 94  Ht 5\' 11"  (1.803 m)  Wt 173 lb 12.8 oz (78.835 kg)  BMI 24.24 kg/m2 Well nourished, well developed, in no acute distress HEENT: normal Neck: + JVD Cardiac:  normal S1, S2; irregularly irregular; no murmur Lungs:  Decreased breath sounds bilaterally, no wheezing, rhonchi or rales Abd: soft, nontender  Ext: 1+ bilateral LE edema Skin: warm and dry Neuro:  CNs 2-12 intact, no focal abnormalities noted  EKG:  AFib, HR 94, no  change from prior tracing     ASSESSMENT AND PLAN:  1. Acute on Chronic Combined Systolic and Diastolic CHF:  Main issue here is compliance with salt.  We had a long discussion about this.  I have offered to get him hooked up with a [facility] case manager.  He is not interested.  We discussed how worsening CHF will ultimately result in readmission to the hospital.  His volume seems somewhat improved.  I will have him take an extra Demadex 20 mg for 3 extra doses again this week.  Check a BMET today and again in 1 week. 2. Coronary Artery Disease:  No angina.  Continue current Rx. 3. Atrial Fibrillation:  Rate controlled.  He remains on coumadin. 4. Chronic Kidney Disease:  Will keep a close eye on renal function with adjustments in his diuretics.  5. Disposition:  Follow up with me in 1 week.  Signed, Tereso Newcomer, PA-C  4:18 PM 09/29/2012

## 2012-09-29 NOTE — Patient Instructions (Addendum)
INCREASE DEMADEX (FLUID PILL) TO 40 MG IN THE MORNING AND 20 MG IN THE EVENING THIS WEEK ON TUES, THURS, SAT ONLY;   TAKE YOUR DEMADEX ALL OTHER DAYS AS YOU NORMALLY DO 20 MG TWICE DAILY FOR THE REST OF THIS WEEK AND THEN STARTING NEXT WEEK RESUME 20 MG TWICE DAILY  LAB TODAY; BMET  REPEAT BMET IN 1 WEEK AT FOLLOW UP APPT 10/06/12  @ 3:40 WITH [place], PAC

## 2012-10-01 ENCOUNTER — Telehealth: Payer: Self-pay | Admitting: *Deleted

## 2012-10-01 ENCOUNTER — Other Ambulatory Visit (INDEPENDENT_AMBULATORY_CARE_PROVIDER_SITE_OTHER): Payer: Medicare Other

## 2012-10-01 DIAGNOSIS — I5043 Acute on chronic combined systolic (congestive) and diastolic (congestive) heart failure: Secondary | ICD-10-CM

## 2012-10-01 DIAGNOSIS — N189 Chronic kidney disease, unspecified: Secondary | ICD-10-CM

## 2012-10-01 LAB — BASIC METABOLIC PANEL
BUN: 26 mg/dL — ABNORMAL HIGH (ref 6–23)
CO2: 25 mEq/L (ref 19–32)
GFR: 26.74 mL/min — ABNORMAL LOW (ref >60.00)
Glucose, Bld: 123 mg/dL — ABNORMAL HIGH (ref 70–99)
Potassium: 5 mEq/L (ref 3.5–5.1)

## 2012-10-01 NOTE — Telephone Encounter (Signed)
Message copied by Tarri Fuller on Wed Oct 01, 2012  5:32 PM ------      Message from: [place], [place] T      Created: Wed Oct 01, 2012  5:16 PM       Creatinine stable      Continue with current treatment plan.      Tereso Newcomer, PA-C  5:16 PM 10/01/2012

## 2012-10-01 NOTE — Telephone Encounter (Signed)
daughter Jacki Cones notified about lab results with verbal understanding

## 2012-10-06 ENCOUNTER — Other Ambulatory Visit (INDEPENDENT_AMBULATORY_CARE_PROVIDER_SITE_OTHER): Payer: Medicare Other

## 2012-10-06 ENCOUNTER — Encounter: Payer: Self-pay | Admitting: Physician Assistant

## 2012-10-06 ENCOUNTER — Ambulatory Visit (INDEPENDENT_AMBULATORY_CARE_PROVIDER_SITE_OTHER): Payer: Medicare Other | Admitting: Physician Assistant

## 2012-10-06 VITALS — BP 78/37 | HR 77 | Ht 71.0 in | Wt 153.4 lb

## 2012-10-06 DIAGNOSIS — I2510 Atherosclerotic heart disease of native coronary artery without angina pectoris: Secondary | ICD-10-CM

## 2012-10-06 DIAGNOSIS — I4891 Unspecified atrial fibrillation: Secondary | ICD-10-CM

## 2012-10-06 DIAGNOSIS — R0989 Other specified symptoms and signs involving the circulatory and respiratory systems: Secondary | ICD-10-CM

## 2012-10-06 DIAGNOSIS — I5042 Chronic combined systolic (congestive) and diastolic (congestive) heart failure: Secondary | ICD-10-CM

## 2012-10-06 DIAGNOSIS — N189 Chronic kidney disease, unspecified: Secondary | ICD-10-CM

## 2012-10-06 MED ORDER — TORSEMIDE 20 MG PO TABS: 20.0000 mg | ORAL_TABLET | Freq: Every day | ORAL | Status: DC

## 2012-10-06 NOTE — Patient Instructions (Addendum)
STOP POTASSIUM AS OF TODAY 10/06/12 PER SCOTT WEAVER,,PAC  DECREASE DEMADEX 20 MG ONCE DAILY  LAB TODAY; BMET  REPEAT BMET 10/13/12 SAME DAY YOU SEE [place], [facility] 10/13/12 @ 3:40

## 2012-10-06 NOTE — Progress Notes (Signed)
[address], [address] [place], [place]  [address] Phone: [phone], Fax:  [phone]  Date:  10/06/2012   ID:  [name] Mans Sr., DOB 04-Mar-1920, MRN [id number]  PCP:  Sanda Linger, MD  Primary Cardiologist:  Dr. Rollene Rotunda     History of Present Illness: [name] G Raffel Sr. is a 79 y.o. male who returns for follow upon CHF.  He has a hx of CAD, s/p CABG in 2007, s/p stent to the OM in 07/2007, combined systolic and diastolic CHF, CKD and permanent atrial fibrillation on chronic Coumadin therapy.  He was seen by Dr. Antoine Poche 09/22/12. He was volume overloaded. He was given extra Demadex 20 mg for 3 days in row. He was counseled on reducing sodium intake.  I saw him 2/3.  He had not taken extra Demadex as planned.  Also noted to have diet rich in salt.  He is resistant to outside help.  He is here with his daughter.  She is not sure he takes his medications consistently.  I had him take extra Demadex again and follow up today.  He has had a dramatic weight loss.  He denies dyspnea, chest pain, orthopnea, PND.  LE edema much improved.  He denies syncope or near syncope.  His daughter notes he has been unsteady on his feet.   Labs (9/13):    K 3.3, creatinine 2.7 Labs (10/13):  K 4.2, creatinine 2.3 Labs (1/14):    K 5.1, creatinine 1.9 Labs (2/14):    K 5, creatinine 2.4  Wt Readings from Last 3 Encounters:  10/06/12 153 lb 6.4 oz (69.582 kg)  09/29/12 173 lb 12.8 oz (78.835 kg)  09/22/12 175 lb 1.9 oz (79.434 kg)     Past Medical History  Diagnosis Date  . Coronary artery disease     a.  s/p MI;  b. s/p CABG 2007;  c. cath 12/08: LM occluded, pRCA 80%, dRCA 50%, PLB 90%, S-PDA ok, S-Dx ok with occluded dist Dx; L-LAD ok, S-OM ok with 90% after graft insertion . . . . distal OM treated with DES  . Ischemic cardiomyopathy     echo 6/12 EF 35-40%, apical, dAnt, mid to dist inf-lat HK, mild AI, mod MR, mod BAE, PASP 44  . Chronic combined systolic and diastolic  heart failure     admx 8/13;   . Hypertension   . Atrial fibrillation     a. on coumadin.  . CKD (chronic kidney disease), stage IV   . CVA (cerebral infarction)     left frontal  . Anemia     chronic  . Hypothyroidism   . Leg muscle spasm   . GERD (gastroesophageal reflux disease)     Current Outpatient Prescriptions  Medication Sig Dispense Refill  . carvedilol (COREG) 6.25 MG tablet TAKE ONE (1) TABLET(S) TWICE DAILY  60 tablet  3  . fenofibrate 54 MG tablet Take 1 tablet (54 mg total) by mouth daily.  30 tablet  6  . ferrous sulfate 325 (65 FE) MG tablet Take 325 mg by mouth daily with breakfast.      . isosorbide mononitrate (IMDUR) 30 MG 24 hr tablet Take 1 tablet (30 mg total) by mouth daily.  30 tablet  6  . levothyroxine (SYNTHROID, LEVOTHROID) 112 MCG tablet Take 112 mcg by mouth 2 (two) times daily.      Marland Kitchen NITROSTAT 0.4 MG SL tablet DISSOLVE ONE (1) TABLET UNDER THE TONGUE EVERY 5 MINS AS  NEEDED FOR CHEST PAIN--MAY REPEAT 3 TIME S  25 each  12  . potassium chloride (K-DUR) 10 MEQ tablet Take 1 tablet (10 mEq total) by mouth daily.  30 tablet  11  . simvastatin (ZOCOR) 20 MG tablet TAKE ONE TABLET BY MOUTH AT BEDTIME  30 tablet  5  . torsemide (DEMADEX) 20 MG tablet Take 1 tablet (20 mg total) by mouth 2 (two) times daily.      Marland Kitchen warfarin (COUMADIN) 2.5 MG tablet        No current facility-administered medications for this visit.    Allergies:    Allergies  Allergen Reactions  . Ace Inhibitors     Cannot remember the reaction    Social History:  The patient  reports that he has never smoked. He does not have any smokeless tobacco history on file. He reports that he does not drink alcohol or use illicit drugs.   ROS:  Please see the history of present illness.    All other systems reviewed and negative.   PHYSICAL EXAM: VS:  BP 90/52  Pulse 80  Ht 5\' 11"  (1.803 m)  Wt 153 lb 6.4 oz (69.582 kg)  BMI 21.4 kg/m2  Filed Vitals:   10/06/12 1647 10/06/12 1648  10/06/12 1649 10/06/12 1650  BP: 81/36 97/55 85/37  79/38  Pulse: 77 82 80 80  Height:      Weight:         Well nourished, well developed, in no acute distress HEENT: normal Neck: no JVD Cardiac:  normal S1, S2; irregularly irregular; no murmur Lungs:  Decreased breath sounds bilaterally, no wheezing, rhonchi or rales Abd: soft, nontender  Ext: no edema Skin: warm and dry Neuro:  CNs 2-12 intact, no focal abnormalities noted  EKG:  AFib, HR 80, no change from prior tracing     ASSESSMENT AND PLAN:  1. Chronic Combined Systolic and Diastolic CHF:  His weight is down considerably.  I am concerned he has been largely non-compliant with his medications until now.  I am concerned he has taken his Demadex as prescribed and now, may be, over diuresed.  Hopefully, he is not in acute renal failure.  I will check orthostatics.  Decrease Demadex to once daily.  Stop K+.  Check BMET today.   2. Coronary Artery Disease:  No angina.  Continue current Rx. 3. Atrial Fibrillation:  Rate controlled.  He remains on coumadin. 4. Chronic Kidney Disease:  Check BMET today.  If creatinine is much higher, we will have to send him to the hospital for admission.   5. Disposition:  Follow up with me in 1 week.  Luna Glasgow, PA-C  4:08 PM 10/06/2012

## 2012-10-07 ENCOUNTER — Telehealth: Payer: Self-pay | Admitting: Cardiology

## 2012-10-07 LAB — BASIC METABOLIC PANEL
CO2: 30 mEq/L (ref 19–32)
Calcium: 8.7 mg/dL (ref 8.4–10.5)
GFR: 27.8 mL/min — ABNORMAL LOW (ref >60.00)
Potassium: 4.2 mEq/L (ref 3.5–5.1)
Sodium: 141 mEq/L (ref 135–145)

## 2012-10-07 NOTE — Telephone Encounter (Signed)
New problem   Was seen in the office on yesterday was told to call back this am for test results . Pending hospitalization .  Dentist appt @ 2 pm.

## 2012-10-08 ENCOUNTER — Telehealth: Payer: Self-pay | Admitting: *Deleted

## 2012-10-08 NOTE — Telephone Encounter (Signed)
Follow up call     Daughter calling would like to receive a call back today.   haven't heard anything regarding 2/11 message that was sent.

## 2012-10-08 NOTE — Telephone Encounter (Signed)
lmom for pt's daughter that labs look ok, apologized if this was a duplicate call. 

## 2012-10-08 NOTE — Telephone Encounter (Signed)
lmom for pt's daughter that labs look ok, apologized if this was a duplicate call.

## 2012-10-08 NOTE — Telephone Encounter (Signed)
Lab results reviewed yesterday afternoon. See comments on lab results. Tereso Newcomer, PA-C  8:11 AM 10/08/2012

## 2012-10-13 ENCOUNTER — Ambulatory Visit (INDEPENDENT_AMBULATORY_CARE_PROVIDER_SITE_OTHER)
Admission: RE | Admit: 2012-10-13 | Discharge: 2012-10-13 | Disposition: A | Discharge status: Home / Self Care | Payer: Medicare Other | Source: Ambulatory Visit | Attending: Physician Assistant | Admitting: Physician Assistant

## 2012-10-13 ENCOUNTER — Encounter: Payer: Self-pay | Admitting: Physician Assistant

## 2012-10-13 ENCOUNTER — Ambulatory Visit (INDEPENDENT_AMBULATORY_CARE_PROVIDER_SITE_OTHER): Payer: Medicare Other | Admitting: Physician Assistant

## 2012-10-13 ENCOUNTER — Other Ambulatory Visit (INDEPENDENT_AMBULATORY_CARE_PROVIDER_SITE_OTHER): Payer: Medicare Other

## 2012-10-13 VITALS — BP 88/52 | HR 72 | Ht 71.0 in | Wt 147.8 lb

## 2012-10-13 DIAGNOSIS — I5042 Chronic combined systolic (congestive) and diastolic (congestive) heart failure: Secondary | ICD-10-CM

## 2012-10-13 DIAGNOSIS — R634 Abnormal weight loss: Secondary | ICD-10-CM

## 2012-10-13 DIAGNOSIS — R0989 Other specified symptoms and signs involving the circulatory and respiratory systems: Secondary | ICD-10-CM

## 2012-10-13 DIAGNOSIS — N189 Chronic kidney disease, unspecified: Secondary | ICD-10-CM

## 2012-10-13 DIAGNOSIS — I4891 Unspecified atrial fibrillation: Secondary | ICD-10-CM

## 2012-10-13 DIAGNOSIS — I2510 Atherosclerotic heart disease of native coronary artery without angina pectoris: Secondary | ICD-10-CM

## 2012-10-13 LAB — CBC WITH DIFFERENTIAL/PLATELET
Basophils Relative: 0.5 % (ref 0.0–3.0)
Eosinophils Relative: 3.2 % (ref 0.0–5.0)
HCT: 38.6 % — ABNORMAL LOW (ref 39.0–52.0)
Lymphs Abs: 1.5 10*3/uL (ref 0.7–4.0)
MCHC: 33 g/dL (ref 30.0–36.0)
MCV: 93.3 fl (ref 78.0–100.0)
Monocytes Absolute: 0.4 10*3/uL (ref 0.1–1.0)
Neutro Abs: 3.2 10*3/uL (ref 1.4–7.7)
RBC: 4.14 Mil/uL — ABNORMAL LOW (ref 4.22–5.81)
WBC: 5.4 10*3/uL (ref 4.5–10.5)

## 2012-10-13 MED ORDER — TORSEMIDE 20 MG PO TABS: 10.0000 mg | ORAL_TABLET | ORAL | Status: DC

## 2012-10-13 NOTE — Progress Notes (Signed)
[address], [address] [place], [place]  [address] Phone: [phone], Fax:  [phone]  Date:  10/13/2012   ID:  [name], DOB 1919/12/26, MRN [id number]  PCP:  Sanda Linger, MD  Primary Cardiologist:  Dr. Rollene Rotunda     History of Present Illness: [name] G Welliver Sr. is a 79 y.o. male who returns for follow upon CHF.  He has a hx of CAD, s/p CABG in 2007, s/p stent to the OM in 07/2007, combined systolic and diastolic CHF, CKD and permanent AFib on chronic Coumadin therapy.  He has been followed closely for volume overload over the past few weeks.  When last seen his weight had dropped 20 lbs over 7 days.  He has a hx of non-compliance with his medications.  I was concerned that he had suddenly started to take his medications as prescribed.  Follow up labs did not demonstrate worsening renal function.  I cut back on his Demadex and brought him back today for close follow up.  He has no complaints as usual.  I witnessed him walk in with his daughter.  He ambulates slowly and has to hold on to his daughter to steady himself.  He denies dyspnea, chest pain, syncope, orthopnea, PND.  LE edema is resolved.  His appetite is good.  Sounds like he is limiting his salt more than he has in the past.   Labs (9/13):    K 3.3, creatinine 2.7 Labs (10/13):  K 4.2, creatinine 2.3 Labs (1/14):    K 5.1, creatinine 1.9 Labs (2/14):    K 5 => 4.2, creatinine 2.4 => 2.4  Wt Readings from Last 3 Encounters:  10/13/12 147 lb 12.8 oz (67.042 kg)  10/06/12 153 lb 6.4 oz (69.582 kg)  09/29/12 173 lb 12.8 oz (78.835 kg)     Past Medical History  Diagnosis Date  . Coronary artery disease     a.  s/p MI;  b. s/p CABG 2007;  c. cath 12/08: LM occluded, pRCA 80%, dRCA 50%, PLB 90%, S-PDA ok, S-Dx ok with occluded dist Dx; L-LAD ok, S-OM ok with 90% after graft insertion . . . . distal OM treated with DES  . Ischemic cardiomyopathy     echo 6/12 EF 35-40%, apical, dAnt, mid to  dist inf-lat HK, mild AI, mod MR, mod BAE, PASP 44  . Chronic combined systolic and diastolic heart failure     admx 8/13;   . Hypertension   . Atrial fibrillation     a. on coumadin.  . CKD (chronic kidney disease), stage IV   . CVA (cerebral infarction)     left frontal  . Anemia     chronic  . Hypothyroidism   . Leg muscle spasm   . GERD (gastroesophageal reflux disease)     Current Outpatient Prescriptions  Medication Sig Dispense Refill  . carvedilol (COREG) 6.25 MG tablet TAKE ONE (1) TABLET(S) TWICE DAILY  60 tablet  3  . fenofibrate 54 MG tablet Take 1 tablet (54 mg total) by mouth daily.  30 tablet  6  . ferrous sulfate 325 (65 FE) MG tablet Take 325 mg by mouth daily with breakfast.      . isosorbide mononitrate (IMDUR) 30 MG 24 hr tablet Take 1 tablet (30 mg total) by mouth daily.  30 tablet  6  . levothyroxine (SYNTHROID, LEVOTHROID) 112 MCG tablet Take 112 mcg by mouth 2 (two) times daily.      Marland Kitchen  NITROSTAT 0.4 MG SL tablet DISSOLVE ONE (1) TABLET UNDER THE TONGUE EVERY 5 MINS AS NEEDED FOR CHEST PAIN--MAY REPEAT 3 TIME S  25 each  12  . simvastatin (ZOCOR) 20 MG tablet TAKE ONE TABLET BY MOUTH AT BEDTIME  30 tablet  5  . torsemide (DEMADEX) 20 MG tablet Take 1 tablet (20 mg total) by mouth daily.      Marland Kitchen warfarin (COUMADIN) 2.5 MG tablet        No current facility-administered medications for this visit.    Allergies:    Allergies  Allergen Reactions  . Ace Inhibitors     Cannot remember the reaction    Social History:  The patient  reports that he has never smoked. He does not have any smokeless tobacco history on file. He reports that he does not drink alcohol or use illicit drugs.   ROS:  Please see the history of present illness.  No fevers, cough, melena, hematochezia, vomiting, diarrhea.  All other systems reviewed and negative.   PHYSICAL EXAM: VS:  BP 88/52  Pulse 72  Ht 5\' 11"  (1.803 m)  Wt 147 lb 12.8 oz (67.042 kg)  BMI 20.62 kg/m2  Well  nourished, well developed, in no acute distress HEENT: normal Neck: no JVD Cardiac:  normal S1, S2; irregularly irregular; no murmur Lungs:  Decreased breath sounds bilaterally, no wheezing, rhonchi or rales Abd: soft, nontender  Ext: no edema Skin: warm and dry; decreased turgor noted Neuro:  CNs 2-12 intact, no focal abnormalities noted   ASSESSMENT AND PLAN:  1. Chronic Combined Systolic and Diastolic CHF:  He continues to lose weight.  This seems to all be related to diuresis.  He was clearly volume overloaded 2 weeks ago.  He now appears dry.  I suspect he is finally taking his diuretics and other medications correctly to explain this.  Luckily his renal fxn was stable last week when checked.  I will cut back on his diuretics further.  I will have him take Torsemide 20 mg 1/2 tablet Tues, Thurs, Sat this week.  He will continue to weigh daily and call if weight increases or he has increased LE edema or dyspnea.  I discussed this extensively with the patient and his daughter and they understand.   2. Coronary Artery Disease:  No angina.  Continue current Rx. 3. Atrial Fibrillation:  Rate controlled.  He remains on coumadin. 4. Chronic Kidney Disease:  Check BMET today.   5. Weight Loss:  I suspect this is related to diuresis.  However, I will check labs (CBC, TSH, LFTs) and CXR to rule out other causes. 6. Disposition:  Follow up with Dr. Rollene Rotunda next week as planned.  Signed, Tereso Newcomer, PA-C  4:12 PM 10/13/2012

## 2012-10-13 NOTE — Addendum Note (Signed)
Addended by: Tarri Fuller on: 10/13/2012 04:58 PM   Modules accepted: Orders

## 2012-10-13 NOTE — Patient Instructions (Addendum)
Decrease Demadex (Torsemide) to 20 mg 1/2 tablet (10 mg) on Tuesday, Thursday, Saturday of this week. Weigh daily and call if:  Weight up 3 lbs in one day, increased swelling or increased dyspnea.    Keep appointment with Dr. Rollene Rotunda next week.  LABS TODAY WITH CHEST X-RAY TO BE DONE AT THE [facility] ON [address], BMET, CBC W/DIFF, TSH LFT, CXR (2 VIEW)

## 2012-10-14 ENCOUNTER — Telehealth: Payer: Self-pay | Admitting: *Deleted

## 2012-10-14 DIAGNOSIS — E039 Hypothyroidism, unspecified: Secondary | ICD-10-CM

## 2012-10-14 LAB — BASIC METABOLIC PANEL
CO2: 32 mEq/L (ref 19–32)
Chloride: 104 mEq/L (ref 96–112)
Potassium: 4.4 mEq/L (ref 3.5–5.1)

## 2012-10-14 LAB — HEPATIC FUNCTION PANEL
ALT: 19 U/L (ref 0–53)
Albumin: 4.2 g/dL (ref 3.5–5.2)
Total Protein: 7.3 g/dL (ref 6.0–8.3)

## 2012-10-14 MED ORDER — LEVOTHYROXINE SODIUM 100 MCG PO TABS: 100.0000 ug | ORAL_TABLET | Freq: Two times a day (BID) | ORAL | Status: DC

## 2012-10-14 NOTE — Telephone Encounter (Signed)
daughter [name] notified about both cxr and lab results with verbal understanding today and to have pt f/u PCP due to thyroid, daughter aware to decrease synthroid 100 mcg twice daily

## 2012-10-14 NOTE — Telephone Encounter (Signed)
Message copied by Tarri Fuller on Tue Oct 14, 2012  6:12 PM ------      Message from: [place], [place] T      Created: Tue Oct 14, 2012  1:59 PM       K+ and creatinine stable.      Hgb stable; PLT count stable      LFTs ok      TSH low.  Med list indicates he takes Synthroid 112 mcg twice daily.  Reduce this to 100 mcg twice daily and get in for follow up with PCP soon to monitor this.      Fax labs to PCP.      Tereso Newcomer, PA-C  1:59 PM 10/14/2012 ------

## 2012-10-15 NOTE — Telephone Encounter (Signed)
lmom for daughter Laurie tcb from her call about med question 

## 2012-10-15 NOTE — Telephone Encounter (Signed)
daughter calling to re-verify synthroid dose from last night. I s/w pharmacist and stated rx not filled correctly. Pharmacist apologized and says he will correct order, he will also call daughter Laurie and let her know will be corrected  

## 2012-10-15 NOTE — Telephone Encounter (Signed)
lmom for daughter Jacki Cones tcb from her call about med question

## 2012-10-15 NOTE — Telephone Encounter (Signed)
Follow up call    Daughter calling back to speak with Okey Regal    clarification on medication  Direction  & dosage synthroid

## 2012-10-15 NOTE — Telephone Encounter (Signed)
daughter calling to re-verify synthroid dose from last night. I s/w pharmacist and stated rx not filled correctly. Pharmacist apologized and says he will correct order, he will also call daughter Jacki Cones and let her know will be corrected

## 2012-10-16 ENCOUNTER — Telehealth: Payer: Self-pay | Admitting: Cardiology

## 2012-10-16 NOTE — Telephone Encounter (Signed)
Pt fell but is back up in the chair and doing fine and they need to know if he needs to come in to be seen

## 2012-10-16 NOTE — Telephone Encounter (Signed)
Per daughter - pt was going to sit down and missed the chair.  Per report he bumped his head.  There is no obvious injury-no knot.  Pt has been having dizziness for some time - this is not new and has not changed.  His is moving all extremities without difficulty.  Having no trouble with his speech or vision.  No chest pain and no SOB.  Pt has no complains.  He is getting a walker to use at home which should be there today.  Pt is going to see PCP tomorrow for abnormal TSH.  Daughter will ask about possible PT in the home for fall prevention.  She will call back with further problems or concerns.  INR on 1/27 was 2.1

## 2012-10-17 ENCOUNTER — Encounter: Payer: Self-pay | Admitting: Internal Medicine

## 2012-10-17 ENCOUNTER — Ambulatory Visit (INDEPENDENT_AMBULATORY_CARE_PROVIDER_SITE_OTHER): Payer: Medicare Other | Admitting: Internal Medicine

## 2012-10-17 VITALS — BP 98/58 | HR 62 | Temp 97.0°F

## 2012-10-17 DIAGNOSIS — E039 Hypothyroidism, unspecified: Secondary | ICD-10-CM

## 2012-10-17 DIAGNOSIS — R627 Adult failure to thrive: Secondary | ICD-10-CM

## 2012-10-17 DIAGNOSIS — I5022 Chronic systolic (congestive) heart failure: Secondary | ICD-10-CM

## 2012-10-17 DIAGNOSIS — R269 Unspecified abnormalities of gait and mobility: Secondary | ICD-10-CM

## 2012-10-17 NOTE — Patient Instructions (Signed)
It was good to see you today. We have reviewed your prior records including labs and tests today Medications reviewed, no changes at this time. Please use a weekly pill box for your medications - I suggest letting your family fill this for you once a week. we'll make referral to physical therapy for balance training. Our office will contact you regarding appointment(s) once made. Please schedule followup in 6-12 weeks with DrJones for reviewe, call sooner if problems.

## 2012-10-18 ENCOUNTER — Encounter: Payer: Self-pay | Admitting: Internal Medicine

## 2012-10-18 NOTE — Assessment & Plan Note (Signed)
Recent abnormal TSH reviewed, subsequent medication changes addressed Agree with current dosing, recommended to continue same and recheck TSH in 6-8 weeks Explained thyroid abnormalities may be related to improper medication administration at home, but dose changes were appropriate for this time. Also, dramatic weight loss in recent weeks related to aggressive diuresis rather than "acutely" hyperthyroid condition Lab Results  Component Value Date   TSH 0.03* 10/13/2012

## 2012-10-18 NOTE — Progress Notes (Signed)
  Subjective:    Patient ID: [name] Mans Sr., male    DOB: 22-May-1920, 79 y.o.   MRN: [id number]  HPI Here for review of recent abnormal TSH on evaluation of CHF exac, weight loss and fatigue by cardiology Medications adjusted by cardiology  Past Medical History  Diagnosis Date  . Coronary artery disease     a.  s/p MI;  b. s/p CABG 2007;  c. cath 12/08: LM occluded, pRCA 80%, dRCA 50%, PLB 90%, S-PDA ok, S-Dx ok with occluded dist Dx; L-LAD ok, S-OM ok with 90% after graft insertion . . . . distal OM treated with DES  . Ischemic cardiomyopathy     echo 6/12 EF 35-40%, apical, dAnt, mid to dist inf-lat HK, mild AI, mod MR, mod BAE, PASP 44  . Chronic combined systolic and diastolic heart failure     admx 8/13;   . Hypertension   . Atrial fibrillation     a. on coumadin.  . CKD (chronic kidney disease), stage IV   . CVA (cerebral infarction)     left frontal  . Anemia     chronic  . Hypothyroidism   . Leg muscle spasm   . GERD (gastroesophageal reflux disease)     Review of Systems  Constitutional: Positive for appetite change, fatigue and unexpected weight change (but stable in last 4 days following aggressive diuresis by cardiology).  Respiratory: Negative for cough, shortness of breath and wheezing.   Cardiovascular: Negative for chest pain, palpitations and leg swelling.  Gastrointestinal: Negative for nausea, vomiting, abdominal pain, diarrhea and constipation.  Neurological: Positive for weakness. Negative for dizziness, facial asymmetry and headaches.       Objective:   Physical Exam BP 98/58  Pulse 62  Temp(Src) 97 F (36.1 C) (Oral) Wt Readings from Last 3 Encounters:  10/13/12 147 lb 12.8 oz (67.042 kg)  10/06/12 153 lb 6.4 oz (69.582 kg)  09/29/12 173 lb 12.8 oz (78.835 kg)   Gen: elderly man but NAD, very HOH. Daughter at side Neck: supple, no JVD. No thyromegaly or nodules Lungs: Shallow air movement bilaterally but clear to auscultation and no  increased work of breathing Cardiovascular: irregularly irregular, no edema bilaterally  Lab Results  Component Value Date   WBC 5.4 10/13/2012   HGB 12.8* 10/13/2012   HCT 38.6* 10/13/2012   PLT 103.0* 10/13/2012   GLUCOSE 115* 10/13/2012   CHOL 104 04/24/2012   TRIG 54 04/24/2012   HDL 31* 04/24/2012   LDLCALC 62 04/24/2012   ALT 19 10/13/2012   AST 28 10/13/2012   NA 142 10/13/2012   K 4.4 10/13/2012   CL 104 10/13/2012   CREATININE 2.4* 10/13/2012   BUN 40* 10/13/2012   CO2 32 10/13/2012   TSH 0.03* 10/13/2012   INR 2.1 09/22/2012   HGBA1C 6.5 06/28/2011       Assessment & Plan:   Failure to thrive, elderly adult. Suspect multifactorial 2 advanced age, recent CHF exacerbation -also reviewed with daughter concerns about proper use of scheduled medications ( medications managed by patient's wife who is also elderly and visually impaired) -suggested use of weekly pillbox for patient and his wife. Refer to physical therapy, outpatient - for gait training and reconditioning   Also see problem list Time spent with patient and daughter today 26 minutes providing counseling on recent lab workup, CHF exacerbation, deconditioning, and issues at home. Also medication review and reconciliation

## 2012-10-18 NOTE — Assessment & Plan Note (Signed)
Related primarily to deconditioning and advanced age. Refer to OP physical therapy rather than home health at daughter's request

## 2012-10-18 NOTE — Assessment & Plan Note (Signed)
Acute on chronic exacerbation February 2014 reviewed. Currently euvolemic Follows closely with cardiology for same The current medical regimen is effective;  continue present plan and medications.

## 2012-10-20 ENCOUNTER — Ambulatory Visit (INDEPENDENT_AMBULATORY_CARE_PROVIDER_SITE_OTHER): Payer: Medicare Other | Admitting: Cardiology

## 2012-10-20 ENCOUNTER — Ambulatory Visit: Payer: Medicare Other | Admitting: Cardiology

## 2012-10-20 ENCOUNTER — Encounter: Payer: Self-pay | Admitting: Cardiology

## 2012-10-20 ENCOUNTER — Ambulatory Visit (INDEPENDENT_AMBULATORY_CARE_PROVIDER_SITE_OTHER): Payer: Medicare Other | Admitting: Pharmacist

## 2012-10-20 VITALS — BP 100/68 | HR 75 | Ht 71.0 in | Wt 150.0 lb

## 2012-10-20 DIAGNOSIS — I10 Essential (primary) hypertension: Secondary | ICD-10-CM

## 2012-10-20 DIAGNOSIS — I5022 Chronic systolic (congestive) heart failure: Secondary | ICD-10-CM

## 2012-10-20 DIAGNOSIS — Z7901 Long term (current) use of anticoagulants: Secondary | ICD-10-CM

## 2012-10-20 DIAGNOSIS — I2510 Atherosclerotic heart disease of native coronary artery without angina pectoris: Secondary | ICD-10-CM

## 2012-10-20 DIAGNOSIS — Z8679 Personal history of other diseases of the circulatory system: Secondary | ICD-10-CM

## 2012-10-20 DIAGNOSIS — I4891 Unspecified atrial fibrillation: Secondary | ICD-10-CM

## 2012-10-20 DIAGNOSIS — I219 Acute myocardial infarction, unspecified: Secondary | ICD-10-CM

## 2012-10-20 DIAGNOSIS — I2589 Other forms of chronic ischemic heart disease: Secondary | ICD-10-CM

## 2012-10-20 LAB — BASIC METABOLIC PANEL
GFR: 32.54 mL/min — ABNORMAL LOW (ref >60.00)
Glucose, Bld: 108 mg/dL — ABNORMAL HIGH (ref 70–99)
Potassium: 4.4 mEq/L (ref 3.5–5.1)
Sodium: 139 mEq/L (ref 135–145)

## 2012-10-20 MED ORDER — CARVEDILOL 3.125 MG PO TABS: ORAL_TABLET | ORAL | Status: DC

## 2012-10-20 NOTE — Patient Instructions (Addendum)
Please stop your Zocor (simvastatin) Decrease the Carvedilol to 3.125 mg one tablet twice a day Continue all other medications as listed.  Please have blood work today (BMP)  Follow up in one month with Lilian Coma.

## 2012-10-20 NOTE — Progress Notes (Signed)
HPI Since I last saw [name] he has been back to see Mr. Alben Spittle PAc.  He has now lost about 25 pounds since his peak weight.  He is having a general decline and failure to thrive. His appetite is lower. His balance has been off. He lives at home with his wife who is now 56. His family checks on him frequently. However, they cannot be sure that he is always getting his medications as prescribed. His edema is much improved. He denies any ongoing chest pressure, neck or arm discomfort. He's not having any new PND or orthopnea. He doesn't describe any palpitations, presyncope or syncope.   Allergies  Allergen Reactions  . Ace Inhibitors     Cannot remember the reaction    Current Outpatient Prescriptions on File Prior to Visit  Medication Sig Dispense Refill  . carvedilol (COREG) 6.25 MG tablet TAKE ONE (1) TABLET(S) TWICE DAILY  60 tablet  3  . fenofibrate 54 MG tablet Take 1 tablet (54 mg total) by mouth daily.  30 tablet  6  . isosorbide mononitrate (IMDUR) 30 MG 24 hr tablet Take 1 tablet (30 mg total) by mouth daily.  30 tablet  6  . levothyroxine (SYNTHROID, LEVOTHROID) 100 MCG tablet Take 1 tablet (100 mcg total) by mouth 2 (two) times daily.  60 tablet  11  . NITROSTAT 0.4 MG SL tablet DISSOLVE ONE (1) TABLET UNDER THE TONGUE EVERY 5 MINS AS NEEDED FOR CHEST PAIN--MAY REPEAT 3 TIME S  25 each  12  . simvastatin (ZOCOR) 20 MG tablet TAKE ONE TABLET BY MOUTH AT BEDTIME  30 tablet  5  . torsemide (DEMADEX) 20 MG tablet Take 0.5 tablets (10 mg total) by mouth as directed. TAKE 10 MG ON TUES, THURS AND SAT THEN STOP      . warfarin (COUMADIN) 2.5 MG tablet as directed.        No current facility-administered medications on file prior to visit.    Past Medical History  Diagnosis Date  . Coronary artery disease     a.  s/p MI;  b. s/p CABG 2007;  c. cath 12/08: LM occluded, pRCA 80%, dRCA 50%, PLB 90%, S-PDA ok, S-Dx ok with occluded dist Dx; L-LAD ok, S-OM ok with 90% after graft  insertion . . . . distal OM treated with DES  . Ischemic cardiomyopathy     echo 6/12 EF 35-40%, apical, dAnt, mid to dist inf-lat HK, mild AI, mod MR, mod BAE, PASP 44  . Chronic combined systolic and diastolic heart failure     admx 8/13;   . Hypertension   . Atrial fibrillation     a. on coumadin.  . CKD (chronic kidney disease), stage IV   . CVA (cerebral infarction)     left frontal  . Anemia     chronic  . Hypothyroidism   . Leg muscle spasm   . GERD (gastroesophageal reflux disease)     Past Surgical History  Procedure Laterality Date  . Coronary artery bypass graft  2007  . Coronary angioplasty with stent placement      ROS  As stated in the HPI and negative for all other systems.  PHYSICAL EXAM:  Frail-appearing BP 100/68  Pulse 75  Ht 5\' 11"  (1.803 m)  Wt 150 lb (68.04 kg)  BMI 20.93 kg/m2 NECK:  No JVD at 45 degrees, waveform within normal limits, carotid upstroke brisk and symmetric, no bruits, no thyromegaly LUNGS:  Clear to  auscultation bilaterally BACK:  No CVA tenderness CHEST:  Healed sternotomy scar HEART:  PMI not displaced or sustained,,S1 and S2 within normal limits, no S3, no clicks, no rubs, no murmurs, irregluar ABD:  Flat, positive bowel sounds normal in frequency in pitch, no bruits, no rebound, no guarding, no midline pulsatile mass, no hepatomegaly, no splenomegaly EXT:  2 plus pulses throughout, mild edema, no cyanosis no clubbing, mild edema   ASSESSMENT:  ISCHEMIC CARDIOMYOPATHY -  His weight is down dramatically. Certainly he could be dehydrated and he does have known renal insufficiency. His creatinine was stable last week and I will check it again today. Because his blood pressure runs low and he is so frail I will back off on his carvedilol.  CAD -  The patient has no new sypmtoms. No further cardiovascular testing is indicated. We will continue with aggressive risk reduction and meds as listed.   Atrial fibrillation -  The patient  tolerates this rhythm and rate control and anticoagulation.  We had a long discussion about the risks benefits of anticoagulation and for now given her risk of approximately 9% per year of stroke I think he should continue his warfarin. He is going to get a home physical therapy consult to remove any potential obstacles that could contribute to falling.  Dyslipidemia - Given his frailty and progressive muscle weakness I am going to discontinue Zocor. I think a few her medicines should be the goal in this situation.

## 2012-10-24 ENCOUNTER — Telehealth: Payer: Self-pay | Admitting: *Deleted

## 2012-10-24 ENCOUNTER — Telehealth: Payer: Self-pay | Admitting: Cardiology

## 2012-10-24 NOTE — Telephone Encounter (Signed)
New Problem:    Patient called in wanting to speak with Selena Batten about his coumadin doses.  Please call back.

## 2012-10-24 NOTE — Telephone Encounter (Signed)
Wife concerned about pts instructed coumadin dose  On 10/20/2012, states she did not get correct dosing from pts daughter,  did not give his coumadin on Wednesday and Thursday of this week, dose was already held on Monday and Tuesday due to INR  4.7. Instructed wife to give an extra 1/2 tablet today, an extra whole tablet tomorrow, Sunday 1 tablet, come to coumadin clinic on Monday 3/3 at 1:15pm.

## 2012-10-24 NOTE — Telephone Encounter (Signed)
Patients wife called back and states she did give patient his warfarin on Wednesday 10/22/2012,  which would have been 1 tablet, changed dosing from last note  to add 1/2 tablet today, 1 tablet on Saturday and 1 tablet on Sunday, come to coumadin clinic on Monday per scheduled for INR check.

## 2012-10-30 ENCOUNTER — Ambulatory Visit (INDEPENDENT_AMBULATORY_CARE_PROVIDER_SITE_OTHER): Payer: Medicare Other

## 2012-10-30 DIAGNOSIS — Z7901 Long term (current) use of anticoagulants: Secondary | ICD-10-CM

## 2012-10-30 DIAGNOSIS — Z8679 Personal history of other diseases of the circulatory system: Secondary | ICD-10-CM

## 2012-10-30 DIAGNOSIS — I4891 Unspecified atrial fibrillation: Secondary | ICD-10-CM

## 2012-11-12 ENCOUNTER — Ambulatory Visit (INDEPENDENT_AMBULATORY_CARE_PROVIDER_SITE_OTHER): Payer: Medicare Other | Admitting: *Deleted

## 2012-11-12 ENCOUNTER — Emergency Department (HOSPITAL_COMMUNITY): Payer: Medicare Other

## 2012-11-12 ENCOUNTER — Observation Stay (HOSPITAL_COMMUNITY)
Admission: EM | Admit: 2012-11-12 | Discharge: 2012-11-15 | Disposition: A | Discharge status: Home / Self Care | Payer: Medicare Other | Attending: Internal Medicine | Admitting: Internal Medicine

## 2012-11-12 ENCOUNTER — Encounter (HOSPITAL_COMMUNITY): Payer: Self-pay

## 2012-11-12 DIAGNOSIS — Z992 Dependence on renal dialysis: Secondary | ICD-10-CM | POA: Insufficient documentation

## 2012-11-12 DIAGNOSIS — N185 Chronic kidney disease, stage 5: Secondary | ICD-10-CM | POA: Insufficient documentation

## 2012-11-12 DIAGNOSIS — Z8679 Personal history of other diseases of the circulatory system: Secondary | ICD-10-CM

## 2012-11-12 DIAGNOSIS — I5022 Chronic systolic (congestive) heart failure: Secondary | ICD-10-CM | POA: Diagnosis present

## 2012-11-12 DIAGNOSIS — Z7901 Long term (current) use of anticoagulants: Secondary | ICD-10-CM

## 2012-11-12 DIAGNOSIS — I4891 Unspecified atrial fibrillation: Secondary | ICD-10-CM

## 2012-11-12 DIAGNOSIS — N184 Chronic kidney disease, stage 4 (severe): Secondary | ICD-10-CM

## 2012-11-12 DIAGNOSIS — N189 Chronic kidney disease, unspecified: Secondary | ICD-10-CM

## 2012-11-12 DIAGNOSIS — I2510 Atherosclerotic heart disease of native coronary artery without angina pectoris: Secondary | ICD-10-CM | POA: Insufficient documentation

## 2012-11-12 DIAGNOSIS — I951 Orthostatic hypotension: Secondary | ICD-10-CM | POA: Diagnosis present

## 2012-11-12 DIAGNOSIS — I5042 Chronic combined systolic (congestive) and diastolic (congestive) heart failure: Secondary | ICD-10-CM | POA: Insufficient documentation

## 2012-11-12 DIAGNOSIS — R42 Dizziness and giddiness: Principal | ICD-10-CM | POA: Insufficient documentation

## 2012-11-12 DIAGNOSIS — I120 Hypertensive chronic kidney disease with stage 5 chronic kidney disease or end stage renal disease: Secondary | ICD-10-CM | POA: Insufficient documentation

## 2012-11-12 LAB — CBC WITH DIFFERENTIAL/PLATELET
Basophils Absolute: 0 10*3/uL (ref 0.0–0.1)
Basophils Relative: 0 % (ref 0–1)
Eosinophils Relative: 2 % (ref 0–5)
HCT: 35.3 % — ABNORMAL LOW (ref 39.0–52.0)
MCH: 31.1 pg (ref 26.0–34.0)
MCHC: 33.7 g/dL (ref 30.0–36.0)
MCV: 92.2 fL (ref 78.0–100.0)
Monocytes Absolute: 0.5 10*3/uL (ref 0.1–1.0)
Monocytes Relative: 8 % (ref 3–12)
RDW: 13.5 % (ref 11.5–15.5)

## 2012-11-12 LAB — POCT I-STAT, CHEM 8
BUN: 47 mg/dL — ABNORMAL HIGH (ref 6–23)
Creatinine, Ser: 2.4 mg/dL — ABNORMAL HIGH (ref 0.50–1.35)
Glucose, Bld: 136 mg/dL — ABNORMAL HIGH (ref 70–99)
Hemoglobin: 12.6 g/dL — ABNORMAL LOW (ref 13.0–17.0)
TCO2: 31 mmol/L (ref 0–100)

## 2012-11-12 MED ORDER — SODIUM CHLORIDE 0.9 % IV SOLN
INTRAVENOUS | Status: DC
  Administered 2012-11-12 – 2012-11-14 (×2): via INTRAVENOUS

## 2012-11-12 MED ORDER — HEPARIN SODIUM (PORCINE) 5000 UNIT/ML IJ SOLN: 5000.0000 [IU] | Freq: Three times a day (TID) | INTRAMUSCULAR | Status: DC

## 2012-11-12 MED ORDER — WARFARIN - PHARMACIST DOSING INPATIENT
Freq: Every day | Status: DC
  Administered 2012-11-13: 18:00:00

## 2012-11-12 MED ORDER — SODIUM CHLORIDE 0.9 % IV BOLUS (SEPSIS): 500.0000 mL | Freq: Once | INTRAVENOUS | Status: DC

## 2012-11-12 MED ORDER — ISOSORBIDE MONONITRATE ER 30 MG PO TB24
30.0000 mg | ORAL_TABLET | Freq: Every day | ORAL | Status: DC
  Administered 2012-11-13: 30 mg via ORAL
  Filled 2012-11-12: qty 1

## 2012-11-12 MED ORDER — SODIUM CHLORIDE 0.9 % IJ SOLN
3.0000 mL | Freq: Two times a day (BID) | INTRAMUSCULAR | Status: DC
  Administered 2012-11-12 – 2012-11-13 (×2): 3 mL via INTRAVENOUS

## 2012-11-12 MED ORDER — SODIUM CHLORIDE 0.9 % IV BOLUS (SEPSIS)
250.0000 mL | Freq: Once | INTRAVENOUS | Status: AC
  Administered 2012-11-12: 250 mL via INTRAVENOUS

## 2012-11-12 MED ORDER — CARVEDILOL 3.125 MG PO TABS
3.1250 mg | ORAL_TABLET | Freq: Two times a day (BID) | ORAL | Status: DC
  Administered 2012-11-13 – 2012-11-14 (×2): 3.125 mg via ORAL
  Filled 2012-11-12 (×5): qty 1

## 2012-11-12 MED ORDER — LEVOTHYROXINE SODIUM 100 MCG PO TABS
100.0000 ug | ORAL_TABLET | Freq: Two times a day (BID) | ORAL | Status: DC
  Administered 2012-11-13 – 2012-11-14 (×4): 100 ug via ORAL
  Filled 2012-11-12 (×8): qty 1

## 2012-11-12 MED ORDER — FENOFIBRATE 54 MG PO TABS
54.0000 mg | ORAL_TABLET | Freq: Every day | ORAL | Status: DC
  Administered 2012-11-13 – 2012-11-14 (×2): 54 mg via ORAL
  Filled 2012-11-12 (×3): qty 1

## 2012-11-12 NOTE — ED Provider Notes (Signed)
History     CSN: [id number]  Arrival date & time 11/12/12  1759   First MD Initiated Contact with Patient 11/12/12 1828     Chief Complaint  Patient presents with  . Dizziness   HPI  79 y/o male with history of CAD, Ischemic cardiomyopathy, CVA, CKD, HTN who presents from his PCP's office with cc of low blood pressure and dizziness. The patient was at his doctor's office in order to get his INR checked. While at his doctors office the aptient became dizzy. The patient's BP was noted to be 86/60 at his pcp's office. EMS was called. The patient denies any other symptoms. He states his dizziness has resolved.   Past Medical History  Diagnosis Date  . Coronary artery disease     a.  s/p MI;  b. s/p CABG 2007;  c. cath 12/08: LM occluded, pRCA 80%, dRCA 50%, PLB 90%, S-PDA ok, S-Dx ok with occluded dist Dx; L-LAD ok, S-OM ok with 90% after graft insertion . . . . distal OM treated with DES  . Ischemic cardiomyopathy     echo 6/12 EF 35-40%, apical, dAnt, mid to dist inf-lat HK, mild AI, mod MR, mod BAE, PASP 44  . Chronic combined systolic and diastolic heart failure     admx 8/13;   . Hypertension   . Atrial fibrillation     a. on coumadin.  . CKD (chronic kidney disease), stage IV   . CVA (cerebral infarction)     left frontal  . Anemia     chronic  . Hypothyroidism   . Leg muscle spasm   . GERD (gastroesophageal reflux disease)     Past Surgical History  Procedure Laterality Date  . Coronary artery bypass graft  2007  . Coronary angioplasty with stent placement      Family History  Problem Relation Age of Onset  . Cancer Mother   . Stroke Father     History  Substance Use Topics  . Smoking status: Never Smoker   . Smokeless tobacco: Not on file  . Alcohol Use: No    Review of Systems  Constitutional: Negative for fever and chills.  HENT: Negative for congestion and rhinorrhea.   Respiratory: Negative for cough and shortness of breath.   Cardiovascular:  Negative for chest pain.  Gastrointestinal: Negative for nausea, vomiting and abdominal pain.  Neurological: Positive for dizziness (resolved). Negative for weakness, numbness and headaches.  All other systems reviewed and are negative.    Allergies  Ace inhibitors  Home Medications   Current Outpatient Rx  Name  Route  Sig  Dispense  Refill  . carvedilol (COREG) 3.125 MG tablet   Oral   Take 3.125 mg by mouth 2 (two) times daily with a meal.         . fenofibrate 54 MG tablet   Oral   Take 1 tablet (54 mg total) by mouth daily.   30 tablet   6   . isosorbide mononitrate (IMDUR) 30 MG 24 hr tablet   Oral   Take 1 tablet (30 mg total) by mouth daily.   30 tablet   6   . levothyroxine (SYNTHROID, LEVOTHROID) 100 MCG tablet   Oral   Take 1 tablet (100 mcg total) by mouth 2 (two) times daily.   60 tablet   11   . nitroGLYCERIN (NITROSTAT) 0.4 MG SL tablet   Sublingual   Place 0.4 mg under the tongue every 5 (five) minutes as  needed for chest pain.         Marland Kitchen warfarin (COUMADIN) 2.5 MG tablet   Oral   Take 2.5-5 mg by mouth daily. Pt takes 2.5 mg every day except on mon pt takes 5mg            BP 110/59  Pulse 78  Temp(Src) 98 F (36.7 C) (Oral)  Resp 16  SpO2 100%  Physical Exam  Nursing note and vitals reviewed. Constitutional: He is oriented to person, place, and time. He appears well-developed and well-nourished. No distress.  HENT:  Head: Normocephalic and atraumatic.  Mouth/Throat: Mucous membranes are dry.  Eyes: Conjunctivae and EOM are normal. Pupils are equal, round, and reactive to light.  Neck: Normal range of motion. Neck supple.  Cardiovascular: Normal rate.  An irregularly irregular rhythm present. Exam reveals no gallop and no friction rub.   No murmur heard. Pulmonary/Chest: Effort normal and breath sounds normal.  Abdominal: Soft. He exhibits no distension. There is no tenderness.  Musculoskeletal: Normal range of motion. He exhibits  no edema and no tenderness.  Neurological: He is alert and oriented to person, place, and time. He has normal strength and normal reflexes. No cranial nerve deficit or sensory deficit. GCS eye subscore is 4. GCS verbal subscore is 5. GCS motor subscore is 6.  Skin: Skin is warm and dry.  Psychiatric: He has a normal mood and affect.    ED Course  Procedures (including critical care time)  Labs Reviewed  CBC WITH DIFFERENTIAL - Abnormal; Notable for the following:    RBC 3.83 (*)    Hemoglobin 11.9 (*)    HCT 35.3 (*)    Platelets 122 (*)    All other components within normal limits  PROTIME-INR - Abnormal; Notable for the following:    Prothrombin Time 29.1 (*)    INR 2.94 (*)    All other components within normal limits  POCT I-STAT, CHEM 8 - Abnormal; Notable for the following:    BUN 47 (*)    Creatinine, Ser 2.40 (*)    Glucose, Bld 136 (*)    Hemoglobin 12.6 (*)    HCT 37.0 (*)    All other components within normal limits  URINE CULTURE  URINALYSIS, ROUTINE W REFLEX MICROSCOPIC  CBC  BASIC METABOLIC PANEL  PROTIME-INR  POCT I-STAT TROPONIN I   Dg Chest 1 View  11/12/2012  *RADIOLOGY REPORT*  Clinical Data: 79 year old male with dizziness and confusion.  CHEST - 1 VIEW  Comparison: 10/13/2012 and prior chest radiographs dating back to 2006  Findings: Cardiomegaly and CABG noted. There is no evidence of focal airspace disease, pulmonary edema, suspicious pulmonary nodule/mass, pleural effusion, or pneumothorax. No acute bony abnormalities are identified.  IMPRESSION: Cardiomegaly without evidence of acute cardiopulmonary disease.   Original Report Authenticated By: Harmon Pier, M.D.      1. Orthostatic hypotension   2. Chronic combined systolic and diastolic heart failure   3. CKD (chronic kidney disease), stage IV      Date: 11/12/2012  Rate: 96  Rhythm: atrial fibrillation  QRS Axis: normal  Intervals: unable to determine  ST/T Wave abnormalities: nonspecific ST  changes  Conduction Disutrbances:none  Narrative Interpretation:   Old EKG Reviewed: unchanged  MDM   79 y/o male with history of CAD, Ischemic cardiomyopathy, CVA, CKD, HTN who presents from his PCP's office with cc of low blood pressure and dizziness. Has been followed by his pcp recently for general weight decline and failure to  thrive. Appears dry on exam. The patient was orthostatic initially. The patient was given a 250 cc bolus of NS with improvement in blood pressure. Labs are remarkable for CKD. INR WNL. Hemoglobin stable. CXR without acute pathology. Troponin negative. Given no acute EKG ischemic changes, negative troponin, and no chest pain feel ACS is unlikely. Likely just hypovolemia from FTT and possible diuretic use. The patient's daughter states the patient was no longer supposed to be taking torsemide. Today she found a torsemide bottle in the house. The patient's wife is in charge of the medications and told the daughter she doesn't believe she is giving him the torsemide. The patient's daughter is concerned that her parents are no longer capable of taking care of themselves without any help but have refused any help inside the home. Social work consulted for options regarding the possibility of home health. Despite IVF rehydration the patient had persistent orthostatic hypotension. Medicine was consulted for admission for continued rehydration and further evaluation of failure to thrive.          Shanon Ace, MD 11/12/12 2325

## 2012-11-12 NOTE — ED Notes (Signed)
Ambulated patient.  He walked out of the room and started experiencing dizziness.  Pt sat down in wheel chair and a blood pressure was taken.  It was 74/36.  Patient was returned to room via wheelchair with daughter and nurse accompanying pt.

## 2012-11-12 NOTE — ED Notes (Signed)
Pt had gone into doctor's office for PT/INR check and became dizzy. Staff checked his BP lying and it was 86/60. EMS check was 112/90. Pt denies any pain only feels weak. EMS states he was pale. SR on the monitor with RBBB. HR 100 and #20g RW.

## 2012-11-12 NOTE — H&P (Signed)
[facility] History and Physical  [name] KANT Sr. JXB:[id number] DOB: 12-01-1919 DOA: 11/12/2012  Referring physician: ED PCP: Sanda Linger, MD  Specialists: [facility]  Chief Complaint: Dizziness  HPI: [name] Mans Sr. is a 79 y.o. male who presents from his PCPs office with CC of low BP and dizziness.  Patient was at his doctors office to get his INR checked today when he became dizzy after standing up to get out of car.  BP was noted to be 86/60 in PCPs office.  EMS was called and patient was taken to ED.  In ED diagnosis of orthostatic hypotension was confirmed with BPs of 121/61 lying down, 110/57 seated, that dropped to 76/48 when standing.  Patient was given 500cc bolus of fluid, which was insufficient to resolve this.  Unfortunately it seems that the patients hypotension may be due to medication error at home (specifically he may have been taking toresemide when he was supposed to have stopped this med at home per his daughter).  Additionally there are concerns regarding the need for in home healthcare and ability of him and his wife to take care of themselves per daughter (wife has been resistant to allowing anyone in the home).  As a result he will be admitted for observation, fluids, and SW consultation.  Review of Systems: 12 systems reviewed and otherwise negative.  Past Medical History  Diagnosis Date  . Coronary artery disease     a.  s/p MI;  b. s/p CABG 2007;  c. cath 12/08: LM occluded, pRCA 80%, dRCA 50%, PLB 90%, S-PDA ok, S-Dx ok with occluded dist Dx; L-LAD ok, S-OM ok with 90% after graft insertion . . . . distal OM treated with DES  . Ischemic cardiomyopathy     echo 6/12 EF 35-40%, apical, dAnt, mid to dist inf-lat HK, mild AI, mod MR, mod BAE, PASP 44  . Chronic combined systolic and diastolic heart failure     admx 8/13;   . Hypertension   . Atrial fibrillation     a. on coumadin.  . CKD (chronic kidney disease), stage IV   . CVA (cerebral  infarction)     left frontal  . Anemia     chronic  . Hypothyroidism   . Leg muscle spasm   . GERD (gastroesophageal reflux disease)    Past Surgical History  Procedure Laterality Date  . Coronary artery bypass graft  2007  . Coronary angioplasty with stent placement     Social History:  reports that he has never smoked. He does not have any smokeless tobacco history on file. He reports that he does not drink alcohol or use illicit drugs.   Allergies  Allergen Reactions  . Ace Inhibitors     Cannot remember the reaction    Family History  Problem Relation Age of Onset  . Cancer Mother   . Stroke Father     Prior to Admission medications   Medication Sig Start Date End Date Taking? Authorizing Provider  carvedilol (COREG) 3.125 MG tablet Take 3.125 mg by mouth 2 (two) times daily with a meal.   Yes Historical Provider, MD  fenofibrate 54 MG tablet Take 1 tablet (54 mg total) by mouth daily. 09/29/12  Yes Beatrice Lecher, PA-C  isosorbide mononitrate (IMDUR) 30 MG 24 hr tablet Take 1 tablet (30 mg total) by mouth daily. 04/25/12 04/25/13 Yes Ok Anis, NP  levothyroxine (SYNTHROID, LEVOTHROID) 100 MCG tablet Take 1 tablet (100 mcg total)  by mouth 2 (two) times daily. 10/14/12  Yes Beatrice Lecher, PA-C  nitroGLYCERIN (NITROSTAT) 0.4 MG SL tablet Place 0.4 mg under the tongue every 5 (five) minutes as needed for chest pain.   Yes Historical Provider, MD  warfarin (COUMADIN) 2.5 MG tablet Take 2.5-5 mg by mouth daily. Pt takes 2.5 mg every day except on mon pt takes 5mg  07/12/12  Yes Rollene Rotunda, MD   Physical Exam: Filed Vitals:   11/12/12 2024 11/12/12 2031 11/12/12 2047 11/12/12 2100  BP: 105/55 74/36 121/69 110/59  Pulse: 85  82 78  Temp:      TempSrc:      Resp: 18  14 16   SpO2: 100%  92% 100%     General:  NAD, resting comfortably in bed  Eyes: PEERLA EOMI  ENT: mucous membranes moist  Neck: supple w/o JVD  Cardiovascular: RRR w/o MRG  Respiratory:  CTA B  Abdomen: soft, nt, nd, bs+  Skin: no rash nor lesion  Musculoskeletal: MAE, full ROM all 4 extremities  Psychiatric: normal tone and affect  Neurologic: AAOx3, grossly non-focal   Labs on Admission:  Basic Metabolic Panel:  Recent Labs Lab 11/12/12 1856  NA 143  K 4.1  CL 102  GLUCOSE 136*  BUN 47*  CREATININE 2.40*   Liver Function Tests: No results found for this basename: AST, ALT, ALKPHOS, BILITOT, PROT, ALBUMIN,  in the last 168 hours No results found for this basename: LIPASE, AMYLASE,  in the last 168 hours No results found for this basename: AMMONIA,  in the last 168 hours CBC:  Recent Labs Lab 11/12/12 1830 11/12/12 1856  WBC 6.8  --   NEUTROABS 4.5  --   HGB 11.9* 12.6*  HCT 35.3* 37.0*  MCV 92.2  --   PLT 122*  --    Cardiac Enzymes: No results found for this basename: CKTOTAL, CKMB, CKMBINDEX, TROPONINI,  in the last 168 hours  BNP (last 3 results)  Recent Labs  04/23/12 1834  PROBNP 10197.0*   CBG: No results found for this basename: GLUCAP,  in the last 168 hours  Radiological Exams on Admission: Dg Chest 1 View  11/12/2012  *RADIOLOGY REPORT*  Clinical Data: 79 year old male with dizziness and confusion.  CHEST - 1 VIEW  Comparison: 10/13/2012 and prior chest radiographs dating back to 2006  Findings: Cardiomegaly and CABG noted. There is no evidence of focal airspace disease, pulmonary edema, suspicious pulmonary nodule/mass, pleural effusion, or pneumothorax. No acute bony abnormalities are identified.  IMPRESSION: Cardiomegaly without evidence of acute cardiopulmonary disease.   Original Report Authenticated By: Harmon Pier, M.D.     EKG: Independently reviewed.  Assessment/Plan Principal Problem:   Orthostatic hypotension Active Problems:   Chronic systolic heart failure   RENAL INSUFFICIENCY, CHRONIC   1. Orthostatic hypotension - felt to be due to probable medication error at this point (taking torsemide when he was  supposed to have stopped this), hydrating gently with NS at 75 cc/hr, got 750cc in bolus already in ED, will leave his other home meds alone at the moment because this is felt to be due to error regarding the torsemide.  SW consult regarding home health has been placed.  Needs to be discussion between patient, daughter, and mother regarding this. 2. Chronic renal insufficiency - creatinine at baseline, continue to monitor 3. H/o HTN - continue home meds 4. Chronic systolic CHF - chronic and stable at this time, will keep patient on tele monitor while giving fluids  for #1.    Code Status: Full Code (must indicate code status--if unknown or must be presumed, indicate so) Family Communication: Spoke with daughter at bedside (indicate person spoken with, if applicable, with phone number if by telephone) Disposition Plan: Admit to obs (indicate anticipated LOS)  Time spent: 50 min  Hildreth Robart M. [facility] Pager [phone]  If 7PM-7AM, please contact night-coverage [email or web address] Password [facility] 11/12/2012, 10:06 PM

## 2012-11-12 NOTE — Progress Notes (Signed)
ANTICOAGULATION CONSULT NOTE - Initial Consult  Pharmacy Consult for Coumadin Indication: hx Afib, CVA  Allergies  Allergen Reactions  . Ace Inhibitors     Cannot remember the reaction    Patient Measurements:   Heparin Dosing Weight:   Vital Signs: Temp: 98 F (36.7 C) (03/19 1802) Temp src: Oral (03/19 1802) BP: 110/59 mmHg (03/19 2100) Pulse Rate: 78 (03/19 2100)  Labs:  Recent Labs  11/12/12 1635 11/12/12 1830 11/12/12 1856  HGB  --  11.9* 12.6*  HCT  --  35.3* 37.0*  PLT  --  122*  --   LABPROT  --  29.1*  --   INR 3.1 2.94*  --   CREATININE  --   --  2.40*    The CrCl is unknown because both a height and weight (above a minimum accepted value) are required for this calculation.   Medical History: Past Medical History  Diagnosis Date  . Coronary artery disease     a.  s/p MI;  b. s/p CABG 2007;  c. cath 12/08: LM occluded, pRCA 80%, dRCA 50%, PLB 90%, S-PDA ok, S-Dx ok with occluded dist Dx; L-LAD ok, S-OM ok with 90% after graft insertion . . . . distal OM treated with DES  . Ischemic cardiomyopathy     echo 6/12 EF 35-40%, apical, dAnt, mid to dist inf-lat HK, mild AI, mod MR, mod BAE, PASP 44  . Chronic combined systolic and diastolic heart failure     admx 8/13;   . Hypertension   . Atrial fibrillation     a. on coumadin.  . CKD (chronic kidney disease), stage IV   . CVA (cerebral infarction)     left frontal  . Anemia     chronic  . Hypothyroidism   . Leg muscle spasm   . GERD (gastroesophageal reflux disease)     Medications:  See electronic med rec  Assessment: 89yom to continue Coumadin for hx Afib/CVA. Patient was seen at [facility] and INR was 3.1 (INR 2.94 upon admission). Coumadin regimen was changed from 2.5mg  daily except 5mg  Mon/Fri to 2.5mg  daily except 5mg  on Mondays. Patient has taken dose today per wife. - H/H wnl, Plts low - No significant bleeding reported per patient  Goal of Therapy:  INR 2-3   Plan:  1. No  Coumadin tonight - plan to restart new regimen tomorrow 2. DC sq heparin 3. Daily INR  Cleon Dew [phone] 11/12/2012,10:32 PM

## 2012-11-12 NOTE — ED Notes (Signed)
Dr. Bringolf at the bedside.  

## 2012-11-13 ENCOUNTER — Encounter (HOSPITAL_COMMUNITY): Payer: Self-pay | Admitting: General Practice

## 2012-11-13 DIAGNOSIS — I5022 Chronic systolic (congestive) heart failure: Secondary | ICD-10-CM

## 2012-11-13 DIAGNOSIS — N189 Chronic kidney disease, unspecified: Secondary | ICD-10-CM

## 2012-11-13 DIAGNOSIS — I4891 Unspecified atrial fibrillation: Secondary | ICD-10-CM

## 2012-11-13 LAB — URINALYSIS, ROUTINE W REFLEX MICROSCOPIC
Bilirubin Urine: NEGATIVE
Glucose, UA: NEGATIVE mg/dL
Ketones, ur: NEGATIVE mg/dL
Leukocytes, UA: NEGATIVE
Nitrite: NEGATIVE
Specific Gravity, Urine: 1.013 (ref 1.005–1.030)
pH: 6 (ref 5.0–8.0)

## 2012-11-13 LAB — CBC
Hemoglobin: 10.1 g/dL — ABNORMAL LOW (ref 13.0–17.0)
MCHC: 33.8 g/dL (ref 30.0–36.0)
RBC: 3.25 MIL/uL — ABNORMAL LOW (ref 4.22–5.81)
WBC: 5.6 10*3/uL (ref 4.0–10.5)

## 2012-11-13 LAB — BASIC METABOLIC PANEL
CO2: 29 mEq/L (ref 19–32)
Chloride: 107 mEq/L (ref 96–112)
GFR calc non Af Amer: 22 mL/min — ABNORMAL LOW (ref >90)
Glucose, Bld: 92 mg/dL (ref 70–99)
Potassium: 4 mEq/L (ref 3.5–5.1)
Sodium: 144 mEq/L (ref 135–145)

## 2012-11-13 LAB — PROTIME-INR: INR: 3.06 — ABNORMAL HIGH (ref 0.00–1.49)

## 2012-11-13 MED ORDER — WARFARIN SODIUM 2.5 MG PO TABS
2.5000 mg | ORAL_TABLET | Freq: Once | ORAL | Status: AC
  Administered 2012-11-13: 2.5 mg via ORAL
  Filled 2012-11-13: qty 1

## 2012-11-13 MED ORDER — SODIUM CHLORIDE 0.9 % IV BOLUS (SEPSIS)
500.0000 mL | Freq: Once | INTRAVENOUS | Status: AC
  Administered 2012-11-13: 500 mL via INTRAVENOUS

## 2012-11-13 NOTE — Care Management Note (Signed)
    Page 1 of 1   11/13/2012     3:51:34 PM   CARE MANAGEMENT NOTE 11/13/2012  Patient:  [name], [name]   Account Number:  [id number]  Date Initiated:  11/13/2012  Documentation initiated by:  Donn Pierini  Subjective/Objective Assessment:   Pt admitted with hypotension/dizziness     Action/Plan:   PTA pt lived at home with spouse   Anticipated DC Date:  11/14/2012   Anticipated DC Plan:  HOME/SELF CARE      DC Planning Services  CM consult  Outpatient Services - Pt will follow up      [facility] Choice  NA   Choice offered to / List presented to:             Status of service:  In process, will continue to follow Medicare Important Message given?   (If response is "NO", the following Medicare IM given date fields will be blank) Date Medicare IM given:   Date Additional Medicare IM given:    Discharge Disposition:    Per UR Regulation:  Reviewed for med. necessity/level of care/duration of stay  If discussed at Long Length of Stay Meetings, dates discussed:    Comments:  11/13/12- 1530- Donn Pierini RN, BSN [phone] Spoke with pt and daughter Verlon Au at bedside- per conversation pt lives at home with spouse- there is some concern about medication compliance at home- and per daughter she as spoken with her parents regarding  getting HH in the home to assist- but she states that he mother is adamant about not having anyone come into the home. Pt has been trying to make it to outpt PT to be assessed and daughter would like to keep upcoming appointment for this Monday 3/24 with outpt PT (MD is to give pt script for outpt PT at discharge) Per pt and daughter pt has both rollator walker and cane at home and at this time does not feel like there is need for any other DME. Daughter will be one to take pt home at discharge and checks on pt frequently at home to assist.

## 2012-11-13 NOTE — Progress Notes (Signed)
[facility] PROGRESS NOTE  BRANDLEY ALDRETE Sr. ZOX:[id number] DOB: 06/01/1920 DOA: 11/12/2012 PCP: Sanda Linger, MD  Assessment/Plan:  1. Orthostatic hypotension  Secondary to dehydration probably secondary to increased use  Of torsemide. Torsemide and imdur are held for hypotension and he was given fluid boluses and on maintenance fluids. -  SW consult regarding home health has been placed. Needs to be discussion between patient, daughter, and mother regarding this. 2. Chronic renal insufficiency - creatinine at baseline, continue to monitor 3. H/o HTN - continue home meds 4. Chronic systolic CHF - chronic and stable at this time, will keep patient on tele monitor while giving fluids for #1.    HPI/Subjective: No dizziness.  Objective: Filed Vitals:   11/12/12 2326 11/12/12 2330 11/12/12 2355 11/13/12 0500  BP: 104/51 105/48 120/55 103/53  Pulse: 79 81 82 93  Temp:   97.8 F (36.6 C) 98 F (36.7 C)  TempSrc:   Oral Oral  Resp:   16 16  Height:   5\' 11"  (1.803 m)   Weight:   62.823 kg (138 lb 8 oz) 62.7 kg (138 lb 3.7 oz)  SpO2: 100% 100% 96% 99%    Intake/Output Summary (Last 24 hours) at 11/13/12 0811 Last data filed at 11/12/12 2341  Gross per 24 hour  Intake      3 ml  Output      0 ml  Net      3 ml   Filed Weights   11/12/12 2355 11/13/12 0500  Weight: 62.823 kg (138 lb 8 oz) 62.7 kg (138 lb 3.7 oz)    Exam:  General: NAD, comfortable Cardiovascular: RRR w/o MRG  Respiratory: CTA B  Abdomen: soft, nt, nd, bs+  Skin: no rash nor lesion  Musculoskeletal: MAE, full ROM all 4 extremities  Psychiatric: normal tone and affect  Neurologic: AAOx3, grossly non-focal    Data Reviewed: Basic Metabolic Panel:  Recent Labs Lab 11/12/12 1856 11/13/12 0446  NA 143 144  K 4.1 4.0  CL 102 107  CO2  --  29  GLUCOSE 136* 92  BUN 47* 45*  CREATININE 2.40* 2.40*  CALCIUM  --  8.7   Liver Function Tests: No results found for this basename: AST, ALT,  ALKPHOS, BILITOT, PROT, ALBUMIN,  in the last 168 hours No results found for this basename: LIPASE, AMYLASE,  in the last 168 hours No results found for this basename: AMMONIA,  in the last 168 hours CBC:  Recent Labs Lab 11/12/12 1830 11/12/12 1856 11/13/12 0446  WBC 6.8  --  5.6  NEUTROABS 4.5  --   --   HGB 11.9* 12.6* 10.1*  HCT 35.3* 37.0* 29.9*  MCV 92.2  --  92.0  PLT 122*  --  104*   Cardiac Enzymes: No results found for this basename: CKTOTAL, CKMB, CKMBINDEX, TROPONINI,  in the last 168 hours BNP (last 3 results)  Recent Labs  04/23/12 1834  PROBNP 10197.0*   CBG: No results found for this basename: GLUCAP,  in the last 168 hours  No results found for this or any previous visit (from the past 240 hour(s)).   Studies: Dg Chest 1 View  11/12/2012  *RADIOLOGY REPORT*  Clinical Data: 79 year old male with dizziness and confusion.  CHEST - 1 VIEW  Comparison: 10/13/2012 and prior chest radiographs dating back to 2006  Findings: Cardiomegaly and CABG noted. There is no evidence of focal airspace disease, pulmonary edema, suspicious pulmonary nodule/mass, pleural effusion, or pneumothorax. No  acute bony abnormalities are identified.  IMPRESSION: Cardiomegaly without evidence of acute cardiopulmonary disease.   Original Report Authenticated By: Harmon Pier, M.D.     Scheduled Meds: . carvedilol  3.125 mg Oral BID WC  . fenofibrate  54 mg Oral Daily  . isosorbide mononitrate  30 mg Oral Daily  . levothyroxine  100 mcg Oral BID  . sodium chloride  3 mL Intravenous Q12H  . Warfarin - Pharmacist Dosing Inpatient   Does not apply q1800   Continuous Infusions: . sodium chloride 75 mL/hr at 11/12/12 2342    Principal Problem:   Orthostatic hypotension Active Problems:   Chronic systolic heart failure   RENAL INSUFFICIENCY, CHRONIC        Donia Yokum  [facility] Pager [phone]. If 7PM-7AM, please contact night-coverage at [email or web address], password  [facility] 11/13/2012, 8:11 AM  LOS: 1 day

## 2012-11-13 NOTE — Progress Notes (Signed)
ANTICOAGULATION CONSULT NOTE - Follow Up Consult  Pharmacy Consult for Warfarin Indication: Hx Afib/CVA  Allergies  Allergen Reactions  . Ace Inhibitors     Cannot remember the reaction    Patient Measurements: Height: 5\' 11"  (180.3 cm) Weight: 138 lb 3.7 oz (62.7 kg) IBW/kg (Calculated) : 75.3  Vital Signs: Temp: 98 F (36.7 C) (03/20 0500) Temp src: Oral (03/20 0500) BP: 104/50 mmHg (03/20 1022) Pulse Rate: 82 (03/20 1022)  Labs:  Recent Labs  11/12/12 1635  11/12/12 1830 11/12/12 1856 11/13/12 0446  HGB  --   < > 11.9* 12.6* 10.1*  HCT  --   --  35.3* 37.0* 29.9*  PLT  --   --  122*  --  104*  LABPROT  --   --  29.1*  --  30.0*  INR 3.1  --  2.94*  --  3.06*  CREATININE  --   --   --  2.40* 2.40*  < > = values in this interval not displayed.  Estimated Creatinine Clearance: 18.1 ml/min (by C-G formula based on Cr of 2.4).   Assessment: 79 y.o. M on warfarin for hx Afib/CVA with a slightly SUPRAtherapeutic INR this morning (INR 3.06 << 2.94, goal of 2-3). The patient was seen at the anti-coag clinic on 3/19 and the dose was reduced to 2.5 mg daily EXCEPT for 5 mg on Mondays only. The patient had already taken his dose on 3/19 prior to admission. Will continue with current dosing for now and monitor INR trends. Hgb/Hct/Plt slight drop -- no overt s/sx of bleeding noted.   Goal of Therapy:  INR 2-3   Plan:  1. Warfarin 2.5 mg x 1 dose at 1800 today 2. Will continue to monitor for any signs/symptoms of bleeding and will follow up with PT/INR in the a.m.   Georgina Pillion, PharmD, BCPS Clinical Pharmacist Pager: [phone] 11/13/2012 10:46 AM

## 2012-11-13 NOTE — ED Provider Notes (Signed)
Medical screening examination/treatment/procedure(s) were conducted as a shared visit with non-physician practitioner(s) and myself.  I personally evaluated the patient during the encounter.  Patient seen for weakness and found to be hypotensive. Patient was gently hydrated here in the ER but remained orthostatic. Because of the patient's previous history of cardiomyopathy, I did not feel that he can be aggressively hydrated and discharged. Patient was therefore admitted. Agree with present workup including EKG interpretation.  Gilda Crease, MD 11/13/12 1539

## 2012-11-13 NOTE — Progress Notes (Signed)
Utilization review completed.  

## 2012-11-13 NOTE — Evaluation (Signed)
Physical Therapy Evaluation Patient Details Name: [name] BIEGLER Sr. MRN: [id number] DOB: 1919/10/31 Today's Date: 11/13/2012 Time: 8295-6213 PT Time Calculation (min): 41 min  PT Assessment / Plan / Recommendation Clinical Impression  pt admitted with dizziness due to orthostatic hypotension.  On eval, his dizziness appears to have resolved, but some balance issues remain and pt could benefit from outpatient PT    PT Assessment  All further PT needs can be met in the next venue of care    Follow Up Recommendations  Outpatient PT    Does the patient have the potential to tolerate intense rehabilitation      Barriers to Discharge        Equipment Recommendations  None recommended by PT    Recommendations for Other Services     Frequency      Precautions / Restrictions Precautions Precautions: Fall   Pertinent Vitals/Pain       Mobility  Bed Mobility Bed Mobility: Supine to Sit;Sitting - Scoot to Edge of Bed Supine to Sit: 5: Supervision Sitting - Scoot to Edge of Bed: 5: Supervision Details for Bed Mobility Assistance: safe mobility Transfers Transfers: Sit to Stand;Stand to Sit Sit to Stand: 5: Supervision;With upper extremity assist;From bed Stand to Sit: 6: Modified independent (Device/Increase time);With upper extremity assist;To bed Details for Transfer Assistance: variable; sometimes use back of legs to support at other times does not need support,  no asssit needed Ambulation/Gait Ambulation/Gait Assistance: 4: Min guard Ambulation Distance (Feet): 240 Feet Assistive device: Straight cane Ambulation/Gait Assistance Details: functional use of cane; steady straight line gait that degrades with scanning and turns Gait Pattern: Step-through pattern Gait velocity: able to vary speed, but slower    Exercises     PT Diagnosis: Difficulty walking;Generalized weakness  PT Problem List: Decreased strength;Decreased balance;Decreased activity tolerance PT  Treatment Interventions:     PT Goals    Visit Information  Last PT Received On: 11/13/12 Assistance Needed: +1    Subjective Data  Subjective: My wife goes overboard (holding to him when up) but I promised her I would walk without her help Patient Stated Goal: To be able to be more steady on my feet and confident   Prior Functioning  Home Living Lives With: Spouse Available Help at Discharge: Family;Other (Comment) (wife and daughter) Type of Home: House Home Access: Stairs to enter Secretary/administrator of Steps: 4 Entrance Stairs-Rails: Right;Left Home Layout: One level Bathroom Shower/Tub: Engineer, manufacturing systems: Standard Home Adaptive Equipment: Straight cane;Walker - rolling Prior Function Level of Independence: Needs assistance Needs Assistance: Gait;Transfers Driving: No Communication Communication: No difficulties    Cognition  Cognition Overall Cognitive Status: Appears within functional limits for tasks assessed/performed Arousal/Alertness: Awake/alert Orientation Level: Appears intact for tasks assessed Behavior During Session: [facility] for tasks performed    Extremity/Trunk Assessment Right Upper Extremity Assessment RUE ROM/Strength/Tone: Within functional levels RUE Sensation: WFL - Light Touch RUE Coordination: WFL - gross/fine motor Left Upper Extremity Assessment LUE ROM/Strength/Tone: Within functional levels LUE Sensation: WFL - Light Touch LUE Coordination: WFL - gross/fine motor Right Lower Extremity Assessment RLE ROM/Strength/Tone: Within functional levels (mild hip flexor weakness bil) RLE Sensation: WFL - Light Touch RLE Coordination: WFL - gross/fine motor Left Lower Extremity Assessment LLE ROM/Strength/Tone: Within functional levels LLE Sensation: WFL - Light Touch LLE Coordination: WFL - gross/fine motor   Balance Balance Balance Assessed: Yes Static Sitting Balance Static Sitting - Balance Support: Feet supported;No upper  extremity supported Static Sitting - Level of  Assistance: 7: Independent Static Standing Balance Static Standing - Balance Support: During functional activity;Right upper extremity supported Static Standing - Level of Assistance: 5: Stand by assistance Dynamic Standing Balance Dynamic Standing - Balance Support: During functional activity;Right upper extremity supported (scanning produced instability and need for min assist)  End of Session PT - End of Session Activity Tolerance: Patient tolerated treatment well Patient left: Other (comment);with nursing in room (standing EOB with daughter) Nurse Communication: Mobility status  GP Functional Assessment Tool Used: clinical judgement Functional Limitation: Mobility: Walking and moving around Mobility: Walking and Moving Around Current Status (N5621): At least 1 percent but less than 20 percent impaired, limited or restricted Mobility: Walking and Moving Around Goal Status [id number]): At least 1 percent but less than 20 percent impaired, limited or restricted Mobility: Walking and Moving Around Discharge Status [id number]): At least 1 percent but less than 20 percent impaired, limited or restricted   [name], Eliseo Gum 11/13/2012, 4:45 PM 11/13/2012  [place] Bing, PT [phone] (pager)

## 2012-11-14 LAB — PROTIME-INR
INR: 3.45 — ABNORMAL HIGH (ref 0.00–1.49)
Prothrombin Time: 32.8 seconds — ABNORMAL HIGH (ref 11.6–15.2)

## 2012-11-14 LAB — CBC
MCH: 30.6 pg (ref 26.0–34.0)
Platelets: 123 10*3/uL — ABNORMAL LOW (ref 150–400)
RBC: 3.6 MIL/uL — ABNORMAL LOW (ref 4.22–5.81)

## 2012-11-14 LAB — BASIC METABOLIC PANEL
CO2: 27 mEq/L (ref 19–32)
Calcium: 8.9 mg/dL (ref 8.4–10.5)
GFR calc non Af Amer: 31 mL/min — ABNORMAL LOW (ref >90)
Sodium: 144 mEq/L (ref 135–145)

## 2012-11-14 LAB — TROPONIN I: Troponin I: <0.3 ng/mL (ref <0.30)

## 2012-11-14 MED ORDER — CARVEDILOL 3.125 MG PO TABS
1.5600 mg | ORAL_TABLET | Freq: Two times a day (BID) | ORAL | Status: DC
  Administered 2012-11-14: 1.56 mg via ORAL
  Filled 2012-11-14 (×4): qty 0.5

## 2012-11-14 NOTE — Progress Notes (Signed)
[facility] PROGRESS NOTE  [name] Sr. ZOX:[id number] DOB: 1920/04/29 DOA: 11/12/2012 PCP: [name] Linger, MD  Assessment/Plan:  1. Orthostatic hypotension  Secondary to dehydration probably secondary to increased use  Of torsemide. Torsemide and imdur are held for hypotension and he was given fluid boluses and on maintenance fluids. -  SW consult regarding home health has been placed. Needs to be discussion between patient, daughter, and mother regarding this. 2. Chronic renal insufficiency - creatinine at baseline, continue to monitor 3. H/o HTN - continue home meds 4. Chronic systolic CHF - chronic and stable at this time, will keep patient on tele monitor while giving fluids for #1.    HPI/Subjective: No dizziness.  Objective: Filed Vitals:   11/14/12 1022 11/14/12 1026 11/14/12 1330 11/14/12 1858  BP: 101/65 107/39 124/49 120/50  Pulse: 105 94 68 62  Temp:   97.3 F (36.3 C)   TempSrc:   Oral   Resp:   18   Height:      Weight:      SpO2:   97%     Intake/Output Summary (Last 24 hours) at 11/14/12 1907 Last data filed at 11/14/12 1439  Gross per 24 hour  Intake    390 ml  Output      0 ml  Net    390 ml   Filed Weights   11/12/12 2355 11/13/12 0500 11/14/12 0422  Weight: 62.823 kg (138 lb 8 oz) 62.7 kg (138 lb 3.7 oz) 62.778 kg (138 lb 6.4 oz)    Exam:  General: NAD, comfortable Cardiovascular: RRR w/o MRG  Respiratory: CTA B  Abdomen: soft, nt, nd, bs+  Skin: no rash nor lesion  Musculoskeletal: MAE, full ROM all 4 extremities  Psychiatric: normal tone and affect  Neurologic: AAOx3, grossly non-focal    Data Reviewed: Basic Metabolic Panel:  Recent Labs Lab 11/12/12 1856 11/13/12 0446 11/14/12 0945  NA 143 144 144  K 4.1 4.0 4.4  CL 102 107 108  CO2  --  29 27  GLUCOSE 136* 92 98  BUN 47* 45* 35*  CREATININE 2.40* 2.40* 1.81*  CALCIUM  --  8.7 8.9   Liver Function Tests: No results found for this basename: AST, ALT, ALKPHOS,  BILITOT, PROT, ALBUMIN,  in the last 168 hours No results found for this basename: LIPASE, AMYLASE,  in the last 168 hours No results found for this basename: AMMONIA,  in the last 168 hours CBC:  Recent Labs Lab 11/12/12 1830 11/12/12 1856 11/13/12 0446 11/14/12 0945  WBC 6.8  --  5.6 6.4  NEUTROABS 4.5  --   --   --   HGB 11.9* 12.6* 10.1* 11.0*  HCT 35.3* 37.0* 29.9* 33.6*  MCV 92.2  --  92.0 93.3  PLT 122*  --  104* 123*   Cardiac Enzymes:  Recent Labs Lab 11/14/12 1334  TROPONINI <0.30   BNP (last 3 results)  Recent Labs  04/23/12 1834  PROBNP 10197.0*   CBG: No results found for this basename: GLUCAP,  in the last 168 hours  No results found for this or any previous visit (from the past 240 hour(s)).   Studies: No results found.  Scheduled Meds: . carvedilol  1.56 mg Oral BID WC  . fenofibrate  54 mg Oral Daily  . levothyroxine  100 mcg Oral BID  . sodium chloride  3 mL Intravenous Q12H  . Warfarin - Pharmacist Dosing Inpatient   Does not apply q1800   Continuous  Infusions: . sodium chloride 75 mL/hr at 11/14/12 1439    Principal Problem:   Orthostatic hypotension Active Problems:   Chronic systolic heart failure   RENAL INSUFFICIENCY, CHRONIC        [name]  [facility] Pager [phone]. If 7PM-7AM, please contact night-coverage at [email or web address], password [facility] 11/14/2012, 7:07 PM  LOS: 2 days

## 2012-11-14 NOTE — Progress Notes (Signed)
ANTICOAGULATION CONSULT NOTE - Follow Up Consult  Pharmacy Consult for Coumadin Indication: Hx Afib/CVA   Allergies  Allergen Reactions  . Ace Inhibitors     Cannot remember the reaction    Patient Measurements: Height: 5\' 11"  (180.3 cm) Weight: 138 lb 6.4 oz (62.778 kg) IBW/kg (Calculated) : 75.3   Vital Signs: Temp: 97.6 F (36.4 C) (03/21 0422) Temp src: Oral (03/21 0422) BP: 113/55 mmHg (03/21 0828) Pulse Rate: 83 (03/21 0828)  Labs:  Recent Labs  11/12/12 1830 11/12/12 1856 11/13/12 0446 11/14/12 0420  HGB 11.9* 12.6* 10.1*  --   HCT 35.3* 37.0* 29.9*  --   PLT 122*  --  104*  --   LABPROT 29.1*  --  30.0* 32.8*  INR 2.94*  --  3.06* 3.45*  CREATININE  --  2.40* 2.40*  --     Estimated Creatinine Clearance: 18.2 ml/min (by C-G formula based on Cr of 2.4).  Assessment: Patient is a 79 y.o M on coumadin for hx Afib/CVA.  INR increased from 3.06 to 3.45 today after one dose of 2.5mg  given last night.    Goal of Therapy:  INR 2-3   Plan:  1) hold coumadin dose today  Alayla Dethlefs P 11/14/2012,8:43 AM

## 2012-11-14 NOTE — Progress Notes (Signed)
CSW Connye Burkitt is signing off, this pt will be d/c today with HH set up by [facility].  Sherald Barge, LCSW-A Clinical Social Worker [phone]

## 2012-11-15 LAB — BASIC METABOLIC PANEL
BUN: 34 mg/dL — ABNORMAL HIGH (ref 6–23)
Creatinine, Ser: 1.73 mg/dL — ABNORMAL HIGH (ref 0.50–1.35)
GFR calc Af Amer: 38 mL/min — ABNORMAL LOW (ref >90)
GFR calc non Af Amer: 33 mL/min — ABNORMAL LOW (ref >90)
Potassium: 3.9 mEq/L (ref 3.5–5.1)

## 2012-11-15 LAB — URINE CULTURE

## 2012-11-15 LAB — PROTIME-INR: Prothrombin Time: 25.6 seconds — ABNORMAL HIGH (ref 11.6–15.2)

## 2012-11-15 MED ORDER — WARFARIN SODIUM 1 MG PO TABS
1.0000 mg | ORAL_TABLET | Freq: Once | ORAL | Status: DC
  Filled 2012-11-15: qty 1

## 2012-11-15 NOTE — Progress Notes (Signed)
Pt appeared to be slightly more confused this am being only oriented to person, and disoriented to place, time, and situation. Vitals signs T 98.0 HR 98 BP 115/64 . Dr on call notified and made aware no new orders obtained. Will continue to monitor pt.

## 2012-11-15 NOTE — Progress Notes (Signed)
ANTICOAGULATION CONSULT NOTE - Follow Up Consult  Pharmacy Consult for coumadin Indication: Hx Afib/CVA   Allergies  Allergen Reactions  . Ace Inhibitors     Cannot remember the reaction    Patient Measurements: Height: 5\' 11"  (180.3 cm) Weight: 138 lb 6.4 oz (62.778 kg) IBW/kg (Calculated) : 75.3  Vital Signs: Temp: 98 F (36.7 C) (03/22 0624) Temp src: Oral (03/22 0624) BP: 115/64 mmHg (03/22 0624) Pulse Rate: 98 (03/22 0624)  Labs:  Recent Labs  11/12/12 1830  11/12/12 1856 11/13/12 0446 11/14/12 0420 11/14/12 0945 11/14/12 1334 11/15/12 0430  HGB 11.9*  --  12.6* 10.1*  --  11.0*  --   --   HCT 35.3*  --  37.0* 29.9*  --  33.6*  --   --   PLT 122*  --   --  104*  --  123*  --   --   LABPROT 29.1*  --   --  30.0* 32.8*  --   --  25.6*  INR 2.94*  --   --  3.06* 3.45*  --   --  2.47*  CREATININE  --   < > 2.40* 2.40*  --  1.81*  --  1.73*  TROPONINI  --   --   --   --   --   --  <0.30  --   < > = values in this interval not displayed.  Estimated Creatinine Clearance: 25.2 ml/min (by C-G formula based on Cr of 1.73).  Assessment: Patient is a 79 y.o M on coumadin for hx Afib/CVA.  INR decreased from 3.45 to 2.47 today after dose held on 3/21.  Goal of Therapy:  INR 2-3    Plan:  1) coumadin 1 mg O x1 today  Koreen Lizaola P 11/15/2012,9:14 AM

## 2012-11-15 NOTE — Progress Notes (Signed)
Had spoken with patient and pt spouse to allow their daughter to follow-up his medication on a daily bases at the same time everyday to keep pt from multiple visits. Pt and wife is agreable to the plan. Ancil Linsey RN

## 2012-11-15 NOTE — Progress Notes (Signed)
UR Completed [place] Jeff Graves-Bigelow, RN,BSN [phone]  

## 2012-11-16 ENCOUNTER — Other Ambulatory Visit: Payer: Self-pay | Admitting: Cardiology

## 2012-11-17 ENCOUNTER — Ambulatory Visit: Payer: Medicare Other | Admitting: Physician Assistant

## 2012-11-17 ENCOUNTER — Telehealth: Payer: Self-pay | Admitting: Cardiology

## 2012-11-17 NOTE — Telephone Encounter (Signed)
Left message for Laurie  to call back.

## 2012-11-17 NOTE — Telephone Encounter (Signed)
Pt's dtr laurie page rtn call

## 2012-11-17 NOTE — Telephone Encounter (Signed)
Plz return call to patient dautgher Jacki Cones [phone]  CHF patient, released from hospital Saturday afternoon.  Patient was instructed to hold torsimeide upon release but is experiencing severe swelling of the feet since Sunday.   Plz return call to daughter who would like to bring patient in.

## 2012-11-17 NOTE — Telephone Encounter (Signed)
Left message for daughter to call back.  

## 2012-11-18 ENCOUNTER — Ambulatory Visit (INDEPENDENT_AMBULATORY_CARE_PROVIDER_SITE_OTHER): Payer: Medicare Other | Admitting: Physician Assistant

## 2012-11-18 ENCOUNTER — Encounter: Payer: Self-pay | Admitting: Physician Assistant

## 2012-11-18 VITALS — BP 118/60 | HR 87 | Ht 71.0 in | Wt 155.4 lb

## 2012-11-18 DIAGNOSIS — N189 Chronic kidney disease, unspecified: Secondary | ICD-10-CM

## 2012-11-18 DIAGNOSIS — I2510 Atherosclerotic heart disease of native coronary artery without angina pectoris: Secondary | ICD-10-CM

## 2012-11-18 DIAGNOSIS — I5042 Chronic combined systolic (congestive) and diastolic (congestive) heart failure: Secondary | ICD-10-CM

## 2012-11-18 DIAGNOSIS — I4891 Unspecified atrial fibrillation: Secondary | ICD-10-CM

## 2012-11-18 MED ORDER — WARFARIN SODIUM 2.5 MG PO TABS: ORAL_TABLET | ORAL | Status: DC

## 2012-11-18 MED ORDER — FUROSEMIDE 20 MG PO TABS: 20.0000 mg | ORAL_TABLET | Freq: Every day | ORAL | Status: DC

## 2012-11-18 NOTE — Progress Notes (Signed)
[address], [address] [place], [place]  [address] Phone: [phone], Fax:  [phone]  Date:  11/18/2012   ID:  [name] Mans Sr., DOB 06-12-1920, MRN [id number]  PCP:  Sanda Linger, MD  Primary Cardiologist:  Dr. Rollene Rotunda     History of Present Illness: [name] G Mclester Sr. is a 79 y.o. male who returns for f/u on CHF.  He has a hx of CAD, s/p CABG in 2007, s/p stent to the OM in 07/2007, combined systolic and diastolic CHF, CKD and permanent AFib on chronic Coumadin therapy.  He has been followed closely for volume overload over the past several weeks.  Recently his weight had dropped 20 lbs in 7 days.  He has a hx of non-compliance with his medications.  I was concerned that he had suddenly started to take his medications as prescribed.  I cut back on his Demadex.  Last seen by Dr. Rollene Rotunda 2/24.  He presented to the coumadin clinic 3/19 with c/o dizziness and was noted to be orthostatic.  He was sent to the ED and remained in the hospital for 2 days.  He was given IVFs.  Torsemide and Imdur were held.  He called in recently with increased LE edema.  His daughter is checking on him daily. She noticed that his ankles are more swollen. He denies worsening dyspnea. He denies syncope. Denies chest pain. He denies orthopnea or PND.  Labs (9/13):    K 3.3, Cr 2.7 Labs (10/13):  K 4.2, Cr 2.3 Labs (1/14):    K 5.1, Cr 1.9 Labs (2/14):    K 5 => 4.2, Cr 2.4 => 2.4 Labs (3/14):    K 3.9, Cr 2.4 => 1.73, Hgb 11, Plt 123K,   Wt Readings from Last 3 Encounters:  11/18/12 155 lb 6.4 oz (70.489 kg)  11/14/12 138 lb 6.4 oz (62.778 kg)  10/20/12 150 lb (68.04 kg)     Past Medical History  Diagnosis Date  . Coronary artery disease     a.  s/p MI;  b. s/p CABG 2007;  c. cath 12/08: LM occluded, pRCA 80%, dRCA 50%, PLB 90%, S-PDA ok, S-Dx ok with occluded dist Dx; L-LAD ok, S-OM ok with 90% after graft insertion . . . . distal OM treated with DES  . Ischemic  cardiomyopathy     echo 6/12 EF 35-40%, apical, dAnt, mid to dist inf-lat HK, mild AI, mod MR, mod BAE, PASP 44  . Chronic combined systolic and diastolic heart failure     admx 8/13;   . Hypertension   . Atrial fibrillation     a. on coumadin.  . CKD (chronic kidney disease), stage IV   . CVA (cerebral infarction)     left frontal  . Anemia     chronic  . Hypothyroidism   . Leg muscle spasm   . GERD (gastroesophageal reflux disease)     Current Outpatient Prescriptions  Medication Sig Dispense Refill  . carvedilol (COREG) 3.125 MG tablet Take 3.125 mg by mouth 2 (two) times daily with a meal.      . fenofibrate 54 MG tablet Take 1 tablet (54 mg total) by mouth daily.  30 tablet  6  . levothyroxine (SYNTHROID, LEVOTHROID) 100 MCG tablet Take 1 tablet (100 mcg total) by mouth 2 (two) times daily.  60 tablet  11  . nitroGLYCERIN (NITROSTAT) 0.4 MG SL tablet Place 0.4 mg under the tongue every 5 (five) minutes as  needed for chest pain.      Marland Kitchen warfarin (COUMADIN) 2.5 MG tablet TAKE AS DIRECTED BY ANTICOAGULATION CLINIC  40 tablet  3   No current facility-administered medications for this visit.    Allergies:    Allergies  Allergen Reactions  . Ace Inhibitors     Cannot remember the reaction    Social History:  The patient  reports that he has never smoked. He has never used smokeless tobacco. He reports that he does not drink alcohol or use illicit drugs.   ROS:  Please see the history of present illness.    All other systems reviewed and negative.   PHYSICAL EXAM: VS:  BP 118/60  Pulse 87  Ht 5\' 11"  (1.803 m)  Wt 155 lb 6.4 oz (70.489 kg)  BMI 21.68 kg/m2  Well nourished, well developed, in no acute distress HEENT: normal Neck: no JVD Cardiac:  normal S1, S2; irregularly irregular; no murmur Lungs:  Clear to auscultation bilaterally, no wheezing, rhonchi or rales Abd: soft, nontender  Ext: trace to 1+ bilateral LE edema Skin: warm and dry; decreased turgor  noted Neuro:  CNs 2-12 intact, no focal abnormalities noted  ECG: Atrial fibrillation, HR 87  ASSESSMENT AND PLAN:  1. Chronic Combined Systolic and Diastolic CHF:  His volume has increased slightly since holding his diuretic and getting IV fluids in the hospital. Knowing his history, he should be on at least a low dose of diuretic. He took Lasix many years ago but was switched to torsemide for increased diuresis in the setting of decompensated CHF. I will place [name] Lasix 20 mg daily. Check a basic metabolic panel in one week. Close follow up with me.  2. Coronary Artery Disease:  No angina.  Continue current Rx. 3. Atrial Fibrillation:  Rate controlled.  He remains on coumadin. 4. Chronic Kidney Disease:  Check a basic metabolic panel in one week..   5. Hypothyroidism:  Recent TSH low and medications adjusted.  He has f/u with his PCP for further management.   6. Disposition:  Follow up with me in 2 weeks.  We discussed the importance of weighing himself daily and when to notify us of increasing volume.  Signed, Tereso Newcomer, PA-C  2:42 PM 11/18/2012

## 2012-11-18 NOTE — Patient Instructions (Addendum)
Your physician has recommended you make the following change in your medication:  START lasix 20 mg daily  Your physician recommends that you return for lab work on 11/24/12 (bmet)  Your physician recommends that you schedule a follow-up appointment in: 2 weeks with Lorin Picket on a day Dr Antoine Poche is in the office  Weigh yourself daily.  Call if your weight goes up by 3 or more pounds in one day or if you have increased swelling or increased shortness of breath

## 2012-11-18 NOTE — Telephone Encounter (Signed)
Pt has an appointment today with Tereso Newcomer, PA

## 2012-11-24 ENCOUNTER — Ambulatory Visit (INDEPENDENT_AMBULATORY_CARE_PROVIDER_SITE_OTHER): Payer: Medicare Other | Admitting: *Deleted

## 2012-11-24 ENCOUNTER — Other Ambulatory Visit: Payer: Self-pay | Admitting: Internal Medicine

## 2012-11-24 DIAGNOSIS — Z7901 Long term (current) use of anticoagulants: Secondary | ICD-10-CM

## 2012-11-24 DIAGNOSIS — Z8679 Personal history of other diseases of the circulatory system: Secondary | ICD-10-CM

## 2012-11-24 DIAGNOSIS — I5042 Chronic combined systolic (congestive) and diastolic (congestive) heart failure: Secondary | ICD-10-CM

## 2012-11-24 DIAGNOSIS — I2510 Atherosclerotic heart disease of native coronary artery without angina pectoris: Secondary | ICD-10-CM

## 2012-11-24 DIAGNOSIS — I10 Essential (primary) hypertension: Secondary | ICD-10-CM

## 2012-11-24 DIAGNOSIS — I4891 Unspecified atrial fibrillation: Secondary | ICD-10-CM

## 2012-11-24 DIAGNOSIS — I2589 Other forms of chronic ischemic heart disease: Secondary | ICD-10-CM

## 2012-11-24 LAB — BASIC METABOLIC PANEL
CO2: 28 mEq/L (ref 19–32)
Calcium: 8.6 mg/dL (ref 8.4–10.5)
Chloride: 106 mEq/L (ref 96–112)
Creatinine, Ser: 1.9 mg/dL — ABNORMAL HIGH (ref 0.4–1.5)
Glucose, Bld: 106 mg/dL — ABNORMAL HIGH (ref 70–99)
Sodium: 140 mEq/L (ref 135–145)

## 2012-11-26 NOTE — Discharge Summary (Signed)
Physician Discharge Summary  [name] Sr. ZOX:[id number] DOB: 05-Feb-1920 DOA: 11/12/2012  PCP: Sanda Linger, MD  Admit date: 11/12/2012 Discharge date: 11/15/2012    Recommendations for Outpatient Follow-up:  1. Follow up with PCP as recommended.   Discharge Diagnoses:  Principal Problem:   Orthostatic hypotension Active Problems:   Chronic systolic heart failure   RENAL INSUFFICIENCY, CHRONIC   Discharge Condition: fair  Diet recommendation: low sodium diet  Filed Weights   11/12/12 2355 11/13/12 0500 11/14/12 0422  Weight: 62.823 kg (138 lb 8 oz) 62.7 kg (138 lb 3.7 oz) 62.778 kg (138 lb 6.4 oz)    History of present illness:  [name] G Thew Sr. is a 79 y.o. male who presents from his PCPs office with CC of low BP and dizziness. Patient was at his doctors office to get his INR checked today when he became dizzy after standing up to get out of car. BP was noted to be 86/60 in PCPs office. EMS was called and patient was taken to ED.  In ED diagnosis of orthostatic hypotension was confirmed with BPs of 121/61 lying down, 110/57 seated, that dropped to 76/48 when standing. Patient was given 500cc bolus of fluid, which was insufficient to resolve this. Unfortunately it seems that the patients hypotension may be due to medication error at home (specifically he may have been taking toresemide when he was supposed to have stopped this med at home per his daughter). Additionally there are concerns regarding the need for in home healthcare and ability of him and his wife to take care of themselves per daughter (wife has been resistant to allowing anyone in the home). As a result he will be admitted for observation, fluids, and SW consultation.   Hospital Course:  1. Orthostatic hypotension Secondary to dehydration probably secondary to increased use Of torsemide. Torsemide and imdur are held for hypotension and he was given fluid boluses and on maintenance fluids. - SW consult regarding  home health has been placed. Needs to be discussion between patient, daughter, and mother regarding this. 2. Chronic renal insufficiency - creatinine at baseline, continue to monitor 3. H/o HTN - hold imdur and torsemide for hypotension.  4. Chronic systolic CHF - chronic and stable at this time, not in acute  heart failure. Resume metoprolol.    Consultations:  none  Discharge Exam: Filed Vitals:   11/14/12 1330 11/14/12 1858 11/14/12 1959 11/15/12 0624  BP: 124/49 120/50 113/49 115/64  Pulse: 68 62 69 98  Temp: 97.3 F (36.3 C)  97.6 F (36.4 C) 98 F (36.7 C)  TempSrc: Oral  Oral Oral  Resp: 18  17   Height:      Weight:      SpO2: 97%  96% 98%   General: NAD, comfortable  Cardiovascular: RRR w/o MRG  Respiratory: CTA B  Abdomen: soft, nt, nd, bs+  Skin: no rash nor lesion  Musculoskeletal: MAE, full ROM all 4 extremities  Psychiatric: normal tone and affect  Neurologic: AAOx3, grossly non-focal   Discharge Instructions  Discharge Orders   Future Appointments Provider Department Dept Phone   12/01/2012 3:00 PM [facility] [phone]   12/01/2012 3:20 PM Beatrice Lecher, PA-C [facility] Main [facility] [place]) [phone]   Future Orders Complete By Expires     (HEART FAILURE PATIENTS) Call MD:  Anytime you have any of the following symptoms: 1) 3 pound weight gain in 24 hours or 5 pounds in 1 week  2) shortness of breath, with or without a dry hacking cough 3) swelling in the hands, feet or stomach 4) if you have to sleep on extra pillows at night in order to breathe.  As directed     Activity as tolerated - No restrictions  As directed     Diet - low sodium heart healthy  As directed     Discharge instructions  As directed     Comments:      Follow up with Dr Antoine Poche in 1 week.        Medication List    STOP taking these medications       isosorbide mononitrate 30 MG 24 hr tablet  Commonly known as:  IMDUR       TAKE these medications       carvedilol 3.125 MG tablet  Commonly known as:  COREG  Take 3.125 mg by mouth 2 (two) times daily with a meal.     fenofibrate 54 MG tablet  Take 1 tablet (54 mg total) by mouth daily.     levothyroxine 100 MCG tablet  Commonly known as:  SYNTHROID, LEVOTHROID  Take 1 tablet (100 mcg total) by mouth 2 (two) times daily.     nitroGLYCERIN 0.4 MG SL tablet  Commonly known as:  NITROSTAT  Place 0.4 mg under the tongue every 5 (five) minutes as needed for chest pain.           Follow-up Information   Follow up with Sanda Linger, MD. Schedule an appointment as soon as possible for a visit in 1 week.   Contact information:   [address] Vic Ripper [place] [address] [phone]        The results of significant diagnostics from this hospitalization (including imaging, microbiology, ancillary and laboratory) are listed below for reference.    Significant Diagnostic Studies: Dg Chest 1 View  11/12/2012  *RADIOLOGY REPORT*  Clinical Data: 79 year old male with dizziness and confusion.  CHEST - 1 VIEW  Comparison: 10/13/2012 and prior chest radiographs dating back to 2006  Findings: Cardiomegaly and CABG noted. There is no evidence of focal airspace disease, pulmonary edema, suspicious pulmonary nodule/mass, pleural effusion, or pneumothorax. No acute bony abnormalities are identified.  IMPRESSION: Cardiomegaly without evidence of acute cardiopulmonary disease.   Original Report Authenticated By: Harmon Pier, M.D.     Microbiology: No results found for this or any previous visit (from the past 240 hour(s)).   Labs: Basic Metabolic Panel:  Recent Labs Lab 11/24/12 1545  NA 140  K 4.2  CL 106  CO2 28  GLUCOSE 106*  BUN 27*  CREATININE 1.9*  CALCIUM 8.6   Liver Function Tests: No results found for this basename: AST, ALT, ALKPHOS, BILITOT, PROT, ALBUMIN,  in the last 168 hours No results found for this  basename: LIPASE, AMYLASE,  in the last 168 hours No results found for this basename: AMMONIA,  in the last 168 hours CBC: No results found for this basename: WBC, NEUTROABS, HGB, HCT, MCV, PLT,  in the last 168 hours Cardiac Enzymes: No results found for this basename: CKTOTAL, CKMB, CKMBINDEX, TROPONINI,  in the last 168 hours BNP: BNP (last 3 results)  Recent Labs  04/23/12 1834  PROBNP 10197.0*   CBG: No results found for this basename: GLUCAP,  in the last 168 hours     Signed:  Vaudie Engebretsen  [facility] 11/15/2012, 4:33 PM

## 2012-12-01 ENCOUNTER — Ambulatory Visit (INDEPENDENT_AMBULATORY_CARE_PROVIDER_SITE_OTHER): Payer: Medicare Other | Admitting: Physician Assistant

## 2012-12-01 ENCOUNTER — Ambulatory Visit (INDEPENDENT_AMBULATORY_CARE_PROVIDER_SITE_OTHER): Payer: Medicare Other | Admitting: *Deleted

## 2012-12-01 ENCOUNTER — Encounter: Payer: Self-pay | Admitting: Physician Assistant

## 2012-12-01 VITALS — BP 118/64 | HR 83 | Ht 71.0 in | Wt 151.4 lb

## 2012-12-01 DIAGNOSIS — I10 Essential (primary) hypertension: Secondary | ICD-10-CM

## 2012-12-01 DIAGNOSIS — Z8679 Personal history of other diseases of the circulatory system: Secondary | ICD-10-CM

## 2012-12-01 DIAGNOSIS — N189 Chronic kidney disease, unspecified: Secondary | ICD-10-CM

## 2012-12-01 DIAGNOSIS — Z7901 Long term (current) use of anticoagulants: Secondary | ICD-10-CM

## 2012-12-01 DIAGNOSIS — I4891 Unspecified atrial fibrillation: Secondary | ICD-10-CM

## 2012-12-01 DIAGNOSIS — I5042 Chronic combined systolic (congestive) and diastolic (congestive) heart failure: Secondary | ICD-10-CM

## 2012-12-01 MED ORDER — FUROSEMIDE 20 MG PO TABS: 40.0000 mg | ORAL_TABLET | Freq: Every day | ORAL | Status: DC

## 2012-12-01 NOTE — Progress Notes (Signed)
[address] [place], [place]  [address] Phone: [phone], Fax:  [phone]  Date:  12/01/2012   ID:  [name] Mans Sr., DOB [birth year], MRN [id number]  PCP:  Sanda Linger, MD  Primary Cardiologist:  Dr. Rollene Rotunda     History of Present Illness: [name] G Arshad Sr. is a 79 y.o. male who returns for f/u on CHF.  He has a hx of CAD, s/p CABG in 2007, s/p stent to the OM in 07/2007, combined systolic and diastolic CHF, CKD and permanent AFib on chronic Coumadin therapy. He has been followed closely for volume overload over the past several weeks.  Recently his weight had dropped 20 lbs in 7 days.  He has a hx of non-compliance with his medications.  I was concerned that he had suddenly started to take his medications as prescribed.  I cut back on his Demadex.  He was admitted several weeks ago with orthostatic hypotension.  He was given IVFs.  Torsemide and Imdur were held.  I saw him 3/25 with increased LE edema.  I put him back on low dose Lasix 20 mg QD and had him f/u today.  He denies worsening dyspnea. He denies syncope. Denies chest pain. He denies orthopnea or PND.    Labs (9/13):    K 3.3, Cr 2.7 Labs (10/13):  K 4.2, Cr 2.3 Labs (1/14):    K 5.1, Cr 1.9 Labs (2/14):    K 5 => 4.2, Cr 2.4 => 2.4 Labs (3/14):    K 3.9=>4.2, Cr 2.4 => 1.73=>1.9, Hgb 11, Plt 123K   Wt Readings from Last 3 Encounters:  12/01/12 151 lb 6.4 oz (68.675 kg)  11/18/12 155 lb 6.4 oz (70.489 kg)  11/14/12 138 lb 6.4 oz (62.778 kg)     Past Medical History  Diagnosis Date  . Coronary artery disease     a.  s/p MI;  b. s/p CABG 2007;  c. cath 12/08: LM occluded, pRCA 80%, dRCA 50%, PLB 90%, S-PDA ok, S-Dx ok with occluded dist Dx; L-LAD ok, S-OM ok with 90% after graft insertion . . . . distal OM treated with DES  . Ischemic cardiomyopathy     echo 6/12 EF 35-40%, apical, dAnt, mid to dist inf-lat HK, mild AI, mod MR, mod BAE, PASP 44  . Chronic combined systolic and  diastolic heart failure     admx 8/13;   . Hypertension   . Atrial fibrillation     a. on coumadin.  . CKD (chronic kidney disease), stage IV   . CVA (cerebral infarction)     left frontal  . Anemia     chronic  . Hypothyroidism   . Leg muscle spasm   . GERD (gastroesophageal reflux disease)     Current Outpatient Prescriptions  Medication Sig Dispense Refill  . carvedilol (COREG) 3.125 MG tablet Take 3.125 mg by mouth 2 (two) times daily with a meal.      . fenofibrate 54 MG tablet Take 1 tablet (54 mg total) by mouth daily.  30 tablet  6  . furosemide (LASIX) 20 MG tablet Take 1 tablet (20 mg total) by mouth daily.  30 tablet  6  . L-Methylfolate-B6-B12 (FOLTANX) 3-35-2 MG TABS Take 1 tablet by mouth 2 (two) times daily.      Marland Kitchen levothyroxine (SYNTHROID, LEVOTHROID) 100 MCG tablet Take 1 tablet (100 mcg total) by mouth 2 (two) times daily.  60 tablet  11  . nitroGLYCERIN (  NITROSTAT) 0.4 MG SL tablet Place 0.4 mg under the tongue every 5 (five) minutes as needed for chest pain.      Marland Kitchen warfarin (COUMADIN) 2.5 MG tablet TAKE AS DIRECTED BY ANTICOAGULATION CLINIC  40 tablet  3   No current facility-administered medications for this visit.    Allergies:    Allergies  Allergen Reactions  . Ace Inhibitors     Cannot remember the reaction    Social History:  The patient  reports that he has never smoked. He has never used smokeless tobacco. He reports that he does not drink alcohol or use illicit drugs.   ROS:  Please see the history of present illness.  He called the ambulance to his house a week ago in the middle of the night due to concerns of having a stroke.  He had some numbness in both lower legs and feet.  He had PT the day before.  No facial droop, speech difficulty or unilateral weakness.   All other systems reviewed and negative.   PHYSICAL EXAM: VS:  BP 118/64  Pulse 83  Ht 5\' 11"  (1.803 m)  Wt 151 lb 6.4 oz (68.675 kg)  BMI 21.13 kg/m2  Well nourished, well  developed, in no acute distress HEENT: normal Neck: no JVD at 90 degrees Cardiac:  normal S1, S2; irregularly irregular; no murmur Lungs:  Clear to auscultation bilaterally, no wheezing, rhonchi or rales Abd: soft, nontender  Ext: 1+ bilateral ankle edema Skin: warm and dry; decreased turgor noted Neuro:  CNs 2-12 intact, no focal abnormalities noted  ECG: Atrial fibrillation, HR 83  ASSESSMENT AND PLAN:  1. Chronic Combined Systolic and Diastolic CHF:  His weight is down 4 pounds.  But his legs are more swollen.  I am concerned he is increasing his volume.  I will have him increase his Lasix back to 40 mg QD.  If he continues to gain, we may need to switch him back to [facility].  He and his daughter know to monitor his weights and to call if they are increasing.  He has compression stockings at home and I have encouraged him to use these to reduce his edema.  Check a basic metabolic panel today and in one week. Continue close follow up with me.  2. Numbness:  Possibly related to his neuropathy.  I reassured him that his symptoms are not consistent with a CVA.  In addition, he takes coumadin to prevent ischemic stroke.  We discussed s/s of a stroke.  3. Coronary Artery Disease:  No angina.  Continue current Rx. 4. Atrial Fibrillation:  Rate controlled.  He remains on coumadin. 5. Chronic Kidney Disease:  Check a basic metabolic panel today and in one week..   6. Hypothyroidism:  Recent TSH low and medications adjusted.  He has f/u with his PCP for further management.   7. Disposition:  Follow up with me in 3 weeks and Dr. Rollene Rotunda in 3 mos.  Signed, Tereso Newcomer, PA-C  3:59 PM 12/01/2012

## 2012-12-01 NOTE — Patient Instructions (Addendum)
Your physician has recommended you make the following change in your medication:  INCREASE LASIX TO (40MG ) DAILY, TAKE TWO OF YOUR (20MG ) TABLETS   Your physician recommends that you HAVE lab work TODAY:BMET   Your physician recommends that you return for lab work in: ONE WEEK ON [facility] April 14TH FOR A REPEAT BMET. JUST WALK IN FROM 7:30 AM TO 5:00PM AND TELL THE FRONT DESK YOU ARE HERE FOR LAB WORK   THE OFFICE scheduled a follow-up appointment WITH SCOTT WEAVER, PA-C ON Monday 4 /28/14 AT 3:20 PM  THE OFFICE scheduled a follow-up appointment WITH DR. [facility] ON Monday 02/16/13 AT 4:15 PM

## 2012-12-02 ENCOUNTER — Telehealth: Payer: Self-pay | Admitting: *Deleted

## 2012-12-02 LAB — BASIC METABOLIC PANEL
CO2: 28 mEq/L (ref 19–32)
Calcium: 8.7 mg/dL (ref 8.4–10.5)
Chloride: 106 mEq/L (ref 96–112)
Glucose, Bld: 83 mg/dL (ref 70–99)
Sodium: 140 mEq/L (ref 135–145)

## 2012-12-02 NOTE — Telephone Encounter (Signed)
Message copied by Tarri Fuller on Tue Dec 02, 2012  4:01 PM ------      Message from: [place], [place] T      Created: Tue Dec 02, 2012 12:44 PM       K+ ok      Renal fxn stable      Continue with current treatment plan.      Tereso Newcomer, PA-C  12:44 PM 12/02/2012 ------

## 2012-12-02 NOTE — Telephone Encounter (Signed)
lmom labs ok, no changes to be made today 

## 2012-12-08 ENCOUNTER — Other Ambulatory Visit (INDEPENDENT_AMBULATORY_CARE_PROVIDER_SITE_OTHER): Payer: Medicare Other

## 2012-12-08 ENCOUNTER — Ambulatory Visit (INDEPENDENT_AMBULATORY_CARE_PROVIDER_SITE_OTHER): Payer: Medicare Other | Admitting: Internal Medicine

## 2012-12-08 ENCOUNTER — Encounter: Payer: Self-pay | Admitting: Internal Medicine

## 2012-12-08 ENCOUNTER — Other Ambulatory Visit: Payer: Medicare Other

## 2012-12-08 ENCOUNTER — Telehealth: Payer: Self-pay | Admitting: Cardiology

## 2012-12-08 VITALS — BP 96/52 | HR 68 | Temp 97.6°F | Resp 16 | Wt 148.0 lb

## 2012-12-08 DIAGNOSIS — N184 Chronic kidney disease, stage 4 (severe): Secondary | ICD-10-CM

## 2012-12-08 DIAGNOSIS — I10 Essential (primary) hypertension: Secondary | ICD-10-CM

## 2012-12-08 DIAGNOSIS — E039 Hypothyroidism, unspecified: Secondary | ICD-10-CM

## 2012-12-08 DIAGNOSIS — D539 Nutritional anemia, unspecified: Secondary | ICD-10-CM

## 2012-12-08 DIAGNOSIS — I5042 Chronic combined systolic (congestive) and diastolic (congestive) heart failure: Secondary | ICD-10-CM

## 2012-12-08 DIAGNOSIS — I4891 Unspecified atrial fibrillation: Secondary | ICD-10-CM

## 2012-12-08 DIAGNOSIS — N189 Chronic kidney disease, unspecified: Secondary | ICD-10-CM

## 2012-12-08 DIAGNOSIS — G4730 Sleep apnea, unspecified: Secondary | ICD-10-CM

## 2012-12-08 LAB — CBC WITH DIFFERENTIAL/PLATELET
Basophils Absolute: 0 10*3/uL (ref 0.0–0.1)
Eosinophils Absolute: 0.2 10*3/uL (ref 0.0–0.7)
Eosinophils Relative: 3.7 % (ref 0.0–5.0)
HCT: 30.6 % — ABNORMAL LOW (ref 39.0–52.0)
Lymphs Abs: 1.7 10*3/uL (ref 0.7–4.0)
MCHC: 33.4 g/dL (ref 30.0–36.0)
MCV: 93.1 fl (ref 78.0–100.0)
Monocytes Absolute: 0.6 10*3/uL (ref 0.1–1.0)
Neutrophils Relative %: 57 % (ref 43.0–77.0)
Platelets: 139 10*3/uL — ABNORMAL LOW (ref 150.0–400.0)
RDW: 15.2 % — ABNORMAL HIGH (ref 11.5–14.6)
WBC: 5.8 10*3/uL (ref 4.5–10.5)

## 2012-12-08 NOTE — Telephone Encounter (Signed)
Advised pt to follow up with Dr. Felicity Coyer, PCP today.  Pt agreed.

## 2012-12-08 NOTE — Patient Instructions (Signed)

## 2012-12-08 NOTE — Telephone Encounter (Signed)
New problem     Pts daughter wants to know if he needs to be seen today-pt experiencing weight loss & is confused-pt has lab appt today at 10:40a

## 2012-12-08 NOTE — Progress Notes (Signed)
Subjective:    Patient ID: [name] Mans Sr., male    DOB: 10-01-1919, 79 y.o.   MRN: [id number]  Congestive Heart Failure Presents for follow-up visit. Associated symptoms include fatigue, shortness of breath and unexpected weight change (some weight loss). Pertinent negatives include no abdominal pain, chest pain, chest pressure, claudication, muscle weakness, near-syncope, nocturia, orthopnea, palpitations or paroxysmal nocturnal dyspnea. The symptoms have been stable. Compliance with total regimen is 76-100%.      Review of Systems  Constitutional: Positive for fatigue and unexpected weight change (some weight loss). Negative for fever, chills, diaphoresis, activity change and appetite change.  HENT: Negative.   Eyes: Negative.   Respiratory: Positive for shortness of breath. Negative for apnea, cough, choking, chest tightness, wheezing and stridor.   Cardiovascular: Negative.  Negative for chest pain, palpitations, claudication, leg swelling and near-syncope.  Gastrointestinal: Negative.  Negative for nausea, vomiting, abdominal pain, diarrhea, constipation and blood in stool.  Endocrine: Negative.   Genitourinary: Negative.  Negative for nocturia.  Musculoskeletal: Negative.  Negative for myalgias, back pain, joint swelling, arthralgias, gait problem and muscle weakness.  Skin: Negative.  Negative for color change, pallor, rash and wound.  Allergic/Immunologic: Negative.   Neurological: Negative.  Negative for dizziness, weakness and light-headedness.  Hematological: Negative.  Negative for adenopathy. Does not bruise/bleed easily.  Psychiatric/Behavioral: Positive for confusion and decreased concentration. Negative for suicidal ideas, hallucinations, behavioral problems, sleep disturbance, self-injury, dysphoric mood and agitation. The patient is not nervous/anxious and is not hyperactive.        Objective:   Physical Exam  Vitals reviewed. Constitutional: He is oriented to  person, place, and time. He appears well-developed and well-nourished. No distress.  HENT:  Head: Normocephalic and atraumatic.  Mouth/Throat: Oropharynx is clear and moist. No oropharyngeal exudate.  Eyes: Conjunctivae are normal. Right eye exhibits no discharge. Left eye exhibits no discharge. No scleral icterus.  Neck: Normal range of motion. Neck supple. No JVD present. No tracheal deviation present. No thyromegaly present.  Cardiovascular: Normal rate, regular rhythm, normal heart sounds and intact distal pulses.  Exam reveals no gallop and no friction rub.   No murmur heard. Pulmonary/Chest: Effort normal and breath sounds normal. No stridor. No respiratory distress. He has no wheezes. He has no rales. He exhibits no tenderness.  Abdominal: Soft. Bowel sounds are normal. He exhibits no distension and no mass. There is no tenderness. There is no rebound and no guarding.  Musculoskeletal: Normal range of motion. He exhibits no edema and no tenderness.  Lymphadenopathy:    He has no cervical adenopathy.  Neurological: He is oriented to person, place, and time.  Skin: Skin is warm and dry. No rash noted. He is not diaphoretic. No erythema. No pallor.  Psychiatric: He has a normal mood and affect. Judgment and thought content normal. His mood appears not anxious. His affect is not angry, not blunt, not labile and not inappropriate. His speech is delayed and tangential. His speech is not rapid and/or pressured and not slurred. He is slowed and withdrawn. He is not agitated, not aggressive, not hyperactive, not actively hallucinating and not combative. Thought content is not paranoid and not delusional. Cognition and memory are impaired. He does not express impulsivity or inappropriate judgment. He does not exhibit a depressed mood. He expresses no homicidal and no suicidal ideation. He expresses no suicidal plans and no homicidal plans. He is communicative. He exhibits abnormal recent memory and  abnormal remote memory. He is inattentive.  Lab Results  Component Value Date   WBC 6.4 11/14/2012   HGB 11.0* 11/14/2012   HCT 33.6* 11/14/2012   PLT 123* 11/14/2012   GLUCOSE 83 12/01/2012   CHOL 104 04/24/2012   TRIG 54 04/24/2012   HDL 31* 04/24/2012   LDLCALC 62 04/24/2012   ALT 19 10/13/2012   AST 28 10/13/2012   NA 140 12/01/2012   K 4.5 12/01/2012   CL 106 12/01/2012   CREATININE 1.9* 12/01/2012   BUN 44* 12/01/2012   CO2 28 12/01/2012   TSH 0.01* 11/24/2012   INR 2.2 12/01/2012   HGBA1C 6.5 06/28/2011       Assessment & Plan:

## 2012-12-09 LAB — BASIC METABOLIC PANEL
BUN: 46 mg/dL — ABNORMAL HIGH (ref 6–23)
Chloride: 105 mEq/L (ref 96–112)
Creatinine, Ser: 2.1 mg/dL — ABNORMAL HIGH (ref 0.4–1.5)
Glucose, Bld: 92 mg/dL (ref 70–99)

## 2012-12-09 LAB — TSH: TSH: 0.01 u[IU]/mL — ABNORMAL LOW (ref 0.35–5.50)

## 2012-12-10 ENCOUNTER — Encounter: Payer: Self-pay | Admitting: Internal Medicine

## 2012-12-10 NOTE — Assessment & Plan Note (Signed)
He has a normal fluid status today 

## 2012-12-10 NOTE — Assessment & Plan Note (Signed)
His BP is slightly low today but he does not feel orthostatic and he needs the diuretics so no changes made

## 2012-12-10 NOTE — Assessment & Plan Note (Signed)
He has good rate and rhythm control today. 

## 2012-12-10 NOTE — Assessment & Plan Note (Signed)
His TSH is suppressed so I have asked him to start his levothyroxine

## 2012-12-10 NOTE — Assessment & Plan Note (Signed)
His renal function has taken a slight decline due to the prerenal state, his daughter will stay in contact with his cardiologist about the diuretic dose

## 2012-12-15 ENCOUNTER — Telehealth: Payer: Self-pay

## 2012-12-15 ENCOUNTER — Ambulatory Visit (INDEPENDENT_AMBULATORY_CARE_PROVIDER_SITE_OTHER): Payer: Medicare Other | Admitting: Pharmacist

## 2012-12-15 ENCOUNTER — Encounter: Payer: Self-pay | Admitting: Pharmacist

## 2012-12-15 ENCOUNTER — Telehealth: Payer: Self-pay | Admitting: Cardiology

## 2012-12-15 DIAGNOSIS — Z7901 Long term (current) use of anticoagulants: Secondary | ICD-10-CM

## 2012-12-15 DIAGNOSIS — Z8679 Personal history of other diseases of the circulatory system: Secondary | ICD-10-CM

## 2012-12-15 DIAGNOSIS — I4891 Unspecified atrial fibrillation: Secondary | ICD-10-CM

## 2012-12-15 LAB — POCT INR: INR: 2.3

## 2012-12-15 NOTE — Telephone Encounter (Signed)
Reviewed labs and medications with daughter who understands to continue medications as instructed.  If pt develops increase in dizziness of decrease in urine output she should call.  Pt will keep appt as scheduled for Monday with Tereso Newcomer, PA.

## 2012-12-15 NOTE — Telephone Encounter (Signed)
New problem   Pt had labs at Dr Yetta Barre office and stated he was dehydrated. Pt is taking Lasix and Furosemide pt's daughter need to know if these meds need to be adjusted. Please call pt's daughter she is very confused.

## 2012-12-15 NOTE — Telephone Encounter (Signed)
Pt's daughter advised but is very upset that she was not contact sooner with pt's results. Daughter is requesting a call back from physician with further questions as well as Print production planner.

## 2012-12-15 NOTE — Telephone Encounter (Signed)
Pt's daughter called requesting a call back regarding pt's recent labs results. Left message on machine for daughter to return my call

## 2012-12-16 ENCOUNTER — Telehealth: Payer: Self-pay

## 2012-12-16 NOTE — Telephone Encounter (Signed)
Pt notified of labs

## 2012-12-16 NOTE — Telephone Encounter (Signed)
LM for pt to call back.

## 2012-12-16 NOTE — Telephone Encounter (Signed)
Message copied by Noreene Larsson on Tue Dec 16, 2012  2:27 PM ------      Message from: Etta Grandchild      Created: Tue Dec 09, 2012  4:46 PM       [place] - please let them know that the labs show slight dehydration and slight worsening of kidney function and anemia, also his thyroid level is too high. All together this could be making his memory worse. We should recheck these labs in about one month.            TJ ------

## 2012-12-16 NOTE — Telephone Encounter (Signed)
Message copied by Noreene Larsson on Tue Dec 16, 2012  1:32 PM ------      Message from: Etta Grandchild      Created: Tue Dec 09, 2012  4:46 PM       [place] - please let them know that the labs show slight dehydration and slight worsening of kidney function and anemia, also his thyroid level is too high. All together this could be making his memory worse. We should recheck these labs in about one month.            TJ ------

## 2012-12-22 ENCOUNTER — Ambulatory Visit (INDEPENDENT_AMBULATORY_CARE_PROVIDER_SITE_OTHER): Payer: Medicare Other | Admitting: Physician Assistant

## 2012-12-22 ENCOUNTER — Encounter: Payer: Self-pay | Admitting: Physician Assistant

## 2012-12-22 VITALS — BP 81/43 | HR 82 | Ht 71.0 in | Wt 146.0 lb

## 2012-12-22 DIAGNOSIS — I4891 Unspecified atrial fibrillation: Secondary | ICD-10-CM

## 2012-12-22 DIAGNOSIS — N189 Chronic kidney disease, unspecified: Secondary | ICD-10-CM

## 2012-12-22 DIAGNOSIS — I10 Essential (primary) hypertension: Secondary | ICD-10-CM

## 2012-12-22 DIAGNOSIS — I5042 Chronic combined systolic (congestive) and diastolic (congestive) heart failure: Secondary | ICD-10-CM

## 2012-12-22 MED ORDER — FUROSEMIDE 20 MG PO TABS: 20.0000 mg | ORAL_TABLET | Freq: Every day | ORAL | Status: DC

## 2012-12-22 NOTE — Progress Notes (Signed)
[address], [address] [place], [place]  [address] Phone: [phone], Fax:  [phone]  Date:  12/22/2012   ID:  [name] Mans Sr., DOB 10/29/1919, MRN [id number]  PCP:  Sanda Linger, MD  Primary Cardiologist:  Dr. Rollene Rotunda     History of Present Illness: [name] G Heck Sr. is a 79 y.o. male who returns for f/u on CHF.  He has a hx of CAD, s/p CABG in 2007, s/p stent to the OM in 07/2007, combined systolic and diastolic CHF, CKD and permanent AFib on chronic Coumadin therapy. I have followed him closely for volume overload over the past few months.  Recently his weight had dropped 20 lbs in 7 days.  He has a hx of non-compliance with his medications.  I was concerned that he had suddenly started to take his medications as prescribed.  I cut back on his diuretics.  He was then admitted several weeks ago with orthostatic hypotension.  He was given IVFs.  Torsemide and Imdur were held.  I then placed him back on a low dose of lasix due to increasing edema.  BP is low today.  He denies dizziness or near syncope.  He denies syncope.  He denies worsening dyspnea.  Denies chest pain. He denies orthopnea or PND.  Edema stable.    Labs (9/13):    K 3.3, Cr 2.7 Labs (10/13):  K 4.2, Cr 2.3 Labs (1/14):    K 5.1, Cr 1.9 Labs (2/14):    K 5 => 4.2, Cr 2.4 => 2.4 Labs (3/14):    K 3.9=>4.2, Cr 2.4 => 1.73=>1.9, Hgb 11, Plt 123K Labs (4/14):    K 4.4, Cr 1.9 => 2.1, Hgb 10.6, TSH 0.01  Wt Readings from Last 3 Encounters:  12/22/12 146 lb (66.225 kg)  12/08/12 148 lb (67.132 kg)  12/01/12 151 lb 6.4 oz (68.675 kg)     Past Medical History  Diagnosis Date  . Coronary artery disease     a.  s/p MI;  b. s/p CABG 2007;  c. cath 12/08: LM occluded, pRCA 80%, dRCA 50%, PLB 90%, S-PDA ok, S-Dx ok with occluded dist Dx; L-LAD ok, S-OM ok with 90% after graft insertion . . . . distal OM treated with DES  . Ischemic cardiomyopathy     echo 6/12 EF 35-40%, apical, dAnt, mid to  dist inf-lat HK, mild AI, mod MR, mod BAE, PASP 44  . Chronic combined systolic and diastolic heart failure     admx 8/13;   . Hypertension   . Atrial fibrillation     a. on coumadin.  . CKD (chronic kidney disease), stage IV   . CVA (cerebral infarction)     left frontal  . Anemia     chronic  . Hypothyroidism   . Leg muscle spasm   . GERD (gastroesophageal reflux disease)     Current Outpatient Prescriptions  Medication Sig Dispense Refill  . carvedilol (COREG) 3.125 MG tablet Take 3.125 mg by mouth 2 (two) times daily with a meal.      . fenofibrate 54 MG tablet Take 1 tablet (54 mg total) by mouth daily.  30 tablet  6  . furosemide (LASIX) 20 MG tablet Take 2 tablets (40 mg total) by mouth daily.  30 tablet  6  . L-Methylfolate-B6-B12 (FOLTANX) 3-35-2 MG TABS Take 1 tablet by mouth 2 (two) times daily.      . nitroGLYCERIN (NITROSTAT) 0.4 MG SL tablet Place 0.4  mg under the tongue every 5 (five) minutes as needed for chest pain.      Marland Kitchen warfarin (COUMADIN) 2.5 MG tablet TAKE AS DIRECTED BY ANTICOAGULATION CLINIC  40 tablet  3   No current facility-administered medications for this visit.    Allergies:    Allergies  Allergen Reactions  . Ace Inhibitors     Cannot remember the reaction    Social History:  The patient  reports that he has never smoked. He has never used smokeless tobacco. He reports that he does not drink alcohol or use illicit drugs.   ROS:  Please see the history of present illness.  He notes choking on food a couple weeks ago.  No dysphagia.  Has not had a recurrence.   All other systems reviewed and negative.   PHYSICAL EXAM: VS:  BP 81/43  Pulse 82  Ht 5\' 11"  (1.803 m)  Wt 146 lb (66.225 kg)  BMI 20.37 kg/m2  Well nourished, well developed, in no acute distress HEENT: normal Neck: no JVD   Cardiac:  normal S1, S2; irregularly irregular; no murmur Lungs:  Clear to auscultation bilaterally, no wheezing, rhonchi or rales Abd: soft, nontender    Ext: trace bilateral ankle edema Skin: warm and dry  Neuro:  CNs 2-12 intact, no focal abnormalities noted  ECG:  AFib HR 82  ASSESSMENT AND PLAN:  1. Chronic Combined Systolic and Diastolic CHF:  His weight is down further.  His BP is low as well.  He is not symptomatic with his low BP.  I will decrease his Lasix to 20 mg QD.  Check BMET today and again in 2 weeks. 2. Hypotension:  He is not symptomatic.  He looks good to me today.  Adjust lasix as noted and check labs.  3. Coronary Artery Disease:  No angina.  Continue current Rx. 4. Atrial Fibrillation:  Rate controlled.  He remains on coumadin. 5. Chronic Kidney Disease:  Check a basic metabolic panel today and 2 weeks.   6. Hypothyroidism:  Managed by PCP.    7. Disposition:  Follow up with me in 3-4 weeks and Dr. Rollene Rotunda in June as planned.  Signed, Tereso Newcomer, PA-C  3:35 PM 12/22/2012

## 2012-12-22 NOTE — Patient Instructions (Addendum)
LAB TODAY; BMET  DECREASE LASIX 20 MG DAILY  WATCH FLUID INTAKE; DRINK NO MORE THAN 60 OZ DAILY  PLEASE FOLLOW UP WITH SCOTT WEAVER,PAC IN 3-4 WEEKS  KEEP APPT WITH DR. HOCHREIN ON 02/16/13  PLEASE MAKE SURE DR. Yetta Barre RE-CHECKS BMET AT APPT

## 2012-12-23 ENCOUNTER — Telehealth: Payer: Self-pay | Admitting: *Deleted

## 2012-12-23 LAB — BASIC METABOLIC PANEL
BUN: 48 mg/dL — ABNORMAL HIGH (ref 6–23)
Calcium: 8.7 mg/dL (ref 8.4–10.5)
Creatinine, Ser: 2.4 mg/dL — ABNORMAL HIGH (ref 0.4–1.5)

## 2012-12-23 NOTE — Telephone Encounter (Signed)
Message copied by Tarri Fuller on Tue Dec 23, 2012  2:17 PM ------      Message from: [place], [place] T      Created: Tue Dec 23, 2012  1:53 PM       Creatinine up slightly      Lasix was decreased at office visit yesterday      Continue with current treatment plan.      He should have repeat BMET already scheduled for 2 weeks from now      [place], [place]  1:53 PM 12/23/2012 ------

## 2012-12-23 NOTE — Telephone Encounter (Signed)
lmom for pt's daughter who cares for the pt, labs ok, creatinine slightly elevated however Lorin Picket W. PA decreased lasix at yesterday's office visit, bmet in 2 weeks at Dr. Yetta Barre [facility] [address].

## 2012-12-26 ENCOUNTER — Other Ambulatory Visit: Payer: Self-pay | Admitting: Internal Medicine

## 2012-12-26 ENCOUNTER — Telehealth: Payer: Self-pay

## 2012-12-26 DIAGNOSIS — I10 Essential (primary) hypertension: Secondary | ICD-10-CM

## 2012-12-26 DIAGNOSIS — N184 Chronic kidney disease, stage 4 (severe): Secondary | ICD-10-CM

## 2012-12-26 NOTE — Telephone Encounter (Signed)
Pt's daughter in law called to request MD order repeat labs as advised 04/14

## 2012-12-26 NOTE — Telephone Encounter (Signed)
I ordered the labs.

## 2012-12-29 NOTE — Telephone Encounter (Signed)
Pt's daughter Ruthine Dose advised via personal VM

## 2013-01-05 ENCOUNTER — Telehealth: Payer: Self-pay

## 2013-01-05 ENCOUNTER — Other Ambulatory Visit (INDEPENDENT_AMBULATORY_CARE_PROVIDER_SITE_OTHER): Payer: Medicare Other

## 2013-01-05 DIAGNOSIS — N184 Chronic kidney disease, stage 4 (severe): Secondary | ICD-10-CM

## 2013-01-05 DIAGNOSIS — I10 Essential (primary) hypertension: Secondary | ICD-10-CM

## 2013-01-05 LAB — BASIC METABOLIC PANEL
Calcium: 8.8 mg/dL (ref 8.4–10.5)
GFR: 30.3 mL/min — ABNORMAL LOW (ref >60.00)
Glucose, Bld: 77 mg/dL (ref 70–99)
Potassium: 4.3 mEq/L (ref 3.5–5.1)
Sodium: 139 mEq/L (ref 135–145)

## 2013-01-05 NOTE — Telephone Encounter (Signed)
Error see result note

## 2013-01-09 ENCOUNTER — Other Ambulatory Visit: Payer: Self-pay

## 2013-01-09 ENCOUNTER — Emergency Department (HOSPITAL_COMMUNITY): Payer: Medicare Other

## 2013-01-09 ENCOUNTER — Encounter (HOSPITAL_COMMUNITY): Payer: Self-pay | Admitting: *Deleted

## 2013-01-09 ENCOUNTER — Emergency Department (HOSPITAL_COMMUNITY)
Admission: EM | Admit: 2013-01-09 | Discharge: 2013-01-09 | Disposition: A | Discharge status: Home / Self Care | Payer: Medicare Other | Attending: Emergency Medicine | Admitting: Emergency Medicine

## 2013-01-09 ENCOUNTER — Telehealth: Payer: Self-pay | Admitting: Cardiology

## 2013-01-09 DIAGNOSIS — Z951 Presence of aortocoronary bypass graft: Secondary | ICD-10-CM | POA: Insufficient documentation

## 2013-01-09 DIAGNOSIS — I129 Hypertensive chronic kidney disease with stage 1 through stage 4 chronic kidney disease, or unspecified chronic kidney disease: Secondary | ICD-10-CM | POA: Insufficient documentation

## 2013-01-09 DIAGNOSIS — I2510 Atherosclerotic heart disease of native coronary artery without angina pectoris: Secondary | ICD-10-CM | POA: Insufficient documentation

## 2013-01-09 DIAGNOSIS — Z8673 Personal history of transient ischemic attack (TIA), and cerebral infarction without residual deficits: Secondary | ICD-10-CM | POA: Insufficient documentation

## 2013-01-09 DIAGNOSIS — N184 Chronic kidney disease, stage 4 (severe): Secondary | ICD-10-CM | POA: Insufficient documentation

## 2013-01-09 DIAGNOSIS — Z7901 Long term (current) use of anticoagulants: Secondary | ICD-10-CM | POA: Insufficient documentation

## 2013-01-09 DIAGNOSIS — Z862 Personal history of diseases of the blood and blood-forming organs and certain disorders involving the immune mechanism: Secondary | ICD-10-CM | POA: Insufficient documentation

## 2013-01-09 DIAGNOSIS — Z8679 Personal history of other diseases of the circulatory system: Secondary | ICD-10-CM | POA: Insufficient documentation

## 2013-01-09 DIAGNOSIS — Z8719 Personal history of other diseases of the digestive system: Secondary | ICD-10-CM | POA: Insufficient documentation

## 2013-01-09 DIAGNOSIS — I4891 Unspecified atrial fibrillation: Secondary | ICD-10-CM | POA: Insufficient documentation

## 2013-01-09 DIAGNOSIS — Z9861 Coronary angioplasty status: Secondary | ICD-10-CM | POA: Insufficient documentation

## 2013-01-09 DIAGNOSIS — R202 Paresthesia of skin: Secondary | ICD-10-CM

## 2013-01-09 DIAGNOSIS — E860 Dehydration: Secondary | ICD-10-CM | POA: Insufficient documentation

## 2013-01-09 DIAGNOSIS — I5042 Chronic combined systolic (congestive) and diastolic (congestive) heart failure: Secondary | ICD-10-CM | POA: Insufficient documentation

## 2013-01-09 DIAGNOSIS — R209 Unspecified disturbances of skin sensation: Secondary | ICD-10-CM | POA: Insufficient documentation

## 2013-01-09 DIAGNOSIS — Z8639 Personal history of other endocrine, nutritional and metabolic disease: Secondary | ICD-10-CM | POA: Insufficient documentation

## 2013-01-09 DIAGNOSIS — Z79899 Other long term (current) drug therapy: Secondary | ICD-10-CM | POA: Insufficient documentation

## 2013-01-09 LAB — CBC WITH DIFFERENTIAL/PLATELET
Basophils Absolute: 0 10*3/uL (ref 0.0–0.1)
Basophils Relative: 0 % (ref 0–1)
HCT: 31.6 % — ABNORMAL LOW (ref 39.0–52.0)
Hemoglobin: 10.6 g/dL — ABNORMAL LOW (ref 13.0–17.0)
Lymphocytes Relative: 5 % — ABNORMAL LOW (ref 12–46)
MCHC: 33.5 g/dL (ref 30.0–36.0)
Monocytes Absolute: 1.3 10*3/uL — ABNORMAL HIGH (ref 0.1–1.0)
Monocytes Relative: 8 % (ref 3–12)
Neutro Abs: 14.5 10*3/uL — ABNORMAL HIGH (ref 1.7–7.7)
Neutrophils Relative %: 87 % — ABNORMAL HIGH (ref 43–77)
RDW: 14.8 % (ref 11.5–15.5)
WBC: 16.7 10*3/uL — ABNORMAL HIGH (ref 4.0–10.5)

## 2013-01-09 LAB — BASIC METABOLIC PANEL
CO2: 25 mEq/L (ref 19–32)
Chloride: 102 mEq/L (ref 96–112)
Creatinine, Ser: 2.15 mg/dL — ABNORMAL HIGH (ref 0.50–1.35)
GFR calc Af Amer: 29 mL/min — ABNORMAL LOW (ref >90)
Potassium: 4.4 mEq/L (ref 3.5–5.1)

## 2013-01-09 LAB — PROTIME-INR
INR: 1.94 — ABNORMAL HIGH (ref 0.00–1.49)
Prothrombin Time: 21.4 seconds — ABNORMAL HIGH (ref 11.6–15.2)

## 2013-01-09 LAB — GLUCOSE, CAPILLARY: Glucose-Capillary: 138 mg/dL — ABNORMAL HIGH (ref 70–99)

## 2013-01-09 LAB — URINALYSIS, ROUTINE W REFLEX MICROSCOPIC
Glucose, UA: NEGATIVE mg/dL
Leukocytes, UA: NEGATIVE
Nitrite: NEGATIVE
Specific Gravity, Urine: 1.014 (ref 1.005–1.030)
pH: 6.5 (ref 5.0–8.0)

## 2013-01-09 LAB — CG4 I-STAT (LACTIC ACID): Lactic Acid, Venous: 1.56 mmol/L (ref 0.5–2.2)

## 2013-01-09 MED ORDER — SODIUM CHLORIDE 0.9 % IV BOLUS (SEPSIS)
1000.0000 mL | Freq: Once | INTRAVENOUS | Status: AC
  Administered 2013-01-09: 1000 mL via INTRAVENOUS

## 2013-01-09 NOTE — ED Notes (Signed)
Family at bedside. 

## 2013-01-09 NOTE — ED Provider Notes (Signed)
History     CSN: [id number]  Arrival date & time 01/09/13  0545   First MD Initiated Contact with Patient 01/09/13 0602      Chief Complaint  Patient presents with  . Leg Pain    bilateral    (Consider location/radiation/quality/duration/timing/severity/associated sxs/prior treatment) HPI  79 year old male with history of atrial fibrillation currently on Coumadin, CAD, and chronic kidney disease presents complaining of leg pain.  Patient has a history of peripheral neuropathy. His Dr. previously prescribed Foltanx (L-Methylfolate-B6-B12) in which he has been taken for several months but hasn't been taking it for the past week. Last night while trying to get out from his bed to his wheelchair he experiencing tingling and numbness sensation to both of his feet from the ankle down to his toes. He was having difficulty walking because of it. EMS were called to bring patient to the ED for further evaluation. While waiting for EMS he also did experience some nausea without vomiting. Nausea has subsided after he received 4 mg of Zofran. Otherwise patient denies fever, chest pain, shortness of breath, abdominal pain, leg pain, or weakness. He has his toenails trimmed 3 days ago and family member noticed some blood at the tip of the L great toe.  Pt denies pain.  Pt has had prior episodes of numbness to feet before, transient. Has been eating/drinking as usual.  Lives at home with wife.  Last PT/INR was 2.35 1 week ago.   Past Medical History  Diagnosis Date  . Coronary artery disease     a.  s/p MI;  b. s/p CABG 2007;  c. cath 12/08: LM occluded, pRCA 80%, dRCA 50%, PLB 90%, S-PDA ok, S-Dx ok with occluded dist Dx; L-LAD ok, S-OM ok with 90% after graft insertion . . . . distal OM treated with DES  . Ischemic cardiomyopathy     echo 6/12 EF 35-40%, apical, dAnt, mid to dist inf-lat HK, mild AI, mod MR, mod BAE, PASP 44  . Chronic combined systolic and diastolic heart failure     admx 8/13;   .  Hypertension   . Atrial fibrillation     a. on coumadin.  . CKD (chronic kidney disease), stage IV   . CVA (cerebral infarction)     left frontal  . Anemia     chronic  . Hypothyroidism   . Leg muscle spasm   . GERD (gastroesophageal reflux disease)     Past Surgical History  Procedure Laterality Date  . Coronary artery bypass graft  2007  . Coronary angioplasty with stent placement      Family History  Problem Relation Age of Onset  . Cancer Mother   . Stroke Father     History  Substance Use Topics  . Smoking status: Never Smoker   . Smokeless tobacco: Never Used  . Alcohol Use: No      Review of Systems  Constitutional:       A complete 10 system review of systems was obtained and all systems are negative except as noted in the HPI and PMH.    Allergies  Ace inhibitors  Home Medications   Current Outpatient Rx  Name  Route  Sig  Dispense  Refill  . carvedilol (COREG) 3.125 MG tablet   Oral   Take 3.125 mg by mouth 2 (two) times daily with a meal.         . fenofibrate 54 MG tablet   Oral   Take 1 tablet (  54 mg total) by mouth daily.   30 tablet   6   . furosemide (LASIX) 20 MG tablet   Oral   Take 1 tablet (20 mg total) by mouth daily.         Marland Kitchen L-Methylfolate-B6-B12 (FOLTANX) 3-35-2 MG TABS   Oral   Take 1 tablet by mouth 2 (two) times daily.         . nitroGLYCERIN (NITROSTAT) 0.4 MG SL tablet   Sublingual   Place 0.4 mg under the tongue every 5 (five) minutes as needed for chest pain.         Marland Kitchen warfarin (COUMADIN) 2.5 MG tablet      TAKE AS DIRECTED BY ANTICOAGULATION CLINIC   40 tablet   3     BP 112/49  Temp(Src) 100.3 F (37.9 C) (Oral)  Resp 15  SpO2 93%  Physical Exam  Nursing note and vitals reviewed. Constitutional:  Cachectic appearing elderly male, in no acute distress  HENT:  Head: Atraumatic.  Mouth/Throat: Oropharynx is clear and moist.  Eyes: Conjunctivae are normal.  Neck: Neck supple.   Cardiovascular:  Irregularly irregular  Pulmonary/Chest: Effort normal and breath sounds normal.  Abdominal: Soft. There is no tenderness.  Musculoskeletal: He exhibits no edema and no tenderness.  BLE: no palpable cords, edema, erythema, negative Homan's sign.  L great toe with dried blood at tip of toe, no infection noted.      Neurological: He is alert.  5/5 strength to all 4 extremities.  No foot drop.  Patella DTR intact bilat, normal sensation to all toes  Skin: Skin is warm. No rash noted.  Psychiatric: He has a normal mood and affect.    ED Course  Procedures (including critical care time)   Date: 01/09/2013  Rate: 84  Rhythm: atrial fibrillation  QRS Axis: normal  Intervals: normal  ST/T Wave abnormalities: normal  Conduction Disutrbances:right bundle branch block and left posterior fascicular block  Narrative Interpretation:   Old EKG Reviewed: unchanged    6:29 AM Pt reports tingling sensation to both feet since last night.  He is NVI, normal strength, normal sensation on exam.  Will obtain labs, UA, check INR.  Care discussed with attending.  Will have pt ambulate.   10:45 AM Despite elevated WBC, pt appears well.  Doubt infection.  Pt tolerates PO without difficulty, abdomen soft and nontender.  Will check POCT lactate.   1:21 PM Lactate unremarkable.  Pt also felt better, able to ambulate with assists, can tolerates PO.  Pt will have close f/u with PCP for further management.  Return precaution discussed. Pt stable for discharge.    Labs Reviewed  CBC WITH DIFFERENTIAL - Abnormal; Notable for the following:    WBC 16.7 (*)    RBC 3.40 (*)    Hemoglobin 10.6 (*)    HCT 31.6 (*)    Platelets 97 (*)    Neutrophils Relative % 87 (*)    Neutro Abs 14.5 (*)    Lymphocytes Relative 5 (*)    Monocytes Absolute 1.3 (*)    All other components within normal limits  BASIC METABOLIC PANEL - Abnormal; Notable for the following:    Glucose, Bld 151 (*)    BUN  45 (*)    Creatinine, Ser 2.15 (*)    GFR calc non Af Amer 25 (*)    GFR calc Af Amer 29 (*)    All other components within normal limits  PROTIME-INR - Abnormal; Notable for  the following:    Prothrombin Time 21.4 (*)    INR 1.94 (*)    All other components within normal limits  GLUCOSE, CAPILLARY - Abnormal; Notable for the following:    Glucose-Capillary 138 (*)    All other components within normal limits  URINALYSIS, ROUTINE W REFLEX MICROSCOPIC  CG4 I-STAT (LACTIC ACID)   Dg Chest 2 View  01/09/2013   *RADIOLOGY REPORT*  Clinical Data: Fever and weakness  CHEST - 2 VIEW  Comparison: 11/12/2012  Findings: COPD with hyperinflation of the lungs.  Prior CABG.  The heart size is mildly enlarged.  Negative for heart failure.  Small pleural effusion is noted on the lateral view.  Negative for heart failure edema or pneumonia.  IMPRESSION: Small pleural effusion.  Negative for heart failure or pneumonia.   Original Report Authenticated By: Janeece Riggers, M.D.     1. Paresthesia of foot   2. Dehydration       MDM  BP 113/43  Pulse 70  Temp(Src) 100.5 F (38.1 C) (Rectal)  Resp 16  SpO2 100%  I have reviewed nursing notes and vital signs. I personally reviewed the imaging tests through PACS system  I reviewed available ER/hospitalization records thought the EMR         Fayrene Helper, [place] 01/09/13 1323

## 2013-01-09 NOTE — ED Notes (Signed)
PA-C at bedside 

## 2013-01-09 NOTE — ED Notes (Addendum)
Pt walked to bathroom with assistance. Tolerated without difficulty. Pt assisted back to bed.

## 2013-01-09 NOTE — ED Provider Notes (Signed)
Medical screening examination/treatment/procedure(s) were conducted as a shared visit with non-physician practitioner(s) and myself.  I personally evaluated the patient during the encounter.  Patient has history of peripheral neuropathy with bilateral foot and ankle numbness and tingling with capillary refill less than 2 seconds all toes bilaterally good movement to his feet and his left great toe is no tenderness no induration no increased warmth no purulent drainage no fluctuance and no significant erythema compared to his right great toe to suggest infection at this time.  Hurman Horn, MD 01/19/13 [phone]

## 2013-01-09 NOTE — ED Notes (Signed)
Pt resting quietly at the time. Eating snack. Family at bedside. No signs of distress noted.

## 2013-01-09 NOTE — ED Notes (Signed)
Per EMS: pt coming from home. Pt woke up around 3 am c/o pain in both legs. Pt has hx of neuropathy. Pt also nausea. Pt given 4mg  zofran. Pt denies chest pain, shortness of breath. Pt has a wound on his left great toe. Pt is A&O, respirations equal and unlabored, skin warm and dry.

## 2013-01-09 NOTE — Telephone Encounter (Signed)
New Problem:    Called in because her father was discharged form the ER today and was instructed to see his cardiologist within the next three days.  Wants to be seen on Monday 01/12/13. Please call back.

## 2013-01-09 NOTE — Telephone Encounter (Signed)
Spoke with pt's daughter who reports pt was seen in ED last night with leg pain, neuropathy and dehydration. He was not having any cardiac issues. Per discharge note pt to have close follow up with PCP. Daughter states pt has appt at coumadin clinic on Monday and is asking if Dr. Antoine Poche or Tereso Newcomer, PA could see pt at that time.  I told daughter there were no open appointments on Monday. She will contact pt's primary MD to schedule appt for follow up. She states she will call our office to schedule appointment if PCP is unable to see pt. I told her the only open appointments were with Norma Fredrickson, NP on Tuesday.  Daughter also wanted to schedule follow up appt with Tereso Newcomer, PA per follow up instructions at last office visit on April 28.  Appt made for February 03, 2013 at 2:20.

## 2013-01-09 NOTE — ED Notes (Signed)
Pt had no complaints of dizziness or lightheadedness while doing orthostatics

## 2013-01-12 ENCOUNTER — Other Ambulatory Visit (INDEPENDENT_AMBULATORY_CARE_PROVIDER_SITE_OTHER): Payer: Medicare Other

## 2013-01-12 ENCOUNTER — Ambulatory Visit (INDEPENDENT_AMBULATORY_CARE_PROVIDER_SITE_OTHER): Payer: Medicare Other

## 2013-01-12 ENCOUNTER — Ambulatory Visit (INDEPENDENT_AMBULATORY_CARE_PROVIDER_SITE_OTHER): Payer: Medicare Other | Admitting: Internal Medicine

## 2013-01-12 ENCOUNTER — Encounter: Payer: Self-pay | Admitting: Internal Medicine

## 2013-01-12 VITALS — BP 104/70 | HR 62 | Temp 97.5°F | Resp 16 | Wt 155.0 lb

## 2013-01-12 DIAGNOSIS — I4891 Unspecified atrial fibrillation: Secondary | ICD-10-CM

## 2013-01-12 DIAGNOSIS — E1165 Type 2 diabetes mellitus with hyperglycemia: Secondary | ICD-10-CM | POA: Insufficient documentation

## 2013-01-12 DIAGNOSIS — Z8679 Personal history of other diseases of the circulatory system: Secondary | ICD-10-CM

## 2013-01-12 DIAGNOSIS — Z7901 Long term (current) use of anticoagulants: Secondary | ICD-10-CM

## 2013-01-12 DIAGNOSIS — E039 Hypothyroidism, unspecified: Secondary | ICD-10-CM

## 2013-01-12 DIAGNOSIS — E1129 Type 2 diabetes mellitus with other diabetic kidney complication: Secondary | ICD-10-CM

## 2013-01-12 DIAGNOSIS — N184 Chronic kidney disease, stage 4 (severe): Secondary | ICD-10-CM

## 2013-01-12 LAB — POCT INR: INR: 1.6

## 2013-01-12 LAB — BASIC METABOLIC PANEL
BUN: 42 mg/dL — ABNORMAL HIGH (ref 6–23)
Chloride: 104 mEq/L (ref 96–112)
GFR: 30.78 mL/min — ABNORMAL LOW (ref >60.00)
Glucose, Bld: 87 mg/dL (ref 70–99)
Potassium: 4.4 mEq/L (ref 3.5–5.1)
Sodium: 138 mEq/L (ref 135–145)

## 2013-01-12 LAB — T4: T4, Total: 5.5 ug/dL (ref 5.0–12.5)

## 2013-01-12 MED ORDER — TIROSINT 25 MCG PO CAPS: 1.0000 | ORAL_CAPSULE | Freq: Every day | ORAL | Status: DC

## 2013-01-12 NOTE — ED Provider Notes (Signed)
Medical screening examination/treatment/procedure(s) were performed by non-physician practitioner and as supervising physician I was immediately available for consultation/collaboration.   Loren Racer, MD 01/12/13 Ebony Cargo

## 2013-01-12 NOTE — Patient Instructions (Signed)
Heart Failure  Heart failure (HF) is a condition in which the heart has trouble pumping blood. This means your heart does not pump blood efficiently for your body to work well. In some cases of HF, fluid may back up into your lungs or you may have swelling (edema) in your lower legs. HF is a long-term (chronic) condition. It is important for you to take good care of yourself and follow your caregiver's treatment plan.  CAUSES   · Health conditions:  · High blood pressure (hypertension) causes the heart muscle to work harder than normal. When pressure in the blood vessels is high, the heart needs to pump (contract) with more force in order to circulate blood throughout the body. High blood pressure eventually causes the heart to become stiff and weak.  · Coronary artery disease (CAD) is the buildup of cholesterol and fat (plaques) in the arteries of the heart. The blockage in the arteries deprives the heart muscle of oxygen and blood. This can cause chest pain and may lead to a heart attack. High blood pressure can also contribute to CAD.  · Heart attack (myocardial infarction) occurs when 1 or more arteries in the heart become blocked. The loss of oxygen damages the muscle tissue of the heart. When this happens, part of the heart muscle dies. The injured tissue does not contract as well and weakens the heart's ability to pump blood.  · Abnormal heart valves can cause HF when the heart valves do not open and close properly. This makes the heart muscle pump harder to keep the blood flowing.  · Heart muscle disease (cardiomyopathy or myocarditis) is damage to the heart muscle from a variety of causes. These can include drug or alcohol abuse, infections, or unknown reasons. These can increase the risk of HF.  · Lung disease makes the heart work harder because the lungs do not work properly. This can cause a strain on the heart leading it to fail.  · Diabetes increases the risk of HF. High blood sugar contributes to high  fat (lipid) levels in the blood. Diabetes can also cause slow damage to tiny blood vessels that carry important nutrients to the heart muscle. When the heart does not get enough oxygen and food, it can cause the heart to become weak and stiff. This leads to a heart that does not contract efficiently.  · Other diseases can contribute to HF. These include abnormal heart rhythms, thyroid problems, and low blood counts (anemia).  · Unhealthy lifestyle habits:  · Obesity.  · Smoking.  · Eating foods high in fat and cholesterol.  · Eating or drinking beverages high in salt.  · Drug or alcohol abuse.  · Lack of exercise.  SYMPTOMS   HF symptoms may vary and can be hard to detect. Symptoms may include:  · Shortness of breath with activity, such as climbing stairs.  · Persistent cough.  · Swelling of the feet, ankles, legs, or abdomen.  · Unexplained weight gain.  · Difficulty breathing when lying flat.  · Waking from sleep because of the need to sit up and get more air.  · Rapid heartbeat.  · Fatigue and loss of energy.  · Feeling lightheaded or close to fainting.  DIAGNOSIS   A diagnosis of HF is based on your history, symptoms, physical examination, and diagnostic tests.  Diagnostic tests for HF may include:  · EKG.  · Chest X-ray.  · Blood tests.  · Exercise stress test.  · Blood   oxygen test (arterial blood gas).  · Evaluation by a heart doctor (cardiologist).  · Ultrasound evaluation of the heart (echocardiogram).  · Heart artery test to look for blockages (angiogram).  · Radioactive imaging to look at the heart (radionuclide test).  TREATMENT   Treatment is aimed at managing the symptoms of HF. Medicines, lifestyle changes, or surgical intervention may be necessary to treat HF.  · Medicines to help treat HF may include:  · Angiotensin-converting enzyme (ACE) inhibitors. These block the effects of a blood protein called angiotensin-converting enzyme. ACE inhibitors relax (dilate) the blood vessels and help lower blood  pressure. This decreases the workload of the heart, slows the progression of HF, and improves symptoms.  · Angiotensin receptor blockers (ARBs). These medications work similar to ACE inhibitors. ARBs may be an alternative for people who cannot tolerate an ACE inhibitor.  · Aldosterone antagonists. This medication helps get rid of extra fluid from your body. This lowers the volume of blood the heart has to pump.  · Water pills (diuretics). Diuretics cause the kidneys to remove salt and water from the blood. The extra fluid is removed by urination. By removing extra fluid from the body, diuretics help lower the workload of the heart and help prevent fluid buildup in the lungs so breathing is easier.  · Beta blockers. These prevent the heart from beating too fast and improve heart muscle strength. Beta blockers help maintain a normal heart rate, control blood pressure, and improve HF symptoms.  · Digitalis. This increases the force of the heartbeat and may be helpful to people with HF or heart rhythm problems.  · Healthy lifestyle changes include:  · Stopping smoking.  · Eating a healthy diet. Avoid foods high in fat. Avoid foods fried in oil or made with fat. A dietician can help with healthy food choices.  · Limiting how much salt you eat.  · Limiting alcohol intake to no more than 1 drink per day for women and 2 drinks per day for men. Drinking more than that is harmful to your heart. If your heart has already been damaged by alcohol or you have severe HF, drinking alcohol should be stopped completely.  · Exercising as directed by your caregiver.  · Surgical treatment for HF may include:  · Procedures to open blocked arteries, repair damaged heart valves, or remove damaged heart muscle tissue.  · A pacemaker to help heart muscle function and to control certain abnormal heart rhythms.  · A defibrillator to possibly prevent sudden cardiac death.  HOME CARE INSTRUCTIONS   · Activity level. Your caregiver can help you  determine what type of exercise program may be helpful. It is important to maintain your strength. Pace your physical activity to avoid shortness of breath or chest pain. Rest for 1 hour before and after meals. A cardiac rehabilitation program may be helpful to some people with HF.  · Diet. Eat a heart healthy diet. Food choices should be low in saturated fat and cholesterol. Talk to a dietician to learn about heart healthy foods.  · Salt intake. When you have HF, you need to limit the amount of salt you eat. Eat less than 1500 milligrams (mg) of salt per day or as recommended by your caregiver.  · Weight monitoring. Weigh yourself every day. You should weigh yourself in the morning after you urinate and before you eat breakfast. Wear the same amount of clothing each time you weigh yourself. Record your weight daily. Bring your recorded   weights to your clinic visits. Tell your caregiver right away if you have gained 3 lb/1.4 kg in 1 day, or 5 lb/2.3 kg in a week or whatever amount you were told to report.  · Blood pressure monitoring. This should be done as directed by your caregiver. A home blood pressure cuff can be purchased at a drugstore. Record your blood pressure numbers and bring them to your clinic visits. Tell your caregiver if you become dizzy or lightheaded upon standing up.  · Smoking. If you are currently a smoker, it is time to quit. Nicotine makes your heart work harder by causing your blood vessels to constrict. Do not use nicotine gum or patches before talking to your caregiver.  · Follow up. Be sure to schedule a follow-up visit with your caregiver. Keep all your appointments.  SEEK MEDICAL CARE IF:   · Your weight increases by 3 lb/1.4 kg in 1 day or 5 lb/2.3 kg in a week.  · You notice increasing shortness of breath that is unusual for you. This may happen during rest, sleep, or with activity.  · You cough more than normal, especially with physical activity.  · You notice more swelling in your  hands, feet, ankles, or belly (abdomen).  · You are unable to sleep because it is hard to breathe.  · You cough up bloody mucus (sputum).  · You begin to feel "jumping" or "fluttering" sensations (palpitations) in your chest.  SEEK IMMEDIATE MEDICAL CARE IF:   · You have severe chest pain or pressure which may include symptoms such as:  · Pain or pressure in the arms, neck, jaw, or back.  · Feeling sweaty.  · Feeling sick to your stomach (nauseous).  · Feeling short of breath while at rest.  · Having a fast or irregular heartbeat.  · You experience stroke symptoms. These symptoms include:  · Facial weakness or numbness.  · Weakness or numbness in an arm, leg, or on one side of your body.  · Blurred vision.  · Difficulty talking or thinking.  · Dizziness or fainting.  · Severe headache.  MAKE SURE YOU:   · Understand these instructions.  · Will watch your condition.  · Will get help right away if you are not doing well or get worse.  Document Released: 08/13/2005 Document Revised: 02/12/2012 Document Reviewed: 11/25/2009  [employer]® Patient Information ©2013 [employer].

## 2013-01-12 NOTE — Progress Notes (Signed)
Subjective:    Patient ID: [name] Sr., male    DOB: 01-26-1920, 79 y.o.   MRN: [id number]  Thyroid Problem Presents for follow-up visit. Symptoms include fatigue and leg swelling. Patient reports no anxiety, cold intolerance, constipation, depressed mood, diaphoresis, diarrhea, dry skin, hair loss, heat intolerance, hoarse voice, palpitations, tremors, visual change, weight gain or weight loss. The symptoms have been stable.      Review of Systems  Constitutional: Positive for fatigue. Negative for fever, chills, weight loss, weight gain, diaphoresis, activity change, appetite change and unexpected weight change.  HENT: Negative.  Negative for hoarse voice.   Eyes: Negative.   Respiratory: Negative.  Negative for cough, chest tightness, shortness of breath, wheezing and stridor.   Cardiovascular: Negative.  Negative for palpitations.  Gastrointestinal: Negative.  Negative for nausea, vomiting, abdominal pain, diarrhea and constipation.  Endocrine: Negative.  Negative for cold intolerance and heat intolerance.  Genitourinary: Negative.   Musculoskeletal: Negative.   Skin: Negative.   Allergic/Immunologic: Negative.   Neurological: Negative.  Negative for dizziness and tremors.  Hematological: Negative.   Psychiatric/Behavioral: Positive for confusion and decreased concentration. Negative for suicidal ideas, hallucinations, behavioral problems, sleep disturbance, self-injury, dysphoric mood and agitation. The patient is not nervous/anxious and is not hyperactive.        Objective:   Physical Exam  Vitals reviewed. Constitutional: He is oriented to person, place, and time. He appears well-developed and well-nourished. No distress.  HENT:  Head: Normocephalic and atraumatic.  Mouth/Throat: Oropharynx is clear and moist. No oropharyngeal exudate.  Eyes: Conjunctivae are normal. Right eye exhibits no discharge. Left eye exhibits no discharge. No scleral icterus.  Neck: Normal  range of motion. Neck supple. No JVD present. No tracheal deviation present. No thyromegaly present.  Cardiovascular: Normal rate, regular rhythm, normal heart sounds and intact distal pulses.  Exam reveals no gallop and no friction rub.   No murmur heard. Pulmonary/Chest: Effort normal and breath sounds normal. No stridor. No respiratory distress. He has no wheezes. He has no rales. He exhibits no tenderness.  Abdominal: Soft. Bowel sounds are normal. He exhibits no distension and no mass. There is no tenderness. There is no rebound and no guarding.  Musculoskeletal: Normal range of motion. He exhibits edema (2+ pitting edema in BLE). He exhibits no tenderness.  Lymphadenopathy:    He has no cervical adenopathy.  Neurological: He is oriented to person, place, and time.  Skin: Skin is warm and dry. No rash noted. He is not diaphoretic. No erythema. No pallor.  Psychiatric: He has a normal mood and affect. Judgment and thought content normal. His mood appears not anxious. His affect is not angry, not blunt, not labile and not inappropriate. His speech is delayed and tangential. His speech is not rapid and/or pressured and not slurred. He is slowed. He is not withdrawn and not actively hallucinating. Cognition and memory are not impaired. He does not exhibit a depressed mood. He is communicative. He exhibits abnormal recent memory. He exhibits normal remote memory. He is inattentive.      Lab Results  Component Value Date   WBC 16.7* 01/09/2013   HGB 10.6* 01/09/2013   HCT 31.6* 01/09/2013   PLT 97* 01/09/2013   GLUCOSE 151* 01/09/2013   CHOL 104 04/24/2012   TRIG 54 04/24/2012   HDL 31* 04/24/2012   LDLCALC 62 04/24/2012   ALT 19 10/13/2012   AST 28 10/13/2012   NA 136 01/09/2013   K 4.4 01/09/2013  CL 102 01/09/2013   CREATININE 2.15* 01/09/2013   BUN 45* 01/09/2013   CO2 25 01/09/2013   TSH 0.01* 12/08/2012   INR 1.94* 01/09/2013   HGBA1C 6.5 06/28/2011      Assessment & Plan:

## 2013-01-13 ENCOUNTER — Telehealth: Payer: Self-pay

## 2013-01-13 ENCOUNTER — Encounter: Payer: Self-pay | Admitting: Internal Medicine

## 2013-01-13 DIAGNOSIS — E039 Hypothyroidism, unspecified: Secondary | ICD-10-CM

## 2013-01-13 MED ORDER — LEVOTHYROXINE SODIUM 25 MCG PO TABS: 25.0000 ug | ORAL_TABLET | Freq: Every day | ORAL | Status: DC

## 2013-01-13 NOTE — Telephone Encounter (Signed)
Yes, he will be on this long term so Rx was changed He has borderline blood sugar elevation but not diabetes

## 2013-01-13 NOTE — Assessment & Plan Note (Signed)
TSH is up so I have asked him to start L-thyroxine

## 2013-01-13 NOTE — Telephone Encounter (Signed)
Please advise on the below questions. Thanks  Notes Recorded by Sandi Mealy, CMA on 01/13/2013 at 9:09 AM Spoke with daughter who would like to know the following; Is this a medication that he will need to take for a while? If so, is there a generic available due to expensive cost? Also at appointment MD mentioned diabetes and lab results from hospital, please advise further. Thanks

## 2013-01-13 NOTE — Assessment & Plan Note (Signed)
I will recheck his renal function today 

## 2013-01-13 NOTE — Telephone Encounter (Signed)
Message copied by Sandi Mealy on Tue Jan 13, 2013 10:40 AM ------      Message from: Etta Grandchild      Created: Tue Jan 13, 2013  7:36 AM       LA - please let his daughter know that he needs to start a thyroid hormone so I sent the prescription to his pharmacy, his renal function is stable.       ------

## 2013-01-13 NOTE — Telephone Encounter (Signed)
otc miralax

## 2013-01-13 NOTE — Assessment & Plan Note (Signed)
He has good rate and rhythm control today. 

## 2013-01-13 NOTE — Telephone Encounter (Signed)
Pt has constipation.  His wife wants to know what he can take for it with his heart and other medical problems.

## 2013-01-13 NOTE — Assessment & Plan Note (Signed)
I will recheck his A1C today and will treat if needed

## 2013-01-14 NOTE — Telephone Encounter (Signed)
Daughter Lorie notified [phone]//[id number]

## 2013-01-20 ENCOUNTER — Ambulatory Visit (INDEPENDENT_AMBULATORY_CARE_PROVIDER_SITE_OTHER): Payer: Medicare Other | Admitting: Cardiology

## 2013-01-20 ENCOUNTER — Encounter: Payer: Self-pay | Admitting: Cardiology

## 2013-01-20 VITALS — BP 100/60 | HR 60 | Ht 71.0 in | Wt 154.8 lb

## 2013-01-20 DIAGNOSIS — I10 Essential (primary) hypertension: Secondary | ICD-10-CM

## 2013-01-20 DIAGNOSIS — I5042 Chronic combined systolic (congestive) and diastolic (congestive) heart failure: Secondary | ICD-10-CM

## 2013-01-20 DIAGNOSIS — I4891 Unspecified atrial fibrillation: Secondary | ICD-10-CM

## 2013-01-20 NOTE — Progress Notes (Signed)
HPI The patient presents for followup of his cardiomyopathy. He was in the emergency room a couple of weeks ago with leg pain that was an acute exacerbation of his neuropathic pain. He was felt to be dehydrated but doesn't look like his diuretic was reduced. He did have followup blood work and I have reviewed the emergency room records and blood work that he has had none since then. Following this he's actually been doing well. His family is now going over twice a day given his medications and check on him. This has probably made a significant difference. He denies any ongoing chest pressure, neck or arm discomfort. He's not having any new PND or orthopnea. He doesn't describe any palpitations, presyncope or syncope.   Allergies  Allergen Reactions  . Ace Inhibitors     Cannot remember the reaction    Current Outpatient Prescriptions on File Prior to Visit  Medication Sig Dispense Refill  . carvedilol (COREG) 3.125 MG tablet Take 3.125 mg by mouth 2 (two) times daily with a meal.      . fenofibrate 54 MG tablet Take 1 tablet (54 mg total) by mouth daily.  30 tablet  6  . furosemide (LASIX) 20 MG tablet Take 20 mg by mouth daily.      Marland Kitchen L-Methylfolate-B6-B12 (FOLTANX) 3-35-2 MG TABS Take 1 tablet by mouth 2 (two) times daily.      Marland Kitchen levothyroxine (SYNTHROID, LEVOTHROID) 25 MCG tablet Take 1 tablet (25 mcg total) by mouth daily before breakfast.  90 tablet  1  . nitroGLYCERIN (NITROSTAT) 0.4 MG SL tablet Place 0.4 mg under the tongue every 5 (five) minutes as needed for chest pain.      Marland Kitchen warfarin (COUMADIN) 2.5 MG tablet Take 2.5-5 mg by mouth daily. 5 mg on Mondays. All other days, patient takes 2.5 mg       No current facility-administered medications on file prior to visit.    Past Medical History  Diagnosis Date  . Coronary artery disease     a.  s/p MI;  b. s/p CABG 2007;  c. cath 12/08: LM occluded, pRCA 80%, dRCA 50%, PLB 90%, S-PDA ok, S-Dx ok with occluded dist Dx; L-LAD ok,  S-OM ok with 90% after graft insertion . . . . distal OM treated with DES  . Ischemic cardiomyopathy     echo 6/12 EF 35-40%, apical, dAnt, mid to dist inf-lat HK, mild AI, mod MR, mod BAE, PASP 44  . Chronic combined systolic and diastolic heart failure     admx 8/13;   . Hypertension   . Atrial fibrillation     a. on coumadin.  . CKD (chronic kidney disease), stage IV   . CVA (cerebral infarction)     left frontal  . Anemia     chronic  . Hypothyroidism   . Leg muscle spasm   . GERD (gastroesophageal reflux disease)     Past Surgical History  Procedure Laterality Date  . Coronary artery bypass graft  2007  . Coronary angioplasty with stent placement      ROS  As stated in the HPI and negative for all other systems.  PHYSICAL EXAM:   BP 100/60  Pulse 60  Ht 5\' 11"  (1.803 m)  Wt 154 lb 12.8 oz (70.217 kg)  BMI 21.6 kg/m2 GENERAL:  Frail-appearing NECK:  No JVD at 45 degrees, waveform within normal limits, carotid upstroke brisk and symmetric, no bruits, no thyromegaly LUNGS:  Clear to auscultation bilaterally  BACK:  No CVA tenderness CHEST:  Healed sternotomy scar HEART:  PMI not displaced or sustained,,S1 and S2 within normal limits, no S3, no clicks, no rubs, no murmurs, irregluar ABD:  Flat, positive bowel sounds normal in frequency in pitch, no bruits, no rebound, no guarding, no midline pulsatile mass, no hepatomegaly, no splenomegaly EXT:  2 plus pulses throughout, mild edema, no cyanosis no clubbing, mild edema   ASSESSMENT:  ISCHEMIC CARDIOMYOPATHY -  I think that he is doing much better because his family is and now giving him his medicines and checking on him very closely. I think this is making a difference. No change in therapy is indicated. He should probably have blood work when he comes back in a couple of weeks.  CAD -  The patient has no new sypmtoms. No further cardiovascular testing is indicated. We will continue with aggressive risk reduction and  meds as listed.   Atrial fibrillation -  The patient tolerates this rhythm and rate control and anticoagulation.  We have had long discussions about the risks benefits of anticoagulation and for now given her risk of approximately 9% per year of stroke I think he should continue his warfarin.

## 2013-01-20 NOTE — Patient Instructions (Addendum)
The current medical regimen is effective;  continue present plan and medications.  Follow up as scheduled. 

## 2013-01-27 ENCOUNTER — Telehealth: Payer: Self-pay | Admitting: Cardiology

## 2013-01-27 NOTE — Telephone Encounter (Signed)
New problem   Per pts daughter his feet are very swollen

## 2013-01-27 NOTE — Telephone Encounter (Signed)
Spoke with pt's daughter. She reports she noticed swelling in pt's feet and ankles. Thinks this started evening of 6/1. No shortness of breath. Pt does not weigh daily but weight today was 154 and yesterday was 154.  Weight was 155 when here for last office visit with Dr. Antoine Poche on 5/27. Daughter reports pt tries to keep legs elevated but does not do as often as he should.  She also reports he drinks a lot of soft drinks and has been eating take-out food.  Daughter aware Dr. Antoine Poche is not in office today and that I will send note for him to review and make recommendations when back in office. Daughter agreeable with this plan.  She will call if pt develops shortness of breath or other symptoms.

## 2013-01-27 NOTE — Telephone Encounter (Signed)
OK to give an extra 20 mg of Lasix x 1.  Weight seems to be stable by this report.

## 2013-01-27 NOTE — Telephone Encounter (Signed)
Left message to call back  

## 2013-01-28 NOTE — Telephone Encounter (Signed)
left message for Jacki Cones to give extra 20 mg of Lasix today for the swelling.  Requested she call back with any questions however I will attempt to contact her again to make sure she gets the message.

## 2013-01-28 NOTE — Telephone Encounter (Signed)
[name] aware Wt 154 lbs but feet swelling.  She will call back if any other changes or needs

## 2013-01-28 NOTE — Telephone Encounter (Signed)
Left message to f/u

## 2013-02-03 ENCOUNTER — Ambulatory Visit (INDEPENDENT_AMBULATORY_CARE_PROVIDER_SITE_OTHER): Payer: Medicare Other | Admitting: Physician Assistant

## 2013-02-03 ENCOUNTER — Ambulatory Visit (INDEPENDENT_AMBULATORY_CARE_PROVIDER_SITE_OTHER): Payer: Medicare Other

## 2013-02-03 ENCOUNTER — Encounter: Payer: Self-pay | Admitting: Physician Assistant

## 2013-02-03 VITALS — BP 122/68 | HR 73 | Ht 71.0 in | Wt 151.8 lb

## 2013-02-03 DIAGNOSIS — I5042 Chronic combined systolic (congestive) and diastolic (congestive) heart failure: Secondary | ICD-10-CM

## 2013-02-03 DIAGNOSIS — N189 Chronic kidney disease, unspecified: Secondary | ICD-10-CM

## 2013-02-03 DIAGNOSIS — I4891 Unspecified atrial fibrillation: Secondary | ICD-10-CM

## 2013-02-03 DIAGNOSIS — E039 Hypothyroidism, unspecified: Secondary | ICD-10-CM

## 2013-02-03 DIAGNOSIS — Z8679 Personal history of other diseases of the circulatory system: Secondary | ICD-10-CM

## 2013-02-03 DIAGNOSIS — Z7901 Long term (current) use of anticoagulants: Secondary | ICD-10-CM

## 2013-02-03 LAB — POCT INR: INR: 2

## 2013-02-03 NOTE — Progress Notes (Signed)
[address], [address] [place], [place]  [address] Phone: [phone], Fax:  [phone]  Date:  02/03/2013   ID:  [name] Mans Sr., DOB 04-02-1920, MRN [id number]  PCP:  Sanda Linger, MD  Primary Cardiologist:  Dr. Rollene Rotunda     History of Present Illness: [name] G Osmond Sr. is a 79 y.o. male who returns for f/u on CHF.  He has a hx of CAD, s/p CABG in 2007, s/p stent to the OM in 07/2007, combined systolic and diastolic CHF, CKD and permanent AFib on chronic Coumadin therapy. I have followed him closely for volume overload over the past several months.  Recently his weight had dropped 20 lbs in 7 days.  He has a hx of non-compliance with his medications.  I was concerned that he had suddenly started to take his medications as prescribed.  I cut back on his diuretics.  He was then admitted with orthostatic hypotension.  He was given IVFs.  Torsemide and Imdur were held.  I then placed him back on a low dose of lasix due to increasing edema.  He was just seen by Dr. Rollene Rotunda recently.  He had been to the ED with LE neuropathic pain.  His CHF has been fairly stable.  His daughter and her husband have done an excellent job helping with his medications and monitoring his weights.  He was given an extra dose of Lasix last week.  LE edema seems stable.  No significant dyspnea.  He denies CP.  No syncope.  No orthopnea, PND. He is working with PT for balance and strength.  He uses his walker, reluctantly.      Labs (9/13):    K 3.3, Cr 2.7 Labs (10/13):  K 4.2, Cr 2.3 Labs (1/14):    K 5.1, Cr 1.9 Labs (2/14):    K 5 => 4.2, Cr 2.4 => 2.4 Labs (3/14):    K 3.9=>4.2, Cr 2.4 => 1.73=>1.9, Hgb 11, Plt 123K Labs (4/14):    K 4.4, Cr 1.9 => 2.1, Hgb 10.6, TSH 0.01 Labs (5/14):    K 4.3, Cr 2.2 => 2.15 => 2.2, Hgb 10.6, Plt 97K, TSH 8.87, FT3 2.3, FT4 5.5   Wt Readings from Last 3 Encounters:  02/03/13 151 lb 12.8 oz (68.856 kg)  01/20/13 154 lb 12.8 oz (70.217 kg)    01/12/13 155 lb (70.308 kg)     Past Medical History  Diagnosis Date  . Coronary artery disease     a.  s/p MI;  b. s/p CABG 2007;  c. cath 12/08: LM occluded, pRCA 80%, dRCA 50%, PLB 90%, S-PDA ok, S-Dx ok with occluded dist Dx; L-LAD ok, S-OM ok with 90% after graft insertion . . . . distal OM treated with DES  . Ischemic cardiomyopathy     echo 6/12 EF 35-40%, apical, dAnt, mid to dist inf-lat HK, mild AI, mod MR, mod BAE, PASP 44  . Chronic combined systolic and diastolic heart failure     admx 8/13;   . Hypertension   . Atrial fibrillation     a. on coumadin.  . CKD (chronic kidney disease), stage IV   . CVA (cerebral infarction)     left frontal  . Anemia     chronic  . Hypothyroidism   . Leg muscle spasm   . GERD (gastroesophageal reflux disease)     Current Outpatient Prescriptions  Medication Sig Dispense Refill  . carvedilol (COREG) 3.125 MG tablet Take  3.125 mg by mouth 2 (two) times daily with a meal.      . fenofibrate 54 MG tablet Take 1 tablet (54 mg total) by mouth daily.  30 tablet  6  . furosemide (LASIX) 20 MG tablet Take 20 mg by mouth daily.      Marland Kitchen L-Methylfolate-B6-B12 (FOLTANX) 3-35-2 MG TABS Take 1 tablet by mouth 2 (two) times daily.      Marland Kitchen levothyroxine (SYNTHROID, LEVOTHROID) 25 MCG tablet Take 1 tablet (25 mcg total) by mouth daily before breakfast.  90 tablet  1  . nitroGLYCERIN (NITROSTAT) 0.4 MG SL tablet Place 0.4 mg under the tongue every 5 (five) minutes as needed for chest pain.      Marland Kitchen warfarin (COUMADIN) 2.5 MG tablet Take 2.5-5 mg by mouth daily. 5 mg on Mondays. All other days, patient takes 2.5 mg       No current facility-administered medications for this visit.    Allergies:    Allergies  Allergen Reactions  . Ace Inhibitors     Cannot remember the reaction    Social History:  The patient  reports that he has never smoked. He has never used smokeless tobacco. He reports that he does not drink alcohol or use illicit drugs.   ROS:   Please see the history of present illness.  He notes choking on food a couple weeks ago.  No dysphagia.  Has not had a recurrence.   All other systems reviewed and negative.   PHYSICAL EXAM: VS:  BP 122/68  Pulse 73  Ht 5\' 11"  (1.803 m)  Wt 151 lb 12.8 oz (68.856 kg)  BMI 21.18 kg/m2  Well nourished, well developed, in no acute distress HEENT: normal Neck: no JVD   Cardiac:  normal S1, S2; irregularly irregular; no murmur Lungs:  Clear to auscultation bilaterally, no wheezing, rhonchi or rales Abd: soft, nontender  Ext: 1+bilateral ankle edema Skin: warm and dry  Neuro:  CNs 2-12 intact, no focal abnormalities noted  ECG:  AFib HR 73  ASSESSMENT AND PLAN:  1. Chronic Combined Systolic and Diastolic CHF:  Weights are fairly stable.  His daughter has helped him tremendously.  We discussed the importance of weighing regularly.  He can take extra Lasix if weight goes up >/= 3 lbs in one day. 2. Coronary Artery Disease:  No angina.  Continue current Rx. 3. Atrial Fibrillation:  Rate controlled.  He remains on coumadin. 4. Chronic Kidney Disease:  Creatinine has remained stable.   5. Hypothyroidism:  Managed by PCP.    6. Balance:  I have encouraged him to continue to use his walker and to continue working with PT. 7. Disposition:  Follow up with me in 2 mos.   Signed, Tereso Newcomer, PA-C  2:44 PM 02/03/2013

## 2013-02-03 NOTE — Patient Instructions (Addendum)
PLEASE FOLLOW UP WITH SCOTT WEAVER, [facility] 04/13/13 @ 2:20 PM  NO CHANGES WERE MADE TODAY

## 2013-02-16 ENCOUNTER — Ambulatory Visit: Payer: Medicare Other | Admitting: Cardiology

## 2013-03-01 ENCOUNTER — Telehealth: Payer: Self-pay | Admitting: Physician Assistant

## 2013-03-01 NOTE — Telephone Encounter (Signed)
[name] G Macy Sr. is a 79 y.o. male whom I know very well. His son in law called today b/c he became weak while standing for a prolonged period of time today. No symptoms c/w TIA. He sat down and had something to drink and felt better.  After eating he felt back to normal. BP was at his baseline. Patient's son in law advised to take him to the ED if this recurs.  Will see if he can get in for an appt this week for follow up. Tereso Newcomer, PA-C   03/01/2013 2:05 PM

## 2013-03-02 ENCOUNTER — Ambulatory Visit (INDEPENDENT_AMBULATORY_CARE_PROVIDER_SITE_OTHER): Payer: Medicare Other | Admitting: Physician Assistant

## 2013-03-02 ENCOUNTER — Ambulatory Visit (INDEPENDENT_AMBULATORY_CARE_PROVIDER_SITE_OTHER): Payer: Medicare Other | Admitting: *Deleted

## 2013-03-02 ENCOUNTER — Telehealth: Payer: Self-pay | Admitting: *Deleted

## 2013-03-02 ENCOUNTER — Encounter: Payer: Self-pay | Admitting: Physician Assistant

## 2013-03-02 VITALS — BP 101/53 | HR 60 | Ht 71.0 in | Wt 155.0 lb

## 2013-03-02 DIAGNOSIS — I4891 Unspecified atrial fibrillation: Secondary | ICD-10-CM

## 2013-03-02 DIAGNOSIS — I2510 Atherosclerotic heart disease of native coronary artery without angina pectoris: Secondary | ICD-10-CM

## 2013-03-02 DIAGNOSIS — Z8679 Personal history of other diseases of the circulatory system: Secondary | ICD-10-CM

## 2013-03-02 DIAGNOSIS — I5022 Chronic systolic (congestive) heart failure: Secondary | ICD-10-CM

## 2013-03-02 DIAGNOSIS — I951 Orthostatic hypotension: Secondary | ICD-10-CM

## 2013-03-02 DIAGNOSIS — Z7901 Long term (current) use of anticoagulants: Secondary | ICD-10-CM

## 2013-03-02 DIAGNOSIS — N184 Chronic kidney disease, stage 4 (severe): Secondary | ICD-10-CM

## 2013-03-02 LAB — POCT INR: INR: 2

## 2013-03-02 NOTE — Telephone Encounter (Signed)
lmom per [employer]. PAC to have pt come in  today  7/7  pm. Lorin Picket s/w family yesterday and advised for pt to be seen today

## 2013-03-02 NOTE — Patient Instructions (Addendum)
Wear your compression stockings EVERY DAY.  Take frequent breaks to sit down.  Keep legs propped up when sitting.  Pump your calf muscles if you go from lying or sitting for a while to standing up.  LABS TODAY BMET   KEEP YOUR 03/2013 APPT WITH [place], [facility]

## 2013-03-02 NOTE — Telephone Encounter (Signed)
Seeing Tereso Newcomer, [place] today at 3

## 2013-03-02 NOTE — Telephone Encounter (Signed)
Pt coming in today at 3pm

## 2013-03-02 NOTE — Progress Notes (Signed)
[address] [place], [place]  [address] Phone: [phone], Fax:  [phone]  Date:  03/02/2013   ID:  [name] Mans Sr., DOB 25-Nov-1919, MRN [id number]  PCP:  Sanda Linger, MD  Primary Cardiologist:  Dr. Rollene Rotunda     History of Present Illness: [name] G Kalish Sr. is a 79 y.o. male who returns for f/u.  He has a hx of CAD, s/p CABG in 2007, s/p stent to the OM in 07/2007, combined systolic and diastolic CHF, CKD and permanent AFib on chronic Coumadin therapy. I have followed him closely for volume overload over the past several months.  Recently his weight had dropped 20 lbs in 7 days.  He has a hx of non-compliance with his medications.  I was concerned that he had suddenly started to take his medications as prescribed.  I cut back on his diuretics.  He was then admitted with orthostatic hypotension.  He was given IVFs.  Torsemide and Imdur were held.  I then placed him back on a low dose of lasix due to increasing edema.  His daughter and her husband have done an excellent job helping with his medications and monitoring his weights.  His family called in yesterday after he became confused.  He had been standing for over an hour while his son in law did some electrical work.  [name] wanted to watch what they were doing.  He felt better after sitting down and drinking water.  He was back to normal after eating.  He had not eaten for several hours.  There was no syncope.  There was no facial drooping, dysarthria, unilateral weakness.  He denies CP.  DOE is unchanged.  No orthopnea, PND.  Edema is unchanged.  Weight is stable at home.    Labs (9/13):    K 3.3, Cr 2.7 Labs (10/13):  K 4.2, Cr 2.3 Labs (1/14):    K 5.1, Cr 1.9 Labs (2/14):    K 5 => 4.2, Cr 2.4 => 2.4 Labs (3/14):    K 3.9=>4.2, Cr 2.4 => 1.73=>1.9, Hgb 11, Plt 123K Labs (4/14):    K 4.4, Cr 1.9 => 2.1, Hgb 10.6, TSH 0.01 Labs (5/14):    K 4.3, Cr 2.2 => 2.15 => 2.2, Hgb 10.6, Plt 97K, TSH  8.87, FT3 2.3, FT4 5.5   Wt Readings from Last 3 Encounters:  03/02/13 155 lb (70.308 kg)  02/03/13 151 lb 12.8 oz (68.856 kg)  01/20/13 154 lb 12.8 oz (70.217 kg)     Past Medical History  Diagnosis Date  . Coronary artery disease     a.  s/p MI;  b. s/p CABG 2007;  c. cath 12/08: LM occluded, pRCA 80%, dRCA 50%, PLB 90%, S-PDA ok, S-Dx ok with occluded dist Dx; L-LAD ok, S-OM ok with 90% after graft insertion . . . . distal OM treated with DES  . Ischemic cardiomyopathy     echo 6/12 EF 35-40%, apical, dAnt, mid to dist inf-lat HK, mild AI, mod MR, mod BAE, PASP 44  . Chronic combined systolic and diastolic heart failure     admx 8/13;   . Hypertension   . Atrial fibrillation     a. on coumadin.  . CKD (chronic kidney disease), stage IV   . CVA (cerebral infarction)     left frontal  . Anemia     chronic  . Hypothyroidism   . Leg muscle spasm   . GERD (gastroesophageal reflux  disease)     Current Outpatient Prescriptions  Medication Sig Dispense Refill  . carvedilol (COREG) 3.125 MG tablet Take 3.125 mg by mouth 2 (two) times daily with a meal.      . fenofibrate 54 MG tablet Take 1 tablet (54 mg total) by mouth daily.  30 tablet  6  . furosemide (LASIX) 20 MG tablet Take 20 mg by mouth daily.      Marland Kitchen L-Methylfolate-B6-B12 (FOLTANX) 3-35-2 MG TABS Take 1 tablet by mouth 2 (two) times daily.      Marland Kitchen levothyroxine (SYNTHROID, LEVOTHROID) 25 MCG tablet Take 1 tablet (25 mcg total) by mouth daily before breakfast.  90 tablet  1  . nitroGLYCERIN (NITROSTAT) 0.4 MG SL tablet Place 0.4 mg under the tongue every 5 (five) minutes as needed for chest pain.      Marland Kitchen warfarin (COUMADIN) 2.5 MG tablet Take 2.5-5 mg by mouth daily. 5 mg on Mondays. All other days, patient takes 2.5 mg       No current facility-administered medications for this visit.    Allergies:    Allergies  Allergen Reactions  . Ace Inhibitors     Cannot remember the reaction    Social History:  The patient   reports that he has never smoked. He has never used smokeless tobacco. He reports that he does not drink alcohol or use illicit drugs.   ROS:  Please see the history of present illness.     All other systems reviewed and negative.   PHYSICAL EXAM: VS:  BP 101/53  Pulse 60  Ht 5\' 11"  (1.803 m)  Wt 155 lb (70.308 kg)  BMI 21.63 kg/m2  Filed Vitals:   03/02/13 1531 03/02/13 1532 03/02/13 1533 03/02/13 1534  BP: 105/51 101/53 93/46 101/53  Pulse: 74 73 74 60  Height:    5\' 11"  (1.803 m)  Weight:    155 lb (70.308 kg)     Well nourished, well developed, in no acute distress HEENT: normal Neck: no JVD   Cardiac:  normal S1, S2; irregularly irregular; no murmur Lungs:  Clear to auscultation bilaterally, no wheezing, rhonchi or rales Abd: soft, nontender  Ext: 1+bilateral ankle edema Skin: warm and dry  Neuro:  CNs 2-12 intact, no focal abnormalities noted  ECG:  AFib HR 79  ASSESSMENT AND PLAN:  1. Orthostatic Hypotension:  His BPs drop with standing.  But, it is not that bad.  I suspect he was standing for too long yesterday.  He is not wearing his compression stockings.  He is not keeping his legs elevated.  I recommended he keep his compression stockings on and to pump his calves prior to standing.  He should also take frequent breaks to sit down.  Check BMET today.  2. Chronic Combined Systolic and Diastolic CHF:  Weights are fairly stable.  His daughter has helped him tremendously.  He knows to weigh regularly.   3. Coronary Artery Disease:  No angina.  Continue current Rx. 4. Atrial Fibrillation:  Rate controlled.  He remains on coumadin. 5. Chronic Kidney Disease:  Creatinine has remained stable. Check BMET today.  6. Hypothyroidism:  Managed by PCP.    7. Disposition:  Follow up with me as planned.   Luna Glasgow, PA-C  3:44 PM 03/02/2013

## 2013-03-03 ENCOUNTER — Telehealth: Payer: Self-pay | Admitting: *Deleted

## 2013-03-03 LAB — BASIC METABOLIC PANEL
BUN: 34 mg/dL — ABNORMAL HIGH (ref 6–23)
Chloride: 110 mEq/L (ref 96–112)
Creatinine, Ser: 2.1 mg/dL — ABNORMAL HIGH (ref 0.4–1.5)
GFR: 31.62 mL/min — ABNORMAL LOW (ref >60.00)
Potassium: 4.2 mEq/L (ref 3.5–5.1)

## 2013-03-03 NOTE — Telephone Encounter (Signed)
Message copied by Tarri Fuller on Tue Mar 03, 2013  5:57 PM ------      Message from: [place], [place] T      Created: Tue Mar 03, 2013  5:39 PM       Stable      Continue with current treatment plan.      Tereso Newcomer, PA-C        03/03/2013 5:39 PM ------

## 2013-03-03 NOTE — Telephone Encounter (Signed)
Daughter Jacki Cones notified about lab results due to pt hard of hearing, Jacki Cones gave me vreabl understanding to results today

## 2013-03-10 ENCOUNTER — Telehealth: Payer: Self-pay | Admitting: Cardiology

## 2013-03-10 NOTE — Telephone Encounter (Signed)
New Prob     Calling to see if pt is still supposed to be taking SIMVASTATIN. Please call,.

## 2013-03-10 NOTE — Telephone Encounter (Signed)
No was d/ced 10/20/12 due to muscle weakness.  Lauren aware

## 2013-03-14 ENCOUNTER — Other Ambulatory Visit: Payer: Self-pay | Admitting: Cardiology

## 2013-03-23 ENCOUNTER — Telehealth: Payer: Self-pay | Admitting: Cardiology

## 2013-03-23 NOTE — Telephone Encounter (Signed)
New Prob     Daughter has a question regarding pt swelling feet. Please call.

## 2013-03-23 NOTE — Telephone Encounter (Signed)
LMTCB

## 2013-03-24 NOTE — Telephone Encounter (Signed)
F/u   pts daughter calling back-if she can't be reached please leave detailed msg as to what she can do

## 2013-03-24 NOTE — Telephone Encounter (Signed)
I spoke with the pt's daughter and she said the pt's weight has been 153-154 lbs.  The pt does have some swelling in his feet. Jacki Cones said the pt has been wearing compression stockings but not as directed. The pt also has eaten some foods with salt.  I made Jacki Cones aware that the pt needs to wear compression stockings, elevate feet, and avoid salt.  Jacki Cones will contact our office if swelling does not improve or pt develops SOB.  The pt will continue to weigh daily and call back with weight gain.

## 2013-03-30 ENCOUNTER — Ambulatory Visit (INDEPENDENT_AMBULATORY_CARE_PROVIDER_SITE_OTHER): Payer: Medicare Other | Admitting: *Deleted

## 2013-03-30 DIAGNOSIS — Z8679 Personal history of other diseases of the circulatory system: Secondary | ICD-10-CM

## 2013-03-30 DIAGNOSIS — Z7901 Long term (current) use of anticoagulants: Secondary | ICD-10-CM

## 2013-03-30 DIAGNOSIS — I4891 Unspecified atrial fibrillation: Secondary | ICD-10-CM

## 2013-03-30 LAB — POCT INR: INR: 1.7

## 2013-04-08 ENCOUNTER — Ambulatory Visit (INDEPENDENT_AMBULATORY_CARE_PROVIDER_SITE_OTHER): Payer: Medicare Other | Admitting: *Deleted

## 2013-04-08 DIAGNOSIS — Z7901 Long term (current) use of anticoagulants: Secondary | ICD-10-CM

## 2013-04-08 DIAGNOSIS — I4891 Unspecified atrial fibrillation: Secondary | ICD-10-CM

## 2013-04-08 DIAGNOSIS — Z8679 Personal history of other diseases of the circulatory system: Secondary | ICD-10-CM

## 2013-04-08 LAB — POCT INR: INR: 2.1

## 2013-04-13 ENCOUNTER — Ambulatory Visit (INDEPENDENT_AMBULATORY_CARE_PROVIDER_SITE_OTHER): Payer: Medicare Other | Admitting: Pharmacist

## 2013-04-13 ENCOUNTER — Ambulatory Visit (INDEPENDENT_AMBULATORY_CARE_PROVIDER_SITE_OTHER): Payer: Medicare Other | Admitting: Physician Assistant

## 2013-04-13 ENCOUNTER — Encounter: Payer: Self-pay | Admitting: Physician Assistant

## 2013-04-13 VITALS — BP 138/70 | HR 64 | Ht 71.5 in | Wt 154.0 lb

## 2013-04-13 DIAGNOSIS — I4891 Unspecified atrial fibrillation: Secondary | ICD-10-CM

## 2013-04-13 DIAGNOSIS — I5022 Chronic systolic (congestive) heart failure: Secondary | ICD-10-CM

## 2013-04-13 DIAGNOSIS — I2510 Atherosclerotic heart disease of native coronary artery without angina pectoris: Secondary | ICD-10-CM

## 2013-04-13 DIAGNOSIS — Z7901 Long term (current) use of anticoagulants: Secondary | ICD-10-CM

## 2013-04-13 DIAGNOSIS — Z8679 Personal history of other diseases of the circulatory system: Secondary | ICD-10-CM

## 2013-04-13 DIAGNOSIS — N184 Chronic kidney disease, stage 4 (severe): Secondary | ICD-10-CM

## 2013-04-13 DIAGNOSIS — E039 Hypothyroidism, unspecified: Secondary | ICD-10-CM

## 2013-04-13 NOTE — Progress Notes (Signed)
[address] [place], [place]  [address] Phone: [phone] Fax:  [phone]  Date:  04/13/2013   ID:  [name] Mans Sr., DOB July 03, 1920, MRN [id number]  PCP:  Sanda Linger, MD  Primary Cardiologist:  Dr. Rollene Rotunda     History of Present Illness: [name] G Grisso Sr. is a 79 y.o. male who returns for f/u.  He has a hx of CAD, s/p CABG in 2007, s/p stent to the OM in 07/2007, combined systolic and diastolic CHF, CKD and permanent AFib on chronic Coumadin therapy. I have followed him closely for volume overload over the past several months.  Recently his weight had dropped 20 lbs in 7 days.  He has a hx of non-compliance with his medications.  I was concerned that he had suddenly started to take his medications as prescribed.  I cut back on his diuretics.  He was then admitted with orthostatic hypotension.  He was given IVFs.  Torsemide and Imdur were held.  I then placed him back on a low dose of lasix due to increasing edema.  His daughter and her husband have done an excellent job helping with his medications and monitoring his weights.  Last seen 03/02/13 after an episode of confusion at home. His blood pressures did drop from lying to standing. I advised him to keep his legs elevated and wear his compression stockings.  Doing well.  The patient denies chest pain, shortness of breath, syncope, orthopnea, PND or significant pedal edema.  He needs a squamous cell CA excised from his right face this week.   Labs (9/13):    K 3.3, Cr 2.7 Labs (10/13):  K 4.2, Cr 2.3 Labs (1/14):    K 5.1, Cr 1.9 Labs (2/14):    K 5 => 4.2, Cr 2.4 => 2.4 Labs (3/14):    K 3.9=>4.2, Cr 2.4 => 1.73=>1.9, Hgb 11, Plt 123K Labs (4/14):    K 4.4, Cr 1.9 => 2.1, Hgb 10.6, TSH 0.01 Labs (5/14):    K 4.3, Cr 2.2 => 2.15 => 2.2, Hgb 10.6, Plt 97K, TSH 8.87, FT3 2.3, FT4 5.5  Labs (7/14):    K 4.2, Cr 2.1   Wt Readings from Last 3 Encounters:  04/13/13 154 lb (69.854 kg)  03/02/13 155 lb  (70.308 kg)  02/03/13 151 lb 12.8 oz (68.856 kg)     Past Medical History  Diagnosis Date  . Coronary artery disease     a.  s/p MI;  b. s/p CABG 2007;  c. cath 12/08: LM occluded, pRCA 80%, dRCA 50%, PLB 90%, S-PDA ok, S-Dx ok with occluded dist Dx; L-LAD ok, S-OM ok with 90% after graft insertion . . . . distal OM treated with DES  . Ischemic cardiomyopathy     echo 6/12 EF 35-40%, apical, dAnt, mid to dist inf-lat HK, mild AI, mod MR, mod BAE, PASP 44  . Chronic combined systolic and diastolic heart failure     admx 8/13;   . Hypertension   . Atrial fibrillation     a. on coumadin.  . CKD (chronic kidney disease), stage IV   . CVA (cerebral infarction)     left frontal  . Anemia     chronic  . Hypothyroidism   . Leg muscle spasm   . GERD (gastroesophageal reflux disease)   . Skin cancer     Current Outpatient Prescriptions  Medication Sig Dispense Refill  . carvedilol (COREG) 3.125 MG tablet Take 3.125  mg by mouth 2 (two) times daily with a meal.      . fenofibrate 54 MG tablet Take 1 tablet (54 mg total) by mouth daily.  30 tablet  6  . furosemide (LASIX) 20 MG tablet Take 20 mg by mouth daily.      Marland Kitchen L-Methylfolate-B6-B12 (FOLTANX) 3-35-2 MG TABS Take 1 tablet by mouth 2 (two) times daily.      Marland Kitchen levothyroxine (SYNTHROID, LEVOTHROID) 25 MCG tablet Take 1 tablet (25 mcg total) by mouth daily before breakfast.  90 tablet  1  . nitroGLYCERIN (NITROSTAT) 0.4 MG SL tablet Place 0.4 mg under the tongue every 5 (five) minutes as needed for chest pain.      Marland Kitchen warfarin (COUMADIN) 2.5 MG tablet TAKE AS DIRECTED BY ANTICOAGULATION CLINIC  40 tablet  3   No current facility-administered medications for this visit.    Allergies:    Allergies  Allergen Reactions  . Ace Inhibitors     Cannot remember the reaction    Social History:  The patient  reports that he has never smoked. He has never used smokeless tobacco. He reports that he does not drink alcohol or use illicit drugs.    ROS:  Please see the history of present illness.    All other systems reviewed and negative.   PHYSICAL EXAM: VS:  BP 138/70  Pulse 64  Ht 5' 11.5" (1.816 m)  Wt 154 lb (69.854 kg)  BMI 21.18 kg/m2  Well nourished, well developed, in no acute distress HEENT: normal Neck: no JVD   Cardiac:  normal S1, S2; irregularly irregular; no murmur Lungs:  Clear to auscultation bilaterally, no wheezing, rhonchi or rales Abd: soft, nontender  Ext: 1+bilateral ankle edema Skin: warm and dry  Neuro:  CNs 2-12 intact, no focal abnormalities noted   ASSESSMENT AND PLAN:  1. Chronic Combined Systolic and Diastolic CHF:  Volume stable.  Continue current Rx.  He has been drinking a lot of Ensure QD.  I have advised him to limit this as much as possible.  2. Coronary Artery Disease:  No angina.  Continue current Rx. 3. Atrial Fibrillation:  Rate controlled.  He remains on coumadin.  He has skin CA surgery scheduled this week.  Will have him come back early next week for INR. 4. Chronic Kidney Disease:  Creatinine has remained stable.   5. Hypothyroidism:  Managed by PCP.    6. Disposition:  Follow up with Dr. Rollene Rotunda in 3 mos.   Signed, Tereso Newcomer, PA-C  3:12 PM 04/13/2013

## 2013-04-13 NOTE — Patient Instructions (Addendum)
NO CHANGES WERE MADE TODAY WITH YOUR MEDICATIONS  PLEASE FOLLOW UP WITH DR. HOCHREIN IN 3 MONTHS  NEED TO RESCHEDULE YOUR COUMADIN APPT TO ONE NEXT WEEK SINCE YOU ARE HAVING A SKIN CANCER REMOVED

## 2013-04-20 ENCOUNTER — Ambulatory Visit (INDEPENDENT_AMBULATORY_CARE_PROVIDER_SITE_OTHER): Payer: Medicare Other | Admitting: Pharmacist

## 2013-04-20 DIAGNOSIS — Z8679 Personal history of other diseases of the circulatory system: Secondary | ICD-10-CM

## 2013-04-20 DIAGNOSIS — Z7901 Long term (current) use of anticoagulants: Secondary | ICD-10-CM

## 2013-04-20 DIAGNOSIS — I4891 Unspecified atrial fibrillation: Secondary | ICD-10-CM

## 2013-05-06 ENCOUNTER — Other Ambulatory Visit: Payer: Self-pay | Admitting: Neurology

## 2013-05-06 ENCOUNTER — Ambulatory Visit (INDEPENDENT_AMBULATORY_CARE_PROVIDER_SITE_OTHER): Payer: Medicare Other | Admitting: Internal Medicine

## 2013-05-06 ENCOUNTER — Encounter: Payer: Self-pay | Admitting: Internal Medicine

## 2013-05-06 ENCOUNTER — Other Ambulatory Visit (INDEPENDENT_AMBULATORY_CARE_PROVIDER_SITE_OTHER): Payer: Medicare Other

## 2013-05-06 VITALS — BP 110/70 | HR 77 | Temp 98.1°F | Resp 16 | Wt 155.0 lb

## 2013-05-06 DIAGNOSIS — N138 Other obstructive and reflux uropathy: Secondary | ICD-10-CM

## 2013-05-06 DIAGNOSIS — I10 Essential (primary) hypertension: Secondary | ICD-10-CM

## 2013-05-06 DIAGNOSIS — N184 Chronic kidney disease, stage 4 (severe): Secondary | ICD-10-CM

## 2013-05-06 DIAGNOSIS — N410 Acute prostatitis: Secondary | ICD-10-CM

## 2013-05-06 DIAGNOSIS — I4891 Unspecified atrial fibrillation: Secondary | ICD-10-CM

## 2013-05-06 DIAGNOSIS — N139 Obstructive and reflux uropathy, unspecified: Secondary | ICD-10-CM

## 2013-05-06 DIAGNOSIS — N401 Enlarged prostate with lower urinary tract symptoms: Secondary | ICD-10-CM | POA: Insufficient documentation

## 2013-05-06 DIAGNOSIS — E039 Hypothyroidism, unspecified: Secondary | ICD-10-CM

## 2013-05-06 DIAGNOSIS — D539 Nutritional anemia, unspecified: Secondary | ICD-10-CM

## 2013-05-06 DIAGNOSIS — Z23 Encounter for immunization: Secondary | ICD-10-CM

## 2013-05-06 LAB — URINALYSIS, ROUTINE W REFLEX MICROSCOPIC
Leukocytes, UA: NEGATIVE
Nitrite: NEGATIVE
Specific Gravity, Urine: 1.01 (ref 1.000–1.030)
Urobilinogen, UA: 0.2 (ref 0.0–1.0)

## 2013-05-06 LAB — COMPREHENSIVE METABOLIC PANEL
AST: 22 U/L (ref 0–37)
Albumin: 4 g/dL (ref 3.5–5.2)
Alkaline Phosphatase: 37 U/L — ABNORMAL LOW (ref 39–117)
BUN: 43 mg/dL — ABNORMAL HIGH (ref 6–23)
Calcium: 8.6 mg/dL (ref 8.4–10.5)
Chloride: 108 mEq/L (ref 96–112)
Glucose, Bld: 84 mg/dL (ref 70–99)
Potassium: 4.3 mEq/L (ref 3.5–5.1)
Sodium: 139 mEq/L (ref 135–145)
Total Protein: 6.9 g/dL (ref 6.0–8.3)

## 2013-05-06 LAB — CBC WITH DIFFERENTIAL/PLATELET
Eosinophils Absolute: 0.2 10*3/uL (ref 0.0–0.7)
Lymphocytes Relative: 21.7 % (ref 12.0–46.0)
MCHC: 33.7 g/dL (ref 30.0–36.0)
MCV: 94.5 fl (ref 78.0–100.0)
Monocytes Absolute: 0.4 10*3/uL (ref 0.1–1.0)
Neutrophils Relative %: 67.1 % (ref 43.0–77.0)
Platelets: 110 10*3/uL — ABNORMAL LOW (ref 150.0–400.0)
RBC: 3.18 Mil/uL — ABNORMAL LOW (ref 4.22–5.81)
WBC: 6.2 10*3/uL (ref 4.5–10.5)

## 2013-05-06 MED ORDER — SILODOSIN 8 MG PO CAPS: 8.0000 mg | ORAL_CAPSULE | Freq: Every day | ORAL | Status: DC

## 2013-05-06 MED ORDER — CIPROFLOXACIN HCL 500 MG PO TABS: 500.0000 mg | ORAL_TABLET | Freq: Two times a day (BID) | ORAL | Status: DC

## 2013-05-06 NOTE — Assessment & Plan Note (Signed)
He has good rate control today and fluid status appears stable

## 2013-05-06 NOTE — Assessment & Plan Note (Signed)
Will check his UA and urine to try to ID the infection I think cipro is a good choice empirically to treat this

## 2013-05-06 NOTE — Assessment & Plan Note (Signed)
I will recheck his TSH and will adjust his dose if needed 

## 2013-05-06 NOTE — Patient Instructions (Signed)
Prostatitis  The prostate gland is about the size and shape of a walnut. It is located just below your bladder. It produces one of the components of semen, which is made up of sperm and the fluids that help nourish and transport it out from the testicles. Prostatitis is redness, soreness, and swelling (inflammation) of the prostate gland.   There are 3 types of prostatitis:  · Acute bacterial prostatitis This is the least common type of prostatitis. It starts quickly and usually leads to a bladder infection. It can occur at any age.  · Chronic bacterial prostatitis This is a persistent bacterial infection in the prostate.  It usually develops from repeated acute bacterial prostatitis or acute bacterial prostatitis that was not properly treated. It can occur in men of any age but is most common in middle-aged men whose prostate has begun to enlarge.   · Chronic prostatitis chronic pelvic pain syndrome This is the most common type of prostatitis. It is inflammation of the prostate gland that is not caused by a bacterial infection. The cause is unknown.  CAUSES  The cause of acute and chronic bacterial prostatitis is a bacterial infection. The exact cause of chronic prostatitis and chronic pelvic pain syndrome and asymptomatic inflammatory prostatitis is unknown.   SYMPTOMS   Symptoms can vary depending upon the type of prostatitis that exists. There can also be overlap in symptoms. Possible symptoms for each type of prostatitis are listed below.  Acute bacterial prostatitis  · Painful urination.  · Fever or chills.  · Muscle or joint pains.  · Low back pain.  · Low abdominal pain.  · Inability to empty bladder completely.  · Sudden urge to urinate.  · Frequent urination.  · Difficulty starting urine stream.  · Weak urine stream.  · Discharge from the urethra.  · Dribbling after urination.  · Rectal pain.  · Pain in the testicles, penis, or tip of the penis.  · Pain in the space between the anus and scrotum  (perineum).  · Problems with sexual function.  · Painful ejaculation.  · Bloody semen.  Chronic bacterial prostatitis  · The symptoms are similar to those of acute bacterial prostatitis, but they usually are much less severe. Fever, chills, and muscle and joint pain are not associated with chronic bacterial prostatitis.  Chronic prostatitis chronic pelvic pain syndrome  · Symptoms typically include a dull ache in the scrotum and the perineum.  DIAGNOSIS   In order to diagnose prostatitis, your caregiver will ask about your symptoms. If acute or chronic bacterial prostatitis is suspected, a urine sample will be taken and tested (urinalysis). This is to see if there is bacteria in your urine. If the urinalysis result is negative for bacteria, your caregiver may use a finger to feel your prostate (digital rectal exam). This exam helps your caregiver determine if your prostate is swollen and tender.  TREATMENT   Treatment for prostatitis depends on the cause. If a bacterial infection is the cause, it can be treated with antibiotic medicine. In cases of chronic bacterial prostatitis, the use of antibiotics for up to 1 month may be necessary. Your caregiver may instruct you to take sitz baths to help relieve pain. A sitz bath is a bath of hot water in which your hips and buttocks are under water.  HOME CARE INSTRUCTIONS   · Take all medicines as directed by your caregiver.  · Take sitz baths as directed by your caregiver.  SEEK MEDICAL CARE   IF:   · Your symptoms get worse, not better.  · You have a fever.  SEEK IMMEDIATE MEDICAL CARE IF:   · You have chills.  · You feel nauseous or vomit.  · You feel lightheaded or faint.  · You are unable to urinate.  · You have blood or blood clots in your urine.  Document Released: 08/10/2000 Document Revised: 11/05/2011 Document Reviewed: 07/16/2011  [employer]® Patient Information ©2014 [employer].

## 2013-05-06 NOTE — Assessment & Plan Note (Signed)
I will recheck his CBC I will WBC remains than will d/w his DTR whether or not to pursue work-up further

## 2013-05-06 NOTE — Assessment & Plan Note (Signed)
He will start Rapaflo

## 2013-05-06 NOTE — Progress Notes (Signed)
Subjective:    Patient ID: [name] Mans Sr., male    DOB: [birth year], 79 y.o.   MRN: [id number]  Benign Prostatic Hypertrophy This is a recurrent problem. The current episode started more than 1 year ago. The problem has been rapidly worsening since onset. Irritative symptoms do not include frequency, nocturia or urgency. Obstructive symptoms include dribbling, incomplete emptying, an intermittent stream, a slower stream, straining and a weak stream. Pertinent negatives include no chills, dysuria, genital pain, hematuria, hesitancy, nausea or vomiting. AUA score is 8-19. He is not sexually active. Nothing aggravates the symptoms. Past treatments include nothing.      Review of Systems  Constitutional: Positive for fatigue. Negative for fever, chills, diaphoresis, activity change, appetite change and unexpected weight change.  HENT: Negative.   Eyes: Negative.   Respiratory: Negative.  Negative for cough, choking, chest tightness, shortness of breath, wheezing and stridor.   Cardiovascular: Positive for leg swelling (unchanged, mild edema around his ankles). Negative for chest pain and palpitations.  Gastrointestinal: Negative.  Negative for nausea, vomiting, abdominal pain, diarrhea, constipation and abdominal distention.  Endocrine: Negative.   Genitourinary: Positive for incomplete emptying. Negative for dysuria, hesitancy, urgency, frequency, hematuria and nocturia.  Musculoskeletal: Negative.   Skin: Negative.   Allergic/Immunologic: Negative.   Neurological: Negative.  Negative for dizziness, syncope, speech difficulty, light-headedness and headaches.  Hematological: Negative.  Negative for adenopathy. Does not bruise/bleed easily.  Psychiatric/Behavioral: Negative.        Objective:   Physical Exam  Vitals reviewed. Constitutional: He is oriented to person, place, and time. He appears well-developed and well-nourished. No distress.  HENT:  Head: Normocephalic and atraumatic.   Mouth/Throat: Oropharynx is clear and moist. No oropharyngeal exudate.  Eyes: Conjunctivae are normal. Right eye exhibits no discharge. Left eye exhibits no discharge. No scleral icterus.  Neck: Normal range of motion. Neck supple. No JVD present. No tracheal deviation present. No thyromegaly present.  Cardiovascular: Normal rate, normal heart sounds and intact distal pulses.  An irregularly irregular rhythm present. Exam reveals no gallop and no friction rub.   No murmur heard. Pulses:      Carotid pulses are 1+ on the right side, and 1+ on the left side.      Radial pulses are 1+ on the right side, and 1+ on the left side.       Femoral pulses are 1+ on the right side, and 1+ on the left side.      Popliteal pulses are 1+ on the right side, and 1+ on the left side.       Dorsalis pedis pulses are 1+ on the right side, and 1+ on the left side.       Posterior tibial pulses are 1+ on the right side, and 1+ on the left side.  Pulmonary/Chest: Effort normal and breath sounds normal. No stridor. No respiratory distress. He has no wheezes. He has no rales. He exhibits no tenderness.  Abdominal: Soft. Bowel sounds are normal. He exhibits no distension and no mass. There is no tenderness. There is no rebound and no guarding. Hernia confirmed negative in the right inguinal area and confirmed negative in the left inguinal area.  Genitourinary: Testes normal and penis normal. Rectal exam shows external hemorrhoid. Rectal exam shows no internal hemorrhoid, no fissure, no mass, no tenderness and anal tone normal. Guaiac negative stool. Prostate is enlarged (3+ BPH with bogginess and slight assymetry) and tender. Right testis shows no mass, no swelling and no tenderness.  Right testis is descended. Left testis shows no mass, no swelling and no tenderness. Left testis is descended. Circumcised. No penile erythema or penile tenderness. No discharge found.  Musculoskeletal: Normal range of motion. He exhibits edema  (1+ pitting edema around both ankles). He exhibits no tenderness.  Lymphadenopathy:    He has no cervical adenopathy.       Right: No inguinal adenopathy present.       Left: No inguinal adenopathy present.  Neurological: He is oriented to person, place, and time.  Skin: Skin is warm and dry. No rash noted. He is not diaphoretic. No erythema. No pallor.  Psychiatric: He has a normal mood and affect. Judgment and thought content normal. His speech is delayed and tangential. He is slowed. He is not withdrawn and not actively hallucinating. Cognition and memory are impaired. He exhibits abnormal recent memory and abnormal remote memory. He is inattentive.     Lab Results  Component Value Date   WBC 16.7* 01/09/2013   HGB 10.6* 01/09/2013   HCT 31.6* 01/09/2013   PLT 97* 01/09/2013   GLUCOSE 89 03/02/2013   CHOL 104 04/24/2012   TRIG 54 04/24/2012   HDL 31* 04/24/2012   LDLCALC 62 04/24/2012   ALT 19 10/13/2012   AST 28 10/13/2012   NA 142 03/02/2013   K 4.2 03/02/2013   CL 110 03/02/2013   CREATININE 2.1* 03/02/2013   BUN 34* 03/02/2013   CO2 25 03/02/2013   TSH 8.87* 01/12/2013   INR 2.4 04/20/2013   HGBA1C 5.9 01/12/2013       Assessment & Plan:

## 2013-05-07 ENCOUNTER — Telehealth: Payer: Self-pay | Admitting: *Deleted

## 2013-05-07 NOTE — Telephone Encounter (Signed)
He can take cipro, he will need to monitor his INR blood work closely

## 2013-05-07 NOTE — Telephone Encounter (Signed)
Jacki Cones called states per pharmacy, Cipro Rx is not compatible with Warfarin.  Please advise of different Rx.

## 2013-05-07 NOTE — Telephone Encounter (Signed)
Spoke with Vernona Rieger advised of MDs order

## 2013-05-08 ENCOUNTER — Telehealth: Payer: Self-pay

## 2013-05-08 NOTE — Telephone Encounter (Signed)
YES, start Cipro Please bring him back to the coumadin clinic next week to check his coumadin level

## 2013-05-08 NOTE — Telephone Encounter (Signed)
Pharmacy notified.

## 2013-05-08 NOTE — Telephone Encounter (Signed)
Received fax from pharmacy stating there is a major drug interaction between Cipro and Warfarin. Please advise if ok to fill Rx or change. Thanks

## 2013-05-11 ENCOUNTER — Ambulatory Visit (INDEPENDENT_AMBULATORY_CARE_PROVIDER_SITE_OTHER): Payer: Medicare Other | Admitting: *Deleted

## 2013-05-11 DIAGNOSIS — Z7901 Long term (current) use of anticoagulants: Secondary | ICD-10-CM

## 2013-05-11 DIAGNOSIS — Z8679 Personal history of other diseases of the circulatory system: Secondary | ICD-10-CM

## 2013-05-11 DIAGNOSIS — I4891 Unspecified atrial fibrillation: Secondary | ICD-10-CM

## 2013-05-11 LAB — POCT INR: INR: 3.5

## 2013-05-14 ENCOUNTER — Telehealth: Payer: Self-pay | Admitting: Internal Medicine

## 2013-05-14 NOTE — Telephone Encounter (Signed)
Rec'd from [facility] forward 5 pages to Dr. Yetta Barre

## 2013-05-16 ENCOUNTER — Other Ambulatory Visit: Payer: Self-pay | Admitting: Physician Assistant

## 2013-05-18 ENCOUNTER — Ambulatory Visit (INDEPENDENT_AMBULATORY_CARE_PROVIDER_SITE_OTHER): Payer: Medicare Other | Admitting: *Deleted

## 2013-05-18 DIAGNOSIS — Z8679 Personal history of other diseases of the circulatory system: Secondary | ICD-10-CM

## 2013-05-18 DIAGNOSIS — I4891 Unspecified atrial fibrillation: Secondary | ICD-10-CM

## 2013-05-18 DIAGNOSIS — Z7901 Long term (current) use of anticoagulants: Secondary | ICD-10-CM

## 2013-05-18 LAB — POCT INR: INR: 3.6

## 2013-05-28 ENCOUNTER — Encounter (HOSPITAL_COMMUNITY): Payer: Self-pay | Admitting: *Deleted

## 2013-05-28 ENCOUNTER — Emergency Department (HOSPITAL_COMMUNITY)
Admission: EM | Admit: 2013-05-28 | Discharge: 2013-05-28 | Disposition: A | Discharge status: Home / Self Care | Payer: Medicare Other | Source: Home / Self Care | Attending: Family Medicine | Admitting: Family Medicine

## 2013-05-28 ENCOUNTER — Ambulatory Visit (INDEPENDENT_AMBULATORY_CARE_PROVIDER_SITE_OTHER): Payer: Medicare Other | Admitting: *Deleted

## 2013-05-28 DIAGNOSIS — IMO0001 Reserved for inherently not codable concepts without codable children: Secondary | ICD-10-CM

## 2013-05-28 DIAGNOSIS — I83009 Varicose veins of unspecified lower extremity with ulcer of unspecified site: Secondary | ICD-10-CM

## 2013-05-28 DIAGNOSIS — Z8679 Personal history of other diseases of the circulatory system: Secondary | ICD-10-CM

## 2013-05-28 DIAGNOSIS — I4891 Unspecified atrial fibrillation: Secondary | ICD-10-CM

## 2013-05-28 DIAGNOSIS — Z7901 Long term (current) use of anticoagulants: Secondary | ICD-10-CM

## 2013-05-28 LAB — POCT INR: INR: 3

## 2013-05-28 NOTE — ED Notes (Signed)
Pt  Has   Some    Swelling  Of  Both  Legs   l  Is  Worse  Than  r  He  Has  A  Break in  Skin  On the  r  Lower  Leg            With  Some  Redness  Present  To  The  Affected  Are                He  Takes  Coumadin  And  Actually  Had  His  Levels  Checked  Today

## 2013-05-28 NOTE — ED Provider Notes (Signed)
[name] G Meisenheimer Sr. is a 79 y.o. male who presents to Urgent Care today for left tibial ulcer. Patient developed a wound on his left shin starting 2 or 3 days ago. He is not sure how it happened. He was seen in the Coumadin clinic today and the nurse advised him to be evaluated for cellulitis. He denies any pain fevers or chills. He feels well otherwise. He is chronic lower extremity swelling due to stage IV kidney disease as well as ischemic cardiomyopathy due to a large myocardial infarction in 2007 status post CABG. He currently feels well however. He has been not very compliant with his diet, leg elevation, or compression stockings recently according to his daughter. His warfarin level was therapeutic this afternoon.   Past Medical History  Diagnosis Date  . Coronary artery disease     a.  s/p MI;  b. s/p CABG 2007;  c. cath 12/08: LM occluded, pRCA 80%, dRCA 50%, PLB 90%, S-PDA ok, S-Dx ok with occluded dist Dx; L-LAD ok, S-OM ok with 90% after graft insertion . . . . distal OM treated with DES  . Ischemic cardiomyopathy     echo 6/12 EF 35-40%, apical, dAnt, mid to dist inf-lat HK, mild AI, mod MR, mod BAE, PASP 44  . Chronic combined systolic and diastolic heart failure     admx 8/13;   . Hypertension   . Atrial fibrillation     a. on coumadin.  . CKD (chronic kidney disease), stage IV   . CVA (cerebral infarction)     left frontal  . Anemia     chronic  . Hypothyroidism   . Leg muscle spasm   . GERD (gastroesophageal reflux disease)   . Skin cancer    History  Substance Use Topics  . Smoking status: Never Smoker   . Smokeless tobacco: Never Used  . Alcohol Use: No   ROS as above Medications reviewed. No current facility-administered medications for this encounter.   Current Outpatient Prescriptions  Medication Sig Dispense Refill  . carvedilol (COREG) 3.125 MG tablet Take 3.125 mg by mouth 2 (two) times daily with a meal.      . ciprofloxacin (CIPRO) 500 MG tablet Take 1  tablet (500 mg total) by mouth 2 (two) times daily.  60 tablet  0  . fenofibrate 54 MG tablet TAKE 1 TABLET BY MOUTH ONCE DAILY  30 tablet  6  . furosemide (LASIX) 20 MG tablet Take 20 mg by mouth daily.      Marland Kitchen l-methylfolate-B6-B12 (METANX) 3-35-2 MG TABS TAKE ONE TABLET BY MOUTH TWICE DAILY  60 tablet  0  . levothyroxine (SYNTHROID, LEVOTHROID) 25 MCG tablet Take 1 tablet (25 mcg total) by mouth daily before breakfast.  90 tablet  1  . nitroGLYCERIN (NITROSTAT) 0.4 MG SL tablet Place 0.4 mg under the tongue every 5 (five) minutes as needed for chest pain.      . silodosin (RAPAFLO) 8 MG CAPS capsule Take 1 capsule (8 mg total) by mouth daily with breakfast.  90 capsule  3  . warfarin (COUMADIN) 2.5 MG tablet TAKE AS DIRECTED BY ANTICOAGULATION CLINIC  40 tablet  3    Exam:  BP 119/48  Pulse 69  Temp(Src) 97.6 F (36.4 C) (Oral)  Resp 16  SpO2 100% Gen: Well NAD LOWER EXTREMITY: Capillary refill sensation and pulses are intact distally. Both lower shin these have 2+ edema. The left extremity has a nickel-sized unstageable ulcer on the tibial aspect. There  is mild surrounding erythema which is nontender and nonindurated. The right lower extremity has a stage I superficial ulcer on the distal anterior tibia.    An Unna boot was applied to the left leg.   Results for orders placed in visit on 05/28/13 (from the past 24 hour(s))  POCT INR     Status: None   Collection Time    05/28/13  2:51 PM      Result Value Range   INR 3.0     No results found.  Assessment and Plan: 79 y.o. male with venous stasis ulcer of the left lower extremity. No evidence for cellulitis Unaboot applied. Followup with primary care provider Monday or Tuesday of next week. Discussed warning signs or symptoms. Please see discharge instructions. Patient expresses understanding.      Rodolph Bong, MD 05/28/13 [phone]

## 2013-05-29 ENCOUNTER — Telehealth: Payer: Self-pay | Admitting: Cardiology

## 2013-05-29 NOTE — Telephone Encounter (Signed)
New problem    Patient went to urgent care for cuts on legs. Diagnosis with due poor circulation, ulcer .  Has ace bandage on legs.   Weight  Today   154 lb. The family wanted the Dr . Antoine Poche to know his situation.

## 2013-05-29 NOTE — Telephone Encounter (Signed)
Will forward to MD for review and will call back if any orders

## 2013-05-30 ENCOUNTER — Telehealth: Payer: Self-pay | Admitting: Adult Health

## 2013-05-30 NOTE — Telephone Encounter (Signed)
Received call from daughter concerning father's wt gain fo 4 lbs with increased edema in the LE. She states that he has not been keeping legs elevated. He also has a laceration on the right lower leg which will be addressed by PCP on Monday.   I have advised that he take an additional 20 mg of Lasix today and tomorrow. If no change or worsening edema, come to ER. If better, will still need to be seen by Dr. Somerset Lions or Tereso Newcomer PA sometime next week.

## 2013-05-31 ENCOUNTER — Other Ambulatory Visit: Payer: Self-pay | Admitting: Cardiology

## 2013-06-01 ENCOUNTER — Encounter: Payer: Self-pay | Admitting: Internal Medicine

## 2013-06-01 ENCOUNTER — Ambulatory Visit (INDEPENDENT_AMBULATORY_CARE_PROVIDER_SITE_OTHER): Payer: Medicare Other | Admitting: Internal Medicine

## 2013-06-01 ENCOUNTER — Ambulatory Visit (INDEPENDENT_AMBULATORY_CARE_PROVIDER_SITE_OTHER)
Admission: RE | Admit: 2013-06-01 | Discharge: 2013-06-01 | Disposition: A | Discharge status: Home / Self Care | Payer: Medicare Other | Source: Ambulatory Visit | Attending: Internal Medicine | Admitting: Internal Medicine

## 2013-06-01 VITALS — BP 120/72 | HR 74 | Temp 96.6°F | Resp 16 | Ht 71.5 in | Wt 162.0 lb

## 2013-06-01 DIAGNOSIS — L97909 Non-pressure chronic ulcer of unspecified part of unspecified lower leg with unspecified severity: Secondary | ICD-10-CM

## 2013-06-01 DIAGNOSIS — M25511 Pain in right shoulder: Secondary | ICD-10-CM

## 2013-06-01 DIAGNOSIS — I83009 Varicose veins of unspecified lower extremity with ulcer of unspecified site: Secondary | ICD-10-CM | POA: Insufficient documentation

## 2013-06-01 DIAGNOSIS — IMO0001 Reserved for inherently not codable concepts without codable children: Secondary | ICD-10-CM

## 2013-06-01 DIAGNOSIS — M25519 Pain in unspecified shoulder: Secondary | ICD-10-CM

## 2013-06-01 MED ORDER — VASCULERA PO TABS: 1.0000 | ORAL_TABLET | Freq: Every day | ORAL | Status: DC

## 2013-06-01 NOTE — Progress Notes (Signed)
Subjective:    Patient ID: [name] Mans Sr., male    DOB: 12/11/1919, 79 y.o.   MRN: [id number]  HPI Comments: He was seen in an [facility] 5 days ago for a sore that developed spontaneously on his LLE - they applied an [employer] and sent him her for a recheck.  Shoulder Pain  The pain is present in the right shoulder. This is a recurrent problem. The current episode started 1 to 4 weeks ago. There has been no history of extremity trauma. The problem occurs intermittently. The problem has been unchanged. The quality of the pain is described as aching. The pain is at a severity of 2/10. The pain is mild. Associated symptoms include a limited range of motion. Pertinent negatives include no fever, inability to bear weight, itching, joint locking, joint swelling, numbness, stiffness or tingling. He has tried nothing for the symptoms.      Review of Systems  Constitutional: Negative.  Negative for fever, chills, diaphoresis, activity change, appetite change, fatigue and unexpected weight change.  HENT: Negative.   Eyes: Negative.   Respiratory: Negative.  Negative for cough, chest tightness, shortness of breath, wheezing and stridor.   Cardiovascular: Positive for leg swelling. Negative for chest pain and palpitations.  Gastrointestinal: Negative.  Negative for nausea, vomiting, abdominal pain, diarrhea and constipation.  Endocrine: Negative.   Genitourinary: Negative.   Musculoskeletal: Positive for arthralgias. Negative for myalgias, back pain, joint swelling, gait problem and stiffness.  Skin: Positive for wound. Negative for color change, itching, pallor and rash.  Allergic/Immunologic: Negative.   Neurological: Negative.  Negative for dizziness, tingling and numbness.  Hematological: Negative.  Negative for adenopathy. Does not bruise/bleed easily.  Psychiatric/Behavioral: Negative.        Objective:   Physical Exam  Vitals reviewed. Constitutional: He is oriented to person, place, and  time. He appears well-developed and well-nourished. No distress.  HENT:  Head: Normocephalic and atraumatic.  Mouth/Throat: Oropharynx is clear and moist. No oropharyngeal exudate.  Eyes: Conjunctivae are normal. Right eye exhibits no discharge. Left eye exhibits no discharge. No scleral icterus.  Neck: Normal range of motion. Neck supple. No JVD present. No tracheal deviation present. No thyromegaly present.  Cardiovascular: Normal rate, normal heart sounds and intact distal pulses.  An irregularly irregular rhythm present. Exam reveals no gallop and no friction rub.   No murmur heard. Pulses:      Carotid pulses are 1+ on the right side, and 1+ on the left side.      Radial pulses are 1+ on the right side, and 1+ on the left side.       Femoral pulses are 1+ on the right side, and 1+ on the left side.      Popliteal pulses are 1+ on the right side, and 1+ on the left side.       Dorsalis pedis pulses are 1+ on the right side, and 1+ on the left side.       Posterior tibial pulses are 1+ on the right side, and 1+ on the left side.  Pulmonary/Chest: Effort normal and breath sounds normal. No stridor. No respiratory distress. He has no wheezes. He has no rales. He exhibits no tenderness.  Abdominal: Soft. Bowel sounds are normal. He exhibits no distension and no mass. There is no tenderness. There is no rebound and no guarding.  Musculoskeletal: He exhibits edema (2++ edema in BLE). He exhibits no tenderness.       Right shoulder: He  exhibits decreased range of motion. He exhibits no tenderness, no bony tenderness, no swelling, no effusion, no crepitus, no deformity, no laceration, no pain, no spasm, normal pulse and normal strength.       Legs: Lymphadenopathy:    He has no cervical adenopathy.  Neurological: He is oriented to person, place, and time.  Skin: Skin is warm and dry. No rash noted. He is not diaphoretic. No erythema. No pallor.  Psychiatric: He has a normal mood and affect. His  behavior is normal. Judgment and thought content normal.     Lab Results  Component Value Date   WBC 6.2 05/06/2013   HGB 10.1* 05/06/2013   HCT 30.0* 05/06/2013   PLT 110.0 Repeated and verified X2.* 05/06/2013   GLUCOSE 84 05/06/2013   CHOL 104 04/24/2012   TRIG 54 04/24/2012   HDL 31* 04/24/2012   LDLCALC 62 04/24/2012   ALT 19 05/06/2013   AST 22 05/06/2013   NA 139 05/06/2013   K 4.3 05/06/2013   CL 108 05/06/2013   CREATININE 2.1* 05/06/2013   BUN 43* 05/06/2013   CO2 25 05/06/2013   TSH 2.96 05/06/2013   INR 3.0 05/28/2013   HGBA1C 5.9 01/12/2013      Assessment & Plan:

## 2013-06-01 NOTE — Patient Instructions (Signed)
Venous Stasis and Chronic Venous Insufficiency  As people age, the veins located in their legs may weaken and stretch. When veins weaken and lose the ability to pump blood effectively, the condition is called chronic venous insufficiency (CVI) or venous stasis. Almost all veins return blood back to the heart. This happens by:   The force of the heart pumping fresh blood pushes blood back to the heart.   Blood flowing to the heart from the force of gravity.  In the deep veins of the legs, blood has to fight gravity and flow upstream back to the heart. Here, the leg muscles contract to pump blood back toward the heart. Vein walls are elastic, and many veins have small valves that only allow blood to flow in one direction. When leg muscles contract, they push inward against the elastic vein walls. This squeezes blood upward, opens the valves, and moves blood toward the heart. When leg muscles relax, the vein wall also relaxes and the valves inside the vein close to prevent blood from flowing backward. This method of pumping blood out of the legs is called the venous pump.  CAUSES   The venous pump works best while walking and leg muscles are contracting. But when a person sits or stands, blood pressure in leg veins can build. Deep veins are usually able to withstand short periods of inactivity, but long periods of inactivity (and increased pressure) can stretch, weaken, and damage vein walls. High blood pressure can also stretch and damage vein walls. The veins may no longer be able to pump blood back to the heart. Venous hypertension (high blood pressure inside veins) that lasts over time is a primary cause of CVI. CVI can also be caused by:    Deep vein thrombosis, a condition where a thrombus (blood clot) blocks blood flow in a vein.   Phlebitis, an inflammation of a superficial vein that causes a blood clot to form.  Other risk factors for CVI may include:    Heredity.   Obesity.   Pregnancy.    Sedentary lifestyle.   Smoking.   Jobs requiring long periods of standing or sitting in one place.   Age and gender:   Women in their 40's and 50's and men in their 70's are more prone to developing CVI.  SYMPTOMS   Symptoms of CVI may include:    Varicose veins.   Ulceration or skin breakdown.   Lipodermatosclerosis, a condition that affects the skin just above the ankle, usually on the inside surface. Over time the skin becomes brown, smooth, tight and often painful. Those with this condition have a high risk of developing skin ulcers.   Reddened or discolored skin on the leg.   Swelling.  DIAGNOSIS   Your caregiver can diagnose CVI after performing a careful medical history and physical examination. To confirm the diagnosis, the following tests may also be ordered:    Duplex ultrasound.   Plethysmography (tests blood flow).   Venograms (x-ray using a special dye).  TREATMENT  The goals of treatment for CVI are to restore a person to an active life and to minimize pain or disability. Typically, CVI does not pose a serious threat to life or limb, and with proper treatment most people with this condition can continue to lead active lives. In most cases, mild CVI can be treated on an outpatient basis with simple procedures. Treatment methods include:    Elastic compression socks.   Sclerotherapy, a procedure involving an injection of   a material that "dissolves" the damaged veins. Other veins in the network of blood vessels take over the function of the damaged veins.   Vein stripping (an older procedure less commonly used).   Laser Ablation surgery.   Valve repair.  HOME CARE INSTRUCTIONS    Elastic compression socks must be worn every day. They can help with symptoms and lower the chances of the problem getting worse, but they do not cure the problem.   Only take over-the-counter or prescription medicines for pain, discomfort, or fever as directed by your caregiver.    Your caregiver will review your other medications with you.  SEEK MEDICAL CARE IF:    You are confused about how to take your medications.   There is redness, swelling, or increasing pain in the affected area.   There is a red streak or line that extends up or down from the affected area.   There is a breakdown or loss of skin in the affected area, even if the breakdown is small.   You develop an unexplained oral temperature above 102 F (38.9 C).   There is an injury to the affected area.  SEEK IMMEDIATE MEDICAL CARE IF:    There is an injury and open wound to the affected area.   Pain is not adequately relieved with pain medication prescribed or becomes severe.   An oral temperature above 102 F (38.9 C) develops.   The foot/ankle below the affected area becomes suddenly numb or the area feels weak and hard to move.  MAKE SURE YOU:    Understand these instructions.   Will watch your condition.   Will get help right away if you are not doing well or get worse.  Document Released: 12/17/2006 Document Revised: 11/05/2011 Document Reviewed: 02/24/2007  [employer] Patient Information 2014 [employer]

## 2013-06-02 ENCOUNTER — Encounter: Payer: Self-pay | Admitting: Internal Medicine

## 2013-06-02 NOTE — Assessment & Plan Note (Signed)
The plain film is normal Will refer to sports med for further evaluation

## 2013-06-02 NOTE — Assessment & Plan Note (Signed)
He will start vasculera An new UNNA boot was placed on the LLE He will RTC in 1 week for further evaluation

## 2013-06-04 ENCOUNTER — Encounter: Payer: Self-pay | Admitting: Physician Assistant

## 2013-06-04 ENCOUNTER — Other Ambulatory Visit: Payer: Medicare Other

## 2013-06-04 ENCOUNTER — Ambulatory Visit (INDEPENDENT_AMBULATORY_CARE_PROVIDER_SITE_OTHER): Payer: Medicare Other | Admitting: Physician Assistant

## 2013-06-04 VITALS — BP 108/66 | HR 81 | Ht 71.0 in | Wt 162.0 lb

## 2013-06-04 DIAGNOSIS — N184 Chronic kidney disease, stage 4 (severe): Secondary | ICD-10-CM

## 2013-06-04 DIAGNOSIS — R609 Edema, unspecified: Secondary | ICD-10-CM

## 2013-06-04 DIAGNOSIS — I10 Essential (primary) hypertension: Secondary | ICD-10-CM

## 2013-06-04 DIAGNOSIS — I5042 Chronic combined systolic (congestive) and diastolic (congestive) heart failure: Secondary | ICD-10-CM

## 2013-06-04 DIAGNOSIS — I4891 Unspecified atrial fibrillation: Secondary | ICD-10-CM

## 2013-06-04 MED ORDER — FUROSEMIDE 20 MG PO TABS: ORAL_TABLET | ORAL | Status: DC

## 2013-06-04 NOTE — Progress Notes (Signed)
HPI:  This is a very pleasant 79 year old male patient of Dr. Antoine Poche who has a history of coronary artery disease status post CABG in 2007 with a stent to the OM in 2008. He also has combined systolic and diastolic CHF as well as chronic kidney disease and permanent atrial fibrillation on Coumadin therapy. He is been followed closely by Tereso Newcomer for her fluid overload over the past several months. Been a lot of medication issues and compliance as he lives alone with his 4 year old wife. His daughter has not taken control of his medicines but unfortunately the take out every day and have no control over his sodium intake. He also stopped weighing himself recently and is up a pound since his last office visit. He was also found to have a stasis ulcer on his left lower leg that is being treated at the wound clinic.  The patient denies any dyspnea, dyspnea on exertion, chest pain, orthopnea, dizziness, or presyncope. He still is quite active for 79 years old but his daughter is wearing down trying to take care of both parents.  Allergies:  -- Ace Inhibitors    --  Cannot remember the reaction  Current Outpatient Prescriptions on File Prior to Visit: carvedilol (COREG) 3.125 MG tablet, TAKE 1 TABLET BY MOUTH TWICE DAILY, Disp: 60 tablet, Rfl: 6 ciprofloxacin (CIPRO) 500 MG tablet, Take 1 tablet (500 mg total) by mouth 2 (two) times daily., Disp: 60 tablet, Rfl: 0 Dietary Management Product (VASCULERA) TABS, Take 1 capsule by mouth daily., Disp: 90 tablet, Rfl: 3 fenofibrate 54 MG tablet, TAKE 1 TABLET BY MOUTH ONCE DAILY, Disp: 30 tablet, Rfl: 6 l-methylfolate-B6-B12 (METANX) 3-35-2 MG TABS, TAKE ONE TABLET BY MOUTH TWICE DAILY, Disp: 60 tablet, Rfl: 0 levothyroxine (SYNTHROID, LEVOTHROID) 25 MCG tablet, Take 1 tablet (25 mcg total) by mouth daily before breakfast., Disp: 90 tablet, Rfl: 1 nitroGLYCERIN (NITROSTAT) 0.4 MG SL tablet, Place 0.4 mg under the tongue every 5 (five) minutes as needed for  chest pain., Disp: , Rfl:  silodosin (RAPAFLO) 8 MG CAPS capsule, Take 1 capsule (8 mg total) by mouth daily with breakfast., Disp: 90 capsule, Rfl: 3 warfarin (COUMADIN) 2.5 MG tablet, TAKE AS DIRECTED BY ANTICOAGULATION CLINIC, Disp: 40 tablet, Rfl: 3  No current facility-administered medications on file prior to visit.   Past Medical History:   Coronary artery disease                                        Comment:a.  s/p MI;  b. s/p CABG 2007;  c. cath 12/08:               LM occluded, pRCA 80%, dRCA 50%, PLB 90%, S-PDA              ok, S-Dx ok with occluded dist Dx; L-LAD ok,               S-OM ok with 90% after graft insertion . . . Marland Kitchen               distal OM treated with DES   Ischemic cardiomyopathy                                        Comment:echo 6/12 EF 35-40%, apical, dAnt, mid to dist  inf-lat HK, mild AI, mod MR, mod BAE, PASP 44   Chronic combined systolic and diastolic heart *                Comment:admx 8/13;    Hypertension                                                 Atrial fibrillation                                            Comment:a. on coumadin.   CKD (chronic kidney disease), stage IV                       CVA (cerebral infarction)                                      Comment:left frontal   Anemia                                                         Comment:chronic   Hypothyroidism                                               Leg muscle spasm                                             GERD (gastroesophageal reflux disease)                       Skin cancer                                                 Past Surgical History:   CORONARY ARTERY BYPASS GRAFT                     2007         CORONARY ANGIOPLASTY WITH STENT PLACEMENT                    Review of patient's family history indicates:   Cancer                         Mother                   Stroke                         Father                   Social History  Marital Status: Married             Spouse Name:                      Years of Education:                 Number of children:             Occupational History   None on file  Social History Main Topics   Smoking Status: Never Smoker                     Smokeless Status: Never Used                       Alcohol Use: No             Drug Use: No             Sexual Activity: Not Currently      Other Topics            Concern   None on file  Social History Narrative   None on file    ROS: See history of present illness otherwise negative   PHYSICAL EXAM: Well-nournished, in no acute distress. Neck: Slight increase JVD, no HJR, Bruit, or thyroid enlargement  Lungs: No tachypnea, clear without wheezing, rales, or rhonchi  Cardiovascular: Irregular irregular, PMI not displaced, 2/6 systolic murmur at the left sternal border, no gallops, bruit, thrill, or heave.  Abdomen: BS normal. Soft without organomegaly, masses, lesions or tenderness.  Extremities: +2-3 edema bilaterally, brawny changes bilaterally, left lower leg is wrapped for a stasis ulcer. Decreased distal pulses bilateral  SKin: Stasis ulcer on the left leg that is wrapped  Musculoskeletal: No deformities  Neuro: no focal signs  BP 108/66  Pulse 81  Ht 5\' 11"  (1.803 m)  Wt 162 lb (73.483 kg)  BMI 22.6 kg/m2   EKG: Atrial fibrillation at 81 beats per minute with right bundle branch block

## 2013-06-04 NOTE — Assessment & Plan Note (Signed)
Patient has gained 8 pounds since his last office visit and has significant lower extremity edema. Will increase his Lasix to 40 mg daily for a week. He will have a be met next week. He is to weigh himself daily and call his there is no improvement. I had along discussion with his daughter concerning a 2 g sodium diet. She is finding it very difficult as she and her husband both work full time and have to bring take out to them every night for dinner.

## 2013-06-04 NOTE — Assessment & Plan Note (Signed)
Controlled on Coreg and Coumadin

## 2013-06-04 NOTE — Assessment & Plan Note (Signed)
Stable

## 2013-06-04 NOTE — Patient Instructions (Addendum)
Your physician has recommended you make the following change in your medication:   1. Increase your lasix to 20 mg 2 tab daily for 1 week then decrease to  1 tab daily  Your physician recommends that you return for lab work in: 1 week: BMET on 10/16 with coumadin appt  Your physician wants you to follow-up with Scott 06/17/13     2 Gram Low Sodium Diet A 2 gram sodium diet restricts the amount of sodium in the diet to no more than 2 g or 2000 mg daily. Limiting the amount of sodium is often used to help lower blood pressure. It is important if you have heart, liver, or kidney problems. Many foods contain sodium for flavor and sometimes as a preservative. When the amount of sodium in a diet needs to be low, it is important to know what to look for when choosing foods and drinks. The following includes some information and guidelines to help make it easier for you to adapt to a low sodium diet. QUICK TIPS  Do not add salt to food.  Avoid convenience items and fast food.  Choose unsalted snack foods.  Buy lower sodium products, often labeled as "lower sodium" or "no salt added."  Check food labels to learn how much sodium is in 1 serving.  When eating at a restaurant, ask that your food be prepared with less salt or none, if possible. READING FOOD LABELS FOR SODIUM INFORMATION The nutrition facts label is a good place to find how much sodium is in foods. Look for products with no more than 500 to 600 mg of sodium per meal and no more than 150 mg per serving. Remember that 2 g = 2000 mg. The food label may also list foods as:  Sodium-free: Less than 5 mg in a serving.  Very low sodium: 35 mg or less in a serving.  Low-sodium: 140 mg or less in a serving.  Light in sodium: 50% less sodium in a serving. For example, if a food that usually has 300 mg of sodium is changed to become light in sodium, it will have 150 mg of sodium.  Reduced sodium: 25% less sodium in a serving. For  example, if a food that usually has 400 mg of sodium is changed to reduced sodium, it will have 300 mg of sodium. CHOOSING FOODS Grains  Avoid: Salted crackers and snack items. Some cereals, including instant hot cereals. Bread stuffing and biscuit mixes. Seasoned rice or pasta mixes.  Choose: Unsalted snack items. Low-sodium cereals, oats, puffed wheat and rice, shredded wheat. English muffins and bread. Pasta. Meats  Avoid: Salted, canned, smoked, spiced, pickled meats, including fish and poultry. Bacon, ham, sausage, cold cuts, hot dogs, anchovies.  Choose: Low-sodium canned tuna and salmon. Fresh or frozen meat, poultry, and fish. Dairy  Avoid: Processed cheese and spreads. Cottage cheese. Buttermilk and condensed milk. Regular cheese.  Choose: Milk. Low-sodium cottage cheese. Yogurt. Sour cream. Low-sodium cheese. Fruits and Vegetables  Avoid: Regular canned vegetables. Regular canned tomato sauce and paste. Frozen vegetables in sauces. Olives. Rosita Fire. Relishes. Sauerkraut.  Choose: Low-sodium canned vegetables. Low-sodium tomato sauce and paste. Frozen or fresh vegetables. Fresh and frozen fruit. Condiments  Avoid: Canned and packaged gravies. Worcestershire sauce. Tartar sauce. Barbecue sauce. Soy sauce. Steak sauce. Ketchup. Onion, garlic, and table salt. Meat flavorings and tenderizers.  Choose: Fresh and dried herbs and spices. Low-sodium varieties of mustard and ketchup. Lemon juice. Tabasco sauce. Horseradish. SAMPLE 2 GRAM SODIUM MEAL  PLAN Breakfast / Sodium (mg)  1 cup low-fat milk / 143 mg  2 slices whole-wheat toast / 270 mg  1 tbs heart-healthy margarine / 153 mg  1 hard-boiled egg / 139 mg  1 small orange / 0 mg Lunch / Sodium (mg)  1 cup raw carrots / 76 mg   cup hummus / 298 mg  1 cup low-fat milk / 143 mg   cup red grapes / 2 mg  1 whole-wheat pita bread / 356 mg Dinner / Sodium (mg)  1 cup whole-wheat pasta / 2 mg  1 cup low-sodium tomato  sauce / 73 mg  3 oz lean ground beef / 57 mg  1 small side salad (1 cup raw spinach leaves,  cup cucumber,  cup yellow bell pepper) with 1 tsp olive oil and 1 tsp red wine vinegar / 25 mg Snack / Sodium (mg)  1 container low-fat vanilla yogurt / 107 mg  3 graham cracker squares / 127 mg Nutrient Analysis  Calories: 2033  Protein: 77 g  Carbohydrate: 282 g  Fat: 72 g  Sodium: 1971 mg Document Released: 08/13/2005 Document Revised: 11/05/2011 Document Reviewed: 11/14/2009 Exit[employer] Patient Information 2014 [place], [place].

## 2013-06-04 NOTE — Assessment & Plan Note (Signed)
Recheck next week

## 2013-06-07 ENCOUNTER — Other Ambulatory Visit: Payer: Self-pay | Admitting: Neurology

## 2013-06-08 ENCOUNTER — Telehealth: Payer: Self-pay | Admitting: Cardiology

## 2013-06-08 NOTE — Telephone Encounter (Signed)
Left message for [name] that I received the message and that Dr Antoine Poche doesn't want to make any changes at this time.   Requested she call back if she has further needs.

## 2013-06-08 NOTE — Telephone Encounter (Signed)
New Problem  Pt's DTR states that she was advised to call with an update.. There is a slight improvement of swelling in his leg (swelling is not completely gone) increase in urination and wt is down to 155 lbs. Request a call back to discuss update.

## 2013-06-08 NOTE — Telephone Encounter (Signed)
[facility] reviewed - no new orders given

## 2013-06-11 ENCOUNTER — Ambulatory Visit (INDEPENDENT_AMBULATORY_CARE_PROVIDER_SITE_OTHER): Payer: Medicare Other | Admitting: Pharmacist

## 2013-06-11 ENCOUNTER — Other Ambulatory Visit (INDEPENDENT_AMBULATORY_CARE_PROVIDER_SITE_OTHER): Payer: Medicare Other

## 2013-06-11 DIAGNOSIS — Z8679 Personal history of other diseases of the circulatory system: Secondary | ICD-10-CM

## 2013-06-11 DIAGNOSIS — R609 Edema, unspecified: Secondary | ICD-10-CM

## 2013-06-11 DIAGNOSIS — Z7901 Long term (current) use of anticoagulants: Secondary | ICD-10-CM

## 2013-06-11 DIAGNOSIS — I4891 Unspecified atrial fibrillation: Secondary | ICD-10-CM

## 2013-06-12 ENCOUNTER — Encounter: Payer: Self-pay | Admitting: Internal Medicine

## 2013-06-12 ENCOUNTER — Ambulatory Visit (INDEPENDENT_AMBULATORY_CARE_PROVIDER_SITE_OTHER): Payer: Medicare Other | Admitting: Internal Medicine

## 2013-06-12 VITALS — BP 122/62 | HR 76 | Temp 96.6°F | Resp 16 | Wt 162.5 lb

## 2013-06-12 DIAGNOSIS — IMO0001 Reserved for inherently not codable concepts without codable children: Secondary | ICD-10-CM

## 2013-06-12 DIAGNOSIS — I10 Essential (primary) hypertension: Secondary | ICD-10-CM

## 2013-06-12 DIAGNOSIS — I83009 Varicose veins of unspecified lower extremity with ulcer of unspecified site: Secondary | ICD-10-CM

## 2013-06-12 DIAGNOSIS — I5042 Chronic combined systolic (congestive) and diastolic (congestive) heart failure: Secondary | ICD-10-CM

## 2013-06-12 DIAGNOSIS — I4891 Unspecified atrial fibrillation: Secondary | ICD-10-CM

## 2013-06-12 LAB — BASIC METABOLIC PANEL
BUN: 51 mg/dL — ABNORMAL HIGH (ref 6–23)
CO2: 27 mEq/L (ref 19–32)
Calcium: 8.8 mg/dL (ref 8.4–10.5)
Chloride: 107 mEq/L (ref 96–112)
Creatinine, Ser: 2.3 mg/dL — ABNORMAL HIGH (ref 0.4–1.5)
Glucose, Bld: 118 mg/dL — ABNORMAL HIGH (ref 70–99)
Potassium: 3.8 mEq/L (ref 3.5–5.1)

## 2013-06-12 NOTE — Progress Notes (Signed)
Subjective:    Patient ID: [name] Mans Sr., male    DOB: 10-05-1919, 79 y.o.   MRN: [id number]  Hypertension This is a chronic problem. The current episode started more than 1 year ago. The problem is unchanged. The problem is controlled. Associated symptoms include peripheral edema. Pertinent negatives include no anxiety, blurred vision, chest pain, headaches, malaise/fatigue, neck pain, orthopnea, palpitations, PND, shortness of breath or sweats. Past treatments include beta blockers and diuretics. Hypertensive end-organ damage includes kidney disease and heart failure. Identifiable causes of hypertension include chronic renal disease.      Review of Systems  Constitutional: Positive for unexpected weight change (some weight gain). Negative for fever, chills, malaise/fatigue, diaphoresis, appetite change and fatigue.  HENT: Negative.   Eyes: Negative.  Negative for blurred vision.  Respiratory: Negative.  Negative for cough, choking, chest tightness, shortness of breath, wheezing and stridor.   Cardiovascular: Negative.  Negative for chest pain, palpitations, orthopnea, leg swelling and PND.  Gastrointestinal: Negative.  Negative for nausea, vomiting, abdominal pain, diarrhea, constipation and blood in stool.  Endocrine: Negative.   Genitourinary: Negative.  Negative for dysuria, frequency and hematuria.  Musculoskeletal: Negative.  Negative for arthralgias, back pain, gait problem, myalgias, neck pain and neck stiffness.  Skin: Negative.  Negative for color change, pallor, rash and wound.  Allergic/Immunologic: Negative.   Neurological: Negative.  Negative for dizziness, tremors, weakness, light-headedness, numbness and headaches.  Hematological: Negative.  Negative for adenopathy. Does not bruise/bleed easily.  Psychiatric/Behavioral: Negative.        Objective:   Physical Exam  Vitals reviewed. Constitutional: He is oriented to person, place, and time. He appears well-developed  and well-nourished. No distress.  HENT:  Head: Normocephalic and atraumatic.  Mouth/Throat: Oropharynx is clear and moist. No oropharyngeal exudate.  Eyes: Conjunctivae are normal. Right eye exhibits no discharge. Left eye exhibits no discharge. No scleral icterus.  Neck: Normal range of motion. Neck supple. No JVD present. No tracheal deviation present. No thyromegaly present.  Cardiovascular: Normal rate and intact distal pulses.  An irregularly irregular rhythm present. Exam reveals no gallop, no S3, no S4 and no friction rub.   Murmur heard.  Decrescendo systolic murmur is present with a grade of 1/6   No diastolic murmur is present  Pulmonary/Chest: Effort normal and breath sounds normal. No stridor. No respiratory distress. He has no wheezes. He has no rales. He exhibits no tenderness.  Abdominal: Soft. Bowel sounds are normal. He exhibits no distension and no mass. There is no tenderness. There is no rebound and no guarding.  Musculoskeletal: Normal range of motion. He exhibits edema (2+ pitting edema in BLE). He exhibits no tenderness.       Legs: Lymphadenopathy:    He has no cervical adenopathy.  Neurological: He is oriented to person, place, and time.  Skin: Skin is warm and dry. No rash noted. He is not diaphoretic. No erythema. No pallor.  Psychiatric: He has a normal mood and affect. His behavior is normal. Judgment and thought content normal.     Lab Results  Component Value Date   WBC 6.2 05/06/2013   HGB 10.1* 05/06/2013   HCT 30.0* 05/06/2013   PLT 110.0 Repeated and verified X2.* 05/06/2013   GLUCOSE 84 05/06/2013   CHOL 104 04/24/2012   TRIG 54 04/24/2012   HDL 31* 04/24/2012   LDLCALC 62 04/24/2012   ALT 19 05/06/2013   AST 22 05/06/2013   NA 139 05/06/2013   K 4.3 05/06/2013  CL 108 05/06/2013   CREATININE 2.1* 05/06/2013   BUN 43* 05/06/2013   CO2 25 05/06/2013   TSH 2.96 05/06/2013   INR 2.9 06/11/2013   HGBA1C 5.9 01/12/2013       Assessment & Plan:

## 2013-06-12 NOTE — Assessment & Plan Note (Signed)
His BP is well controlled 

## 2013-06-12 NOTE — Assessment & Plan Note (Signed)
The [facility] boot was removed The ulcer has healed I have asked him to continue taking vasculera

## 2013-06-12 NOTE — Patient Instructions (Signed)
Atrial Fibrillation Your caregiver has diagnosed you with atrial fibrillation (AFib). The heart normally beats very regularly; AFib is a type of irregular heartbeat. The heart rate may be faster or slower than normal. This can prevent your heart from pumping as well as it should. AFib can be constant (chronic) or intermittent (paroxysmal). CAUSES  Atrial fibrillation may be caused by:  Heart disease, including heart attack, coronary artery disease, heart failure, diseases of the heart valves, and others.  Blood clot in the lungs (pulmonary embolism).  Pneumonia or other infections.  Chronic lung disease.  Thyroid disease.  Toxins. These include alcohol, some medications (such as decongestant medications or diet pills), and caffeine. In some people, no cause for AFib can be found. This is referred to as Lone Atrial Fibrillation. SYMPTOMS   Palpitations or a fluttering in your chest.  A vague sense of chest discomfort.  Shortness of breath.  Sudden onset of lightheadedness or weakness. Sometimes, the first sign of AFib can be a complication of the condition. This could be a stroke or heart failure. DIAGNOSIS  Your description of your condition may make your caregiver suspicious of atrial fibrillation. Your caregiver will examine your pulse to determine if fibrillation is present. An EKG (electrocardiogram) will confirm the diagnosis. Further testing may help determine what caused you to have atrial fibrillation. This may include chest x-ray, echocardiogram, blood tests, or CT scans. PREVENTION  If you have previously had atrial fibrillation, your caregiver may advise you to avoid substances known to cause the condition (such as stimulant medications, and possibly caffeine or alcohol). You may be advised to use medications to prevent recurrence. Proper treatment of any underlying condition is important to help prevent recurrence. PROGNOSIS  Atrial fibrillation does tend to become a  chronic condition over time. It can cause significant complications (see below). Atrial fibrillation is not usually immediately life-threatening, but it can shorten your life expectancy. This seems to be worse in women. If you have lone atrial fibrillation and are under 60 years old, the risk of complications is very low, and life expectancy is not shortened. RISKS AND COMPLICATIONS  Complications of atrial fibrillation can include stroke, chest pain, and heart failure. Your caregiver will recommend treatments for the atrial fibrillation, as well as for any underlying conditions, to help minimize risk of complications. TREATMENT  Treatment for AFib is divided into several categories:  Treatment of any underlying condition.  Converting you out of AFib into a regular (sinus) rhythm.  Controlling rapid heart rate.  Prevention of blood clots and stroke. Medications and procedures are available to convert your atrial fibrillation to sinus rhythm. However, recent studies have shown that this may not offer you any advantage, and cardiac experts are continuing research and debate on this topic. More important is controlling your rapid heartbeat. The rapid heartbeat causes more symptoms, and places strain on your heart. Your caregiver will advise you on the use of medications that can control your heart rate. Atrial fibrillation is a strong stroke risk. You can lessen this risk by taking blood thinning medications such as Coumadin (warfarin), or sometimes aspirin. These medications need close monitoring by your caregiver. Over-medication can cause bleeding. Too little medication may not protect against stroke. HOME CARE INSTRUCTIONS   If your caregiver prescribed medicine to make your heartbeat more normally, take as directed.  If blood thinners were prescribed by your caregiver, take EXACTLY as directed.  Perform blood tests EXACTLY as directed.  Quit smoking. Smoking increases your cardiac and   lung  (pulmonary) risks.  DO NOT drink alcohol.  DO NOT drink caffeinated drinks (e.g. coffee, soda, chocolate, and leaf teas). You may drink decaffeinated coffee, soda or tea.  If you are overweight, you should choose a reduced calorie diet to lose weight. Please see a registered dietitian if you need more information about healthy weight loss. DO NOT USE DIET PILLS as they may aggravate heart problems.  If you have other heart problems that are causing AFib, you may need to eat a low salt, fat, and cholesterol diet. Your caregiver will tell you if this is necessary.  Exercise every day to improve your physical fitness. Stay active unless advised otherwise.  If your caregiver has given you a follow-up appointment, it is very important to keep that appointment. Not keeping the appointment could result in heart failure or stroke. If there is any problem keeping the appointment, you must call back to this facility for assistance. SEEK MEDICAL CARE IF:  You notice a change in the rate, rhythm or strength of your heartbeat.  You develop an infection or any other change in your overall health status. SEEK IMMEDIATE MEDICAL CARE IF:   You develop chest pain, abdominal pain, sweating, weakness or feel sick to your stomach (nausea).  You develop shortness of breath.  You develop swollen feet and ankles.  You develop dizziness, numbness, or weakness of your face or limbs, or any change in vision or speech. MAKE SURE YOU:   Understand these instructions.  Will watch your condition.  Will get help right away if you are not doing well or get worse. Document Released: 08/13/2005 Document Revised: 11/05/2011 Document Reviewed: 03/22/2010 [employer] Patient Information 2014 [employer].  

## 2013-06-12 NOTE — Assessment & Plan Note (Signed)
He has had some worsening edema in now wt gain His daughter will increase the lasix for the next week

## 2013-06-12 NOTE — Assessment & Plan Note (Signed)
He has good rate control Edema is being addressed

## 2013-06-17 ENCOUNTER — Encounter: Payer: Self-pay | Admitting: Physician Assistant

## 2013-06-17 ENCOUNTER — Ambulatory Visit (INDEPENDENT_AMBULATORY_CARE_PROVIDER_SITE_OTHER): Payer: Medicare Other | Admitting: Physician Assistant

## 2013-06-17 VITALS — BP 130/70 | HR 82 | Ht 71.0 in | Wt 166.0 lb

## 2013-06-17 DIAGNOSIS — I4891 Unspecified atrial fibrillation: Secondary | ICD-10-CM

## 2013-06-17 DIAGNOSIS — N184 Chronic kidney disease, stage 4 (severe): Secondary | ICD-10-CM

## 2013-06-17 DIAGNOSIS — I2510 Atherosclerotic heart disease of native coronary artery without angina pectoris: Secondary | ICD-10-CM

## 2013-06-17 DIAGNOSIS — I5043 Acute on chronic combined systolic (congestive) and diastolic (congestive) heart failure: Secondary | ICD-10-CM

## 2013-06-17 MED ORDER — FUROSEMIDE 20 MG PO TABS: ORAL_TABLET | ORAL | Status: DC

## 2013-06-17 NOTE — Patient Instructions (Signed)
LAB WORK (BMET) 06/26/13  ON 10/23 AND 10/24 TAKE LASIX 60 MG DAILY; THEN STARTING 06/20/13 YOU WILL DECREASE LASIX TO 40 MG DAILY; REFILL SENT IN  MAKE SURE TO WEIGH DAILY; KEEP RECORD OF WEIGHTS AND BRING TO LAB APPT TO GIVE TO SCOTT WEAVER, PAC   KEEP YOUR APPT WITH DR. HOCHREIN IN 07/20/13  2 Gram Low Sodium Diet A 2 gram sodium diet restricts the amount of sodium in the diet to no more than 2 g or 2000 mg daily. Limiting the amount of sodium is often used to help lower blood pressure. It is important if you have heart, liver, or kidney problems. Many foods contain sodium for flavor and sometimes as a preservative. When the amount of sodium in a diet needs to be low, it is important to know what to look for when choosing foods and drinks. The following includes some information and guidelines to help make it easier for you to adapt to a low sodium diet. QUICK TIPS  Do not add salt to food.  Avoid convenience items and fast food.  Choose unsalted snack foods.  Buy lower sodium products, often labeled as "lower sodium" or "no salt added."  Check food labels to learn how much sodium is in 1 serving.  When eating at a restaurant, ask that your food be prepared with less salt or none, if possible. READING FOOD LABELS FOR SODIUM INFORMATION The nutrition facts label is a good place to find how much sodium is in foods. Look for products with no more than 500 to 600 mg of sodium per meal and no more than 150 mg per serving. Remember that 2 g = 2000 mg. The food label may also list foods as:  Sodium-free: Less than 5 mg in a serving.  Very low sodium: 35 mg or less in a serving.  Low-sodium: 140 mg or less in a serving.  Light in sodium: 50% less sodium in a serving. For example, if a food that usually has 300 mg of sodium is changed to become light in sodium, it will have 150 mg of sodium.  Reduced sodium: 25% less sodium in a serving. For example, if a food that usually has 400 mg of  sodium is changed to reduced sodium, it will have 300 mg of sodium. CHOOSING FOODS Grains  Avoid: Salted crackers and snack items. Some cereals, including instant hot cereals. Bread stuffing and biscuit mixes. Seasoned rice or pasta mixes.  Choose: Unsalted snack items. Low-sodium cereals, oats, puffed wheat and rice, shredded wheat. English muffins and bread. Pasta. Meats  Avoid: Salted, canned, smoked, spiced, pickled meats, including fish and poultry. Bacon, ham, sausage, cold cuts, hot dogs, anchovies.  Choose: Low-sodium canned tuna and salmon. Fresh or frozen meat, poultry, and fish. Dairy  Avoid: Processed cheese and spreads. Cottage cheese. Buttermilk and condensed milk. Regular cheese.  Choose: Milk. Low-sodium cottage cheese. Yogurt. Sour cream. Low-sodium cheese. Fruits and Vegetables  Avoid: Regular canned vegetables. Regular canned tomato sauce and paste. Frozen vegetables in sauces. Olives. Rosita Fire. Relishes. Sauerkraut.  Choose: Low-sodium canned vegetables. Low-sodium tomato sauce and paste. Frozen or fresh vegetables. Fresh and frozen fruit. Condiments  Avoid: Canned and packaged gravies. Worcestershire sauce. Tartar sauce. Barbecue sauce. Soy sauce. Steak sauce. Ketchup. Onion, garlic, and table salt. Meat flavorings and tenderizers.  Choose: Fresh and dried herbs and spices. Low-sodium varieties of mustard and ketchup. Lemon juice. Tabasco sauce. Horseradish. SAMPLE 2 GRAM SODIUM MEAL PLAN Breakfast / Sodium (mg)  1  cup low-fat milk / 143 mg  2 slices whole-wheat toast / 270 mg  1 tbs heart-healthy margarine / 153 mg  1 hard-boiled egg / 139 mg  1 small orange / 0 mg Lunch / Sodium (mg)  1 cup raw carrots / 76 mg   cup hummus / 298 mg  1 cup low-fat milk / 143 mg   cup red grapes / 2 mg  1 whole-wheat pita bread / 356 mg Dinner / Sodium (mg)  1 cup whole-wheat pasta / 2 mg  1 cup low-sodium tomato sauce / 73 mg  3 oz lean ground beef / 57  mg  1 small side salad (1 cup raw spinach leaves,  cup cucumber,  cup yellow bell pepper) with 1 tsp olive oil and 1 tsp red wine vinegar / 25 mg Snack / Sodium (mg)  1 container low-fat vanilla yogurt / 107 mg  3 graham cracker squares / 127 mg Nutrient Analysis  Calories: 2033  Protein: 77 g  Carbohydrate: 282 g  Fat: 72 g  Sodium: 1971 mg Document Released: 08/13/2005 Document Revised: 11/05/2011 Document Reviewed: 11/14/2009 Exit[employer] Patient Information 2014 [place], [place].

## 2013-06-17 NOTE — Progress Notes (Signed)
[address] [place], [place]  [address] Phone: [phone] Fax:  [phone]  Date:  06/17/2013   ID:  [name] Mans Sr., DOB 11/23/1919, MRN [id number]  PCP:  Sanda Linger, MD  Primary Cardiologist:  Dr. Rollene Rotunda     History of Present Illness: [name] G Howland Sr. is a 79 y.o. male who returns for f/u.  He has a hx of CAD, s/p CABG in 2007, s/p stent to the OM in 07/2007, combined systolic and diastolic CHF, CKD and permanent AFib on chronic Coumadin therapy. I have followed him closely for volume overload over the past several months.  Recently his weight had dropped 20 lbs in 7 days.  He has a hx of non-compliance with his medications.  I was concerned that he had suddenly started to take his medications as prescribed.  I cut back on his diuretics.  He was then admitted with orthostatic hypotension.  He was given IVFs.  Torsemide and Imdur were held.  I then placed him back on a low dose of lasix due to increasing edema.  His daughter and her husband have done an excellent job helping with his medications and monitoring his weights.    He was recently seen by Jacolyn Reedy, PA-C 10/9 for increased weight (up 8 lbs) and LE edema.  Lasix was increased to 40 mg QD.  He was asked to f/u today.  He is here with his daughter.  She notes his LE edema is slightly improved.  He denies dyspnea, chest pain, syncope, orthopnea, PND.    Labs (9/13):    K 3.3, Cr 2.7 Labs (10/13):  K 4.2, Cr 2.3 Labs (1/14):    K 5.1, Cr 1.9 Labs (2/14):    K 5 => 4.2, Cr 2.4 => 2.4 Labs (3/14):    K 3.9=>4.2, Cr 2.4 => 1.73=>1.9, Hgb 11, Plt 123K Labs (4/14):    K 4.4, Cr 1.9 => 2.1, Hgb 10.6, TSH 0.01 Labs (5/14):    K 4.3, Cr 2.2 => 2.15 => 2.2, Hgb 10.6, Plt 97K, TSH 8.87, FT3 2.3, FT4 5.5  Labs (7/14):    K 4.2, Cr 2.1  Labs (9/14):    K 4.3, Cr 2.1, Hgb 10.1, PLT 110K, TSH 2.96 Labs (10/14):  K 3.8, Cr 2.3  Wt Readings from Last 3 Encounters:  06/17/13 166 lb (75.297 kg)    06/12/13 162 lb 8 oz (73.71 kg)  06/04/13 162 lb (73.483 kg)     Past Medical History  Diagnosis Date  . Coronary artery disease     a.  s/p MI;  b. s/p CABG 2007;  c. cath 12/08: LM occluded, pRCA 80%, dRCA 50%, PLB 90%, S-PDA ok, S-Dx ok with occluded dist Dx; L-LAD ok, S-OM ok with 90% after graft insertion . . . . distal OM treated with DES  . Ischemic cardiomyopathy     echo 6/12 EF 35-40%, apical, dAnt, mid to dist inf-lat HK, mild AI, mod MR, mod BAE, PASP 44  . Chronic combined systolic and diastolic heart failure     admx 8/13;   . Hypertension   . Atrial fibrillation     a. on coumadin.  . CKD (chronic kidney disease), stage IV   . CVA (cerebral infarction)     left frontal  . Anemia     chronic  . Hypothyroidism   . Leg muscle spasm   . GERD (gastroesophageal reflux disease)   . Skin cancer  Current Outpatient Prescriptions  Medication Sig Dispense Refill  . carvedilol (COREG) 3.125 MG tablet TAKE 1 TABLET BY MOUTH TWICE DAILY  60 tablet  6  . Dietary Management Product (VASCULERA) TABS Take 1 capsule by mouth daily.  90 tablet  3  . fenofibrate 54 MG tablet TAKE 1 TABLET BY MOUTH ONCE DAILY  30 tablet  6  . furosemide (LASIX) 20 MG tablet Take 2 tab daily for 1 week then decrease to 1 tab daily  45 tablet  6  . l-methylfolate-B6-B12 (METANX) 3-35-2 MG TABS TAKE 1 TABLET BY MOUTH TWICE DAILY  30 tablet  0  . levothyroxine (SYNTHROID, LEVOTHROID) 25 MCG tablet Take 1 tablet (25 mcg total) by mouth daily before breakfast.  90 tablet  1  . nitroGLYCERIN (NITROSTAT) 0.4 MG SL tablet Place 0.4 mg under the tongue every 5 (five) minutes as needed for chest pain.      . silodosin (RAPAFLO) 8 MG CAPS capsule Take 1 capsule (8 mg total) by mouth daily with breakfast.  90 capsule  3  . warfarin (COUMADIN) 2.5 MG tablet TAKE AS DIRECTED BY ANTICOAGULATION CLINIC  40 tablet  3   No current facility-administered medications for this visit.    Allergies:    Allergies   Allergen Reactions  . Ace Inhibitors     Cannot remember the reaction    Social History:  The patient  reports that he has never smoked. He has never used smokeless tobacco. He reports that he does not drink alcohol or use illicit drugs.   Family History:  The patient's family history includes Cancer in his mother; Stroke in his father.   ROS:  Please see the history of present illness.    All other systems reviewed and negative.   PHYSICAL EXAM: VS:  BP 130/70  Pulse 82  Ht 5\' 11"  (1.803 m)  Wt 166 lb (75.297 kg)  BMI 23.16 kg/m2 Well nourished, well developed, in no acute distress HEENT: normal Neck: + JVD   Cardiac:  normal S1, S2; irregularly irregular; no murmur Lungs:  Clear to auscultation bilaterally, no wheezing, rhonchi or rales Abd: soft, nontender  Ext: 1-2+bilateral LE edema Skin: warm and dry  Neuro:  CNs 2-12 intact, no focal abnormalities noted  EKG:  Atrial fibrillation, HR 82  ASSESSMENT AND PLAN:  1. Acute on Chronic Combined Systolic and Diastolic CHF:  He has had steadily increasing volume over the last few months.  He has gained about 20 lbs since April.  I believe his baseline weight (on our scales) is about 151-155.  He is non-compliant with salt restriction.  He eats at [employer] quite often.  We discussed the importance of low Na diet again.  He was previously on Demadex 20 bid at one point last year.  I will increase his Lasix to 60 QD x 2 days, then Lasix 40 QD.  I have asked him to weigh daily and to bring those in for review in a week.  Check a BMET in 1 week.  We will weigh him then as well.    2. Coronary Artery Disease:  No angina.  Continue current Rx. 3. Atrial Fibrillation:  Rate controlled.  He remains on coumadin.   4. Chronic Kidney Disease:  Creatinine has remained stable.  Check BMET in 1 week.  5. Hypothyroidism:  Managed by PCP.    6. Disposition:  Follow up with Dr. Rollene Rotunda in 06/2013 as planned.    Signed, Tereso Newcomer,  PA-C  1:44 PM 06/17/2013

## 2013-06-18 ENCOUNTER — Other Ambulatory Visit: Payer: Self-pay | Admitting: *Deleted

## 2013-06-18 DIAGNOSIS — N289 Disorder of kidney and ureter, unspecified: Secondary | ICD-10-CM

## 2013-06-22 ENCOUNTER — Telehealth: Payer: Self-pay | Admitting: Internal Medicine

## 2013-06-22 DIAGNOSIS — IMO0001 Reserved for inherently not codable concepts without codable children: Secondary | ICD-10-CM

## 2013-06-22 NOTE — Telephone Encounter (Signed)
06/22/2013   Lori left message stating that her father had used samples of Vasculara and is now requesting a script for [name] for her father.  Please send script to [facility] at [address] and [address].  Also contact Lawson Fiscal when script is sent so it can be picked up; [phone]

## 2013-06-23 MED ORDER — VASCULERA PO TABS: 1.0000 | ORAL_TABLET | Freq: Every day | ORAL | Status: DC

## 2013-06-23 NOTE — Telephone Encounter (Signed)
RX sent e-script to [facility] and daughter notified per her request.

## 2013-06-24 ENCOUNTER — Other Ambulatory Visit: Payer: Self-pay | Admitting: Neurology

## 2013-06-26 ENCOUNTER — Other Ambulatory Visit (INDEPENDENT_AMBULATORY_CARE_PROVIDER_SITE_OTHER): Payer: Medicare Other

## 2013-06-26 DIAGNOSIS — N289 Disorder of kidney and ureter, unspecified: Secondary | ICD-10-CM

## 2013-06-26 LAB — BASIC METABOLIC PANEL
BUN: 57 mg/dL — ABNORMAL HIGH (ref 6–23)
CO2: 28 mEq/L (ref 19–32)
Calcium: 9.1 mg/dL (ref 8.4–10.5)
Creatinine, Ser: 2.4 mg/dL — ABNORMAL HIGH (ref 0.4–1.5)
GFR: 27.09 mL/min — ABNORMAL LOW (ref >60.00)
Glucose, Bld: 129 mg/dL — ABNORMAL HIGH (ref 70–99)

## 2013-06-29 ENCOUNTER — Other Ambulatory Visit: Payer: Medicare Other

## 2013-06-29 ENCOUNTER — Ambulatory Visit (INDEPENDENT_AMBULATORY_CARE_PROVIDER_SITE_OTHER): Payer: Medicare Other | Admitting: General Practice

## 2013-06-29 ENCOUNTER — Other Ambulatory Visit: Payer: Self-pay | Admitting: *Deleted

## 2013-06-29 DIAGNOSIS — N289 Disorder of kidney and ureter, unspecified: Secondary | ICD-10-CM

## 2013-06-29 DIAGNOSIS — Z7901 Long term (current) use of anticoagulants: Secondary | ICD-10-CM

## 2013-06-29 DIAGNOSIS — I509 Heart failure, unspecified: Secondary | ICD-10-CM

## 2013-06-29 DIAGNOSIS — Z8679 Personal history of other diseases of the circulatory system: Secondary | ICD-10-CM

## 2013-06-29 DIAGNOSIS — I4891 Unspecified atrial fibrillation: Secondary | ICD-10-CM

## 2013-06-29 LAB — POCT INR: INR: 3.4

## 2013-07-01 ENCOUNTER — Telehealth: Payer: Self-pay | Admitting: Physician Assistant

## 2013-07-01 NOTE — Telephone Encounter (Signed)
Weights received and reviewed from home: 10/23:  159.8 10/24:  158.4 10/25:  156.6 10/26:  156.8 10/27:  158.6 10/29:  157.2 10/30:  157.4 10/31:  156.6  Weights fairly stable/slightly improved. Continue with current treatment plan. Tereso Newcomer, PA-C   07/01/2013 1:29 PM

## 2013-07-01 NOTE — Telephone Encounter (Signed)
Left message to call back  

## 2013-07-03 NOTE — Telephone Encounter (Signed)
Notified wife that Lilian Coma states weight are stable and continue on same treatment plan.  She verbalizes understanding.

## 2013-07-05 ENCOUNTER — Other Ambulatory Visit: Payer: Self-pay | Admitting: Internal Medicine

## 2013-07-10 ENCOUNTER — Ambulatory Visit: Payer: Medicare Other | Admitting: *Deleted

## 2013-07-10 ENCOUNTER — Telehealth: Payer: Self-pay | Admitting: Cardiology

## 2013-07-10 DIAGNOSIS — I509 Heart failure, unspecified: Secondary | ICD-10-CM

## 2013-07-10 DIAGNOSIS — N289 Disorder of kidney and ureter, unspecified: Secondary | ICD-10-CM

## 2013-07-10 NOTE — Telephone Encounter (Signed)
Left message for daughter OK to have blood work drawn today.  BMP was to have been repeated in two weeks per Dr Antoine Poche

## 2013-07-10 NOTE — Telephone Encounter (Signed)
New message    Daughter thinks dad is supposed to come in today for lab---but the computer says Monday.  Which day is correct? It is to check his kidney function and she do not want to wait until Monday if he is due today.  Please call before 2:30 today

## 2013-07-11 LAB — BASIC METABOLIC PANEL
CO2: 26 mEq/L (ref 19–32)
Chloride: 106 mEq/L (ref 96–112)
Creat: 2.46 mg/dL — ABNORMAL HIGH (ref 0.50–1.35)
Glucose, Bld: 155 mg/dL — ABNORMAL HIGH (ref 70–99)
Potassium: 4.2 mEq/L (ref 3.5–5.3)
Sodium: 143 mEq/L (ref 135–145)

## 2013-07-13 ENCOUNTER — Other Ambulatory Visit: Payer: Medicare Other

## 2013-07-20 ENCOUNTER — Ambulatory Visit (INDEPENDENT_AMBULATORY_CARE_PROVIDER_SITE_OTHER): Payer: Medicare Other | Admitting: Cardiology

## 2013-07-20 ENCOUNTER — Ambulatory Visit (INDEPENDENT_AMBULATORY_CARE_PROVIDER_SITE_OTHER): Payer: Medicare Other | Admitting: Pharmacist

## 2013-07-20 ENCOUNTER — Encounter (INDEPENDENT_AMBULATORY_CARE_PROVIDER_SITE_OTHER): Payer: Self-pay

## 2013-07-20 ENCOUNTER — Encounter: Payer: Self-pay | Admitting: Cardiology

## 2013-07-20 VITALS — BP 128/76 | HR 93 | Ht 71.0 in | Wt 156.0 lb

## 2013-07-20 DIAGNOSIS — I4891 Unspecified atrial fibrillation: Secondary | ICD-10-CM

## 2013-07-20 DIAGNOSIS — Z8679 Personal history of other diseases of the circulatory system: Secondary | ICD-10-CM

## 2013-07-20 DIAGNOSIS — I5042 Chronic combined systolic (congestive) and diastolic (congestive) heart failure: Secondary | ICD-10-CM

## 2013-07-20 DIAGNOSIS — Z7901 Long term (current) use of anticoagulants: Secondary | ICD-10-CM

## 2013-07-20 NOTE — Patient Instructions (Addendum)
Please take extra 20 mg of Furosemide for the next 3 days. Then resume current dose. Continue all other medications as listed.  Continue to limit the salt in your diet.  Follow up in 6 weeks with Tereso Newcomer, PA.

## 2013-07-20 NOTE — Progress Notes (Signed)
HPI The patient presents for followup of his cardiomyopathy. Since I last saw him he was seen in the office and had a couple of days of higher diuretic prescribed.  His weights are stable currently but he does have some increased lower extremity swelling. He and his wife to drive and so they often will take himself out to eat and clearly are not watching the salt as much as I would like. It is not necessarily keeping his feet up as much either. He's not describing any new PND or orthopnea. He's not describing any new chest pressure, neck or arm discomfort.   Allergies  Allergen Reactions  . Ace Inhibitors     Cannot remember the reaction    Current Outpatient Prescriptions on File Prior to Visit  Medication Sig Dispense Refill  . carvedilol (COREG) 3.125 MG tablet TAKE 1 TABLET BY MOUTH TWICE DAILY  60 tablet  6  . Dietary Management Product (VASCULERA) TABS Take 1 capsule by mouth daily.  90 tablet  3  . fenofibrate 54 MG tablet TAKE 1 TABLET BY MOUTH ONCE DAILY  30 tablet  6  . furosemide (LASIX) 20 MG tablet TAKE 40 MG DAILY  60 tablet  11  . l-methylfolate-B6-B12 (METANX) 3-35-2 MG TABS TAKE 1 TABLET BY MOUTH TWICE DAILY  60 tablet  4  . levothyroxine (SYNTHROID, LEVOTHROID) 25 MCG tablet TAKE 1 TABLET BY MOUTH ONCE DAILY BEFORE BREAKFAST  90 tablet  1  . nitroGLYCERIN (NITROSTAT) 0.4 MG SL tablet Place 0.4 mg under the tongue every 5 (five) minutes as needed for chest pain.      . silodosin (RAPAFLO) 8 MG CAPS capsule Take 1 capsule (8 mg total) by mouth daily with breakfast.  90 capsule  3   No current facility-administered medications on file prior to visit.    Past Medical History  Diagnosis Date  . Coronary artery disease     a.  s/p MI;  b. s/p CABG 2007;  c. cath 12/08: LM occluded, pRCA 80%, dRCA 50%, PLB 90%, S-PDA ok, S-Dx ok with occluded dist Dx; L-LAD ok, S-OM ok with 90% after graft insertion . . . . distal OM treated with DES  . Ischemic cardiomyopathy     echo  6/12 EF 35-40%, apical, dAnt, mid to dist inf-lat HK, mild AI, mod MR, mod BAE, PASP 44  . Chronic combined systolic and diastolic heart failure     admx 8/13;   . Hypertension   . Atrial fibrillation     a. on coumadin.  . CKD (chronic kidney disease), stage IV   . CVA (cerebral infarction)     left frontal  . Anemia     chronic  . Hypothyroidism   . Leg muscle spasm   . GERD (gastroesophageal reflux disease)   . Skin cancer     Past Surgical History  Procedure Laterality Date  . Coronary artery bypass graft  2007  . Coronary angioplasty with stent placement      ROS  As stated in the HPI and negative for all other systems.  PHYSICAL EXAM:   BP 128/76  Pulse 93  Ht 5\' 11"  (1.803 m)  Wt 156 lb (70.761 kg)  BMI 21.77 kg/m2 GENERAL:  Frail-appearing NECK:  JVD at 45 degrees to the jaw at 45 degrees, waveform within normal limits, carotid upstroke brisk and symmetric, no bruits, no thyromegaly LUNGS:  Clear to auscultation bilaterally BACK:  No CVA tenderness CHEST:  Healed sternotomy scar  HEART:  PMI not displaced or sustained,,S1 and S2 within normal limits, no S3, no clicks, no rubs, no murmurs, irregluar ABD:  Flat, positive bowel sounds normal in frequency in pitch, no bruits, no rebound, no guarding, no midline pulsatile mass, no hepatomegaly, no splenomegaly EXT:  2 plus pulses throughout, mild edema, no cyanosis no clubbing, mild edema   ASSESSMENT:  ISCHEMIC CARDIOMYOPATHY -  He seems to have some excess volume and will take extra diuretic for the next 2 days. I again had a long discussion with him about salt and fluid intake. I think difficulty with compliance is when he gets into trouble.  CAD -  The patient has no new sypmtoms. No further cardiovascular testing is indicated. We will continue with aggressive risk reduction and meds as listed.   Atrial fibrillation -  The patient tolerates this rhythm with rate control and anticoagulation.  His stroke risk is  9% per year.  He will continue with warfarin.

## 2013-08-10 ENCOUNTER — Other Ambulatory Visit: Payer: Self-pay | Admitting: *Deleted

## 2013-08-10 ENCOUNTER — Ambulatory Visit (INDEPENDENT_AMBULATORY_CARE_PROVIDER_SITE_OTHER): Payer: Medicare Other | Admitting: *Deleted

## 2013-08-10 DIAGNOSIS — I4891 Unspecified atrial fibrillation: Secondary | ICD-10-CM

## 2013-08-10 DIAGNOSIS — Z8679 Personal history of other diseases of the circulatory system: Secondary | ICD-10-CM

## 2013-08-10 DIAGNOSIS — Z7901 Long term (current) use of anticoagulants: Secondary | ICD-10-CM

## 2013-08-10 LAB — POCT INR: INR: 2.4

## 2013-08-10 MED ORDER — WARFARIN SODIUM 2.5 MG PO TABS: 2.5000 mg | ORAL_TABLET | ORAL | Status: DC

## 2013-08-18 ENCOUNTER — Telehealth: Payer: Self-pay | Admitting: Neurology

## 2013-08-18 NOTE — Telephone Encounter (Signed)
Left message for patient to call and reschedule 11/04/13 appointment per Dr. Dohmeier's schedule.  °

## 2013-08-19 ENCOUNTER — Encounter: Payer: Self-pay | Admitting: Neurology

## 2013-08-24 ENCOUNTER — Ambulatory Visit (INDEPENDENT_AMBULATORY_CARE_PROVIDER_SITE_OTHER): Payer: Medicare Other | Admitting: Physician Assistant

## 2013-08-24 ENCOUNTER — Encounter: Payer: Self-pay | Admitting: Physician Assistant

## 2013-08-24 VITALS — BP 112/49 | HR 76 | Ht 71.0 in | Wt 160.4 lb

## 2013-08-24 DIAGNOSIS — I4891 Unspecified atrial fibrillation: Secondary | ICD-10-CM

## 2013-08-24 DIAGNOSIS — I5042 Chronic combined systolic (congestive) and diastolic (congestive) heart failure: Secondary | ICD-10-CM

## 2013-08-24 DIAGNOSIS — N184 Chronic kidney disease, stage 4 (severe): Secondary | ICD-10-CM

## 2013-08-24 DIAGNOSIS — I2510 Atherosclerotic heart disease of native coronary artery without angina pectoris: Secondary | ICD-10-CM

## 2013-08-24 NOTE — Progress Notes (Signed)
[address] [place], [place]  [address] Phone: [phone] Fax:  [phone]  Date:  08/24/2013   ID:  [name] Mans Sr., DOB 07/08/1920, MRN [id number]  PCP:  Sanda Linger, MD  Primary Cardiologist:  Dr. Rollene Rotunda     History of Present Illness: [name] DITMER Sr. is a 79 y.o. male with a hx of CAD, s/p CABG in 2007, s/p stent to the OM in 07/2007, combined systolic and diastolic CHF, CKD and permanent AFib on chronic Coumadin therapy.  Earlier this year, his weight had dropped 20 lbs in 7 days.  He has a hx of non-compliance with his medications.  I was concerned that he had suddenly started to take his medications as prescribed.  I cut back on his diuretics.  He was then admitted with orthostatic hypotension.  He was given IVFs.  Torsemide and Imdur were held.  He was then placed on a low dose of lasix due to increasing edema.   He saw Dr. Rollene Rotunda 07/20/13 and was volume overloaded.  His Lasix was increased for several days.  He is doing well.  He is here with his daughter.  He denies significant dyspnea.  No chest pain.  LE edema is unchanged. No orthopnea, PND, syncope.    Recent Labs: 05/06/2013: ALT 19; Hemoglobin 10.1*; TSH 2.96  07/10/2013: Creatinine 2.46*; Potassium 4.2    Wt Readings from Last 3 Encounters:  08/24/13 160 lb 6.4 oz (72.757 kg)  07/20/13 156 lb (70.761 kg)  06/17/13 166 lb (75.297 kg)     Past Medical History  Diagnosis Date  . Coronary artery disease     a.  s/p MI;  b. s/p CABG 2007;  c. cath 12/08: LM occluded, pRCA 80%, dRCA 50%, PLB 90%, S-PDA ok, S-Dx ok with occluded dist Dx; L-LAD ok, S-OM ok with 90% after graft insertion . . . . distal OM treated with DES  . Ischemic cardiomyopathy     echo 6/12 EF 35-40%, apical, dAnt, mid to dist inf-lat HK, mild AI, mod MR, mod BAE, PASP 44  . Chronic combined systolic and diastolic heart failure     admx 8/13;   . Hypertension   . Atrial fibrillation     a. on  coumadin.  . CKD (chronic kidney disease), stage IV   . CVA (cerebral infarction)     left frontal  . Anemia     chronic  . Hypothyroidism   . Leg muscle spasm   . GERD (gastroesophageal reflux disease)   . Skin cancer     Current Outpatient Prescriptions  Medication Sig Dispense Refill  . carvedilol (COREG) 3.125 MG tablet TAKE 1 TABLET BY MOUTH TWICE DAILY  60 tablet  6  . Dietary Management Product (VASCULERA) TABS Take 1 capsule by mouth daily.  90 tablet  3  . fenofibrate 54 MG tablet TAKE 1 TABLET BY MOUTH ONCE DAILY  30 tablet  6  . furosemide (LASIX) 20 MG tablet TAKE 40 MG DAILY  60 tablet  11  . l-methylfolate-B6-B12 (METANX) 3-35-2 MG TABS TAKE 1 TABLET BY MOUTH TWICE DAILY  60 tablet  4  . levothyroxine (SYNTHROID, LEVOTHROID) 25 MCG tablet TAKE 1 TABLET BY MOUTH ONCE DAILY BEFORE BREAKFAST  90 tablet  1  . nitroGLYCERIN (NITROSTAT) 0.4 MG SL tablet Place 0.4 mg under the tongue every 5 (five) minutes as needed for chest pain.      . silodosin (RAPAFLO) 8 MG CAPS  capsule Take 1 capsule (8 mg total) by mouth daily with breakfast.  90 capsule  3  . warfarin (COUMADIN) 2.5 MG tablet Take 1 tablet (2.5 mg total) by mouth as directed.  45 tablet  3   No current facility-administered medications for this visit.    Allergies:    Allergies  Allergen Reactions  . Ace Inhibitors     Cannot remember the reaction    Social History:  The patient  reports that he has never smoked. He has never used smokeless tobacco. He reports that he does not drink alcohol or use illicit drugs.   Family History:  The patient's family history includes Cancer in his mother; Stroke in his father.   ROS:  Please see the history of present illness.     All other systems reviewed and negative.   PHYSICAL EXAM: VS:  BP 112/49  Pulse 76  Ht 5\' 11"  (1.803 m)  Wt 160 lb 6.4 oz (72.757 kg)  BMI 22.38 kg/m2 Well nourished, well developed, in no acute distress HEENT: normal Neck: no JVD   Cardiac:   normal S1, S2; irregularly irregular; no murmur Lungs:  Clear to auscultation bilaterally, no wheezing, rhonchi or rales Abd: soft, nontender  Ext: 1 bilateral LE edema (ankle to mid calf) Skin: warm and dry  Neuro:  CNs 2-12 intact, no focal abnormalities noted  EKG:   AFib, HR 76, RBBB, no changes  ASSESSMENT AND PLAN:  1. Chronic Combined Systolic and Diastolic CHF:  His volume remains stable.  He likely has venous insufficiency contributing to his LE edema.  We discussed keeping his legs elevated, wearing compression stockings and limiting his salt.   2. Coronary Artery Disease:  No angina.  Continue current Rx. 3. Atrial Fibrillation:  Rate controlled.  He remains on coumadin.   4. Chronic Kidney Disease:  Stable by recent measurements.  5. Hypothyroidism:  Managed by PCP.    6. Disposition:  Follow up with me in 3 mos and Dr. Rollene Rotunda in 6 mos.     Signed, Tereso Newcomer, PA-C  2:52 PM 08/24/2013

## 2013-08-24 NOTE — Patient Instructions (Signed)
Your physician recommends that you schedule a follow-up appointment in: 3 MONTHS WITH [facility]  Your physician recommends that you schedule a follow-up appointment in: 6 MONTHS WITH DR. HOCHREIN  YOUR PROVIDER WOULD LIKE FOR YOU TO GET COMPRESSION STOCKING  YOUR PROVIDER WOULD LIKE FOR YOU TO KEEP WALKING 2-3 TIMES A DAY  YOUR PROVIDER WOULD LIKE FOR YOU TO ELEVATE YOUR LEGS  Your physician recommends that you continue on your current medications as directed. Please refer to the Current Medication list given to you today.

## 2013-09-07 ENCOUNTER — Ambulatory Visit (INDEPENDENT_AMBULATORY_CARE_PROVIDER_SITE_OTHER): Payer: Medicare Other | Admitting: *Deleted

## 2013-09-07 DIAGNOSIS — I4891 Unspecified atrial fibrillation: Secondary | ICD-10-CM

## 2013-09-07 DIAGNOSIS — Z8679 Personal history of other diseases of the circulatory system: Secondary | ICD-10-CM

## 2013-09-07 DIAGNOSIS — Z7901 Long term (current) use of anticoagulants: Secondary | ICD-10-CM

## 2013-09-07 LAB — POCT INR: INR: 3.7

## 2013-09-21 ENCOUNTER — Ambulatory Visit (INDEPENDENT_AMBULATORY_CARE_PROVIDER_SITE_OTHER): Payer: Medicare Other

## 2013-09-21 DIAGNOSIS — Z8679 Personal history of other diseases of the circulatory system: Secondary | ICD-10-CM

## 2013-09-21 DIAGNOSIS — Z7901 Long term (current) use of anticoagulants: Secondary | ICD-10-CM

## 2013-09-21 DIAGNOSIS — I4891 Unspecified atrial fibrillation: Secondary | ICD-10-CM

## 2013-09-21 LAB — POCT INR: INR: 3.1

## 2013-10-19 ENCOUNTER — Ambulatory Visit (INDEPENDENT_AMBULATORY_CARE_PROVIDER_SITE_OTHER): Payer: Medicare Other | Admitting: Pharmacist

## 2013-10-19 DIAGNOSIS — Z8679 Personal history of other diseases of the circulatory system: Secondary | ICD-10-CM

## 2013-10-19 DIAGNOSIS — I4891 Unspecified atrial fibrillation: Secondary | ICD-10-CM

## 2013-10-19 DIAGNOSIS — Z7901 Long term (current) use of anticoagulants: Secondary | ICD-10-CM

## 2013-10-19 LAB — POCT INR: INR: 2.3

## 2013-10-20 ENCOUNTER — Other Ambulatory Visit: Payer: Self-pay | Admitting: Neurology

## 2013-11-02 ENCOUNTER — Telehealth: Payer: Self-pay | Admitting: Cardiology

## 2013-11-02 NOTE — Telephone Encounter (Signed)
Spoke with daughter who was concerned because pt c/o some chest discomfort the other day that he feels like was r/t irregular heartbeat.  Daughter states pt doesn't go out much any more and went out Saturday - the chest discomfort occurred then.  It resolved on its own and he hasn't had any more.  Margarita Grizzle will continue to monitor and call back if further concerns.  Pt does have an upcoming appt scheduled with Richardson Dopp, PA.

## 2013-11-02 NOTE — Telephone Encounter (Signed)
New Prob    Daughter calling on behalf of pt. States he recently had chest discomfort that he believes was irregular heart beat. Daughter would like to speak to a nurse regarding her concerns.

## 2013-11-04 ENCOUNTER — Ambulatory Visit: Payer: Self-pay | Admitting: Neurology

## 2013-11-09 ENCOUNTER — Telehealth: Payer: Self-pay | Admitting: Cardiology

## 2013-11-09 ENCOUNTER — Encounter: Payer: Self-pay | Admitting: Internal Medicine

## 2013-11-09 ENCOUNTER — Ambulatory Visit (INDEPENDENT_AMBULATORY_CARE_PROVIDER_SITE_OTHER): Payer: Medicare Other | Admitting: Internal Medicine

## 2013-11-09 VITALS — BP 118/60 | HR 91 | Temp 98.0°F | Resp 14 | Wt 162.1 lb

## 2013-11-09 DIAGNOSIS — J069 Acute upper respiratory infection, unspecified: Secondary | ICD-10-CM

## 2013-11-09 DIAGNOSIS — I4891 Unspecified atrial fibrillation: Secondary | ICD-10-CM

## 2013-11-09 NOTE — Telephone Encounter (Signed)
New problem   Per pt's daugher pt is in A-Fib and need to know if she need to take the pt to Dr Linna Darner or come to cardiologist office. Please call pt's daughter asap.

## 2013-11-09 NOTE — Patient Instructions (Signed)
Zicam Melts or Zinc lozenges as per package label for scratchy throat . Complementary options include  vitamin C 2000 mg daily; & Echinacea for 4-7 days. Report persistent or progressive fever; discolored nasal or chest secretions; or frontal headache or facial  pain.  Plain Mucinex (NOT D) for thick secretions ;force NON dairy fluids .   Nasal cleansing in the shower as discussed with lather of mild shampoo.After 10 seconds wash off lather while  exhaling through nostrils. Make sure that all residual soap is removed to prevent irritation.  Flonase OR Nasacort AQ 1 spray in each nostril twice a day as needed. Use the "crossover" technique into opposite nostril spraying toward opposite ear @ 45 degree angle, not straight up into nostril.  Use a Neti pot daily only  as needed for significant sinus congestion; going from open side to congested side . Plain Allegra (NOT D )  160 daily , Loratidine 10 mg , OR Zyrtec 10 mg @ bedtime  as needed for itchy eyes & sneezing.     

## 2013-11-09 NOTE — Progress Notes (Signed)
   Subjective:    Patient ID: [name]., male    DOB: Dec 01, 1919, 80 y.o.   MRN: [id number]  HPI  Symptoms began 11/08/13 as rhinitis, head congestion, and chest congestion. All secretions have been clear with no purulence  He has taken acetaminophen.  He did take the flu shot. He does not smoke.   He has a history of atrial fib and feels that he's had some irregularity.      Review of Systems He specifically denies fever, chills, or sweats.  He is not having frontal headache, facial pain, otic pain, or otic discharge.  There is no associated shortness of breath or wheezing.  Extrinsic symptoms have been minor.      Objective:   Physical Exam General appearance:good health ;well nourished; no acute distress or increased work of breathing is present.  No  lymphadenopathy about the head, neck, or axilla noted. Appears younger than stated age  Eyes: No conjunctival inflammation or lid edema is present. There is no scleral icterus.  Ears:  External ear exam shows no significant lesions or deformities.  Aid on R ; wax in canal on L.  Nose:  External nasal examination shows no deformity or inflammation. Nasal mucosa are boggy and moist without lesions or exudates. No septal dislocation or deviation.No obstruction to airflow.   Oral exam: Dental hygiene is good; lips and gums are healthy appearing.There is no oropharyngeal erythema or exudate noted. Slightly hoarse  Neck:  No deformities, , masses, or tenderness noted.   Supple with full range of motion without pain.   Heart:  Normal rate and slightly  irregular rhythm. S1 and S2 normal without gallop, murmur, click, rub or other extra sounds.   Lungs:Chest clear to auscultation; no wheezes, rhonchi,rales ,or rubs present.No increased work of breathing.    Extremities:  No cyanosis, edema, or clubbing  noted    Skin: Warm & dry          Assessment & Plan:  #1 viral RTI #2 slow Atrial Fib See orders

## 2013-11-09 NOTE — Progress Notes (Signed)
Pre visit review using our clinic review tool, if applicable. No additional management support is needed unless otherwise documented below in the visit note. 

## 2013-11-09 NOTE — Telephone Encounter (Signed)
Spoke with daughter who reports pt complaining of increased chest congestion and a sore throat.  He is being seen by his PCP this afternoon.  She also reports pt stating that he is having a jolting feeling in his chest from time to time.  He denies any chest pain or SOB per daughter.  Advised pt has been in At Fib for sometime now.  There is the question of how fast his heart rate is.  Asked daughter to speak with PCP today about this while pt is there for the appointment.  Pt states he is not feeling the jolting now and that he feels fine.  She is going to continue to decrease his caffeine intake as much as possible.

## 2013-11-12 ENCOUNTER — Telehealth: Payer: Self-pay | Admitting: Internal Medicine

## 2013-11-12 NOTE — Telephone Encounter (Signed)
Patient Information:  Caller Name: Margarita Grizzle  Phone: [phone]  Patient: [name], [name]  Gender: Male  DOB: 1919/11/13  Age: 80 Years  PCP: Scarlette Calico (Adults only)  Office Follow Up:  Does the office need to follow up with this patient?: Yes  Instructions For The Office: Follow up call needed per disposition; Please call Laurie/daughter  RN Note:  Adult children see patient twice daily. No thermometer in home. Cough and congestion not worsening but still present. Reported felt chill this morning. Afebrile/tactile.  Appetite per normal. Drinking Coke and water with meds.  Instructed not to have caffeine or Coke which will > frequency of stools.  Voided this morning approximately 0800. Denzil reports one "wash out" stool 11/11/13.  Had two back to back diarrhea stools 11/11/13 of diarrhea.  Related new onset of mild diarrhea after beginning Vitamin C. Asking to stop using Vit C and or nasal spray. Instructed to keep record with time of voids and stools.  Symptoms  Reason For Call & Symptoms: Diarrhea and cough.  Saw Dr Linna Darner on 11/09/13 for congestion and sore throat.  Concerned about new onset of mild diarrhea that began after started Vit C and nasal spray.  Reviewed Health History In EMR: Yes  Reviewed Medications In EMR: Yes  Reviewed Allergies In EMR: Yes  Reviewed Surgeries / Procedures: Yes  Date of Onset of Symptoms: 11/10/2013  Guideline(s) Used:  Diarrhea  Disposition Per Guideline:   Callback by PCP Today  Reason For Disposition Reached:   Age > 70 years  Advice Given:  Reassurance:  In healthy adults, new-onset diarrhea is usually caused by a viral infection of the intestines, which you can treat at home. Diarrhea is the body's way of getting rid of the infection. Here are some tips on how to keep ahead of the fluid losses.  Fluids:  Drink more fluids, at least 8-10 glasses (8 oz or 240 ml) daily.  For example: sports drinks, diluted fruit juices, soft drinks.  Supplement this with saltine crackers or soups to make certain that you are getting sufficient fluid and salt to meet your body's needs.  Avoid caffeinated beverages (Reason: caffeine is mildly dehydrating).  Nutrition:  Maintaining some food intake during episodes of diarrhea is important.  Ideal initial foods include boiled starches/cereals (e.g., potatoes, rice, noodles, wheat, oats) with a small amount of salt to taste.  Other acceptable foods include: bananas, yogurt, crackers, soup.  As your stools return to normal consistency, resume a normal diet.  Expected Course:  Viral diarrhea lasts 4-7 days. Always worse on days 1 and 2.  Call Back If:  Signs of dehydration occur (e.g., no urine for more than 12 hours, very dry mouth, lightheaded, etc.)  Diarrhea lasts over 7 days  You become worse.  Patient Will Follow Care Advice:  YES

## 2013-11-16 ENCOUNTER — Ambulatory Visit (INDEPENDENT_AMBULATORY_CARE_PROVIDER_SITE_OTHER): Payer: Medicare Other | Admitting: *Deleted

## 2013-11-16 DIAGNOSIS — I4891 Unspecified atrial fibrillation: Secondary | ICD-10-CM

## 2013-11-16 DIAGNOSIS — Z7901 Long term (current) use of anticoagulants: Secondary | ICD-10-CM

## 2013-11-16 DIAGNOSIS — Z8679 Personal history of other diseases of the circulatory system: Secondary | ICD-10-CM

## 2013-11-16 LAB — POCT INR: INR: 2.6

## 2013-11-23 ENCOUNTER — Ambulatory Visit (INDEPENDENT_AMBULATORY_CARE_PROVIDER_SITE_OTHER): Payer: Medicare Other | Admitting: Physician Assistant

## 2013-11-23 ENCOUNTER — Encounter: Payer: Self-pay | Admitting: Physician Assistant

## 2013-11-23 VITALS — BP 118/52 | HR 68 | Ht 71.0 in | Wt 157.8 lb

## 2013-11-23 DIAGNOSIS — I4891 Unspecified atrial fibrillation: Secondary | ICD-10-CM

## 2013-11-23 DIAGNOSIS — N184 Chronic kidney disease, stage 4 (severe): Secondary | ICD-10-CM

## 2013-11-23 DIAGNOSIS — I2510 Atherosclerotic heart disease of native coronary artery without angina pectoris: Secondary | ICD-10-CM

## 2013-11-23 DIAGNOSIS — I5042 Chronic combined systolic (congestive) and diastolic (congestive) heart failure: Secondary | ICD-10-CM

## 2013-11-23 NOTE — Progress Notes (Signed)
[name]. [address], [address], [place]  [address] Phone: [phone] Fax:  [phone]  Date:  11/23/2013   ID:  [name] Amos Sr., DOB 09/17/1919, MRN [id number]  PCP:  Scarlette Calico, MD  Primary Cardiologist:  Dr. Minus Breeding     History of Present Illness: [name] DISMUKE Sr. is a 80 y.o. male with a hx of CAD, s/p CABG in 2007, s/p stent to the OM in 10/4740, combined systolic and diastolic CHF, CKD and permanent AFib on chronic Coumadin therapy.   He has a hx of non-compliance with his medications.   He is doing well.  He is here with his daughter.  He denies significant dyspnea.  No chest pain.  LE edema is unchanged. No orthopnea, PND, syncope.  He had some URI symptoms several weeks ago and he noted increased dyspnea with this.  He feels back to his baseline now.   Recent Labs: 05/06/2013: ALT 19; Hemoglobin 10.1*; TSH 2.96  07/10/2013: Creatinine 2.46*; Potassium 4.2    Wt Readings from Last 3 Encounters:  11/23/13 157 lb 12.8 oz (71.578 kg)  11/09/13 162 lb 1.9 oz (73.537 kg)  08/24/13 160 lb 6.4 oz (72.757 kg)     Past Medical History  Diagnosis Date  . Coronary artery disease     a.  s/p MI;  b. s/p CABG 2007;  c. cath 12/08: LM occluded, pRCA 80%, dRCA 50%, PLB 90%, S-PDA ok, S-Dx ok with occluded dist Dx; L-LAD ok, S-OM ok with 90% after graft insertion . . . . distal OM treated with DES  . Ischemic cardiomyopathy     echo 6/12 EF 35-40%, apical, dAnt, mid to dist inf-lat HK, mild AI, mod MR, mod BAE, PASP 44  . Chronic combined systolic and diastolic heart failure     admx 8/13;   . Hypertension   . Atrial fibrillation     a. on coumadin.  . CKD (chronic kidney disease), stage IV   . CVA (cerebral infarction)     left frontal  . Anemia     chronic  . Hypothyroidism   . Leg muscle spasm   . GERD (gastroesophageal reflux disease)   . Skin cancer     Current Outpatient Prescriptions  Medication Sig Dispense Refill  . carvedilol (COREG)  3.125 MG tablet TAKE 1 TABLET BY MOUTH TWICE DAILY  60 tablet  6  . Dietary Management Product (VASCULERA) TABS Take 1 capsule by mouth daily.  90 tablet  3  . fenofibrate 54 MG tablet TAKE 1 TABLET BY MOUTH ONCE DAILY  30 tablet  6  . furosemide (LASIX) 20 MG tablet TAKE 40 MG DAILY  60 tablet  11  . l-methylfolate-B6-B12 (METANX) 3-35-2 MG TABS TAKE 1 TABLET BY MOUTH TWICE DAILY  60 tablet  5  . levothyroxine (SYNTHROID, LEVOTHROID) 25 MCG tablet TAKE 1 TABLET BY MOUTH ONCE DAILY BEFORE BREAKFAST  90 tablet  1  . nitroGLYCERIN (NITROSTAT) 0.4 MG SL tablet Place 0.4 mg under the tongue every 5 (five) minutes as needed for chest pain.      . silodosin (RAPAFLO) 8 MG CAPS capsule Take 1 capsule (8 mg total) by mouth daily with breakfast.  90 capsule  3  . warfarin (COUMADIN) 2.5 MG tablet Take 1 tablet (2.5 mg total) by mouth as directed.  45 tablet  3   No current facility-administered medications for this visit.    Allergies:    Allergies  Allergen Reactions  .  Ace Inhibitors     Cannot remember the reaction    Social History:  The patient  reports that he has never smoked. He has never used smokeless tobacco. He reports that he does not drink alcohol or use illicit drugs.   Family History:  The patient's family history includes Cancer in his mother; Stroke in his father.   ROS:  Please see the history of present illness.     All other systems reviewed and negative.   PHYSICAL EXAM: VS:  BP 118/52  Pulse 68  Ht 5\' 11"  (1.803 m)  Wt 157 lb 12.8 oz (71.578 kg)  BMI 22.02 kg/m2 Well nourished, well developed, in no acute distress HEENT: normal Neck: no JVD   Cardiac:  normal S1, S2; irregularly irregular; no murmur Lungs:  Clear to auscultation bilaterally, no wheezing, rhonchi or rales Abd: soft, nontender  Ext: trace bilateral LE edema  Skin: warm and dry  Neuro:  CNs 2-12 intact, no focal abnormalities noted  EKG:   AFib, HR 68, RBBB, no changes  ASSESSMENT AND  PLAN:  1. Chronic Combined Systolic and Diastolic CHF:  His volume remains stable.  Check BMET.   2. Coronary Artery Disease:  No angina.  Continue current Rx. 3. Atrial Fibrillation:  Rate controlled.  He remains on coumadin, which he is tolerating.    4. Chronic Kidney Disease:  Check BMET today.  5. Hypothyroidism:  Managed by PCP.    6. Disposition:  Follow up Dr. Minus Breeding in 3 mos.     SignedRichardson Dopp, PA-C  4:01 PM 11/23/2013

## 2013-11-23 NOTE — Patient Instructions (Signed)
NO CHANGES WERE MADE WITH MEDICATIONS TODAY  MAKE SURE TO KEEP YOUR APPT WITH DR. Fobes Hill; BMET

## 2013-11-24 LAB — BASIC METABOLIC PANEL
BUN: 60 mg/dL — AB (ref 6–23)
CHLORIDE: 105 meq/L (ref 96–112)
CO2: 26 meq/L (ref 19–32)
CREATININE: 2.5 mg/dL — AB (ref 0.4–1.5)
Calcium: 8.7 mg/dL (ref 8.4–10.5)
GFR: 26.06 mL/min — ABNORMAL LOW (ref >60.00)
Glucose, Bld: 110 mg/dL — ABNORMAL HIGH (ref 70–99)
Potassium: 4.1 mEq/L (ref 3.5–5.1)
Sodium: 141 mEq/L (ref 135–145)

## 2013-12-08 ENCOUNTER — Other Ambulatory Visit: Payer: Self-pay

## 2013-12-08 MED ORDER — FENOFIBRATE 54 MG PO TABS: ORAL_TABLET | ORAL | Status: DC

## 2013-12-14 ENCOUNTER — Ambulatory Visit (INDEPENDENT_AMBULATORY_CARE_PROVIDER_SITE_OTHER): Payer: Medicare Other | Admitting: Pharmacist

## 2013-12-14 ENCOUNTER — Emergency Department (HOSPITAL_COMMUNITY): Payer: Medicare Other

## 2013-12-14 ENCOUNTER — Encounter (HOSPITAL_COMMUNITY): Payer: Self-pay | Admitting: Emergency Medicine

## 2013-12-14 ENCOUNTER — Emergency Department (HOSPITAL_COMMUNITY)
Admission: EM | Admit: 2013-12-14 | Discharge: 2013-12-14 | Disposition: A | Discharge status: Home / Self Care | Payer: Medicare Other | Attending: Emergency Medicine | Admitting: Emergency Medicine

## 2013-12-14 ENCOUNTER — Ambulatory Visit: Payer: Self-pay | Admitting: Podiatry

## 2013-12-14 DIAGNOSIS — Z888 Allergy status to other drugs, medicaments and biological substances status: Secondary | ICD-10-CM | POA: Insufficient documentation

## 2013-12-14 DIAGNOSIS — I2510 Atherosclerotic heart disease of native coronary artery without angina pectoris: Secondary | ICD-10-CM | POA: Insufficient documentation

## 2013-12-14 DIAGNOSIS — Z7901 Long term (current) use of anticoagulants: Secondary | ICD-10-CM

## 2013-12-14 DIAGNOSIS — Z85828 Personal history of other malignant neoplasm of skin: Secondary | ICD-10-CM | POA: Insufficient documentation

## 2013-12-14 DIAGNOSIS — Z79899 Other long term (current) drug therapy: Secondary | ICD-10-CM | POA: Insufficient documentation

## 2013-12-14 DIAGNOSIS — N184 Chronic kidney disease, stage 4 (severe): Secondary | ICD-10-CM | POA: Insufficient documentation

## 2013-12-14 DIAGNOSIS — M62838 Other muscle spasm: Secondary | ICD-10-CM | POA: Insufficient documentation

## 2013-12-14 DIAGNOSIS — Z8679 Personal history of other diseases of the circulatory system: Secondary | ICD-10-CM

## 2013-12-14 DIAGNOSIS — I2589 Other forms of chronic ischemic heart disease: Secondary | ICD-10-CM | POA: Insufficient documentation

## 2013-12-14 DIAGNOSIS — K219 Gastro-esophageal reflux disease without esophagitis: Secondary | ICD-10-CM | POA: Insufficient documentation

## 2013-12-14 DIAGNOSIS — I10 Essential (primary) hypertension: Secondary | ICD-10-CM | POA: Insufficient documentation

## 2013-12-14 DIAGNOSIS — I252 Old myocardial infarction: Secondary | ICD-10-CM | POA: Insufficient documentation

## 2013-12-14 DIAGNOSIS — I5042 Chronic combined systolic (congestive) and diastolic (congestive) heart failure: Secondary | ICD-10-CM | POA: Insufficient documentation

## 2013-12-14 DIAGNOSIS — R6889 Other general symptoms and signs: Secondary | ICD-10-CM | POA: Insufficient documentation

## 2013-12-14 DIAGNOSIS — I4891 Unspecified atrial fibrillation: Secondary | ICD-10-CM

## 2013-12-14 DIAGNOSIS — Z8673 Personal history of transient ischemic attack (TIA), and cerebral infarction without residual deficits: Secondary | ICD-10-CM | POA: Insufficient documentation

## 2013-12-14 DIAGNOSIS — R079 Chest pain, unspecified: Secondary | ICD-10-CM | POA: Insufficient documentation

## 2013-12-14 DIAGNOSIS — R7989 Other specified abnormal findings of blood chemistry: Secondary | ICD-10-CM

## 2013-12-14 DIAGNOSIS — E039 Hypothyroidism, unspecified: Secondary | ICD-10-CM | POA: Insufficient documentation

## 2013-12-14 LAB — COMPREHENSIVE METABOLIC PANEL
ALT: 13 U/L (ref 0–53)
AST: 23 U/L (ref 0–37)
Albumin: 4 g/dL (ref 3.5–5.2)
Alkaline Phosphatase: 36 U/L — ABNORMAL LOW (ref 39–117)
BUN: 62 mg/dL — ABNORMAL HIGH (ref 6–23)
CALCIUM: 9 mg/dL (ref 8.4–10.5)
CO2: 22 meq/L (ref 19–32)
Chloride: 102 mEq/L (ref 96–112)
Creatinine, Ser: 2.5 mg/dL — ABNORMAL HIGH (ref 0.50–1.35)
GFR calc Af Amer: 24 mL/min — ABNORMAL LOW (ref >90)
GFR calc non Af Amer: 21 mL/min — ABNORMAL LOW (ref >90)
Glucose, Bld: 137 mg/dL — ABNORMAL HIGH (ref 70–99)
POTASSIUM: 4.4 meq/L (ref 3.7–5.3)
SODIUM: 141 meq/L (ref 137–147)
Total Bilirubin: 0.7 mg/dL (ref 0.3–1.2)
Total Protein: 7.5 g/dL (ref 6.0–8.3)

## 2013-12-14 LAB — CBC WITH DIFFERENTIAL/PLATELET
Basophils Absolute: 0 K/uL (ref 0.0–0.1)
Basophils Relative: 0 % (ref 0–1)
Eosinophils Absolute: 0.3 K/uL (ref 0.0–0.7)
Eosinophils Relative: 5 % (ref 0–5)
HCT: 33 % — ABNORMAL LOW (ref 39.0–52.0)
Hemoglobin: 10.7 g/dL — ABNORMAL LOW (ref 13.0–17.0)
Lymphocytes Relative: 23 % (ref 12–46)
Lymphs Abs: 1.4 K/uL (ref 0.7–4.0)
MCH: 30.5 pg (ref 26.0–34.0)
MCHC: 32.4 g/dL (ref 30.0–36.0)
MCV: 94 fL (ref 78.0–100.0)
Monocytes Absolute: 0.5 K/uL (ref 0.1–1.0)
Monocytes Relative: 9 % (ref 3–12)
Neutro Abs: 3.7 K/uL (ref 1.7–7.7)
Neutrophils Relative %: 63 % (ref 43–77)
Platelets: 123 K/uL — ABNORMAL LOW (ref 150–400)
RBC: 3.51 MIL/uL — ABNORMAL LOW (ref 4.22–5.81)
RDW: 15.6 % — ABNORMAL HIGH (ref 11.5–15.5)
WBC: 5.9 K/uL (ref 4.0–10.5)

## 2013-12-14 LAB — I-STAT TROPONIN, ED
Troponin i, poc: 0.02 ng/mL (ref 0.00–0.08)
Troponin i, poc: 0.02 ng/mL (ref 0.00–0.08)

## 2013-12-14 LAB — PRO B NATRIURETIC PEPTIDE: Pro B Natriuretic peptide (BNP): 13420 pg/mL — ABNORMAL HIGH (ref 0–450)

## 2013-12-14 LAB — POCT INR: INR: 2.6

## 2013-12-14 MED ORDER — ASPIRIN 325 MG PO TABS
325.0000 mg | ORAL_TABLET | Freq: Once | ORAL | Status: AC
  Administered 2013-12-14: 325 mg via ORAL
  Filled 2013-12-14: qty 1

## 2013-12-14 NOTE — ED Notes (Signed)
[facility] presents with a 80 yo male from home with chest pain that has resolved since transport to the emergency department by [facility].  Pt had pain across the right scapula without radiation.  Pt states this pain was similar to when he had his last MI.  Pt denies N/V/D, dizziness, lightheadedness and diaphoresis.  Pt states it felt like a quick jolt and then went away.  Pt took 2 NTG and has been pain free since. 12 lead shows pt is in afib with RBBB.  No ASA received.

## 2013-12-14 NOTE — ED Provider Notes (Signed)
CSN: [id number]     Arrival date & time 12/14/13  5621 History   First MD Initiated Contact with Patient 12/14/13 1827     Chief Complaint  Patient presents with  . Chest Pain     (Consider location/radiation/quality/duration/timing/severity/associated sxs/prior Treatment) The history is provided by the patient.  [name] G Qin Sr. is a 80 y.o. male hx of CAD s/p stents, afib on coumadin, HTN, CHF here with chest pain. She went to the doctor's office earlier today for INR check (INR was 2.7). Came back home and had sudden onset of right scapula pain as well as right chest pain. Lasted for several minutes and he took 2 nitroglycerin has been pain-free. Didn't take any aspirin today. Came by EMS. He states that shortness of breath has been stable in his leg swelling has been chronic.    Past Medical History  Diagnosis Date  . Coronary artery disease     a.  s/p MI;  b. s/p CABG 2007;  c. cath 12/08: LM occluded, pRCA 80%, dRCA 50%, PLB 90%, S-PDA ok, S-Dx ok with occluded dist Dx; L-LAD ok, S-OM ok with 90% after graft insertion . . . . distal OM treated with DES  . Ischemic cardiomyopathy     echo 6/12 EF 35-40%, apical, dAnt, mid to dist inf-lat HK, mild AI, mod MR, mod BAE, PASP 44  . Chronic combined systolic and diastolic heart failure     admx 8/13;   . Hypertension   . Atrial fibrillation     a. on coumadin.  . CKD (chronic kidney disease), stage IV   . CVA (cerebral infarction)     left frontal  . Anemia     chronic  . Hypothyroidism   . Leg muscle spasm   . GERD (gastroesophageal reflux disease)   . Skin cancer    Past Surgical History  Procedure Laterality Date  . Coronary artery bypass graft  2007  . Coronary angioplasty with stent placement     Family History  Problem Relation Age of Onset  . Cancer Mother   . Stroke Father    History  Substance Use Topics  . Smoking status: Never Smoker   . Smokeless tobacco: Never Used  . Alcohol Use: No    Review of  Systems  Cardiovascular: Positive for chest pain.  All other systems reviewed and are negative.     Allergies  Ace inhibitors  Home Medications   Prior to Admission medications   Medication Sig Start Date End Date Taking? Authorizing Provider  carvedilol (COREG) 3.125 MG tablet TAKE 1 TABLET BY MOUTH TWICE DAILY 05/31/13  Yes Minus Breeding, MD  Dietary Management Product (VASCULERA) TABS Take 1 capsule by mouth daily. 630mg  06/23/13  Yes Janith Lima, MD  fenofibrate 54 MG tablet TAKE 1 TABLET BY MOUTH ONCE DAILY 12/08/13  Yes Minus Breeding, MD  furosemide (LASIX) 20 MG tablet TAKE 40 MG DAILY 06/17/13  Yes Liliane Shi, PA-C  l-methylfolate-B6-B12 (METANX) 3-35-2 MG TABS TAKE 1 TABLET BY MOUTH TWICE DAILY 10/20/13  Yes Larey Seat, MD  levothyroxine (SYNTHROID, LEVOTHROID) 25 MCG tablet TAKE 1 TABLET BY MOUTH ONCE DAILY BEFORE BREAKFAST 07/05/13  Yes Janith Lima, MD  silodosin (RAPAFLO) 8 MG CAPS capsule Take 1 capsule (8 mg total) by mouth daily with breakfast. 05/06/13  Yes Janith Lima, MD  warfarin (COUMADIN) 2.5 MG tablet Take 1 tablet (2.5 mg total) by mouth as directed. 08/10/13  Yes Minus Breeding, MD  nitroGLYCERIN (NITROSTAT) 0.4 MG SL tablet Place 0.4 mg under the tongue every 5 (five) minutes as needed for chest pain.    Historical Provider, MD   BP 147/54  Pulse 88  Temp(Src) 98.2 F (36.8 C) (Oral)  Resp 17  SpO2 100% Physical Exam  Nursing note and vitals reviewed. Constitutional: He is oriented to person, place, and time.  Chronically ill, NAD   HENT:  Head: Normocephalic.  Mouth/Throat: Oropharynx is clear and moist.  Eyes: Conjunctivae and EOM are normal. Pupils are equal, round, and reactive to light.  Neck: Normal range of motion. Neck supple.  Cardiovascular: Normal rate, regular rhythm and normal heart sounds.   Pulmonary/Chest: Effort normal and breath sounds normal. No respiratory distress. He has no wheezes. He has no rales.  Abdominal:  Soft. Bowel sounds are normal. He exhibits no distension. There is no tenderness. There is no rebound and no guarding.  Musculoskeletal: Normal range of motion.  1+ edema bilaterally   Neurological: He is alert and oriented to person, place, and time. No cranial nerve deficit. Coordination normal.  Skin: Skin is warm and dry.  Psychiatric: He has a normal mood and affect. His behavior is normal. Judgment and thought content normal.    ED Course  Procedures (including critical care time) Labs Review Labs Reviewed  CBC WITH DIFFERENTIAL - Abnormal; Notable for the following:    RBC 3.51 (*)    Hemoglobin 10.7 (*)    HCT 33.0 (*)    RDW 15.6 (*)    Platelets 123 (*)    All other components within normal limits  COMPREHENSIVE METABOLIC PANEL - Abnormal; Notable for the following:    Glucose, Bld 137 (*)    BUN 62 (*)    Creatinine, Ser 2.50 (*)    Alkaline Phosphatase 36 (*)    GFR calc non Af Amer 21 (*)    GFR calc Af Amer 24 (*)    All other components within normal limits  PRO B NATRIURETIC PEPTIDE - Abnormal; Notable for the following:    Pro B Natriuretic peptide (BNP) 13420.0 (*)    All other components within normal limits  I-STAT TROPOININ, ED  Randolm Idol, ED    Imaging Review Dg Chest 2 View  12/14/2013   CLINICAL DATA:  Chest pain  EXAM: CHEST  2 VIEW  COMPARISON:  01/09/2013  FINDINGS: Cardiomediastinal silhouette is stable. Status post median sternotomy. No acute infiltrate or pleural effusion. Osteopenia and degenerative changes thoracic spine.  IMPRESSION: No active disease.  Status post CABG.   Electronically Signed   By: Lahoma Crocker M.D.   On: 12/14/2013 21:08     EKG Interpretation None       Date: 12/14/2013  Rate: 85  Rhythm: atrial fibrillation  QRS Axis: normal  Intervals: normal  ST/T Wave abnormalities: nonspecific ST changes  Conduction Disutrbances:right bundle branch block and left posterior fascicular block  Narrative Interpretation:    Old EKG Reviewed: unchanged    MDM   Final diagnoses:  None    [name] G Senegal Sr. is a 80 y.o. male hx of CAD, afib on coumadin here with R sided back pain, chest pain. States that it was similar to previous stents but pain free now. Given patient's age, I don't think we need to be overly aggressive. Will get trop x 2. If pain recur or trop elevated, then will admit and observe.   11:03 PM Pain free for the last 4 hours in the ED.  Trop neg x 2. BNP slightly elevated compared to baseline but CXR showed no obvious CHF. I think it might be increased BNP due to increasing in age vs mild CHF exacerbation. Will inc lasix to 60 mg for 2 days then back down to 40 mg. I told family that he needs to call his cardiologist to arrange for possible echo and follow up.    Wandra Arthurs, MD 12/14/13 2306

## 2013-12-14 NOTE — Discharge Instructions (Signed)
Increase lasix to 60 mg daily for the next 2 days then back down to 40 mg daily.   Your BNP is slightly elevated. Call your cardiologist to arrange for follow up and possible echo.   Return to ER if you have chest pain, shortness of breath, increased leg swelling.

## 2013-12-15 ENCOUNTER — Telehealth: Payer: Self-pay | Admitting: Cardiology

## 2013-12-15 NOTE — Telephone Encounter (Signed)
New message     Went to ER last night.  R/o heart attack.  His BNP was elevated.  He was instructed to call his cardiologist this am for further instructions.  Please call back asap.

## 2013-12-15 NOTE — Telephone Encounter (Signed)
Will forward to MD to address

## 2013-12-15 NOTE — Telephone Encounter (Signed)
Spoke with Margarita Grizzle - she is aware Dr Percival Spanish is in clinic and will review information as soon as possible.  Pt is to take 60 mg of Furosemide for 2 days then return to regular dosing.  Aware I will call back with any further instructions once MD has reviewed.

## 2013-12-15 NOTE — Telephone Encounter (Signed)
Left message for Margarita Grizzle to continue medications as instruction and call back with further questions

## 2013-12-15 NOTE — Telephone Encounter (Signed)
Agree with above 

## 2013-12-15 NOTE — Telephone Encounter (Signed)
Daughter wants a call back ASAP! Suan Halter ( daughter) [phone]

## 2013-12-28 ENCOUNTER — Other Ambulatory Visit: Payer: Self-pay | Admitting: *Deleted

## 2013-12-28 MED ORDER — WARFARIN SODIUM 2.5 MG PO TABS: ORAL_TABLET | ORAL | Status: DC

## 2014-01-05 ENCOUNTER — Other Ambulatory Visit: Payer: Self-pay

## 2014-01-05 MED ORDER — CARVEDILOL 3.125 MG PO TABS: ORAL_TABLET | ORAL | Status: DC

## 2014-01-06 ENCOUNTER — Other Ambulatory Visit: Payer: Self-pay | Admitting: Internal Medicine

## 2014-01-11 ENCOUNTER — Ambulatory Visit: Payer: Self-pay | Admitting: Podiatry

## 2014-01-11 ENCOUNTER — Ambulatory Visit (INDEPENDENT_AMBULATORY_CARE_PROVIDER_SITE_OTHER): Payer: Medicare Other | Admitting: *Deleted

## 2014-01-11 DIAGNOSIS — Z7901 Long term (current) use of anticoagulants: Secondary | ICD-10-CM

## 2014-01-11 DIAGNOSIS — I4891 Unspecified atrial fibrillation: Secondary | ICD-10-CM

## 2014-01-11 DIAGNOSIS — Z8679 Personal history of other diseases of the circulatory system: Secondary | ICD-10-CM

## 2014-01-11 LAB — POCT INR: INR: 2.4

## 2014-01-12 ENCOUNTER — Emergency Department (HOSPITAL_COMMUNITY)
Admission: EM | Admit: 2014-01-12 | Discharge: 2014-01-12 | Disposition: A | Discharge status: Home / Self Care | Payer: Medicare Other | Attending: Emergency Medicine | Admitting: Emergency Medicine

## 2014-01-12 ENCOUNTER — Encounter (HOSPITAL_COMMUNITY): Payer: Self-pay | Admitting: Emergency Medicine

## 2014-01-12 ENCOUNTER — Emergency Department (HOSPITAL_COMMUNITY): Payer: Medicare Other

## 2014-01-12 DIAGNOSIS — Z79899 Other long term (current) drug therapy: Secondary | ICD-10-CM | POA: Insufficient documentation

## 2014-01-12 DIAGNOSIS — Z8719 Personal history of other diseases of the digestive system: Secondary | ICD-10-CM | POA: Insufficient documentation

## 2014-01-12 DIAGNOSIS — R51 Headache: Secondary | ICD-10-CM | POA: Insufficient documentation

## 2014-01-12 DIAGNOSIS — E039 Hypothyroidism, unspecified: Secondary | ICD-10-CM | POA: Insufficient documentation

## 2014-01-12 DIAGNOSIS — N184 Chronic kidney disease, stage 4 (severe): Secondary | ICD-10-CM | POA: Insufficient documentation

## 2014-01-12 DIAGNOSIS — Z8673 Personal history of transient ischemic attack (TIA), and cerebral infarction without residual deficits: Secondary | ICD-10-CM | POA: Insufficient documentation

## 2014-01-12 DIAGNOSIS — I5042 Chronic combined systolic (congestive) and diastolic (congestive) heart failure: Secondary | ICD-10-CM | POA: Insufficient documentation

## 2014-01-12 DIAGNOSIS — Z85828 Personal history of other malignant neoplasm of skin: Secondary | ICD-10-CM | POA: Insufficient documentation

## 2014-01-12 DIAGNOSIS — R519 Headache, unspecified: Secondary | ICD-10-CM

## 2014-01-12 DIAGNOSIS — Z951 Presence of aortocoronary bypass graft: Secondary | ICD-10-CM | POA: Insufficient documentation

## 2014-01-12 DIAGNOSIS — G5790 Unspecified mononeuropathy of unspecified lower limb: Secondary | ICD-10-CM | POA: Insufficient documentation

## 2014-01-12 DIAGNOSIS — I129 Hypertensive chronic kidney disease with stage 1 through stage 4 chronic kidney disease, or unspecified chronic kidney disease: Secondary | ICD-10-CM | POA: Insufficient documentation

## 2014-01-12 DIAGNOSIS — I4891 Unspecified atrial fibrillation: Secondary | ICD-10-CM | POA: Insufficient documentation

## 2014-01-12 DIAGNOSIS — R61 Generalized hyperhidrosis: Secondary | ICD-10-CM | POA: Insufficient documentation

## 2014-01-12 DIAGNOSIS — I2510 Atherosclerotic heart disease of native coronary artery without angina pectoris: Secondary | ICD-10-CM | POA: Insufficient documentation

## 2014-01-12 DIAGNOSIS — Z9861 Coronary angioplasty status: Secondary | ICD-10-CM | POA: Insufficient documentation

## 2014-01-12 LAB — BASIC METABOLIC PANEL
BUN: 65 mg/dL — ABNORMAL HIGH (ref 6–23)
CO2: 24 mEq/L (ref 19–32)
Calcium: 8.8 mg/dL (ref 8.4–10.5)
Chloride: 106 mEq/L (ref 96–112)
Creatinine, Ser: 2.49 mg/dL — ABNORMAL HIGH (ref 0.50–1.35)
GFR calc Af Amer: 24 mL/min — ABNORMAL LOW (ref >90)
GFR calc non Af Amer: 21 mL/min — ABNORMAL LOW (ref >90)
Glucose, Bld: 96 mg/dL (ref 70–99)
Potassium: 4.2 mEq/L (ref 3.7–5.3)
Sodium: 143 mEq/L (ref 137–147)

## 2014-01-12 LAB — URINALYSIS, ROUTINE W REFLEX MICROSCOPIC
Bilirubin Urine: NEGATIVE
Glucose, UA: NEGATIVE mg/dL
Ketones, ur: NEGATIVE mg/dL
Leukocytes, UA: NEGATIVE
Nitrite: NEGATIVE
Protein, ur: NEGATIVE mg/dL
Specific Gravity, Urine: 1.014 (ref 1.005–1.030)
Urobilinogen, UA: 0.2 mg/dL (ref 0.0–1.0)
pH: 5.5 (ref 5.0–8.0)

## 2014-01-12 LAB — URINE MICROSCOPIC-ADD ON

## 2014-01-12 LAB — CBC WITH DIFFERENTIAL/PLATELET
Basophils Absolute: 0 10*3/uL (ref 0.0–0.1)
Basophils Relative: 0 % (ref 0–1)
Eosinophils Absolute: 0.3 10*3/uL (ref 0.0–0.7)
Eosinophils Relative: 5 % (ref 0–5)
HCT: 29.6 % — ABNORMAL LOW (ref 39.0–52.0)
Hemoglobin: 9.7 g/dL — ABNORMAL LOW (ref 13.0–17.0)
Lymphocytes Relative: 27 % (ref 12–46)
Lymphs Abs: 1.3 10*3/uL (ref 0.7–4.0)
MCH: 30.5 pg (ref 26.0–34.0)
MCHC: 32.8 g/dL (ref 30.0–36.0)
MCV: 93.1 fL (ref 78.0–100.0)
Monocytes Absolute: 0.5 10*3/uL (ref 0.1–1.0)
Monocytes Relative: 10 % (ref 3–12)
Neutro Abs: 2.8 10*3/uL (ref 1.7–7.7)
Neutrophils Relative %: 58 % (ref 43–77)
Platelets: 100 10*3/uL — ABNORMAL LOW (ref 150–400)
RBC: 3.18 MIL/uL — ABNORMAL LOW (ref 4.22–5.81)
RDW: 15.5 % (ref 11.5–15.5)
WBC: 4.9 10*3/uL (ref 4.0–10.5)

## 2014-01-12 LAB — TROPONIN I: Troponin I: <0.3 ng/mL (ref <0.30)

## 2014-01-12 LAB — PROTIME-INR
INR: 2.96 — ABNORMAL HIGH (ref 0.00–1.49)
Prothrombin Time: 29.8 seconds — ABNORMAL HIGH (ref 11.6–15.2)

## 2014-01-12 NOTE — ED Notes (Signed)
Pt from home with c/o a dull headache starting over last night, but is decreasing.  Pt also reports increased bilateral leg neuropathy pain and an episode of profuse sweating.  Pt in NAD, A&O.

## 2014-01-12 NOTE — Discharge Instructions (Signed)
Diaphoresis  Sweating is controlled by our nervous system. Sweat glands are found in the skin throughout the body. They exist in higher numbers in the skin of the hands, feet, armpits, and the genital region. Sweating occurs normally when the temperature of your body goes up. Diaphoresis means profuse sweating or perspiration due to an underlying medical condition or an external factor (such as medicines). Hyperhidrosis means excessive sweating that is not usually due to an underlying medical condition, on areas such as the palms, soles, or armpits. Other areas of the body may also be affected. Hyperhidrosis usually begins in childhood or early adolescence. It increases in severity through puberty and into adulthood. Sweaty palms are the most common problem and the most bothersome to people who have hyperhidrosis.  CAUSES   Sweating is normally seen with exercise or being in hot surroundings. Not sweating in these conditions would be harmful. Stressful situations can also cause sweating. In some people, stimulation of the sweat glands during stress is overactive. Talking to strangers or shaking someone's hand can produce profuse sweating. Causes of sweating include:  · Emotional upset.  · Low blood pressure.  · Low blood sugar.  · Heart problems.  · Low blood cell counts.  · Certain pain relieving medicines.  · Exercise.  · Alcohol.  · Infection.  · Caffeine.  · Spicy foods.  · Hot flashes.  · Overactive thyroid.  · Illegal drug use, such as cocaine and amphetamine.  · Use of medicines that stimulate parts of the nervous system.  · A tumor (pheochromocytoma).  · Withdrawal from some medicines or alcohol.  DIAGNOSIS   Your caregiver needs to be consulted to make sure excessive sweating is not caused by another condition. Further testing may need to be done.  TREATMENT   · When hyperhidrosis is caused by another condition, that condition should be treated.  · If menopause is the cause, you may wish to talk to your  caregiver about estrogen replacement.  · If the hyperhidrosis is a natural happening of the way your body works, certain antiperspirants may help.  · If medicines do not work, injections of botulinum toxin type A are sometimes used for underarm sweating.  · Your caregiver can usually help you with this problem.  Document Released: 03/05/2005 Document Revised: 11/05/2011 Document Reviewed: 09/20/2008  Exit[employer]® Patient Information ©2014 [employer].

## 2014-01-15 ENCOUNTER — Encounter: Payer: Self-pay | Admitting: Podiatry

## 2014-01-15 ENCOUNTER — Ambulatory Visit (INDEPENDENT_AMBULATORY_CARE_PROVIDER_SITE_OTHER): Payer: Medicare Other | Admitting: Podiatry

## 2014-01-15 VITALS — BP 115/64 | HR 65 | Temp 96.2°F | Resp 18

## 2014-01-15 DIAGNOSIS — M79609 Pain in unspecified limb: Secondary | ICD-10-CM

## 2014-01-15 DIAGNOSIS — L03039 Cellulitis of unspecified toe: Secondary | ICD-10-CM

## 2014-01-15 DIAGNOSIS — B351 Tinea unguium: Secondary | ICD-10-CM

## 2014-01-15 NOTE — Patient Instructions (Signed)
Apply topical antibiotic ointment daily to the left great toe daily, cover with Band-Aid until healed Reappoint at three-month intervals for debridement of mycotic toenails

## 2014-01-15 NOTE — Progress Notes (Signed)
   Subjective:    Patient ID: [name] Amos Sr., male    DOB: 1920-01-06, 80 y.o.   MRN: [id number]  HPI Daughter states that he told me about the toenail is coming loose and has been going on for about a week now and I do have neuropathy and I don't remember doing anything to it and the nails are over grown and thick and discolored  This patient presents with his daughter who describes a loosening in the hallux toenail with some complaining of bleeding around the site. Patient  daughter are unaware of when the event actually occurred.  Patient has had previous nail debridement in 2014 by Dr. Rolley Sims.  Review of Systems  Constitutional:       Night sweat on monday  Cardiovascular:       A FIB  Hematological: Bruises/bleeds easily.  All other systems reviewed and are negative.      Objective:   Physical Exam Pleasant 80 year old white male presents with his daughter. He is somewhat confused about the history of the chief complaint. He is able to understand and respond to direct questions.  Vascular: DP and PT pulses 1/4 bilaterally  Neurological: Sensation to 10 g monofilament wire intact 3/5 right and 0/5 left Vibratory sensation nonreactive bilaterally Ankle reflexes reactive bilaterally  Dermatological: All the toenails x10 are elongated, brittle, discolored and hypertrophic. The left hallux nail distal two thirds is detached from the underlying nailbed with evidence of dried blood surrounding the distal toe. There is no active drainage, malodor, edema or erythema noted about the left hallux nail  Musculoskeletal: Medial longitudinal arch noted bilaterally Patient has unstable gait pattern     Assessment & Plan:   Assessment: Peripheral neuropathy bilaterally Gait disturbance Partial traumatic nail avulsion left hallux with low-grade paronychia Neglected onychomycoses x10  Plan: The left hallux nail was debrided to the point where was attached at the proximal one  third of the nailbed. The underlying nailbed appears to be dry. The remaining 9 nails were debrided without any bleeding Patient and daughter advised to apply topical antibiotic ointment to the left hallux nail daily until healed.  Reappoint at three-month intervals

## 2014-01-16 NOTE — ED Provider Notes (Signed)
CSN: [id number]     Arrival date & time 01/12/14  4098 History   First MD Initiated Contact with Patient 01/12/14 0725     Chief Complaint  Patient presents with  . Headache     (Consider location/radiation/quality/duration/timing/severity/associated sxs/prior Treatment) HPI  80 year old male presenting with a headache which has since resolved. She also woke up in the middle of night sweating. He went back to sleep a little bit better this morning. Is complaining of "more exaggerated neuropathy in his legs" otherwise no acute pain complaints aside from the aforementioned headache. When he spoke with his daughter this morning she was concerned and brought to the emergency room for evaluation. He denies having any chest pain or breathing difficulties with this episode of sweating or currently. No nausea. He went to bed in his usual state of health.  Past Medical History  Diagnosis Date  . Coronary artery disease     a.  s/p MI;  b. s/p CABG 2007;  c. cath 12/08: LM occluded, pRCA 80%, dRCA 50%, PLB 90%, S-PDA ok, S-Dx ok with occluded dist Dx; L-LAD ok, S-OM ok with 90% after graft insertion . . . . distal OM treated with DES  . Ischemic cardiomyopathy     echo 6/12 EF 35-40%, apical, dAnt, mid to dist inf-lat HK, mild AI, mod MR, mod BAE, PASP 44  . Chronic combined systolic and diastolic heart failure     admx 8/13;   . Hypertension   . Atrial fibrillation     a. on coumadin.  . CKD (chronic kidney disease), stage IV   . CVA (cerebral infarction)     left frontal  . Anemia     chronic  . Hypothyroidism   . Leg muscle spasm   . GERD (gastroesophageal reflux disease)   . Skin cancer    Past Surgical History  Procedure Laterality Date  . Coronary artery bypass graft  2007  . Coronary angioplasty with stent placement     Family History  Problem Relation Age of Onset  . Cancer Mother   . Stroke Father    History  Substance Use Topics  . Smoking status: Never Smoker   .  Smokeless tobacco: Never Used  . Alcohol Use: No    Review of Systems  All systems reviewed and negative, other than as noted in HPI.   Allergies  Ace inhibitors  Home Medications   Prior to Admission medications   Medication Sig Start Date End Date Taking? Authorizing Provider  carvedilol (COREG) 3.125 MG tablet Take 3.125 mg by mouth 2 (two) times daily with a meal.   Yes Historical Provider, MD  Dietary Management Product (VASCULERA) TABS Take 1 capsule by mouth daily. 630mg  06/23/13  Yes Janith Lima, MD  fenofibrate 54 MG tablet Take 54 mg by mouth daily.   Yes Historical Provider, MD  furosemide (LASIX) 20 MG tablet Take 40 mg by mouth daily.   Yes Historical Provider, MD  l-methylfolate-B6-B12 (METANX) 3-35-2 MG TABS TAKE 1 TABLET BY MOUTH TWICE DAILY 10/20/13  Yes Larey Seat, MD  levothyroxine (SYNTHROID, LEVOTHROID) 25 MCG tablet Take 25 mcg by mouth daily before breakfast.   Yes Historical Provider, MD  nitroGLYCERIN (NITROSTAT) 0.4 MG SL tablet Place 0.4 mg under the tongue every 5 (five) minutes as needed for chest pain.   Yes Historical Provider, MD  silodosin (RAPAFLO) 8 MG CAPS capsule Take 1 capsule (8 mg total) by mouth daily with breakfast. 05/06/13  Yes Marcello Moores  Evalina Field, MD  warfarin (COUMADIN) 2.5 MG tablet 1 tablet daily except 1.5 tablets only on Mondays and Thursdays or as directed by coumadin clinic 12/28/13  Yes Minus Breeding, MD   BP 119/54  Pulse 72  Temp(Src) 97.2 F (36.2 C) (Oral)  Resp 22  SpO2 100% Physical Exam  Nursing note and vitals reviewed. Constitutional: He appears well-developed and well-nourished. No distress.  HENT:  Head: Normocephalic and atraumatic.  Eyes: Conjunctivae are normal. Right eye exhibits no discharge. Left eye exhibits no discharge.  Neck: Neck supple.  Cardiovascular: Normal rate and normal heart sounds.  Exam reveals no gallop and no friction rub.   No murmur heard. irreg irreg  Pulmonary/Chest: Effort normal and  breath sounds normal. No respiratory distress.  Abdominal: Soft. He exhibits no distension. There is no tenderness.  Musculoskeletal: He exhibits no edema and no tenderness.  Neurological: He is alert.  HoH, otherwise CN intact. Speech clear. Content appropriate. Making some jokes. Strength 5/5 b/l u/l extremities.   Skin: Skin is warm and dry.  Psychiatric: He has a normal mood and affect. His behavior is normal. Thought content normal.    ED Course  Procedures (including critical care time) Labs Review Labs Reviewed  CBC WITH DIFFERENTIAL - Abnormal; Notable for the following:    RBC 3.18 (*)    Hemoglobin 9.7 (*)    HCT 29.6 (*)    Platelets 100 (*)    All other components within normal limits  BASIC METABOLIC PANEL - Abnormal; Notable for the following:    BUN 65 (*)    Creatinine, Ser 2.49 (*)    GFR calc non Af Amer 21 (*)    GFR calc Af Amer 24 (*)    All other components within normal limits  PROTIME-INR - Abnormal; Notable for the following:    Prothrombin Time 29.8 (*)    INR 2.96 (*)    All other components within normal limits  URINALYSIS, ROUTINE W REFLEX MICROSCOPIC - Abnormal; Notable for the following:    Hgb urine dipstick TRACE (*)    All other components within normal limits  URINE MICROSCOPIC-ADD ON - Abnormal; Notable for the following:    Casts HYALINE CASTS (*)    All other components within normal limits  TROPONIN I    Imaging Review No results found.   EKG Interpretation   Date/Time:  Tuesday Jan 12 2014 07:33:05 EDT Ventricular Rate:  68 PR Interval:    QRS Duration: 151 QT Interval:  460 QTC Calculation: 489 R Axis:   93 Text Interpretation:  Age not entered, assumed to be  80 years old for  purpose of ECG interpretation Atrial fibrillation RBBB and LPFB ED  PHYSICIAN INTERPRETATION AVAILABLE IN [facility] Confirmed by TEST,  Record ([id number]) on 01/14/2014 8:10:47 AM      MDM   Final diagnoses:  Night sweat  Headache     89yM with sweating last night and mild HA which has since resolved. Etiology not clear. Afebrile. Seems atypical for ACS. W/u fairly unremarkable. Anemia/thrombocytopenia/renal impairment chronic and at/near baseline. Low suspicion for emergent process.     Virgel Manifold, MD 01/16/14 [phone]

## 2014-01-22 ENCOUNTER — Encounter: Payer: Self-pay | Admitting: Cardiology

## 2014-01-25 ENCOUNTER — Ambulatory Visit: Payer: Self-pay | Admitting: Podiatry

## 2014-02-08 ENCOUNTER — Telehealth: Payer: Self-pay | Admitting: Cardiology

## 2014-02-08 ENCOUNTER — Ambulatory Visit: Payer: Medicare Other | Admitting: Cardiology

## 2014-02-09 ENCOUNTER — Ambulatory Visit: Payer: Medicare Other | Admitting: Cardiology

## 2014-02-12 ENCOUNTER — Ambulatory Visit (INDEPENDENT_AMBULATORY_CARE_PROVIDER_SITE_OTHER): Payer: Medicare Other | Admitting: Cardiology

## 2014-02-12 ENCOUNTER — Encounter: Payer: Self-pay | Admitting: Cardiology

## 2014-02-12 ENCOUNTER — Ambulatory Visit (INDEPENDENT_AMBULATORY_CARE_PROVIDER_SITE_OTHER): Payer: Medicare Other | Admitting: Pharmacist

## 2014-02-12 VITALS — BP 111/54 | HR 72 | Ht 71.0 in | Wt 160.0 lb

## 2014-02-12 DIAGNOSIS — Z8679 Personal history of other diseases of the circulatory system: Secondary | ICD-10-CM

## 2014-02-12 DIAGNOSIS — Z7901 Long term (current) use of anticoagulants: Secondary | ICD-10-CM

## 2014-02-12 DIAGNOSIS — I4891 Unspecified atrial fibrillation: Secondary | ICD-10-CM

## 2014-02-12 DIAGNOSIS — I2510 Atherosclerotic heart disease of native coronary artery without angina pectoris: Secondary | ICD-10-CM

## 2014-02-12 LAB — POCT INR: INR: 1.8

## 2014-02-12 NOTE — Patient Instructions (Signed)
The current medical regimen is effective;  continue present plan and medications.  Follow up in 6 months with Dr Hochrein.  You will receive a letter in the mail 2 months before you are due.  Please call us when you receive this letter to schedule your follow up appointment.  

## 2014-02-12 NOTE — Progress Notes (Signed)
HPI The patient presents for followup of his cardiomyopathy. Since I last saw him he has been in the ED a couple of times.  I reviewed these records.  He had one episode of back pain and another of diaphoresis. However, these were not felt to be cardiac. No cardiac workup is suggested. I did review labs and they were stable with some stable renal insufficiency. He since denies any cardiovascular complaints. He is having some sciatic trouble but he is otherwise doing well. He denies any chest pressure, neck or arm discomfort. He has had no new shortness of breath, PND or orthopnea. He has had no weight gain or edema.   Allergies  Allergen Reactions  . Ace Inhibitors     Cannot remember the reaction    Current Outpatient Prescriptions on File Prior to Visit  Medication Sig Dispense Refill  . carvedilol (COREG) 3.125 MG tablet Take 3.125 mg by mouth 2 (two) times daily with a meal.      . Dietary Management Product (VASCULERA) TABS Take 1 capsule by mouth daily. 630mg       . fenofibrate 54 MG tablet Take 54 mg by mouth daily.      . furosemide (LASIX) 20 MG tablet Take 40 mg by mouth daily.      Marland Kitchen l-methylfolate-B6-B12 (METANX) 3-35-2 MG TABS TAKE 1 TABLET BY MOUTH TWICE DAILY  60 tablet  5  . levothyroxine (SYNTHROID, LEVOTHROID) 25 MCG tablet Take 25 mcg by mouth daily before breakfast.      . nitroGLYCERIN (NITROSTAT) 0.4 MG SL tablet Place 0.4 mg under the tongue every 5 (five) minutes as needed for chest pain.      . silodosin (RAPAFLO) 8 MG CAPS capsule Take 1 capsule (8 mg total) by mouth daily with breakfast.  90 capsule  3  . warfarin (COUMADIN) 2.5 MG tablet 1 tablet daily except 1.5 tablets only on Mondays and Thursdays or as directed by coumadin clinic  45 tablet  3   No current facility-administered medications on file prior to visit.    Past Medical History  Diagnosis Date  . Coronary artery disease     a.  s/p MI;  b. s/p CABG 2007;  c. cath 12/08: LM occluded, pRCA 80%,  dRCA 50%, PLB 90%, S-PDA ok, S-Dx ok with occluded dist Dx; L-LAD ok, S-OM ok with 90% after graft insertion . . . . distal OM treated with DES  . Ischemic cardiomyopathy     echo 6/12 EF 35-40%, apical, dAnt, mid to dist inf-lat HK, mild AI, mod MR, mod BAE, PASP 44  . Chronic combined systolic and diastolic heart failure     admx 8/13;   . Hypertension   . Atrial fibrillation     a. on coumadin.  . CKD (chronic kidney disease), stage IV   . CVA (cerebral infarction)     left frontal  . Anemia     chronic  . Hypothyroidism   . Leg muscle spasm   . GERD (gastroesophageal reflux disease)   . Skin cancer     Past Surgical History  Procedure Laterality Date  . Coronary artery bypass graft  2007  . Coronary angioplasty with stent placement      ROS  As stated in the HPI and negative for all other systems.  PHYSICAL EXAM:   BP 111/54  Pulse 72  Ht 5\' 11"  (1.803 m)  Wt 160 lb (72.576 kg)  BMI 22.33 kg/m2 GENERAL:  Frail-appearing NECK:  JVD at 45 degrees to the jaw at 45 degrees, waveform within normal limits, carotid upstroke brisk and symmetric, no bruits, no thyromegaly LUNGS:  Clear to auscultation bilaterally BACK:  No CVA tenderness CHEST:  Healed sternotomy scar HEART:  PMI not displaced or sustained,,S1 and S2 within normal limits, no S3, no clicks, no rubs, no murmurs, irregluar ABD:  Flat, positive bowel sounds normal in frequency in pitch, no bruits, no rebound, no guarding, no midline pulsatile mass, no hepatomegaly, no splenomegaly EXT:  2 plus pulses throughout, mild edema, no cyanosis no clubbing, mild edema   ASSESSMENT:  ISCHEMIC CARDIOMYOPATHY -  He seems to have be euvolemic. At this point he will continue meds as listed. I  CAD -  The patient has no new sypmtoms. No further cardiovascular testing is indicated. We will continue with aggressive risk reduction and meds as listed.   Atrial fibrillation -  The patient tolerates this rhythm with rate  control and anticoagulation.  His stroke risk is 9% per year.  He will continue with warfarin.

## 2014-03-03 ENCOUNTER — Ambulatory Visit: Payer: Self-pay | Admitting: Neurology

## 2014-03-06 ENCOUNTER — Inpatient Hospital Stay (HOSPITAL_COMMUNITY)
Admission: EM | Admit: 2014-03-06 | Discharge: 2014-03-08 | DRG: 194 | Disposition: A | Discharge status: Home Health | Payer: Medicare Other | Attending: Family Medicine | Admitting: Family Medicine

## 2014-03-06 ENCOUNTER — Emergency Department (HOSPITAL_COMMUNITY): Payer: Medicare Other

## 2014-03-06 ENCOUNTER — Encounter (HOSPITAL_COMMUNITY): Payer: Self-pay | Admitting: Emergency Medicine

## 2014-03-06 DIAGNOSIS — I129 Hypertensive chronic kidney disease with stage 1 through stage 4 chronic kidney disease, or unspecified chronic kidney disease: Secondary | ICD-10-CM | POA: Diagnosis present

## 2014-03-06 DIAGNOSIS — T448X5A Adverse effect of centrally-acting and adrenergic-neuron-blocking agents, initial encounter: Secondary | ICD-10-CM | POA: Diagnosis present

## 2014-03-06 DIAGNOSIS — Z951 Presence of aortocoronary bypass graft: Secondary | ICD-10-CM

## 2014-03-06 DIAGNOSIS — Z8673 Personal history of transient ischemic attack (TIA), and cerebral infarction without residual deficits: Secondary | ICD-10-CM

## 2014-03-06 DIAGNOSIS — Z823 Family history of stroke: Secondary | ICD-10-CM | POA: Diagnosis not present

## 2014-03-06 DIAGNOSIS — Z85828 Personal history of other malignant neoplasm of skin: Secondary | ICD-10-CM

## 2014-03-06 DIAGNOSIS — R4182 Altered mental status, unspecified: Secondary | ICD-10-CM | POA: Diagnosis present

## 2014-03-06 DIAGNOSIS — K219 Gastro-esophageal reflux disease without esophagitis: Secondary | ICD-10-CM | POA: Diagnosis present

## 2014-03-06 DIAGNOSIS — E039 Hypothyroidism, unspecified: Secondary | ICD-10-CM | POA: Diagnosis present

## 2014-03-06 DIAGNOSIS — Z9861 Coronary angioplasty status: Secondary | ICD-10-CM

## 2014-03-06 DIAGNOSIS — G4730 Sleep apnea, unspecified: Secondary | ICD-10-CM

## 2014-03-06 DIAGNOSIS — I2510 Atherosclerotic heart disease of native coronary artery without angina pectoris: Secondary | ICD-10-CM | POA: Diagnosis present

## 2014-03-06 DIAGNOSIS — I10 Essential (primary) hypertension: Secondary | ICD-10-CM

## 2014-03-06 DIAGNOSIS — N179 Acute kidney failure, unspecified: Secondary | ICD-10-CM | POA: Diagnosis present

## 2014-03-06 DIAGNOSIS — I5042 Chronic combined systolic (congestive) and diastolic (congestive) heart failure: Secondary | ICD-10-CM | POA: Diagnosis present

## 2014-03-06 DIAGNOSIS — D539 Nutritional anemia, unspecified: Secondary | ICD-10-CM

## 2014-03-06 DIAGNOSIS — I2589 Other forms of chronic ischemic heart disease: Secondary | ICD-10-CM | POA: Diagnosis present

## 2014-03-06 DIAGNOSIS — T502X5A Adverse effect of carbonic-anhydrase inhibitors, benzothiadiazides and other diuretics, initial encounter: Secondary | ICD-10-CM | POA: Diagnosis present

## 2014-03-06 DIAGNOSIS — I4891 Unspecified atrial fibrillation: Secondary | ICD-10-CM | POA: Diagnosis present

## 2014-03-06 DIAGNOSIS — I951 Orthostatic hypotension: Secondary | ICD-10-CM | POA: Diagnosis present

## 2014-03-06 DIAGNOSIS — I252 Old myocardial infarction: Secondary | ICD-10-CM | POA: Diagnosis not present

## 2014-03-06 DIAGNOSIS — Z7901 Long term (current) use of anticoagulants: Secondary | ICD-10-CM

## 2014-03-06 DIAGNOSIS — D649 Anemia, unspecified: Secondary | ICD-10-CM | POA: Diagnosis present

## 2014-03-06 DIAGNOSIS — I480 Paroxysmal atrial fibrillation: Secondary | ICD-10-CM

## 2014-03-06 DIAGNOSIS — Z888 Allergy status to other drugs, medicaments and biological substances status: Secondary | ICD-10-CM | POA: Diagnosis not present

## 2014-03-06 DIAGNOSIS — J189 Pneumonia, unspecified organism: Secondary | ICD-10-CM | POA: Diagnosis not present

## 2014-03-06 DIAGNOSIS — N184 Chronic kidney disease, stage 4 (severe): Secondary | ICD-10-CM

## 2014-03-06 DIAGNOSIS — R269 Unspecified abnormalities of gait and mobility: Secondary | ICD-10-CM

## 2014-03-06 DIAGNOSIS — D696 Thrombocytopenia, unspecified: Secondary | ICD-10-CM | POA: Diagnosis present

## 2014-03-06 DIAGNOSIS — I959 Hypotension, unspecified: Secondary | ICD-10-CM

## 2014-03-06 LAB — COMPREHENSIVE METABOLIC PANEL
ALT: 11 U/L (ref 0–53)
AST: 18 U/L (ref 0–37)
Albumin: 3.8 g/dL (ref 3.5–5.2)
Alkaline Phosphatase: 38 U/L — ABNORMAL LOW (ref 39–117)
Anion gap: 14 (ref 5–15)
BUN: 64 mg/dL — ABNORMAL HIGH (ref 6–23)
CALCIUM: 8.7 mg/dL (ref 8.4–10.5)
CO2: 25 mEq/L (ref 19–32)
CREATININE: 2.44 mg/dL — AB (ref 0.50–1.35)
Chloride: 105 mEq/L (ref 96–112)
GFR calc Af Amer: 25 mL/min — ABNORMAL LOW (ref >90)
GFR calc non Af Amer: 21 mL/min — ABNORMAL LOW (ref >90)
GLUCOSE: 165 mg/dL — AB (ref 70–99)
Potassium: 4.2 mEq/L (ref 3.7–5.3)
SODIUM: 144 meq/L (ref 137–147)
TOTAL PROTEIN: 6.9 g/dL (ref 6.0–8.3)
Total Bilirubin: 0.9 mg/dL (ref 0.3–1.2)

## 2014-03-06 LAB — CBC
HCT: 31.5 % — ABNORMAL LOW (ref 39.0–52.0)
HEMOGLOBIN: 10.1 g/dL — AB (ref 13.0–17.0)
MCH: 30.1 pg (ref 26.0–34.0)
MCHC: 32.1 g/dL (ref 30.0–36.0)
MCV: 93.8 fL (ref 78.0–100.0)
PLATELETS: 99 10*3/uL — AB (ref 150–400)
RBC: 3.36 MIL/uL — ABNORMAL LOW (ref 4.22–5.81)
RDW: 14.8 % (ref 11.5–15.5)
WBC: 10.4 10*3/uL (ref 4.0–10.5)

## 2014-03-06 LAB — PROCALCITONIN: Procalcitonin: <0.1 ng/mL

## 2014-03-06 LAB — PRO B NATRIURETIC PEPTIDE: Pro B Natriuretic peptide (BNP): 15268 pg/mL — ABNORMAL HIGH (ref 0–450)

## 2014-03-06 LAB — I-STAT TROPONIN, ED: Troponin i, poc: 0.03 ng/mL (ref 0.00–0.08)

## 2014-03-06 LAB — CBG MONITORING, ED: Glucose-Capillary: 158 mg/dL — ABNORMAL HIGH (ref 70–99)

## 2014-03-06 LAB — URINALYSIS, ROUTINE W REFLEX MICROSCOPIC
Bilirubin Urine: NEGATIVE
Glucose, UA: NEGATIVE mg/dL
Ketones, ur: NEGATIVE mg/dL
Leukocytes, UA: NEGATIVE
NITRITE: NEGATIVE
Protein, ur: NEGATIVE mg/dL
SPECIFIC GRAVITY, URINE: 1.01 (ref 1.005–1.030)
UROBILINOGEN UA: 1 mg/dL (ref 0.0–1.0)
pH: 6.5 (ref 5.0–8.0)

## 2014-03-06 LAB — TSH: TSH: 2.62 u[IU]/mL (ref 0.350–4.500)

## 2014-03-06 LAB — I-STAT CG4 LACTIC ACID, ED: Lactic Acid, Venous: 0.67 mmol/L (ref 0.5–2.2)

## 2014-03-06 LAB — URINE MICROSCOPIC-ADD ON

## 2014-03-06 LAB — PROTIME-INR
INR: 2.14 — ABNORMAL HIGH (ref 0.00–1.49)
Prothrombin Time: 23.9 seconds — ABNORMAL HIGH (ref 11.6–15.2)

## 2014-03-06 MED ORDER — DOCUSATE SODIUM 100 MG PO CAPS
100.0000 mg | ORAL_CAPSULE | Freq: Two times a day (BID) | ORAL | Status: DC
  Administered 2014-03-06 – 2014-03-08 (×4): 100 mg via ORAL
  Filled 2014-03-06 (×5): qty 1

## 2014-03-06 MED ORDER — ONDANSETRON HCL 4 MG PO TABS: 4.0000 mg | ORAL_TABLET | Freq: Four times a day (QID) | ORAL | Status: DC | PRN

## 2014-03-06 MED ORDER — SENNOSIDES-DOCUSATE SODIUM 8.6-50 MG PO TABS
1.0000 | ORAL_TABLET | Freq: Every evening | ORAL | Status: DC | PRN
  Filled 2014-03-06: qty 1

## 2014-03-06 MED ORDER — ACETAMINOPHEN 650 MG RE SUPP: 650.0000 mg | Freq: Four times a day (QID) | RECTAL | Status: DC | PRN

## 2014-03-06 MED ORDER — WARFARIN - PHARMACIST DOSING INPATIENT: Freq: Every day | Status: DC

## 2014-03-06 MED ORDER — TAMSULOSIN HCL 0.4 MG PO CAPS
0.4000 mg | ORAL_CAPSULE | Freq: Every day | ORAL | Status: DC
  Administered 2014-03-07: 0.4 mg via ORAL
  Filled 2014-03-06 (×2): qty 1

## 2014-03-06 MED ORDER — DEXTROSE 5 % IV SOLN: 1.0000 g | Freq: Once | INTRAVENOUS | Status: DC

## 2014-03-06 MED ORDER — FENOFIBRATE 54 MG PO TABS: 54.0000 mg | ORAL_TABLET | Freq: Every day | ORAL | Status: DC

## 2014-03-06 MED ORDER — DEXTROSE 5 % IV SOLN
500.0000 mg | Freq: Once | INTRAVENOUS | Status: AC
  Administered 2014-03-06: 500 mg via INTRAVENOUS
  Filled 2014-03-06: qty 500

## 2014-03-06 MED ORDER — ACETAMINOPHEN 325 MG PO TABS: 650.0000 mg | ORAL_TABLET | Freq: Four times a day (QID) | ORAL | Status: DC | PRN

## 2014-03-06 MED ORDER — SODIUM CHLORIDE 0.9 % IJ SOLN
3.0000 mL | Freq: Two times a day (BID) | INTRAMUSCULAR | Status: DC
  Administered 2014-03-07 – 2014-03-08 (×2): 3 mL via INTRAVENOUS

## 2014-03-06 MED ORDER — ONDANSETRON HCL 4 MG/2ML IJ SOLN: 4.0000 mg | Freq: Four times a day (QID) | INTRAMUSCULAR | Status: DC | PRN

## 2014-03-06 MED ORDER — LEVOTHYROXINE SODIUM 25 MCG PO TABS
25.0000 ug | ORAL_TABLET | Freq: Every day | ORAL | Status: DC
  Administered 2014-03-07 – 2014-03-08 (×2): 25 ug via ORAL
  Filled 2014-03-06 (×3): qty 1

## 2014-03-06 MED ORDER — DEXTROSE 5 % IV SOLN
1.0000 g | INTRAVENOUS | Status: DC
  Administered 2014-03-06 – 2014-03-07 (×2): 1 g via INTRAVENOUS
  Filled 2014-03-06 (×3): qty 10

## 2014-03-06 MED ORDER — AZITHROMYCIN 500 MG PO TABS
500.0000 mg | ORAL_TABLET | Freq: Every day | ORAL | Status: DC
  Administered 2014-03-07 – 2014-03-08 (×2): 500 mg via ORAL
  Filled 2014-03-06 (×2): qty 1

## 2014-03-06 MED ORDER — SODIUM CHLORIDE 0.9 % IV BOLUS (SEPSIS)
500.0000 mL | Freq: Once | INTRAVENOUS | Status: AC
  Administered 2014-03-06: 500 mL via INTRAVENOUS

## 2014-03-06 NOTE — ED Notes (Signed)
Received pt from home with c/o wife called EMS due to pt more lethargic than normal. Pt symptoms onset last night. Pt AAox4 for EMS. Systolic BP for EMS 78 while sitting. Pt given 125 ML of NSS by EMS.

## 2014-03-06 NOTE — Progress Notes (Signed)
ANTICOAGULATION CONSULT NOTE - Initial Consult  Pharmacy Consult for Coumadin Indication: atrial fibrillation  Allergies  Allergen Reactions  . Ace Inhibitors     Cannot remember the reaction    Patient Measurements: Height: 5\' 11"  (180.3 cm) Weight: 160 lb (72.576 kg) IBW/kg (Calculated) : 75.3  Vital Signs: Temp: 97.5 F (36.4 C) (07/11 1639) Temp src: Oral (07/11 1639) BP: 102/41 mmHg (07/11 1639) Pulse Rate: 67 (07/11 1639)  Labs:  Recent Labs  03/06/14 1200 03/06/14 1439  HGB 10.1*  --   HCT 31.5*  --   PLT 99*  --   LABPROT  --  23.9*  INR  --  2.14*  CREATININE 2.44*  --     Estimated Creatinine Clearance: 19.8 ml/min (by C-G formula based on Cr of 2.44).   Medical History: Past Medical History  Diagnosis Date  . Coronary artery disease     a.  s/p MI;  b. s/p CABG 2007;  c. cath 12/08: LM occluded, pRCA 80%, dRCA 50%, PLB 90%, S-PDA ok, S-Dx ok with occluded dist Dx; L-LAD ok, S-OM ok with 90% after graft insertion . . . . distal OM treated with DES  . Ischemic cardiomyopathy     echo 6/12 EF 35-40%, apical, dAnt, mid to dist inf-lat HK, mild AI, mod MR, mod BAE, PASP 44  . Chronic combined systolic and diastolic heart failure     admx 8/13;   . Hypertension   . Atrial fibrillation     a. on coumadin.  . CKD (chronic kidney disease), stage IV   . CVA (cerebral infarction)     left frontal  . Anemia     chronic  . Hypothyroidism   . Leg muscle spasm   . GERD (gastroesophageal reflux disease)   . Skin cancer      Assessment: 80 y.o. male With history of atrial fibrillation on chronic coumadin who is admitted d/t unresponsive episode at home today. Pharmacy is consulted to manage coumadin dosing. INR 2.14 on admission. Pt. Has received a dose today. Hgb 10.1, plt 99  PTA dose: 2.5mg  daily except 3.75mg  on Mondays and Thursdays   Goal of Therapy:  INR 2-3 Monitor platelets by anticoagulation protocol: Yes   Plan:  - No coumadin tonight  since pt. already had today's dose - Daily PT/INR - Monitor plt closely.  Maryanna Shape, PharmD, BCPS  Clinical Pharmacist  Pager: [phone]  03/06/2014,5:37 PM

## 2014-03-06 NOTE — H&P (Signed)
[facility] History and Physical  [name] GORIN Sr. NOB:[id number] DOB: Jun 21, 1920 DOA: 03/06/2014  Referring physician: Mingo Amber PCP: Scarlette Calico, MD   Chief Complaint: unresponsive episode  HPI: [name] Amos Sr. is a 80 y.o. male  With history of atrial fibrillation, chronic systolic heart failure, chronic kidney disease, hypothyroidism who had an unresponsive episode at home today. Patient's wife reportedly found him drowsy and poorly responsive. There was no loss of consciousness. When EMS arrived, his systolic blood pressure was reportedly 78. He received a small amount of IV fluid en route. Not hypotensive in the ED, but had a mild orthostatic drop in blood pressure with standing. Mental status back to baseline in ED.  Workup significant for RML pneumonia.  Patient denies cough or dyspnea.  Does report having chills last night, however. WBC and temperature normal.  No N/V/D   Review of Systems:  Systems reviewed. As above. Otherwise normal.  Past Medical History  Diagnosis Date  . Coronary artery disease     a.  s/p MI;  b. s/p CABG 2007;  c. cath 12/08: LM occluded, pRCA 80%, dRCA 50%, PLB 90%, S-PDA ok, S-Dx ok with occluded dist Dx; L-LAD ok, S-OM ok with 90% after graft insertion . . . . distal OM treated with DES  . Ischemic cardiomyopathy     echo 6/12 EF 35-40%, apical, dAnt, mid to dist inf-lat HK, mild AI, mod MR, mod BAE, PASP 44  . Chronic combined systolic and diastolic heart failure     admx 8/13;   . Hypertension   . Atrial fibrillation     a. on coumadin.  . CKD (chronic kidney disease), stage IV   . CVA (cerebral infarction)     left frontal  . Anemia     chronic  . Hypothyroidism   . Leg muscle spasm   . GERD (gastroesophageal reflux disease)   . Skin cancer    Past Surgical History  Procedure Laterality Date  . Coronary artery bypass graft  2007  . Coronary angioplasty with stent placement     Social History:  reports that he has never  smoked. He has never used smokeless tobacco. He reports that he does not drink alcohol or use illicit drugs.  Allergies  Allergen Reactions  . Ace Inhibitors     Cannot remember the reaction    Family History  Problem Relation Age of Onset  . Cancer Mother   . Stroke Father      Prior to Admission medications   Medication Sig Start Date End Date Taking? Authorizing Provider  carvedilol (COREG) 3.125 MG tablet Take 3.125 mg by mouth 2 (two) times daily with a meal.   Yes Historical Provider, MD  Dietary Management Product (VASCULERA) TABS Take 1 capsule by mouth daily. 630mg  06/23/13  Yes Janith Lima, MD  fenofibrate 54 MG tablet Take 54 mg by mouth daily.   Yes Historical Provider, MD  furosemide (LASIX) 20 MG tablet Take 40 mg by mouth daily.   Yes Historical Provider, MD  l-methylfolate-B6-B12 (METANX) 3-35-2 MG TABS Take 1 tablet by mouth daily.   Yes Historical Provider, MD  levothyroxine (SYNTHROID, LEVOTHROID) 25 MCG tablet Take 25 mcg by mouth daily before breakfast.   Yes Historical Provider, MD  nitroGLYCERIN (NITROSTAT) 0.4 MG SL tablet Place 0.4 mg under the tongue every 5 (five) minutes as needed for chest pain.   Yes Historical Provider, MD  silodosin (RAPAFLO) 8 MG CAPS capsule Take 1 capsule (8  mg total) by mouth daily with breakfast. 05/06/13  Yes Janith Lima, MD  warfarin (COUMADIN) 2.5 MG tablet Take 2.5-3.75 mg by mouth See admin instructions. Take one (1) tablet by mouth daily except take 1.5 tablets on Mondays and Thursdays.   Yes Historical Provider, MD   Physical Exam: Filed Vitals:   03/06/14 1639  BP: 102/41  Pulse: 67  Temp: 97.5 F (36.4 C)  Resp: 16    BP 102/41  Pulse 67  Temp(Src) 97.5 F (36.4 C) (Oral)  Resp 16  Ht 5\' 11"  (1.803 m)  Wt 72.576 kg (160 lb)  BMI 22.33 kg/m2  SpO2 100%  BP 102/41  Pulse 67  Temp(Src) 97.5 F (36.4 C) (Oral)  Resp 16  Ht 5\' 11"  (1.803 m)  Wt 72.576 kg (160 lb)  BMI 22.33 kg/m2  SpO2 100%  General  Appearance:    Alert, cooperative, no distress, appears stated age  Head:    Normocephalic, without obvious abnormality, atraumatic  Eyes:    PERRL, conjunctiva/corneas clear, EOM's intact,         Nose:   Nares normal, septum midline, mucosa normal, no drainage   or sinus tenderness  Throat:   Lips, mucosa, and tongue normal; teeth and gums normal  Neck:   Supple, symmetrical, trachea midline, no adenopathy;       thyroid:  No enlargement/tenderness/nodules; no carotid   bruit or JVD  Back:     Symmetric, no curvature, ROM normal, no CVA tenderness  Lungs:     Slight rales mid right lung. No wheeze or rhonchi  Chest wall:    No tenderness or deformity  Heart:    Regular rate and rhythm, S1 and S2 normal, no murmur, rub   or gallop  Abdomen:     Soft, non-tender, bowel sounds active all four quadrants,    no masses, no organomegaly  Genitalia:   deferred  Rectal:   deferred  Extremities:   Extremities normal, atraumatic, no cyanosis or edema  Pulses:   2+ and symmetric all extremities  Skin:   Skin color, texture, turgor normal, no rashes or lesions  Lymph nodes:   Cervical, supraclavicular, and axillary nodes normal  Neurologic:   CNII-XII intact. Normal strength, sensation and reflexes      throughout            Psych: normal affect Labs on Admission:  Basic Metabolic Panel:  Recent Labs Lab 03/06/14 1200  NA 144  K 4.2  CL 105  CO2 25  GLUCOSE 165*  BUN 64*  CREATININE 2.44*  CALCIUM 8.7   Liver Function Tests:  Recent Labs Lab 03/06/14 1200  AST 18  ALT 11  ALKPHOS 38*  BILITOT 0.9  PROT 6.9  ALBUMIN 3.8   No results found for this basename: LIPASE, AMYLASE,  in the last 168 hours No results found for this basename: AMMONIA,  in the last 168 hours CBC:  Recent Labs Lab 03/06/14 1200  WBC 10.4  HGB 10.1*  HCT 31.5*  MCV 93.8  PLT 99*   Cardiac Enzymes: No results found for this basename: CKTOTAL, CKMB, CKMBINDEX, TROPONINI,  in the last 168  hours  BNP (last 3 results)  Recent Labs  12/14/13 1846 03/06/14 1206  PROBNP 13420.0* 15268.0*   CBG:  Recent Labs Lab 03/06/14 1157  GLUCAP 158*    Radiological Exams on Admission: Dg Chest 2 View  03/06/2014   CLINICAL DATA:  Weakness.  EXAM: CHEST  2  VIEW  COMPARISON:  12/14/2013.  FINDINGS: Mediastinum and hilar structures are normal. Right middle lobe infiltrate consistent with pneumonia noted. No pleural effusion or pneumothorax. Cardiomegaly. Prior CABG. No acute bony abnormality. Diffuse thoracic cage osteopenia and degenerative change. Old upper thoracic spine compression fractures.  IMPRESSION: 1. Right middle lobe pneumonia. 2. Stable cardiomegaly.  Prior CABG.   Electronically Signed   By: Marcello Moores  Register   On: 03/06/2014 14:15   EKG Atrial fibrillation RBBB and LPFB  Assessment/Plan Principal Problem:   CAP (community acquired pneumonia): admit to tele. Continue rocephin, azithro. Check urine legionella and strep pneumonia antigens.  Presentation atypical, therefore needs f/u CXR 4-6 weeks to ensure resolution Active Problems:   ANEMIA, CHRONIC   CAD   Atrial fibrillation   Long term current use of anticoagulant   HYPOTHYROIDISM: check TSH   Chronic combined systolic and diastolic heart failure: EF 35 %. compensated   CKD (chronic kidney disease), stage IV   Orthostatic hypotension: hold coreg and lasix   Thrombocytopenia, unspecified, chronic  PT eval. Walks with walker/cane. Has fall hx  Code Status: full Family Communication: daughter Disposition Plan: home with home health  Time spent: 34 min  [facility] Pager [phone]

## 2014-03-06 NOTE — ED Provider Notes (Signed)
CSN: [id number]     Arrival date & time 03/06/14  1142 History   First MD Initiated Contact with Patient 03/06/14 1147     Chief Complaint  Patient presents with  . Altered Mental Status     (Consider location/radiation/quality/duration/timing/severity/associated sxs/prior Treatment) Patient is a 80 y.o. male presenting with altered mental status. The history is provided by the patient.  Altered Mental Status Presenting symptoms: lethargy   Severity:  Mild Most recent episode:  Yesterday Episode history:  Single Timing:  Constant Progression:  Unchanged Chronicity:  New Context: not dementia, not a recent illness and not a recent infection   Associated symptoms: no abdominal pain, no fever and no vomiting     Past Medical History  Diagnosis Date  . Coronary artery disease     a.  s/p MI;  b. s/p CABG 2007;  c. cath 12/08: LM occluded, pRCA 80%, dRCA 50%, PLB 90%, S-PDA ok, S-Dx ok with occluded dist Dx; L-LAD ok, S-OM ok with 90% after graft insertion . . . . distal OM treated with DES  . Ischemic cardiomyopathy     echo 6/12 EF 35-40%, apical, dAnt, mid to dist inf-lat HK, mild AI, mod MR, mod BAE, PASP 44  . Chronic combined systolic and diastolic heart failure     admx 8/13;   . Hypertension   . Atrial fibrillation     a. on coumadin.  . CKD (chronic kidney disease), stage IV   . CVA (cerebral infarction)     left frontal  . Anemia     chronic  . Hypothyroidism   . Leg muscle spasm   . GERD (gastroesophageal reflux disease)   . Skin cancer    Past Surgical History  Procedure Laterality Date  . Coronary artery bypass graft  2007  . Coronary angioplasty with stent placement     Family History  Problem Relation Age of Onset  . Cancer Mother   . Stroke Father    History  Substance Use Topics  . Smoking status: Never Smoker   . Smokeless tobacco: Never Used  . Alcohol Use: No    Review of Systems  Constitutional: Negative for fever.  Respiratory: Positive  for cough (very mild). Negative for shortness of breath.   Gastrointestinal: Negative for vomiting and abdominal pain.  All other systems reviewed and are negative.     Allergies  Ace inhibitors  Home Medications   Prior to Admission medications   Medication Sig Start Date End Date Taking? Authorizing Provider  carvedilol (COREG) 3.125 MG tablet Take 3.125 mg by mouth 2 (two) times daily with a meal.    Historical Provider, MD  Dietary Management Product (VASCULERA) TABS Take 1 capsule by mouth daily. 630mg  06/23/13   Janith Lima, MD  fenofibrate 54 MG tablet Take 54 mg by mouth daily.    Historical Provider, MD  furosemide (LASIX) 20 MG tablet Take 40 mg by mouth daily.    Historical Provider, MD  l-methylfolate-B6-B12 (METANX) 3-35-2 MG TABS TAKE 1 TABLET BY MOUTH TWICE DAILY 10/20/13   Larey Seat, MD  levothyroxine (SYNTHROID, LEVOTHROID) 25 MCG tablet Take 25 mcg by mouth daily before breakfast.    Historical Provider, MD  nitroGLYCERIN (NITROSTAT) 0.4 MG SL tablet Place 0.4 mg under the tongue every 5 (five) minutes as needed for chest pain.    Historical Provider, MD  silodosin (RAPAFLO) 8 MG CAPS capsule Take 1 capsule (8 mg total) by mouth daily with breakfast. 05/06/13  Janith Lima, MD  warfarin (COUMADIN) 2.5 MG tablet 1 tablet daily except 1.5 tablets only on Mondays and Thursdays or as directed by coumadin clinic 12/28/13   Minus Breeding, MD   BP 118/38  Pulse 81  Temp(Src) 98.4 F (36.9 C) (Oral)  Resp 18  Ht 5\' 11"  (1.803 m)  Wt 160 lb (72.576 kg)  BMI 22.33 kg/m2  SpO2 99% Physical Exam  Nursing note and vitals reviewed. Constitutional: He is oriented to person, place, and time. He appears well-developed and well-nourished. No distress.  HENT:  Head: Normocephalic and atraumatic.  Mouth/Throat: Oropharynx is clear and moist. No oropharyngeal exudate.  Eyes: EOM are normal. Pupils are equal, round, and reactive to light.  Neck: Normal range of motion.  Neck supple.  Cardiovascular: Normal rate and regular rhythm.  Exam reveals no friction rub.   No murmur heard. Pulmonary/Chest: Effort normal and breath sounds normal. No respiratory distress. He has no wheezes. He has no rales.  Abdominal: Soft. He exhibits no distension. There is no tenderness. There is no rebound.  Musculoskeletal: Normal range of motion. He exhibits no edema.  Neurological: He is alert and oriented to person, place, and time.  Skin: He is not diaphoretic.    ED Course  Procedures (including critical care time) Labs Review Labs Reviewed  CBG MONITORING, ED - Abnormal; Notable for the following:    Glucose-Capillary 158 (*)    All other components within normal limits  CBC  COMPREHENSIVE METABOLIC PANEL  URINALYSIS, ROUTINE W REFLEX MICROSCOPIC  PRO B NATRIURETIC PEPTIDE  I-STAT TROPOININ, ED  I-STAT CG4 LACTIC ACID, ED    Imaging Review No results found.   EKG Interpretation   Date/Time:  Saturday March 06 2014 11:51:05 EDT Ventricular Rate:  75 PR Interval:    QRS Duration: 147 QT Interval:  465 QTC Calculation: 519 R Axis:   107 Text Interpretation:  Atrial fibrillation RBBB and LPFB Similar to prior  Confirmed by Mingo Amber  MD, [place] ([id number]) on 03/06/2014 12:04:39 PM      MDM   Final diagnoses:  CAP (community acquired pneumonia)  Hypotension, unspecified hypotension type    6M presents from home with concerns for weakness and lethargy. Per the daughter who is in the room, wife reported he was not acting like himself and more lethargic than normal. This began last night. He was hypotensive, 80/40, with EMS. Patient is not hypotensive here. He is alert and oriented and speaking complete sentences. He states he feels well and is not sure why he is here. On exam his lungs are clear. Belly soft and benign. HEENT exam is normal. We'll do screening labs, EKG, chest x-ray. CXR shows PNA, orthostatic vitals positive. Admitted.  Osvaldo Shipper,  MD 03/06/14 [phone]

## 2014-03-06 NOTE — Progress Notes (Signed)
PHARMACIST - PHYSICIAN COMMUNICATION  CONCERNING:  fenofibrate   RECOMMENDATION: Fenofibrate is contraindicated when patient's renal function is <7mL/min. This order has been rejected. If patient's renal function improves, may reorder. If this is patient's baseline, do not recommend resuming medication upon discharge. Please call pharmacy with any questions. Thank you.

## 2014-03-07 LAB — CBC WITH DIFFERENTIAL/PLATELET
Basophils Absolute: 0 10*3/uL (ref 0.0–0.1)
Basophils Relative: 0 % (ref 0–1)
EOS ABS: 0.2 10*3/uL (ref 0.0–0.7)
Eosinophils Relative: 3 % (ref 0–5)
HEMATOCRIT: 28.8 % — AB (ref 39.0–52.0)
HEMOGLOBIN: 9.3 g/dL — AB (ref 13.0–17.0)
Lymphocytes Relative: 23 % (ref 12–46)
Lymphs Abs: 1.4 10*3/uL (ref 0.7–4.0)
MCH: 30.3 pg (ref 26.0–34.0)
MCHC: 32.3 g/dL (ref 30.0–36.0)
MCV: 93.8 fL (ref 78.0–100.0)
MONO ABS: 0.6 10*3/uL (ref 0.1–1.0)
Monocytes Relative: 10 % (ref 3–12)
NEUTROS ABS: 3.9 10*3/uL (ref 1.7–7.7)
Neutrophils Relative %: 64 % (ref 43–77)
Platelets: 99 10*3/uL — ABNORMAL LOW (ref 150–400)
RBC: 3.07 MIL/uL — ABNORMAL LOW (ref 4.22–5.81)
RDW: 14.9 % (ref 11.5–15.5)
WBC: 6.1 10*3/uL (ref 4.0–10.5)

## 2014-03-07 LAB — BASIC METABOLIC PANEL
Anion gap: 14 (ref 5–15)
BUN: 62 mg/dL — AB (ref 6–23)
CHLORIDE: 102 meq/L (ref 96–112)
CO2: 26 mEq/L (ref 19–32)
Calcium: 8.5 mg/dL (ref 8.4–10.5)
Creatinine, Ser: 2.39 mg/dL — ABNORMAL HIGH (ref 0.50–1.35)
GFR calc Af Amer: 26 mL/min — ABNORMAL LOW (ref >90)
GFR calc non Af Amer: 22 mL/min — ABNORMAL LOW (ref >90)
GLUCOSE: 104 mg/dL — AB (ref 70–99)
Potassium: 4.1 mEq/L (ref 3.7–5.3)
Sodium: 142 mEq/L (ref 137–147)

## 2014-03-07 LAB — STREP PNEUMONIAE URINARY ANTIGEN: Strep Pneumo Urinary Antigen: NEGATIVE

## 2014-03-07 LAB — PROTIME-INR
INR: 2.45 — ABNORMAL HIGH (ref 0.00–1.49)
PROTHROMBIN TIME: 26.6 s — AB (ref 11.6–15.2)

## 2014-03-07 MED ORDER — CARVEDILOL 3.125 MG PO TABS
3.1250 mg | ORAL_TABLET | Freq: Two times a day (BID) | ORAL | Status: DC
  Administered 2014-03-07 – 2014-03-08 (×2): 3.125 mg via ORAL
  Filled 2014-03-07 (×4): qty 1

## 2014-03-07 MED ORDER — WARFARIN SODIUM 2.5 MG PO TABS
2.5000 mg | ORAL_TABLET | ORAL | Status: DC
  Administered 2014-03-07: 2.5 mg via ORAL
  Filled 2014-03-07: qty 1

## 2014-03-07 MED ORDER — WARFARIN SODIUM 2.5 MG PO TABS
3.7500 mg | ORAL_TABLET | ORAL | Status: DC
  Filled 2014-03-07: qty 1

## 2014-03-07 NOTE — Progress Notes (Signed)
[facility] PROGRESS NOTE  [name] Sr. LGX:[id number] DOB: January 25, 1920 DOA: 03/06/2014 PCP: Scarlette Calico, MD  Assessment/Plan: 1. Hypotension - In context of a patient on B blocker for atrial fibrillation and recent diagnosis of PNA - Resolved after IV fluid administration initially. - Obtain orthostatic vital signs - Given improvement in blood pressure we'll place back on beta blocker  2. atrial fibrillation - Has had increasing heart rate above 110 and documented at 119. Given improvement of blood pressure we'll place back on beta blocker - Continue Coumadin per pharmacy dosing. Although may have to discuss discontinuation given recent history of orthostatic hypotension  3. Hypothyroidism - TSH within normal limits. - Continue home regimen Synthroid  4. CAP -Continue current antibiotic regimen - Patient breathing comfortably on room air on exam  Code Status: Full Family Communication: No family at bedside Disposition Plan: With improvement in blood pressures   Consultants:  Physical therapy  Procedures:  None  Antibiotics:  Azithromycin and ceftriaxone  HPI/Subjective: No new complaints, feels better  Objective: Filed Vitals:   03/07/14 1401  BP: 131/57  Pulse: 119  Temp: 97.4 F (36.3 C)  Resp: 17    Intake/Output Summary (Last 24 hours) at 03/07/14 1510 Last data filed at 03/07/14 1300  Gross per 24 hour  Intake    480 ml  Output      0 ml  Net    480 ml   Filed Weights   03/06/14 1149 03/07/14 0500  Weight: 72.576 kg (160 lb) 70.6 kg (155 lb 10.3 oz)    Exam:   General:  Patient in no acute distress, alert and awake  Cardiovascular: Regular rate and rhythm, no murmurs or rubs  Respiratory: Clear to auscultation bilaterally, no wheeze  Abdomen: Soft, nondistended, nontender  Musculoskeletal: No cyanosis or clubbing   Data Reviewed: Basic Metabolic Panel:  Recent Labs Lab 03/06/14 1200 03/07/14 0510  NA 144 142  K  4.2 4.1  CL 105 102  CO2 25 26  GLUCOSE 165* 104*  BUN 64* 62*  CREATININE 2.44* 2.39*  CALCIUM 8.7 8.5   Liver Function Tests:  Recent Labs Lab 03/06/14 1200  AST 18  ALT 11  ALKPHOS 38*  BILITOT 0.9  PROT 6.9  ALBUMIN 3.8   No results found for this basename: LIPASE, AMYLASE,  in the last 168 hours No results found for this basename: AMMONIA,  in the last 168 hours CBC:  Recent Labs Lab 03/06/14 1200 03/07/14 0510  WBC 10.4 6.1  NEUTROABS  --  3.9  HGB 10.1* 9.3*  HCT 31.5* 28.8*  MCV 93.8 93.8  PLT 99* 99*   Cardiac Enzymes: No results found for this basename: CKTOTAL, CKMB, CKMBINDEX, TROPONINI,  in the last 168 hours BNP (last 3 results)  Recent Labs  12/14/13 1846 03/06/14 1206  PROBNP 13420.0* 15268.0*   CBG:  Recent Labs Lab 03/06/14 1157  GLUCAP 158*    Recent Results (from the past 240 hour(s))  CULTURE, BLOOD (ROUTINE X 2)     Status: None   Collection Time    03/06/14  2:45 PM      Result Value Ref Range Status   Specimen Description BLOOD RIGHT ARM   Final   Special Requests BOTTLES DRAWN AEROBIC AND ANAEROBIC 10 CC   Final   Culture  Setup Time     Final   Value: 03/06/2014 22:01     Performed at [facility]  Final   Value:        BLOOD CULTURE RECEIVED NO GROWTH TO DATE CULTURE WILL BE HELD FOR 5 DAYS BEFORE ISSUING A FINAL NEGATIVE REPORT     Performed at [facility]   Report Status PENDING   Incomplete  CULTURE, BLOOD (ROUTINE X 2)     Status: None   Collection Time    03/06/14  3:00 PM      Result Value Ref Range Status   Specimen Description BLOOD RIGHT ARM   Final   Special Requests BOTTLES DRAWN AEROBIC AND ANAEROBIC 10 CC   Final   Culture  Setup Time     Final   Value: 03/06/2014 22:01     Performed at [facility]   Culture     Final   Value:        BLOOD CULTURE RECEIVED NO GROWTH TO DATE CULTURE WILL BE HELD FOR 5 DAYS BEFORE ISSUING A FINAL NEGATIVE REPORT     Performed at  [facility]   Report Status PENDING   Incomplete     Studies: Dg Chest 2 View  03/06/2014   CLINICAL DATA:  Weakness.  EXAM: CHEST  2 VIEW  COMPARISON:  12/14/2013.  FINDINGS: Mediastinum and hilar structures are normal. Right middle lobe infiltrate consistent with pneumonia noted. No pleural effusion or pneumothorax. Cardiomegaly. Prior CABG. No acute bony abnormality. Diffuse thoracic cage osteopenia and degenerative change. Old upper thoracic spine compression fractures.  IMPRESSION: 1. Right middle lobe pneumonia. 2. Stable cardiomegaly.  Prior CABG.   Electronically Signed   By: Economy   On: 03/06/2014 14:15    Scheduled Meds: . azithromycin  500 mg Oral Daily  . carvedilol  3.125 mg Oral BID WC  . cefTRIAXone (ROCEPHIN)  IV  1 g Intravenous Q24H  . docusate sodium  100 mg Oral BID  . levothyroxine  25 mcg Oral QAC breakfast  . sodium chloride  3 mL Intravenous Q12H  . tamsulosin  0.4 mg Oral QPC supper  . warfarin  2.5 mg Oral Once per day on Sun Tue Wed Fri Sat  . [START ON 03/08/2014] warfarin  3.75 mg Oral Once per day on Mon Thu  . Warfarin - Pharmacist Dosing Inpatient   Does not apply q1800   Continuous Infusions:    Time spent: > 35 minutes    Velvet Bathe  [facility] Pager [phone] If 7PM-7AM, please contact night-coverage at [email or web address], password [facility] 03/07/2014, 3:10 PM  LOS: 1 day

## 2014-03-07 NOTE — Evaluation (Signed)
Physical Therapy Evaluation Patient Details Name: [name] NATZKE Sr. MRN: [id number] DOB: Apr 07, 1920 Today's Date: 03/07/2014   History of Present Illness  Patient is a 80 yo male admitted 03/06/14 with unresponsive episode and hypotension.  Patient with CAP.  H/o Afib, CHF, CKD, CAD, MI, CABG, ICM with EF of 35-40%, HTN.  Clinical Impression  Patient presents with problems listed below.  Will benefit from acute PT to maximize independence prior to discharge home with wife.  Recommended HHPT for continued therapy for balance - patient declined any f/u PT.    Follow Up Recommendations Home health PT;Supervision/Assistance - 24 hour (Patient declining any f/u PT services)    Equipment Recommendations  None recommended by PT    Recommendations for Other Services       Precautions / Restrictions Precautions Precautions: Fall Restrictions Weight Bearing Restrictions: No      Mobility  Bed Mobility Overal bed mobility: Modified Independent             General bed mobility comments: Use of rail  Transfers Overall transfer level: Needs assistance Equipment used: 1 person hand held assist Transfers: Sit to/from Stand Sit to Stand: Min guard         General transfer comment: Patient demonstrates good technique.  Assist for balance/safety.  Ambulation/Gait Ambulation/Gait assistance: Min guard Ambulation Distance (Feet): 140 Feet Assistive device: None Gait Pattern/deviations: Step-through pattern;Decreased step length - right;Decreased step length - left;Decreased stride length;Shuffle;Trunk flexed;Narrow base of support Gait velocity: Decreased Gait velocity interpretation: Below normal speed for age/gender General Gait Details: Patient with unsteady gait, with short, shuffling steps.  Patient reaching for hallway rail for balance.  Instructed patient to use his cane at all times when walking for balance/safety.  Stairs            Wheelchair Mobility     Modified Rankin (Stroke Patients Only)       Balance Overall balance assessment: Needs assistance         Standing balance support: Single extremity supported Standing balance-Leahy Scale: Poor                               Pertinent Vitals/Pain     Home Living Family/patient expects to be discharged to:: Private residence Living Arrangements: Spouse/significant other Available Help at Discharge: Family;Available 24 hours/day (Daughter prn) Type of Home: House Home Access: Ramped entrance     Home Layout: One level Home Equipment: Ranger - single point;Walker - 4 wheels      Prior Function Level of Independence: Independent with assistive device(s)         Comments: Uses cane     Hand Dominance        Extremity/Trunk Assessment   Upper Extremity Assessment: Generalized weakness           Lower Extremity Assessment: Generalized weakness      Cervical / Trunk Assessment: Kyphotic  Communication   Communication: HOH  Cognition Arousal/Alertness: Awake/alert Behavior During Therapy: WFL for tasks assessed/performed Overall Cognitive Status: Within Functional Limits for tasks assessed                      General Comments      Exercises        Assessment/Plan    PT Assessment Patient needs continued PT services  PT Diagnosis Abnormality of gait;Generalized weakness   PT Problem List Decreased strength;Decreased activity tolerance;Decreased balance;Decreased mobility;Decreased safety awareness;Cardiopulmonary  status limiting activity  PT Treatment Interventions DME instruction;Gait training;Functional mobility training;Balance training;Patient/family education   PT Goals (Current goals can be found in the Care Plan section) Acute Rehab PT Goals Patient Stated Goal: To go home soon PT Goal Formulation: With patient Time For Goal Achievement: 03/14/14 Potential to Achieve Goals: Fair    Frequency Min 3X/week    Barriers to discharge        Co-evaluation               End of Session Equipment Utilized During Treatment: Gait belt Activity Tolerance: Patient limited by fatigue Patient left: in bed;with call bell/phone within reach;with bed alarm set (sitting EOB for lunch) Nurse Communication: Mobility status         Time: 1131-1207 PT Time Calculation (min): 36 min   Charges:   PT Evaluation $Initial PT Evaluation Tier I: 1 Procedure PT Treatments $Gait Training: 23-37 mins   PT G Codes:          Despina Pole 03/07/2014, 12:59 PM Carita Pian. Sanjuana Kava, [place] Pager [phone]

## 2014-03-07 NOTE — Progress Notes (Signed)
ANTICOAGULATION CONSULT NOTE - Follow Up Consult  Pharmacy Consult for Coumadin Indication: atrial fibrillation  Allergies  Allergen Reactions  . Ace Inhibitors     Cannot remember the reaction    Patient Measurements: Height: 5\' 11"  (180.3 cm) Weight: 155 lb 10.3 oz (70.6 kg) IBW/kg (Calculated) : 75.3  Vital Signs: Temp: 97.9 F (36.6 C) (07/12 0524) Temp src: Oral (07/12 0524) BP: 108/62 mmHg (07/12 0524) Pulse Rate: 68 (07/12 0524)  Labs:  Recent Labs  03/06/14 1200 03/06/14 1439 03/07/14 0510  HGB 10.1*  --  9.3*  HCT 31.5*  --  28.8*  PLT 99*  --  99*  LABPROT  --  23.9* 26.6*  INR  --  2.14* 2.45*  CREATININE 2.44*  --  2.39*    Estimated Creatinine Clearance: 19.7 ml/min (by C-G formula based on Cr of 2.39).   Medical History: Past Medical History  Diagnosis Date  . Coronary artery disease     a.  s/p MI;  b. s/p CABG 2007;  c. cath 12/08: LM occluded, pRCA 80%, dRCA 50%, PLB 90%, S-PDA ok, S-Dx ok with occluded dist Dx; L-LAD ok, S-OM ok with 90% after graft insertion . . . . distal OM treated with DES  . Ischemic cardiomyopathy     echo 6/12 EF 35-40%, apical, dAnt, mid to dist inf-lat HK, mild AI, mod MR, mod BAE, PASP 44  . Chronic combined systolic and diastolic heart failure     admx 8/13;   . Hypertension   . Atrial fibrillation     a. on coumadin.  . CKD (chronic kidney disease), stage IV   . CVA (cerebral infarction)     left frontal  . Anemia     chronic  . Hypothyroidism   . Leg muscle spasm   . GERD (gastroesophageal reflux disease)   . Skin cancer      Assessment: 80 y.o. male With history of atrial fibrillation on chronic coumadin who is admitted d/t unresponsive episode at home.  Pharmacy is consulted to manage coumadin dosing. INR 2.14 on admission.  INR 2.45 today, no bleeding noted, H/H low stable, PLTC low but Hx thrombocytopenia.   CXR RML pna - Azithromycin and Ceftriaxone started - do not interfere with warfarin PTA  dose: 2.5mg  daily except 3.75mg  on Mondays and Thursdays   Goal of Therapy:  INR 2-3 Monitor platelets by anticoagulation protocol: Yes   Plan:  Continuw home dose warfarin 2.5mg  daily except 3.75mg  MTh  - Daily PT/INR - Monitor plt closely.  Bonnita Nasuti Pharm.D. CPP, BCPS Clinical Pharmacist [phone] 03/07/2014 10:06 AM

## 2014-03-07 NOTE — Progress Notes (Signed)
Utilization Review Completed.[name] T7/07/2014  

## 2014-03-07 NOTE — Progress Notes (Signed)
Pharmacist Heart Failure Core Measure Documentation   Assessment: [name] Suchy Arai Sr. has an EF documented as 35% on 06/12 by echo.  Rationale: Heart failure patients with left ventricular systolic dysfunction (LVSD) and an EF < 40% should be prescribed an angiotensin converting enzyme inhibitor (ACEI) or angiotensin receptor blocker (ARB) at discharge unless a contraindication is documented in the medical record.  This patient is not currently on an ACEI or ARB for HF.  This note is being placed in the record in order to provide documentation that a contraindication to the use of these agents is present for this encounter.  ACE Inhibitor or Angiotensin Receptor Blocker is contraindicated (specify all that apply)  [x]   ACEI allergy AND ARB allergy []   Angioedema []   Moderate or severe aortic stenosis []   Hyperkalemia [x]   Hypotension []   Renal artery stenosis [x]   Worsening renal function, preexisting renal disease or dysfunction  [name] E. Kely Dohn, Pharm.D Clinical Pharmacy Resident Pager: [phone] 03/07/2014 3:01 PM

## 2014-03-08 LAB — PROTIME-INR
INR: 1.9 — ABNORMAL HIGH (ref 0.00–1.49)
Prothrombin Time: 21.8 seconds — ABNORMAL HIGH (ref 11.6–15.2)

## 2014-03-08 LAB — LEGIONELLA ANTIGEN, URINE: LEGIONELLA ANTIGEN, URINE: NEGATIVE

## 2014-03-08 LAB — PROCALCITONIN: Procalcitonin: <0.1 ng/mL

## 2014-03-08 MED ORDER — CEFDINIR 300 MG PO CAPS: 300.0000 mg | ORAL_CAPSULE | Freq: Two times a day (BID) | ORAL | Status: DC

## 2014-03-08 MED ORDER — WARFARIN SODIUM 4 MG PO TABS
4.0000 mg | ORAL_TABLET | Freq: Once | ORAL | Status: DC
  Filled 2014-03-08: qty 1

## 2014-03-08 MED ORDER — AZITHROMYCIN 500 MG PO TABS: 500.0000 mg | ORAL_TABLET | Freq: Every day | ORAL | Status: DC

## 2014-03-08 NOTE — Discharge Summary (Signed)
Physician Discharge Summary  ANTOINNE SPADACCINI Sr. IEP:[id number] DOB: 24-Sep-1919 DOA: 03/06/2014  PCP: [name] Calico, MD  Admit date: 03/06/2014 Discharge date: 03/08/2014  Time spent: > 35 minutes  Recommendations for Outpatient Follow-up:  1. Please refer to d/c instructions below.  Discharge Diagnoses:  Please refer to list below  Discharge Condition: stable  Diet recommendation: heart healthy  Filed Weights   03/06/14 1149 03/07/14 0500  Weight: 72.576 kg (160 lb) 70.6 kg (155 lb 10.3 oz)    History of present illness:  80 y/o with h/o atrial fibrillation, chronic systolic HF, hypothyroidism who presented to the hospital after an unresponsive episode.  Hospital Course:  1. Hypotension - In context of a patient on B blocker for atrial fibrillation and recent diagnosis of PNA  - Resolved after IV fluid administration initially.  - PT evaluated and recommended home health PT  2. atrial fibrillation  - rate controlled on home B blocker regimen - Pt to f/u with [name] for INR check in 2-3 days, patient verbalized understanding and agreement.  3. Hypothyroidism  - TSH within normal limits.  - Continue home regimen Synthroid   4. CAP  - d/c on azithromycin to complete a 5 day total course and cephalosporin to complete a 7 day course - Patient breathing comfortably on room air on exam   5. CKD - at baseline   Procedures:  NOne  Consultations:  PT  Discharge Exam: Filed Vitals:   03/08/14 1335  BP: 99/68  Pulse: 87  Temp: 97.1 F (36.2 C)  Resp: 18    General: pt in nad, alert and awake, smiling and whistling Cardiovascular: irregularly irregular, no murmurs Respiratory: CTA BL, no increased WOB on room air, no wheezes  Discharge Instructions You were cared for by a hospitalist during your hospital stay. If you have any questions about your discharge medications or the care you received while you were in the hospital after you are discharged, you can  call the unit and asked to speak with the hospitalist on call if the hospitalist that took care of you is not available. Once you are discharged, your primary care physician will handle any further medical issues. Please note that NO REFILLS for any discharge medications will be authorized once you are discharged, as it is imperative that you return to your primary care physician (or establish a relationship with a primary care physician if you do not have one) for your aftercare needs so that they can reassess your need for medications and monitor your lab values.  Discharge Instructions   Call MD for:  difficulty breathing, headache or visual disturbances    Complete by:  As directed      Call MD for:  temperature >100.4    Complete by:  As directed      Diet - low sodium heart healthy    Complete by:  As directed      Discharge instructions    Complete by:  As directed   Repeat your INR in 2-3 days with [name]     Increase activity slowly    Complete by:  As directed             Medication List         azithromycin 500 MG tablet  Commonly known as:  ZITHROMAX  Take 1 tablet (500 mg total) by mouth daily.  Start taking on:  03/09/2014     carvedilol 3.125 MG tablet  Commonly known as:  COREG  Take 3.125 mg by mouth 2 (two) times daily with a meal.     cefdinir 300 MG capsule  Commonly known as:  OMNICEF  Take 1 capsule (300 mg total) by mouth 2 (two) times daily.     fenofibrate 54 MG tablet  Take 54 mg by mouth daily.     furosemide 20 MG tablet  Commonly known as:  LASIX  Take 40 mg by mouth daily.     l-methylfolate-B6-B12 3-35-2 MG Tabs  Commonly known as:  METANX  Take 1 tablet by mouth daily.     levothyroxine 25 MCG tablet  Commonly known as:  SYNTHROID, LEVOTHROID  Take 25 mcg by mouth daily before breakfast.     nitroGLYCERIN 0.4 MG SL tablet  Commonly known as:  NITROSTAT  Place 0.4 mg under the tongue every 5 (five) minutes as needed for chest  pain.     silodosin 8 MG Caps capsule  Commonly known as:  RAPAFLO  Take 1 capsule (8 mg total) by mouth daily with breakfast.     VASCULERA Tabs  Take 1 capsule by mouth daily. 630mg      warfarin 2.5 MG tablet  Commonly known as:  COUMADIN  Take 2.5-3.75 mg by mouth See admin instructions. Take one (1) tablet by mouth daily except take 1.5 tablets on Mondays and Thursdays.       Allergies  Allergen Reactions  . Ace Inhibitors     Cannot remember the reaction      The results of significant diagnostics from this hospitalization (including imaging, microbiology, ancillary and laboratory) are listed below for reference.    Significant Diagnostic Studies: Dg Chest 2 View  03/06/2014   CLINICAL DATA:  Weakness.  EXAM: CHEST  2 VIEW  COMPARISON:  12/14/2013.  FINDINGS: Mediastinum and hilar structures are normal. Right middle lobe infiltrate consistent with pneumonia noted. No pleural effusion or pneumothorax. Cardiomegaly. Prior CABG. No acute bony abnormality. Diffuse thoracic cage osteopenia and degenerative change. Old upper thoracic spine compression fractures.  IMPRESSION: 1. Right middle lobe pneumonia. 2. Stable cardiomegaly.  Prior CABG.   Electronically Signed   By: Marcello Moores  Register   On: 03/06/2014 14:15    Microbiology: Recent Results (from the past 240 hour(s))  CULTURE, BLOOD (ROUTINE X 2)     Status: None   Collection Time    03/06/14  2:45 PM      Result Value Ref Range Status   Specimen Description BLOOD RIGHT ARM   Final   Special Requests BOTTLES DRAWN AEROBIC AND ANAEROBIC 10 CC   Final   Culture  Setup Time     Final   Value: 03/06/2014 22:01     Performed at [facility]   Culture     Final   Value:        BLOOD CULTURE RECEIVED NO GROWTH TO DATE CULTURE WILL BE HELD FOR 5 DAYS BEFORE ISSUING A FINAL NEGATIVE REPORT     Performed at [facility]   Report Status PENDING   Incomplete  CULTURE, BLOOD (ROUTINE X 2)     Status: None    Collection Time    03/06/14  3:00 PM      Result Value Ref Range Status   Specimen Description BLOOD RIGHT ARM   Final   Special Requests BOTTLES DRAWN AEROBIC AND ANAEROBIC 10 CC   Final   Culture  Setup Time     Final   Value: 03/06/2014 22:01  Performed at [facility]     Final   Value:        BLOOD CULTURE RECEIVED NO GROWTH TO DATE CULTURE WILL BE HELD FOR 5 DAYS BEFORE ISSUING A FINAL NEGATIVE REPORT     Performed at [facility]   Report Status PENDING   Incomplete     Labs: Basic Metabolic Panel:  Recent Labs Lab 03/06/14 1200 03/07/14 0510  NA 144 142  K 4.2 4.1  CL 105 102  CO2 25 26  GLUCOSE 165* 104*  BUN 64* 62*  CREATININE 2.44* 2.39*  CALCIUM 8.7 8.5   Liver Function Tests:  Recent Labs Lab 03/06/14 1200  AST 18  ALT 11  ALKPHOS 38*  BILITOT 0.9  PROT 6.9  ALBUMIN 3.8   No results found for this basename: LIPASE, AMYLASE,  in the last 168 hours No results found for this basename: AMMONIA,  in the last 168 hours CBC:  Recent Labs Lab 03/06/14 1200 03/07/14 0510  WBC 10.4 6.1  NEUTROABS  --  3.9  HGB 10.1* 9.3*  HCT 31.5* 28.8*  MCV 93.8 93.8  PLT 99* 99*   Cardiac Enzymes: No results found for this basename: CKTOTAL, CKMB, CKMBINDEX, TROPONINI,  in the last 168 hours BNP: BNP (last 3 results)  Recent Labs  12/14/13 1846 03/06/14 1206  PROBNP 13420.0* 15268.0*   CBG:  Recent Labs Lab 03/06/14 1157  GLUCAP 158*       Signed:  Velvet Bathe  [facility] 03/08/2014, 2:25 PM

## 2014-03-08 NOTE — Care Management Note (Signed)
    Page 1 of 1   03/08/2014     3:34:49 PM CARE MANAGEMENT NOTE 03/08/2014  Patient:  [name], [name]   Account Number:  [id number]  Date Initiated:  03/08/2014  Documentation initiated by:  GRAVES-BIGELOW,Anum Palecek  Subjective/Objective Assessment:   Pt in with AMS. Treated for CAP. Plan for d/c home today. Pt is from home with wife. Pt states he has DME RW and 2 canes.     Action/Plan:   CM Offered choice for [facility] services and pt states that his home is too small and he will not like for anyone coming in tohis home at this time. RN aware and will make MD aware- no services at this time. Pt aware to contact PCP for any needs.   Anticipated DC Date:  03/08/2014   Anticipated DC Plan:  [place]  CM consult      Choice offered to / List presented to:             Status of service:  Completed, signed off Medicare Important Message given?  YES (If response is "NO", the following Medicare IM given date fields will be blank) Date Medicare IM given:  03/08/2014 Medicare IM given by:  GRAVES-BIGELOW,Charman Blasco Date Additional Medicare IM given:   Additional Medicare IM given by:    Discharge Disposition:  HOME/SELF CARE  Per UR Regulation:  Reviewed for med. necessity/level of care/duration of stay  If discussed at [place] of Stay Meetings, dates discussed:    Comments:

## 2014-03-08 NOTE — Progress Notes (Addendum)
Physical Therapy Treatment Patient Details Name: CHONG WOJDYLA Sr. MRN: [id number] DOB: Feb 19, 1920 Today's Date: 04/07/2014    History of Present Illness Patient is a 80 yo male admitted 03/06/14 with unresponsive episode and hypotension.  Patient with CAP.  H/o Afib, CHF, CKD, CAD, MI, CABG, ICM with EF of 35-40%, HTN.    PT Comments    Pt with improved gait with cane. Recommended to pt to consider using his walker at home until his strength improved.  Follow Up Recommendations  Supervision/Assistance - 24 hour (Pt declining HHPT)     Equipment Recommendations  None recommended by PT    Recommendations for Other Services       Precautions / Restrictions Precautions Precautions: Fall    Mobility  Bed Mobility                  Transfers Overall transfer level: Needs assistance Equipment used: Straight cane Transfers: Sit to/from Stand Sit to Stand: Supervision            Ambulation/Gait Ambulation/Gait assistance: Supervision Ambulation Distance (Feet): 150 Feet Assistive device: Straight cane Gait Pattern/deviations: Step-through pattern;Decreased step length - right;Decreased step length - left;Narrow base of support;Shuffle Gait velocity: Decreased   General Gait Details: Pt with unsteadiness but no loss of balance.   Stairs            Wheelchair Mobility    Modified Rankin (Stroke Patients Only)       Balance           Standing balance support: No upper extremity supported Standing balance-Leahy Scale: Fair                      Cognition Arousal/Alertness: Awake/alert Behavior During Therapy: WFL for tasks assessed/performed Overall Cognitive Status: Within Functional Limits for tasks assessed                      Exercises      General Comments        Pertinent Vitals/Pain No c/o's    Home Living                      Prior Function            PT Goals (current goals can now be found  in the care plan section) Progress towards PT goals: Progressing toward goals    Frequency  Min 3X/week    PT Plan Current plan remains appropriate    Co-evaluation             End of Session Equipment Utilized During Treatment: Gait belt Activity Tolerance: Patient tolerated treatment well Patient left: in bed;with call bell/phone within reach;with bed alarm set (sitting EOB)     Time: 6440-3474 PT Time Calculation (min): 24 min  Charges:  $Gait Training: 23-37 mins                    G Codes:      Evangeline Utley April 07, 2014, 5:22 PM  [facility] [place]

## 2014-03-08 NOTE — Progress Notes (Signed)
ANTICOAGULATION CONSULT NOTE - Follow Up Consult  Pharmacy Consult for Coumadin Indication: atrial fibrillation  Allergies  Allergen Reactions  . Ace Inhibitors     Cannot remember the reaction    Patient Measurements: Height: 5\' 11"  (180.3 cm) Weight: 155 lb 10.3 oz (70.6 kg) IBW/kg (Calculated) : 75.3  Vital Signs: Temp: 98.3 F (36.8 C) (07/13 0515) Temp src: Oral (07/13 0515) BP: 102/49 mmHg (07/13 0515) Pulse Rate: 60 (07/13 0515)  Labs:  Recent Labs  03/06/14 1200 03/06/14 1439 03/07/14 0510 03/08/14 0610  HGB 10.1*  --  9.3*  --   HCT 31.5*  --  28.8*  --   PLT 99*  --  99*  --   LABPROT  --  23.9* 26.6* 21.8*  INR  --  2.14* 2.45* 1.90*  CREATININE 2.44*  --  2.39*  --     Estimated Creatinine Clearance: 19.7 ml/min (by C-G formula based on Cr of 2.39).  Assessment: 79 y.o. Male continues on chronic coumadin for hx afib. INR has now dropped subtherapuetic at 1.9. No new CBC today, no bleeding noted. Of note, pt also started on azithromycin which may increase the INR.   Goal of Therapy:  INR 2-3   Plan:  1. DC current home dose 2. Coumadin 4mg  PO x 1 tonight 2. F/u AM INR  Salome Arnt, PharmD, BCPS Pager # [phone] 03/08/2014 11:28 AM

## 2014-03-11 ENCOUNTER — Other Ambulatory Visit: Payer: Self-pay | Admitting: Cardiovascular Disease

## 2014-03-12 ENCOUNTER — Other Ambulatory Visit: Payer: Self-pay | Admitting: *Deleted

## 2014-03-12 LAB — CULTURE, BLOOD (ROUTINE X 2)
CULTURE: NO GROWTH
Culture: NO GROWTH

## 2014-03-12 MED ORDER — CARVEDILOL 3.125 MG PO TABS: 3.1250 mg | ORAL_TABLET | Freq: Two times a day (BID) | ORAL | Status: DC

## 2014-03-15 ENCOUNTER — Ambulatory Visit (INDEPENDENT_AMBULATORY_CARE_PROVIDER_SITE_OTHER): Payer: Medicare Other | Admitting: *Deleted

## 2014-03-15 DIAGNOSIS — I4891 Unspecified atrial fibrillation: Secondary | ICD-10-CM

## 2014-03-15 DIAGNOSIS — Z8679 Personal history of other diseases of the circulatory system: Secondary | ICD-10-CM

## 2014-03-15 DIAGNOSIS — Z7901 Long term (current) use of anticoagulants: Secondary | ICD-10-CM

## 2014-03-15 LAB — POCT INR: INR: 2.1

## 2014-03-17 ENCOUNTER — Encounter: Payer: Self-pay | Admitting: Neurology

## 2014-03-17 ENCOUNTER — Ambulatory Visit (INDEPENDENT_AMBULATORY_CARE_PROVIDER_SITE_OTHER): Payer: Medicare Other | Admitting: Neurology

## 2014-03-17 VITALS — BP 121/70 | HR 69 | Resp 16 | Wt 156.0 lb

## 2014-03-17 DIAGNOSIS — R269 Unspecified abnormalities of gait and mobility: Secondary | ICD-10-CM | POA: Insufficient documentation

## 2014-03-17 NOTE — Patient Instructions (Signed)

## 2014-03-17 NOTE — Progress Notes (Signed)
[facility]  Provider:  Larey Seat, M D  Referring Provider: Janith Lima, MD Primary Care Physician:  Scarlette Calico, MD  Chief Complaint  Patient presents with  . Follow-up    Room 11  . Peripheral Neuropathy    HPI:  [name] CHEWNING Sr. is a 80 y.o. male  Is seen here as a referral/ revisit  from Dr. Ronnald Ramp for a  Gait disorder / neuropathy follow up,  [name] and his wife live in a private residence.  He was able at the time of my last visit ( 2013)  with [place] to still drive, but his wife was mostly the driver of the family. [name] had given up driving with some resistance in the meantime . He still held a Wellsite geologist. The couple lost their dog and there have been several death in the extended circle of friends and family.  His wife lost suddently vision in one eye, and now cannot longer drive, she is mostly in bed, sleeping 90% of her days. He has been twice hospitalized earlier this year, once with dehydration and once with CHF. He was again hospitalized for pneumonia 2 weeks ago.    He is remarkably alert and eloquent for 80 years of age.  His gait has slowly deteriorated , he uses a cane , he remains walking .  His daughter is his main caretaker.    Review of Systems: Out of a complete 14 system review, the patient complains of only the following symptoms, and all other reviewed systems are negative. GDS 1,  Severe hearing loss.   History   Social History  . Marital Status: Married    Spouse Name: Albertine    Number of Children: 2  . Years of Education: College   Occupational History  . Not on file.   Social History Main Topics  . Smoking status: Never Smoker   . Smokeless tobacco: Never Used  . Alcohol Use: No  . Drug Use: No  . Sexual Activity: Not Currently   Other Topics Concern  . Not on file   Social History Narrative   Patient is married ([place]) and lives at home with his wife.   Patient has two adult children.   Patient is retired.   Patient has a college education.   Patient is right-handed.   Patient drinks one cup in am.    Family History  Problem Relation Age of Onset  . Cancer Mother   . Stroke Father     Past Medical History  Diagnosis Date  . Coronary artery disease     a.  s/p MI;  b. s/p CABG 2007;  c. cath 12/08: LM occluded, pRCA 80%, dRCA 50%, PLB 90%, S-PDA ok, S-Dx ok with occluded dist Dx; L-LAD ok, S-OM ok with 90% after graft insertion . . . . distal OM treated with DES  . Ischemic cardiomyopathy     echo 6/12 EF 35-40%, apical, dAnt, mid to dist inf-lat HK, mild AI, mod MR, mod BAE, PASP 44  . Chronic combined systolic and diastolic heart failure     admx 8/13;   . Hypertension   . Atrial fibrillation     a. on coumadin.  . CKD (chronic kidney disease), stage IV   . CVA (cerebral infarction)     left frontal  . Anemia     chronic  . Hypothyroidism   . Leg muscle spasm   . GERD (gastroesophageal reflux disease)   .  Skin cancer     Past Surgical History  Procedure Laterality Date  . Coronary artery bypass graft  2007  . Coronary angioplasty with stent placement      Current Outpatient Prescriptions  Medication Sig Dispense Refill  . carvedilol (COREG) 3.125 MG tablet Take 1 tablet (3.125 mg total) by mouth 2 (two) times daily with a meal.  60 tablet  5  . Dietary Management Product (VASCULERA) TABS Take 1 capsule by mouth daily. 630mg       . fenofibrate 54 MG tablet Take 54 mg by mouth daily.      . furosemide (LASIX) 20 MG tablet Take 40 mg by mouth daily.      Marland Kitchen levothyroxine (SYNTHROID, LEVOTHROID) 25 MCG tablet Take 25 mcg by mouth daily before breakfast.      . nitroGLYCERIN (NITROSTAT) 0.4 MG SL tablet Place 0.4 mg under the tongue every 5 (five) minutes as needed for chest pain.      . silodosin (RAPAFLO) 8 MG CAPS capsule Take 1 capsule (8 mg total) by mouth daily with breakfast.  90 capsule  3  . warfarin (COUMADIN) 2.5 MG tablet Take 2.5-3.75 mg by  mouth See admin instructions. Take one (1) tablet by mouth daily except take 1.5 tablets on Mondays and Thursdays.      Marland Kitchen l-methylfolate-B6-B12 (METANX) 3-35-2 MG TABS Take 1 tablet by mouth daily.       No current facility-administered medications for this visit.    Allergies as of 03/17/2014 - Review Complete 03/17/2014  Allergen Reaction Noted  . Ace inhibitors      Vitals: BP 121/70  Pulse 69  Resp 16  Wt 156 lb (70.761 kg) Last Weight:  Wt Readings from Last 1 Encounters:  03/17/14 156 lb (70.761 kg)   Last Height:   Ht Readings from Last 1 Encounters:  03/06/14 5\' 11"  (1.803 m)    Physical exam:  General: The patient is awake, alert and appears not in acute distress. The patient is well groomed. Head: Normocephalic, atraumatic. Neck is supple. Mallampati 1, neck circumference:15 ,  Cardiovascular:  irregular rate and rhythm, atrial fibrillation,  without  murmurs or carotid bruit, and without distended neck veins. Respiratory: Lungs are clear to auscultation. Skin:  Without evidence of edema, or rash Trunk: BMI is elevated and patient  has normal posture.  Neurologic exam : The patient is awake and alert, oriented to place and time.  Memory subjective described as intact.  There is a normal attention span & concentration ability. Speech is fluent without  dysarthria, dysphonia or aphasia. Mood and affect are appropriate.  Cranial nerves: Pupils are equal and briskly reactive to light. Funduscopic exam without evidence of pallor or edema.  Extraocular movements  in vertical and horizontal planes intact and without nystagmus. Visual fields by finger perimetry are intact. Hearing impaired   Facial sensation intact to fine touch.  Facial motor strength is symmetric and tongue and uvula move midline.  Motor exam:  Normal tone and normal muscle bulk and symmetric normal strength in all extremities.  Sensory:  Fine touch, pinprick and vibration were decreased in both  ankles and below. Coordination: Rapid alternating movements in the fingers/hands is tested and normal.  Finger-to-nose maneuver tested and normal without evidence of ataxia, dysmetria or tremor. Remarkable.   Gait and station: Patient walks without assistive device wide based.  Tandem gait is deferred, unfragmented. Romberg testing is normal.  Deep tendon reflexes: in the upper and lower extremities are symmetric  and intact.  Babinski maneuver response is downgoing.   Assessment:  After physical and neurologic examination, review of laboratory studies, imaging, neurophysiology testing and pre-existing records, assessment is :  1) very stable, alert, neurologically intact .  Plan:  Treatment plan and additional workup :  No follow up needed, prn.

## 2014-03-22 ENCOUNTER — Ambulatory Visit (INDEPENDENT_AMBULATORY_CARE_PROVIDER_SITE_OTHER): Payer: Medicare Other | Admitting: Internal Medicine

## 2014-03-22 ENCOUNTER — Encounter: Payer: Self-pay | Admitting: Internal Medicine

## 2014-03-22 VITALS — BP 110/70 | HR 87 | Temp 98.2°F | Wt 154.0 lb

## 2014-03-22 DIAGNOSIS — I4891 Unspecified atrial fibrillation: Secondary | ICD-10-CM

## 2014-03-22 DIAGNOSIS — I482 Chronic atrial fibrillation, unspecified: Secondary | ICD-10-CM

## 2014-03-22 DIAGNOSIS — N184 Chronic kidney disease, stage 4 (severe): Secondary | ICD-10-CM

## 2014-03-22 DIAGNOSIS — J189 Pneumonia, unspecified organism: Secondary | ICD-10-CM

## 2014-03-22 DIAGNOSIS — D539 Nutritional anemia, unspecified: Secondary | ICD-10-CM

## 2014-03-22 NOTE — Progress Notes (Signed)
   Subjective:    Patient ID: [name] Amos Sr., male    DOB: June 06, 1920, 80 y.o.   MRN: [id number]  HPI   The hospital records 7/11-7/13/50 were reviewed. He was hospitalized with hypotension in the setting of beta blocker therapy for atrial fibrillation and community-acquired pneumonia. Hypotension resolved with IV fluid replacement  He was discharged to complete a five-day course of azithromycin and a 7 day course of cephalosporin  Atrial fibrillation rate was controlled adequately  Chronic kidney disease labs remained at baseline.On 03/07/14 BUN 62, creatinine 2.39, and GFR 22.   TSH was therapeutic at 2.62.  White count was normal. Present was normochromic normocytic anemia with hemoglobin 9.3 hematocrit 28.8. Platelet count was 99,000.  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 The CE is required unless there is a medical or military exclusion.      Review of Systems  Since discharge he denies fever, paroxysmal nocturnal dyspnea, productive cough, or purulent secretions  He denies lightheadedness or dizziness upon standing.  He has no symptoms of rhinosinusitis. Frontal headache, facial pain , nasal purulence, dental pain, sore throat , otic pain or otic discharge denied.      Objective:   Physical Exam Positive findings: He is wearing hearing aid on  right; hearing acuity is still compromised significantly Pupils are small but symmetric He has a slow irregular rhythm; heart sounds are somewhat distant He has no neck vein distention at 30. There is no respiratory compromise;no abnormal breath sounds No hepatojugular reflux noted He does have 1+ pitting edema Pedal pulses are decreased but equal. Interactive  but he repeatedly inquired about "how people with heart failure die?" Slow ,shuffling gait. Using cane  General appearance is one of adequate  nourishment w/o distress. Eyes: No conjunctival inflammation or scleral icterus is present. Oral exam: lips and gums are healthy appearing.There is no oropharyngeal erythema or exudate noted.  Abdomen: bowel sounds normal, soft and non-tender without masses, organomegaly or hernias noted.  No guarding or rebound .  Musculoskeletal: Able to lie flat and sit up with minimal help. Negative straight leg raising bilaterally.  Skin:Warm & dry.  Intact without suspicious lesions or rashes ; no jaundice or tenting Lymphatic: No lymphadenopathy is noted about the head, neck, axilla                Assessment & Plan:  #1 community-acquired pneumonia, clinically resolved  #2 hypotension; present blood pressure is adequate  #3 atrial fibrillation, controlled ventricular rate and therapeutic PT/INR  #4 chronic renal insufficiency See AVS

## 2014-03-22 NOTE — Progress Notes (Signed)
Pre visit review using our clinic review tool, if applicable. No additional management support is needed unless otherwise documented below in the visit note. 

## 2014-03-23 NOTE — Patient Instructions (Signed)
Mr [name] is medically exempt from continuing education courses related to his profession because of active medical issues not limited to those listed among "Diagnoses". Pascal Lux MD, FACP,FCCP

## 2014-04-01 ENCOUNTER — Ambulatory Visit (INDEPENDENT_AMBULATORY_CARE_PROVIDER_SITE_OTHER): Payer: Medicare Other | Admitting: Nurse Practitioner

## 2014-04-01 ENCOUNTER — Encounter: Payer: Self-pay | Admitting: Nurse Practitioner

## 2014-04-01 VITALS — BP 120/60 | HR 61 | Temp 97.5°F | Ht 71.0 in | Wt 156.4 lb

## 2014-04-01 DIAGNOSIS — S81802A Unspecified open wound, left lower leg, initial encounter: Secondary | ICD-10-CM

## 2014-04-01 DIAGNOSIS — S81809A Unspecified open wound, unspecified lower leg, initial encounter: Secondary | ICD-10-CM

## 2014-04-01 DIAGNOSIS — Z23 Encounter for immunization: Secondary | ICD-10-CM

## 2014-04-01 MED ORDER — BACITRACIN-NEOMYCIN-POLYMYXIN 400-5-5000 EX OINT: 1.0000 "application " | TOPICAL_OINTMENT | Freq: Two times a day (BID) | CUTANEOUS | Status: DC

## 2014-04-01 NOTE — Progress Notes (Signed)
Pre visit review using our clinic review tool, if applicable. No additional management support is needed unless otherwise documented below in the visit note. 

## 2014-04-01 NOTE — Patient Instructions (Addendum)
Patient to cleanse affected area daily with Hibiclens Over the counter. Apply telefa bandage and use paper tape with small amount of Neosporin during the day and remove at night for 4-5 days and then leave open to air.  If wound becomes more reddened, or larger, painful, or draining call clinic for follow up appointment.  Call clinic with questions or concerns Keep regular scheduled appointment with your Primary Car physicican  Wound Care Wound care helps prevent pain and infection.  You may need a tetanus shot if:  You cannot remember when you had your last tetanus shot.  You have never had a tetanus shot.  The injury broke your skin. If you need a tetanus shot and you choose not to have one, you may get tetanus. Sickness from tetanus can be serious. HOME CARE   Only take medicine as told by your doctor.  Clean the wound daily with mild soap and water.  Change any bandages (dressings) as told by your doctor.  Put medicated cream and a bandage on the wound as told by your doctor.  Change the bandage if it gets wet, dirty, or starts to smell.  Take showers. Do not take baths, swim, or do anything that puts your wound under water.  Rest and raise (elevate) the wound until the pain and puffiness (swelling) are better.  Keep all doctor visits as told. GET HELP RIGHT AWAY IF:   Yellowish-white fluid (pus) comes from the wound.  Medicine does not lessen your pain.  There is a red streak going away from the wound.  You have a fever. MAKE SURE YOU:   Understand these instructions.  Will watch your condition.  Will get help right away if you are not doing well or get worse. Document Released: 05/22/2008 Document Revised: 11/05/2011 Document Reviewed: 12/17/2010 [facility] Patient Information 2015 [place], [place]. This information is not intended to replace advice given to you by your health care provider. Make sure you discuss any questions you have with your health care  provider.

## 2014-04-01 NOTE — Progress Notes (Signed)
Subjective:    Patient ID: [name] Amos Sr., male    DOB: 01/16/1920, 80 y.o.   MRN: [id number]  HPI Patient 's daughter present for exam and history.  Patient reports dropping a board while working in his garage on his right  (shin) leg approx 10 days ago.  Using warm water and soap to cleanse the area and has used some neosporin for past few days.  Pulled off bandage which reinjured healing area.  Now with reddened area .  Not painful as reported by patient. Oozing slightly greenish per patients daughter.      Review of Systems  Constitutional: Negative.  Negative for fever.  Cardiovascular: Negative.  Negative for leg swelling.  Skin:       See review as noted in HPI.  Cardiovascular system reviewed.  Medications and problem list noted and reviewed with patient.    Past Medical History  Diagnosis Date  . Coronary artery disease     a.  s/p MI;  b. s/p CABG 2007;  c. cath 12/08: LM occluded, pRCA 80%, dRCA 50%, PLB 90%, S-PDA ok, S-Dx ok with occluded dist Dx; L-LAD ok, S-OM ok with 90% after graft insertion . . . . distal OM treated with DES  . Ischemic cardiomyopathy     echo 6/12 EF 35-40%, apical, dAnt, mid to dist inf-lat HK, mild AI, mod MR, mod BAE, PASP 44  . Chronic combined systolic and diastolic heart failure     admx 8/13;   . Hypertension   . Atrial fibrillation     a. on coumadin.  . CKD (chronic kidney disease), stage IV   . CVA (cerebral infarction)     left frontal  . Anemia     chronic  . Hypothyroidism   . Leg muscle spasm   . GERD (gastroesophageal reflux disease)   . Skin cancer     History   Social History  . Marital Status: Married    Spouse Name: Albertine    Number of Children: 2  . Years of Education: College   Occupational History  . Not on file.   Social History Main Topics  . Smoking status: Never Smoker   . Smokeless tobacco: Never Used  . Alcohol Use: No  . Drug Use: No  . Sexual Activity: Not Currently   Other Topics Concern    . Not on file   Social History Narrative   Patient is married ([place]) and lives at home with his wife.   Patient has two adult children.   Patient is retired.   Patient has a college education.   Patient is right-handed.   Patient drinks one cup in am.    Past Surgical History  Procedure Laterality Date  . Coronary artery bypass graft  2007  . Coronary angioplasty with stent placement      Family History  Problem Relation Age of Onset  . Cancer Mother   . Stroke Father     Allergies  Allergen Reactions  . Ace Inhibitors     Cannot remember the reaction    Current Outpatient Prescriptions on File Prior to Visit  Medication Sig Dispense Refill  . carvedilol (COREG) 3.125 MG tablet Take 1 tablet (3.125 mg total) by mouth 2 (two) times daily with a meal.  60 tablet  5  . fenofibrate 54 MG tablet Take 54 mg by mouth daily.      . furosemide (LASIX) 20 MG tablet Take 40 mg by mouth  daily.      . levothyroxine (SYNTHROID, LEVOTHROID) 25 MCG tablet Take 25 mcg by mouth daily before breakfast.      . nitroGLYCERIN (NITROSTAT) 0.4 MG SL tablet Place 0.4 mg under the tongue every 5 (five) minutes as needed for chest pain.      . silodosin (RAPAFLO) 8 MG CAPS capsule Take 1 capsule (8 mg total) by mouth daily with breakfast.  90 capsule  3  . warfarin (COUMADIN) 2.5 MG tablet Take 2.5-3.75 mg by mouth See admin instructions. Take one (1) tablet by mouth daily except take 1.5 tablets on Mondays and Thursdays.       No current facility-administered medications on file prior to visit.    BP 120/60  Pulse 61  Temp(Src) 97.5 F (36.4 C) (Oral)  Ht 5\' 11"  (1.803 m)  Wt 156 lb 6.4 oz (70.943 kg)  BMI 21.82 kg/m2  SpO2 97%       Objective:   Physical Exam  Constitutional: He appears well-nourished. No distress.  HENT:  Head: Normocephalic.  Cardiovascular:  No lower leg edema noted bilaterally  Musculoskeletal:  Right lower leg non tender over tibia of injured area   Skin:  Abrasion noted over right tibia approximately  3 cm x2cm. Erythematous, nontender on palpation.  Small amount of inflammation, no drainage noted.  Small amount of dried blood noted.            Assessment & Plan:  1. Wound, open, leg, left, initial encounter Patient to cleanse area with Hibiclens daily am use telfa bandage and paper tape cover daily for 4-5 days. Leave open to air at night.  Leave open to air in 6 5 days.  - neomycin-bacitracin-polymyxin (NEOSPORIN) ointment; Apply 1 application topically every 12 (twelve) hours. apply to eye  Dispense: 15 g; Refill: 0  2. Immunization due Patient needed immunization update.  Given in office today. - Tetanus vaccine IM If injection site becomes tender patient may apply warm compresses prn.  Disucssed above with patient and daughter they verbalize understanding.  Patient to call clinic if wound worsens, or not improved, resolved.  Continue currents medications.

## 2014-04-03 ENCOUNTER — Telehealth: Payer: Self-pay

## 2014-04-03 NOTE — Telephone Encounter (Signed)
I called pt's wife back, questioned her and pt about the area. My assessment is that the wound is no worse than what is described in office note from 04/01/14 (his wife agrees) and I think the risk of abx outweigh benefits at this time. Continue current measures and give the abrasion/wound more time to heal.  Signs/symptoms to call or return for were reviewed and pt expressed understanding.

## 2014-04-03 NOTE — Telephone Encounter (Signed)
CAN called to advised that pt was seen last week and told to call back if redness was noted on his leg and medication would be sent to pharmacy.  Pt has seen redness and needs an abx called into the pharmacy.  Pls advise.

## 2014-04-08 ENCOUNTER — Telehealth: Payer: Self-pay | Admitting: *Deleted

## 2014-04-08 NOTE — Telephone Encounter (Signed)
[facility] Triage Call Report Triage Record Num: [id number] Operator: Vaughan Sine Patient Name: [name] Call Date & Time: 04/03/2014 11:45:51AM Patient Phone: [phone] PCP: Unice Cobble Patient Gender: Male PCP Fax : [phone] Patient DOB: [birth year] Practice Name: [facility] Reason for Call: Caller: James/Son-n-Law; PCP: Scarlette Calico (Adults only); CB#: [phone]; Call regarding F/U Laceration, onset 3 weeks. Pt was seen on 8-6, advised to used Hibiclens, cover during the day and leave open during the night and apply Triple antibiotic ointment. Pt is Demented and doesn't always comply w/ meds. Pt was advised to call back if redness developed. Pt had orginally dropped wood on Leg, covered w/ band-aid and didn't mention to daughter/Caretaker, scab developed over wound and was found when Pt was taken to OV. All emergent sxs ruled out per Laceration protocol, see w/n 4 hrs d/t sxs of worsening soft tissue infx. Daughter/Son-n-Law would like to ask MD to call in Abx per PA's advice if sxs worsen. Allergies confirmed in EPIC. Please review w/ Dr Anitra Lauth and f/u w/ Son-n-Law, Pt uses [facility], info in [place]. Protocol(s) Used: Abrasions, Lacerations, Puncture Wounds Recommended Outcome per Protocol: See Provider within 4 hours Reason for Outcome: Any signs and symptoms of worsening soft tissue infection Care Advice: ~ 08/

## 2014-04-12 ENCOUNTER — Telehealth: Payer: Self-pay | Admitting: Internal Medicine

## 2014-04-12 ENCOUNTER — Ambulatory Visit (INDEPENDENT_AMBULATORY_CARE_PROVIDER_SITE_OTHER): Payer: Medicare Other | Admitting: *Deleted

## 2014-04-12 DIAGNOSIS — Z7901 Long term (current) use of anticoagulants: Secondary | ICD-10-CM

## 2014-04-12 DIAGNOSIS — Z8679 Personal history of other diseases of the circulatory system: Secondary | ICD-10-CM

## 2014-04-12 DIAGNOSIS — I4891 Unspecified atrial fibrillation: Secondary | ICD-10-CM

## 2014-04-12 LAB — POCT INR: INR: 3.3

## 2014-04-12 NOTE — Telephone Encounter (Signed)
Patient Information:  Caller Name: Cherly Hensen  Phone: [phone]  Patient: [name], [name]  Gender: Male  DOB: November 14, 1919  Age: 80 Years  PCP: Scarlette Calico (Adults only)  Office Follow Up:  Does the office need to follow up with this patient?: Yes  Instructions For The Office: just wondering if there is anything else she should do about her father; please call back  RN Note:  has a Coumadin check today at 1330  Symptoms  Reason For Call & Symptoms: says dad woke up groggy this am; mom had said he wasn't very responsive and was asleep in his chair; says he didn't sleep well last night and was up reading; daughter says he is coherent; denies any sxs; dad is joking around; answers all questions appropriately; has eaten breakfast; didn't have his hearing aids in when mom tried to wake him; concerned because he had pneumonia recently and said they said to watch for incoherence  Reviewed Health History In EMR: Yes  Reviewed Medications In EMR: Yes  Reviewed Allergies In EMR: Yes  Reviewed Surgeries / Procedures: Yes  Date of Onset of Symptoms: 04/12/2014  Guideline(s) Used:  No Protocol Available - Sick Adult  Disposition Per Guideline:   Discuss with PCP and Callback by Nurse Today  Reason For Disposition Reached:   Nursing judgment  Advice Given:  N/A  Patient Will Follow Care Advice:  YES

## 2014-04-19 ENCOUNTER — Ambulatory Visit: Payer: Medicare Other | Admitting: Podiatry

## 2014-04-26 ENCOUNTER — Ambulatory Visit (INDEPENDENT_AMBULATORY_CARE_PROVIDER_SITE_OTHER): Payer: Medicare Other | Admitting: Podiatry

## 2014-04-26 DIAGNOSIS — M79676 Pain in unspecified toe(s): Secondary | ICD-10-CM

## 2014-04-26 DIAGNOSIS — B351 Tinea unguium: Secondary | ICD-10-CM

## 2014-04-26 DIAGNOSIS — M79609 Pain in unspecified limb: Secondary | ICD-10-CM

## 2014-04-26 NOTE — Progress Notes (Signed)
Patient ID: [name] Amos Sr., male   DOB: [birth year], 80 y.o.   MRN: [id number]  Subjective: This patient presents complaining of painful toenails. His daughter is present  Objective: Orientated x3 white male The toenails are elongated, hypertrophic, incurvated, discolored 6-10  Assessment: Symptomatic onychomycoses 6-10  Plan: Debrided toenails x10 without a bleeding  Reappoint x3 months

## 2014-04-28 ENCOUNTER — Ambulatory Visit (INDEPENDENT_AMBULATORY_CARE_PROVIDER_SITE_OTHER): Payer: Medicare Other | Admitting: *Deleted

## 2014-04-28 DIAGNOSIS — Z7901 Long term (current) use of anticoagulants: Secondary | ICD-10-CM

## 2014-04-28 DIAGNOSIS — I4891 Unspecified atrial fibrillation: Secondary | ICD-10-CM

## 2014-04-28 DIAGNOSIS — Z8679 Personal history of other diseases of the circulatory system: Secondary | ICD-10-CM

## 2014-04-28 LAB — POCT INR: INR: 2.8

## 2014-05-08 ENCOUNTER — Other Ambulatory Visit: Payer: Self-pay | Admitting: Internal Medicine

## 2014-05-08 ENCOUNTER — Telehealth: Payer: Self-pay | Admitting: Physician Assistant

## 2014-05-08 NOTE — Telephone Encounter (Signed)
Daughter called because pt had chest pain during the night and took SL NTG x 2.   Pt symptoms resolved without further intervention. He is resting comfortably and denies SOB. He was a little more active than usual yesterday, but did not have chest pain with activity.  Advised daughter that if patient developed chest pain but it resolved with one nitroglycerin a phone call was needed. Advised her that if symptoms resolved with 2 sublingual nitroglycerin, they should call us. Advised that if 3 nitroglycerin were required, 911 should be called.  Daughter states the patient is not compliant with daily weights but she will try to get him to weigh today. She does not see any edema and he denies shortness of breath. He is compliant with medications and she plus her husband are assisting with this. No other questions or concerns.

## 2014-05-18 ENCOUNTER — Other Ambulatory Visit: Payer: Self-pay | Admitting: *Deleted

## 2014-05-18 MED ORDER — NITROGLYCERIN 0.4 MG SL SUBL: 0.4000 mg | SUBLINGUAL_TABLET | SUBLINGUAL | Status: AC | PRN

## 2014-05-24 ENCOUNTER — Ambulatory Visit (INDEPENDENT_AMBULATORY_CARE_PROVIDER_SITE_OTHER): Payer: Medicare Other

## 2014-05-24 DIAGNOSIS — Z7901 Long term (current) use of anticoagulants: Secondary | ICD-10-CM

## 2014-05-24 DIAGNOSIS — I4891 Unspecified atrial fibrillation: Secondary | ICD-10-CM

## 2014-05-24 DIAGNOSIS — Z8679 Personal history of other diseases of the circulatory system: Secondary | ICD-10-CM

## 2014-05-24 LAB — POCT INR: INR: 2.7

## 2014-06-09 ENCOUNTER — Other Ambulatory Visit: Payer: Self-pay | Admitting: *Deleted

## 2014-06-09 MED ORDER — WARFARIN SODIUM 2.5 MG PO TABS: ORAL_TABLET | ORAL | Status: DC

## 2014-06-21 ENCOUNTER — Ambulatory Visit: Payer: Medicare Other

## 2014-06-28 ENCOUNTER — Ambulatory Visit (INDEPENDENT_AMBULATORY_CARE_PROVIDER_SITE_OTHER): Payer: Medicare Other | Admitting: *Deleted

## 2014-06-28 DIAGNOSIS — I4891 Unspecified atrial fibrillation: Secondary | ICD-10-CM

## 2014-06-28 DIAGNOSIS — Z8679 Personal history of other diseases of the circulatory system: Secondary | ICD-10-CM

## 2014-06-28 DIAGNOSIS — Z7901 Long term (current) use of anticoagulants: Secondary | ICD-10-CM

## 2014-06-28 LAB — POCT INR: INR: 2.9

## 2014-06-29 ENCOUNTER — Other Ambulatory Visit: Payer: Self-pay | Admitting: Internal Medicine

## 2014-07-04 ENCOUNTER — Other Ambulatory Visit: Payer: Self-pay | Admitting: Physician Assistant

## 2014-07-05 ENCOUNTER — Other Ambulatory Visit: Payer: Self-pay

## 2014-07-05 MED ORDER — FENOFIBRATE 54 MG PO TABS: 54.0000 mg | ORAL_TABLET | Freq: Every day | ORAL | Status: DC

## 2014-07-14 ENCOUNTER — Emergency Department (HOSPITAL_COMMUNITY): Payer: Medicare Other

## 2014-07-14 ENCOUNTER — Emergency Department (HOSPITAL_COMMUNITY)
Admission: EM | Admit: 2014-07-14 | Discharge: 2014-07-14 | Disposition: A | Discharge status: Home / Self Care | Payer: Medicare Other | Attending: Emergency Medicine | Admitting: Emergency Medicine

## 2014-07-14 ENCOUNTER — Encounter (HOSPITAL_COMMUNITY): Payer: Self-pay | Admitting: *Deleted

## 2014-07-14 DIAGNOSIS — Z862 Personal history of diseases of the blood and blood-forming organs and certain disorders involving the immune mechanism: Secondary | ICD-10-CM | POA: Diagnosis not present

## 2014-07-14 DIAGNOSIS — I129 Hypertensive chronic kidney disease with stage 1 through stage 4 chronic kidney disease, or unspecified chronic kidney disease: Secondary | ICD-10-CM | POA: Diagnosis not present

## 2014-07-14 DIAGNOSIS — R4182 Altered mental status, unspecified: Secondary | ICD-10-CM

## 2014-07-14 DIAGNOSIS — N184 Chronic kidney disease, stage 4 (severe): Secondary | ICD-10-CM | POA: Insufficient documentation

## 2014-07-14 DIAGNOSIS — Z79899 Other long term (current) drug therapy: Secondary | ICD-10-CM | POA: Diagnosis not present

## 2014-07-14 DIAGNOSIS — E039 Hypothyroidism, unspecified: Secondary | ICD-10-CM | POA: Diagnosis not present

## 2014-07-14 DIAGNOSIS — I4891 Unspecified atrial fibrillation: Secondary | ICD-10-CM | POA: Diagnosis not present

## 2014-07-14 DIAGNOSIS — Z8719 Personal history of other diseases of the digestive system: Secondary | ICD-10-CM | POA: Diagnosis not present

## 2014-07-14 DIAGNOSIS — Z8673 Personal history of transient ischemic attack (TIA), and cerebral infarction without residual deficits: Secondary | ICD-10-CM | POA: Diagnosis not present

## 2014-07-14 DIAGNOSIS — I5042 Chronic combined systolic (congestive) and diastolic (congestive) heart failure: Secondary | ICD-10-CM | POA: Diagnosis not present

## 2014-07-14 DIAGNOSIS — Z951 Presence of aortocoronary bypass graft: Secondary | ICD-10-CM | POA: Insufficient documentation

## 2014-07-14 DIAGNOSIS — Z85828 Personal history of other malignant neoplasm of skin: Secondary | ICD-10-CM | POA: Diagnosis not present

## 2014-07-14 DIAGNOSIS — I2510 Atherosclerotic heart disease of native coronary artery without angina pectoris: Secondary | ICD-10-CM | POA: Diagnosis not present

## 2014-07-14 DIAGNOSIS — Z8739 Personal history of other diseases of the musculoskeletal system and connective tissue: Secondary | ICD-10-CM | POA: Insufficient documentation

## 2014-07-14 LAB — COMPREHENSIVE METABOLIC PANEL
ALK PHOS: 36 U/L — AB (ref 39–117)
ALT: 11 U/L (ref 0–53)
AST: 19 U/L (ref 0–37)
Albumin: 3.8 g/dL (ref 3.5–5.2)
Anion gap: 13 (ref 5–15)
BUN: 66 mg/dL — AB (ref 6–23)
CO2: 25 mEq/L (ref 19–32)
CREATININE: 2.44 mg/dL — AB (ref 0.50–1.35)
Calcium: 8.8 mg/dL (ref 8.4–10.5)
Chloride: 108 mEq/L (ref 96–112)
GFR calc non Af Amer: 21 mL/min — ABNORMAL LOW (ref >90)
GFR, EST AFRICAN AMERICAN: 25 mL/min — AB (ref >90)
GLUCOSE: 97 mg/dL (ref 70–99)
POTASSIUM: 4.2 meq/L (ref 3.7–5.3)
Sodium: 146 mEq/L (ref 137–147)
TOTAL PROTEIN: 7.2 g/dL (ref 6.0–8.3)
Total Bilirubin: 0.6 mg/dL (ref 0.3–1.2)

## 2014-07-14 LAB — URINALYSIS, ROUTINE W REFLEX MICROSCOPIC
BILIRUBIN URINE: NEGATIVE
Glucose, UA: NEGATIVE mg/dL
Ketones, ur: NEGATIVE mg/dL
Leukocytes, UA: NEGATIVE
Nitrite: NEGATIVE
Protein, ur: 30 mg/dL — AB
Specific Gravity, Urine: 1.015 (ref 1.005–1.030)
Urobilinogen, UA: 1 mg/dL (ref 0.0–1.0)
pH: 6 (ref 5.0–8.0)

## 2014-07-14 LAB — CBC
HEMATOCRIT: 32.5 % — AB (ref 39.0–52.0)
HEMOGLOBIN: 10.6 g/dL — AB (ref 13.0–17.0)
MCH: 31 pg (ref 26.0–34.0)
MCHC: 32.6 g/dL (ref 30.0–36.0)
MCV: 95 fL (ref 78.0–100.0)
Platelets: 107 10*3/uL — ABNORMAL LOW (ref 150–400)
RBC: 3.42 MIL/uL — ABNORMAL LOW (ref 4.22–5.81)
RDW: 14.5 % (ref 11.5–15.5)
WBC: 5.3 10*3/uL (ref 4.0–10.5)

## 2014-07-14 LAB — PROTIME-INR
INR: 2.45 — ABNORMAL HIGH (ref 0.00–1.49)
Prothrombin Time: 26.8 seconds — ABNORMAL HIGH (ref 11.6–15.2)

## 2014-07-14 LAB — URINE MICROSCOPIC-ADD ON

## 2014-07-14 LAB — I-STAT TROPONIN, ED: Troponin i, poc: 0.02 ng/mL (ref 0.00–0.08)

## 2014-07-14 LAB — CBG MONITORING, ED: Glucose-Capillary: 85 mg/dL (ref 70–99)

## 2014-07-14 MED ORDER — SODIUM CHLORIDE 0.9 % IV SOLN: INTRAVENOUS | Status: DC

## 2014-07-14 MED ORDER — SODIUM CHLORIDE 0.9 % IV BOLUS (SEPSIS)
250.0000 mL | Freq: Once | INTRAVENOUS | Status: AC
  Administered 2014-07-14: 250 mL via INTRAVENOUS

## 2014-07-14 NOTE — ED Notes (Signed)
To ct

## 2014-07-14 NOTE — ED Notes (Signed)
Patient transported to MRI 

## 2014-07-14 NOTE — ED Provider Notes (Addendum)
CSN: [id number]     Arrival date & time 07/14/14  1009 History   First MD Initiated Contact with Patient 07/14/14 1103     Chief Complaint  Patient presents with  . Altered Mental Status     (Consider location/radiation/quality/duration/timing/severity/associated sxs/prior Treatment) Patient is a 80 y.o. male presenting with altered mental status. The history is provided by the patient and a relative.  Altered Mental Status Presenting symptoms: confusion   Associated symptoms: no abdominal pain, no fever, no headaches, no nausea, no rash and no vomiting   patient brought in by family members. Family member present did not witness the events. Witnessed by his wife.  The patient awoke this morning normal. Had breakfast and then became confused and did not have as close on. Was unable to answer questions. Did not pass out did not fall over. Patient had similar episode of confusion like this and was related to pneumonia.  Past Medical History  Diagnosis Date  . Coronary artery disease     a.  s/p MI;  b. s/p CABG 2007;  c. cath 12/08: LM occluded, pRCA 80%, dRCA 50%, PLB 90%, S-PDA ok, S-Dx ok with occluded dist Dx; L-LAD ok, S-OM ok with 90% after graft insertion . . . . distal OM treated with DES  . Ischemic cardiomyopathy     echo 6/12 EF 35-40%, apical, dAnt, mid to dist inf-lat HK, mild AI, mod MR, mod BAE, PASP 44  . Chronic combined systolic and diastolic heart failure     admx 8/13;   . Hypertension   . Atrial fibrillation     a. on coumadin.  . CKD (chronic kidney disease), stage IV   . CVA (cerebral infarction)     left frontal  . Anemia     chronic  . Hypothyroidism   . Leg muscle spasm   . GERD (gastroesophageal reflux disease)   . Skin cancer    Past Surgical History  Procedure Laterality Date  . Coronary artery bypass graft  2007  . Coronary angioplasty with stent placement     Family History  Problem Relation Age of Onset  . Cancer Mother   . Stroke Father     History  Substance Use Topics  . Smoking status: Never Smoker   . Smokeless tobacco: Never Used  . Alcohol Use: No    Review of Systems  Constitutional: Negative for fever.  HENT: Negative for congestion.   Eyes: Negative for visual disturbance.  Respiratory: Negative for shortness of breath.   Cardiovascular: Negative for chest pain.  Gastrointestinal: Negative for nausea, vomiting and abdominal pain.  Genitourinary: Negative for dysuria.  Musculoskeletal: Negative for back pain.  Skin: Negative for rash and wound.  Neurological: Negative for headaches.  Hematological: Bruises/bleeds easily.  Psychiatric/Behavioral: Positive for confusion.      Allergies  Ace inhibitors  Home Medications   Prior to Admission medications   Medication Sig Start Date End Date Taking? Authorizing Provider  carvedilol (COREG) 3.125 MG tablet Take 1 tablet (3.125 mg total) by mouth 2 (two) times daily with a meal. 03/12/14  Yes Minus Breeding, MD  Dietary Management Product (VASCULERA) TABS TAKE 1 TABLET BY MOUTH DAILY 06/30/14  Yes Janith Lima, MD  fenofibrate 54 MG tablet Take 1 tablet (54 mg total) by mouth daily. 07/05/14  Yes Minus Breeding, MD  furosemide (LASIX) 20 MG tablet TAKE 2 TABLETS BY MOUTH DAILY 07/05/14  Yes Minus Breeding, MD  levothyroxine (SYNTHROID, LEVOTHROID) 25 MCG tablet  Take 25 mcg by mouth daily before breakfast.   Yes Historical Provider, MD  nitroGLYCERIN (NITROSTAT) 0.4 MG SL tablet Place 1 tablet (0.4 mg total) under the tongue every 5 (five) minutes as needed for chest pain. 05/18/14  Yes Minus Breeding, MD  RAPAFLO 8 MG CAPS capsule TAKE 1 CAPLET BY MOUTH DAILY WITH BREAKFAST. 05/08/14  Yes Janith Lima, MD  neomycin-bacitracin-polymyxin (NEOSPORIN) ointment Apply 1 application topically every 12 (twelve) hours. apply to eye Patient not taking: Reported on 07/14/2014 04/01/14   Carmelina Paddock, NP  warfarin (COUMADIN) 2.5 MG tablet Take as directed by Coumadin  clinic Patient taking differently: Take 2.5-3.75 mg by mouth daily. Take 3.75 mg on Monday and Thursday and 2.5 mg all other days 06/09/14   Minus Breeding, MD   BP 134/53 mmHg  Pulse 67  Temp(Src) 97.5 F (36.4 C) (Oral)  Resp 20  Ht 5\' 11"  (1.803 m)  Wt 158 lb (71.668 kg)  BMI 22.05 kg/m2  SpO2 99% Physical Exam  Constitutional: He appears well-developed and well-nourished. No distress.  HENT:  Head: Normocephalic and atraumatic.  Mouth/Throat: Oropharynx is clear and moist.  Eyes: Conjunctivae and EOM are normal. Pupils are equal, round, and reactive to light.  Neck: Normal range of motion. Neck supple.  Cardiovascular: Normal rate and normal heart sounds.   No murmur heard. Pulmonary/Chest: Effort normal and breath sounds normal.  Abdominal: Soft. Bowel sounds are normal. There is no tenderness.  Musculoskeletal: He exhibits no edema.  Neurological: He is alert.  Skin: Skin is warm. No rash noted.  Old bruising to left arm.  Nursing note and vitals reviewed.   ED Course  Procedures (including critical care time) Labs Review Labs Reviewed  CBC - Abnormal; Notable for the following:    RBC 3.42 (*)    Hemoglobin 10.6 (*)    HCT 32.5 (*)    Platelets 107 (*)    All other components within normal limits  COMPREHENSIVE METABOLIC PANEL - Abnormal; Notable for the following:    BUN 66 (*)    Creatinine, Ser 2.44 (*)    Alkaline Phosphatase 36 (*)    GFR calc non Af Amer 21 (*)    GFR calc Af Amer 25 (*)    All other components within normal limits  URINALYSIS, ROUTINE W REFLEX MICROSCOPIC - Abnormal; Notable for the following:    Hgb urine dipstick SMALL (*)    Protein, ur 30 (*)    All other components within normal limits  PROTIME-INR - Abnormal; Notable for the following:    Prothrombin Time 26.8 (*)    INR 2.45 (*)    All other components within normal limits  URINE MICROSCOPIC-ADD ON  CBG MONITORING, ED  I-STAT TROPOININ, ED    Results for orders placed  or performed during the hospital encounter of 07/14/14  CBC  Result Value Ref Range   WBC 5.3 4.0 - 10.5 K/uL   RBC 3.42 (L) 4.22 - 5.81 MIL/uL   Hemoglobin 10.6 (L) 13.0 - 17.0 g/dL   HCT 32.5 (L) 39.0 - 52.0 %   MCV 95.0 78.0 - 100.0 fL   MCH 31.0 26.0 - 34.0 pg   MCHC 32.6 30.0 - 36.0 g/dL   RDW 14.5 11.5 - 15.5 %   Platelets 107 (L) 150 - 400 K/uL  Comprehensive metabolic panel  Result Value Ref Range   Sodium 146 137 - 147 mEq/L   Potassium 4.2 3.7 - 5.3 mEq/L   Chloride  108 96 - 112 mEq/L   CO2 25 19 - 32 mEq/L   Glucose, Bld 97 70 - 99 mg/dL   BUN 66 (H) 6 - 23 mg/dL   Creatinine, Ser 2.44 (H) 0.50 - 1.35 mg/dL   Calcium 8.8 8.4 - 10.5 mg/dL   Total Protein 7.2 6.0 - 8.3 g/dL   Albumin 3.8 3.5 - 5.2 g/dL   AST 19 0 - 37 U/L   ALT 11 0 - 53 U/L   Alkaline Phosphatase 36 (L) 39 - 117 U/L   Total Bilirubin 0.6 0.3 - 1.2 mg/dL   GFR calc non Af Amer 21 (L) >90 mL/min   GFR calc Af Amer 25 (L) >90 mL/min   Anion gap 13 5 - 15  Urinalysis, Routine w reflex microscopic  Result Value Ref Range   Color, Urine YELLOW YELLOW   APPearance CLEAR CLEAR   Specific Gravity, Urine 1.015 1.005 - 1.030   pH 6.0 5.0 - 8.0   Glucose, UA NEGATIVE NEGATIVE mg/dL   Hgb urine dipstick SMALL (A) NEGATIVE   Bilirubin Urine NEGATIVE NEGATIVE   Ketones, ur NEGATIVE NEGATIVE mg/dL   Protein, ur 30 (A) NEGATIVE mg/dL   Urobilinogen, UA 1.0 0.0 - 1.0 mg/dL   Nitrite NEGATIVE NEGATIVE   Leukocytes, UA NEGATIVE NEGATIVE  Protime-INR  Result Value Ref Range   Prothrombin Time 26.8 (H) 11.6 - 15.2 seconds   INR 2.45 (H) 0.00 - 1.49  Urine microscopic-add on  Result Value Ref Range   Squamous Epithelial / LPF RARE RARE   RBC / HPF 0-2 <3 RBC/hpf  CBG monitoring, ED  Result Value Ref Range   Glucose-Capillary 85 70 - 99 mg/dL  I-Stat Troponin, ED (not at [facility])  Result Value Ref Range   Troponin i, poc 0.02 0.00 - 0.08 ng/mL   Comment 3            Imaging Review Dg Chest 2  View  07/14/2014   CLINICAL DATA:  Altered mental status since this morning. History of ischemic cardiomyopathy and hypertension.  EXAM: CHEST  2 VIEW  COMPARISON:  03/06/2014  FINDINGS: The cardiac silhouette, mediastinal and hilar contours are within normal limits. There is tortuosity and calcification of the thoracic aorta. There are chronic emphysematous lung changes but no acute pulmonary findings. The right middle lobe infiltrate seen on the prior study has resolved. The bony thorax is stable. Multiple remote thoracic compression fractures are noted.  IMPRESSION: Chronic lung changes but no acute pulmonary findings.   Electronically Signed   By: Kalman Jewels M.D.   On: 07/14/2014 11:10   Ct Head Wo Contrast  07/14/2014   CLINICAL DATA:  Altered mental status  EXAM: CT HEAD WITHOUT CONTRAST  TECHNIQUE: Contiguous axial images were obtained from the base of the skull through the vertex without intravenous contrast.  COMPARISON:  01/12/2014  FINDINGS: No evidence of parenchymal hemorrhage or extra-axial fluid collection. No mass lesion, mass effect, or midline shift.  No CT evidence of acute infarction.  Encephalomalacic changes related to prior left frontal infarct.  Subcortical white matter and periventricular small vessel ischemic changes. Intracranial atherosclerosis.  Age related atrophy.  No ventriculomegaly.  The visualized paranasal sinuses are essentially clear. The mastoid air cells are unopacified.  No evidence of calvarial fracture.  IMPRESSION: No evidence of acute intracranial abnormality.  Old left frontal infarct.  Atrophy with small vessel ischemic changes and intracranial atherosclerosis.   Electronically Signed   By: Henderson Newcomer.D.  On: 07/14/2014 13:00     EKG Interpretation None      MDM   Final diagnoses:  Altered mental status, unspecified altered mental status type   Patient with an episode of confusion this morning sounds like it lasted for an uncertain  period of time but was not present when he first woke up. Patient seems to be back to baseline now according to family. Workup so far without evidence of pneumonia urinary tract infection or significant electrolyte abnormalities. CT scan of the head without significant abnormalities. Patient had this happen one time before and was related to pneumonia. MRI of the brain is pending. Patient is on Coumadin and is appropriately anticoagulated.  EKG had no significant arrhythmias. No fever here.   MRI is still pending. If MRI is negative. Patient remained stable as he has so far patient can be discharged back home with follow-up with his primary care doctor.   Fredia Sorrow, MD 07/14/14 [place], MD 07/14/14 1622

## 2014-07-14 NOTE — Discharge Instructions (Signed)
Return for any new or worse symptoms. Make up limit follow-up the with his regular doctor. Today's workup without significant findings.

## 2014-07-14 NOTE — ED Notes (Signed)
MRI will be ready for [name] around 6-6:30pm. [name] and daughter informed of delay

## 2014-07-14 NOTE — ED Notes (Signed)
Pt reports feeling good when he woke up this am. Ate breakfast and then reports an episode of "hearing people asking crazy questions but pt was unable to answer." symptoms resolved after just minutes and pt reports feeling fine at this time, no neuro deficits noted, ekg done at triage.

## 2014-07-19 ENCOUNTER — Telehealth: Payer: Self-pay | Admitting: Internal Medicine

## 2014-07-19 NOTE — Telephone Encounter (Signed)
Please advise 

## 2014-07-19 NOTE — Telephone Encounter (Signed)
Laurie was notified.

## 2014-07-19 NOTE — Telephone Encounter (Signed)
[name], Coyt's daughter called to let Dr Ronnald Ramp know that he was in [facility] ER for 10 hrs 07/14/14 and had MRI, lab work, chest xray- everything was fine (per daughter) and pt went home. They are inquiring if he needs to follow up with you?

## 2014-07-19 NOTE — Telephone Encounter (Signed)
No, no follow up needed

## 2014-07-26 ENCOUNTER — Ambulatory Visit (INDEPENDENT_AMBULATORY_CARE_PROVIDER_SITE_OTHER): Payer: Medicare Other | Admitting: Podiatry

## 2014-07-26 ENCOUNTER — Encounter: Payer: Self-pay | Admitting: Podiatry

## 2014-07-26 ENCOUNTER — Ambulatory Visit (INDEPENDENT_AMBULATORY_CARE_PROVIDER_SITE_OTHER): Payer: Medicare Other | Admitting: Pharmacist

## 2014-07-26 DIAGNOSIS — I4891 Unspecified atrial fibrillation: Secondary | ICD-10-CM

## 2014-07-26 DIAGNOSIS — B351 Tinea unguium: Secondary | ICD-10-CM

## 2014-07-26 DIAGNOSIS — Z8679 Personal history of other diseases of the circulatory system: Secondary | ICD-10-CM

## 2014-07-26 DIAGNOSIS — Z7901 Long term (current) use of anticoagulants: Secondary | ICD-10-CM

## 2014-07-26 DIAGNOSIS — M79676 Pain in unspecified toe(s): Secondary | ICD-10-CM

## 2014-07-26 LAB — POCT INR: INR: 2

## 2014-07-26 NOTE — Patient Instructions (Signed)
Remove Band-Aid on fourth right toe in 48 hours and apply topical antibiotic ointment and Band-Aid to the fourth right toe daily until healed

## 2014-07-27 ENCOUNTER — Ambulatory Visit (INDEPENDENT_AMBULATORY_CARE_PROVIDER_SITE_OTHER): Payer: Medicare Other | Admitting: *Deleted

## 2014-07-27 DIAGNOSIS — Z23 Encounter for immunization: Secondary | ICD-10-CM

## 2014-07-27 NOTE — Progress Notes (Signed)
Patient ID: [name] Amos Sr., male   DOB: 06-08-1920, 80 y.o.   MRN: [id number]  Subjective: This patient presents complaining of painful toenails  Objective: Orientated 3 hearing impaired patient presents with his daughter  The toenails are elongated, hypertrophic, discolored, incurvated, 6-10  Assessment: Symptomatic onychomycoses 6-10  Plan: Debrided toenails 10 with slight bleeding fourth right toe. Antibiotic ointment dressing applied to the fourth right toe Advised patient we Band-Aid on 48 hours and apply topical antibiotic ointment fourth right toe until healed  Reappoint 3 months

## 2014-08-10 ENCOUNTER — Encounter: Payer: Self-pay | Admitting: Cardiology

## 2014-08-10 ENCOUNTER — Telehealth: Payer: Self-pay | Admitting: Cardiology

## 2014-08-10 ENCOUNTER — Ambulatory Visit (INDEPENDENT_AMBULATORY_CARE_PROVIDER_SITE_OTHER): Payer: Medicare Other | Admitting: Cardiology

## 2014-08-10 VITALS — BP 100/60 | HR 78 | Ht 71.0 in | Wt 164.0 lb

## 2014-08-10 DIAGNOSIS — I482 Chronic atrial fibrillation, unspecified: Secondary | ICD-10-CM

## 2014-08-10 NOTE — Addendum Note (Signed)
Addended by: Leanora Cover C. on: 08/10/2014 03:00 PM   Modules accepted: Orders

## 2014-08-10 NOTE — Telephone Encounter (Signed)
Pt. To be here at 1:45 today

## 2014-08-10 NOTE — Progress Notes (Signed)
HPI The patient presents for followup of his cardiomyopathy. Since I last saw him he was  In the emergency room with an episode of confusion that was unexplained. MRI demonstrated some atrophy. I reviewed these records.   He called today and reported some episodes of feeling like he been shocked though he doesn't have an ICD. His head to his episode that woke him from his sleep. He wondered it could be his heart rhythm but he is never had any evident when he was awake and he's not had any presyncope or syncope.  I did review  recemt labs and they were stable with some stable renal insufficiency. He since denies any cardiovascular complaints. He denies any chest pressure, neck or arm discomfort. He has had no new shortness of breath, PND or orthopnea. He has had no weight gain or edema.   Allergies  Allergen Reactions  . Ace Inhibitors     Cannot remember the reaction    Current Outpatient Prescriptions on File Prior to Visit  Medication Sig Dispense Refill  . carvedilol (COREG) 3.125 MG tablet Take 1 tablet (3.125 mg total) by mouth 2 (two) times daily with a meal. 60 tablet 5  . Dietary Management Product (VASCULERA) TABS TAKE 1 TABLET BY MOUTH DAILY 90 tablet 3  . fenofibrate 54 MG tablet Take 1 tablet (54 mg total) by mouth daily. 30 tablet 1  . furosemide (LASIX) 20 MG tablet TAKE 2 TABLETS BY MOUTH DAILY 60 tablet 3  . levothyroxine (SYNTHROID, LEVOTHROID) 25 MCG tablet Take 25 mcg by mouth daily before breakfast.    . neomycin-bacitracin-polymyxin (NEOSPORIN) ointment Apply 1 application topically every 12 (twelve) hours. apply to eye 15 g 0  . nitroGLYCERIN (NITROSTAT) 0.4 MG SL tablet Place 1 tablet (0.4 mg total) under the tongue every 5 (five) minutes as needed for chest pain. 25 tablet 1  . RAPAFLO 8 MG CAPS capsule TAKE 1 CAPLET BY MOUTH DAILY WITH BREAKFAST. 90 capsule 3  . warfarin (COUMADIN) 2.5 MG tablet Take as directed by Coumadin clinic (Patient taking differently: Take  2.5-3.75 mg by mouth daily. Take 3.75 mg on Monday and Thursday and 2.5 mg all other days) 40 tablet 3   No current facility-administered medications on file prior to visit.    Past Medical History  Diagnosis Date  . Coronary artery disease     a.  s/p MI;  b. s/p CABG 2007;  c. cath 12/08: LM occluded, pRCA 80%, dRCA 50%, PLB 90%, S-PDA ok, S-Dx ok with occluded dist Dx; L-LAD ok, S-OM ok with 90% after graft insertion . . . . distal OM treated with DES  . Ischemic cardiomyopathy     echo 6/12 EF 35-40%, apical, dAnt, mid to dist inf-lat HK, mild AI, mod MR, mod BAE, PASP 44  . Chronic combined systolic and diastolic heart failure     admx 8/13;   . Hypertension   . Atrial fibrillation     a. on coumadin.  . CKD (chronic kidney disease), stage IV   . CVA (cerebral infarction)     left frontal  . Anemia     chronic  . Hypothyroidism   . Leg muscle spasm   . GERD (gastroesophageal reflux disease)   . Skin cancer     Past Surgical History  Procedure Laterality Date  . Coronary artery bypass graft  2007  . Coronary angioplasty with stent placement      ROS  As stated in the HPI  and negative for all other systems.  PHYSICAL EXAM:   BP 100/60 mmHg  Pulse 78  Ht 5\' 11"  (1.803 m)  Wt 164 lb (74.39 kg)  BMI 22.88 kg/m2 GENERAL:  Frail-appearing NECK:  JVD at 45 degrees to the jaw at 45 degrees, waveform within normal limits, carotid upstroke brisk and symmetric, no bruits, no thyromegaly LUNGS:  Clear to auscultation bilaterally BACK:  No CVA tenderness CHEST:  Healed sternotomy scar HEART:  PMI not displaced or sustained,,S1 and S2 within normal limits, no S3, no clicks, no rubs, no murmurs, irregluar ABD:  Flat, positive bowel sounds normal in frequency in pitch, no bruits, no rebound, no guarding, no midline pulsatile mass, no hepatomegaly, no splenomegaly EXT:  2 plus pulses throughout, mild edema, no cyanosis no clubbing, mild edema  EKG:   Atrial fibrillation, rate 78  , right bundle branch block , premature ventricular contractions, nonspecific T-wave changes.  ASSESSMENT:  ISCHEMIC CARDIOMYOPATHY -  He seems to have be euvolemic. At this point he will continue meds as listed. I  CAD -  The patient has no new sypmtoms. No further cardiovascular testing is indicated. We will continue with aggressive risk reduction and meds as listed.   Atrial fibrillation -  The patient tolerates this rhythm with rate control and anticoagulation.  His stroke risk is 9% per year.  He will continue with warfarin.  However, because of the feelings of "electric shock" I will place an event recorder.   Oluwatobi G Veach Sr. will need a 21 day event monitor.  The patients symptoms necessitate an event monitor.  The symptoms are too infrequent to be identified on a Holter monitor.

## 2014-08-10 NOTE — Telephone Encounter (Signed)
New Message         Pt's daughter calling stating that pt is c/o feeling an electrical shock at around 2 or 3am. Wanting to know if pt needs to come to our office or go to the ER. Please call back and advise.

## 2014-08-10 NOTE — Patient Instructions (Signed)
Your physician recommends that you schedule a follow-up appointment in: after your  21 day event monitor is finished

## 2014-08-11 ENCOUNTER — Telehealth: Payer: Self-pay | Admitting: Cardiology

## 2014-08-11 NOTE — Telephone Encounter (Signed)
New message      Pt got a monitor yesterday.  Daughter has a question on whether the monitor is activated or will she need to activate it.  Please call

## 2014-08-11 NOTE — Telephone Encounter (Signed)
error 

## 2014-08-16 ENCOUNTER — Ambulatory Visit: Payer: Medicare Other | Admitting: Cardiology

## 2014-08-23 ENCOUNTER — Ambulatory Visit (INDEPENDENT_AMBULATORY_CARE_PROVIDER_SITE_OTHER): Payer: Medicare Other

## 2014-08-23 DIAGNOSIS — Z7901 Long term (current) use of anticoagulants: Secondary | ICD-10-CM

## 2014-08-23 DIAGNOSIS — Z8679 Personal history of other diseases of the circulatory system: Secondary | ICD-10-CM

## 2014-08-23 DIAGNOSIS — I4891 Unspecified atrial fibrillation: Secondary | ICD-10-CM

## 2014-08-23 LAB — POCT INR: INR: 2.1

## 2014-09-01 ENCOUNTER — Encounter: Payer: Self-pay | Admitting: Family

## 2014-09-01 ENCOUNTER — Ambulatory Visit (INDEPENDENT_AMBULATORY_CARE_PROVIDER_SITE_OTHER): Payer: Medicare Other | Admitting: Family

## 2014-09-01 VITALS — BP 114/58 | Resp 16 | Ht 71.0 in | Wt 166.0 lb

## 2014-09-01 DIAGNOSIS — S81802A Unspecified open wound, left lower leg, initial encounter: Secondary | ICD-10-CM | POA: Insufficient documentation

## 2014-09-01 MED ORDER — CEPHALEXIN 500 MG PO CAPS: 500.0000 mg | ORAL_CAPSULE | Freq: Two times a day (BID) | ORAL | Status: DC

## 2014-09-01 NOTE — Progress Notes (Signed)
Pre visit review using our clinic review tool, if applicable. No additional management support is needed unless otherwise documented below in the visit note. 

## 2014-09-01 NOTE — Patient Instructions (Signed)
Thank you for choosing [facility].  Summary/Instructions:  Your prescription(s) have been submitted to your pharmacy or been printed and provided for you. Please take as directed and contact our office if you believe you are having problem(s) with the medication(s) or have any questions.  If your symptoms worsen or fail to improve, please contact our office for further instruction, or in case of emergency go directly to the emergency room at the closest medical facility.   Wound Care Wound care helps prevent pain and infection.  You may need a tetanus shot if:  You cannot remember when you had your last tetanus shot.  You have never had a tetanus shot.  The injury broke your skin. If you need a tetanus shot and you choose not to have one, you may get tetanus. Sickness from tetanus can be serious. HOME CARE   Only take medicine as told by your doctor.  Clean the wound daily with mild soap and water.  Change any bandages (dressings) as told by your doctor.  Put medicated cream and a bandage on the wound as told by your doctor.  Change the bandage if it gets wet, dirty, or starts to smell.  Take showers. Do not take baths, swim, or do anything that puts your wound under water.  Rest and raise (elevate) the wound until the pain and puffiness (swelling) are better.  Keep all doctor visits as told. GET HELP RIGHT AWAY IF:   Yellowish-white fluid (pus) comes from the wound.  Medicine does not lessen your pain.  There is a red streak going away from the wound.  You have a fever. MAKE SURE YOU:   Understand these instructions.  Will watch your condition.  Will get help right away if you are not doing well or get worse. Document Released: 05/22/2008 Document Revised: 11/05/2011 Document Reviewed: 12/17/2010 [facility] Patient Information 2015 [place], [place]. This information is not intended to replace advice given to you by your health care provider. Make sure you  discuss any questions you have with your health care provider.

## 2014-09-01 NOTE — Assessment & Plan Note (Signed)
Wound consistent with a ruptured blister with slightly delayed healing. Wound cleaned with soap and water and office. Continue general wound care with soap and water, and may also use triple antibiotic cream as needed. With leg appearing slightly reddened, start Keflex 5 days. Tensional for wound to be delayed healing with venous stasis history. Follow-up if symptoms worsen or fail to improve.

## 2014-09-01 NOTE — Progress Notes (Signed)
   Subjective:    Patient ID: [name] Amos Sr., male    DOB: 1920/01/22, 81 y.o.   MRN: [id number]  Chief Complaint  Patient presents with  . Wound    wound on right ankle that started as a blister that popped, there is puss and alot of redness in the area, x1 week      HPI:  [name] G Varano Sr. is a 81 y.o. male who presents today for an acute visit.  Acute symptoms of a blister on his right ankle that recently popped  is described as having puss and a lot of redness around the area.  This was first noticed about 1 week ago after wearing socks. This has worsened over the course of the last week. Has been cleaning with soap and water and is now wearing medicated band-aids. Concerned for possible infection.    Allergies  Allergen Reactions  . Ace Inhibitors     Cannot remember the reaction    Current Outpatient Prescriptions on File Prior to Visit  Medication Sig Dispense Refill  . carvedilol (COREG) 3.125 MG tablet Take 1 tablet (3.125 mg total) by mouth 2 (two) times daily with a meal. 60 tablet 5  . Dietary Management Product (VASCULERA) TABS TAKE 1 TABLET BY MOUTH DAILY 90 tablet 3  . fenofibrate 54 MG tablet Take 1 tablet (54 mg total) by mouth daily. 30 tablet 1  . furosemide (LASIX) 20 MG tablet TAKE 2 TABLETS BY MOUTH DAILY 60 tablet 3  . levothyroxine (SYNTHROID, LEVOTHROID) 25 MCG tablet Take 25 mcg by mouth daily before breakfast.    . neomycin-bacitracin-polymyxin (NEOSPORIN) ointment Apply 1 application topically every 12 (twelve) hours. apply to eye 15 g 0  . nitroGLYCERIN (NITROSTAT) 0.4 MG SL tablet Place 1 tablet (0.4 mg total) under the tongue every 5 (five) minutes as needed for chest pain. 25 tablet 1  . RAPAFLO 8 MG CAPS capsule TAKE 1 CAPLET BY MOUTH DAILY WITH BREAKFAST. 90 capsule 3  . warfarin (COUMADIN) 2.5 MG tablet Take as directed by Coumadin clinic (Patient taking differently: Take 2.5-3.75 mg by mouth daily. Take 3.75 mg on Monday and Thursday and 2.5  mg all other days) 40 tablet 3   No current facility-administered medications on file prior to visit.    Review of Systems  Constitutional: Negative for fever and chills.  Skin: Positive for wound (Right medial distal lower extremity).      Objective:    BP 114/58 mmHg  Resp 16  Ht 5\' 11"  (1.803 m)  Wt 166 lb (75.297 kg)  BMI 23.16 kg/m2 Nursing note and vital signs reviewed.  Physical Exam  Constitutional: He is oriented to person, place, and time. He appears well-developed and well-nourished. No distress.  Cardiovascular: Normal rate, regular rhythm, normal heart sounds and intact distal pulses.   Pulmonary/Chest: Effort normal and breath sounds normal.  Neurological: He is alert and oriented to person, place, and time.  Skin: Skin is warm and dry.  Wound with approximately 1 inch diameter noted right distal tibia on the medial aspect. Leg appears generally red. No streaks, warmth, or discharge noted at this time  Psychiatric: He has a normal mood and affect. His behavior is normal. Judgment and thought content normal.       Assessment & Plan:

## 2014-09-06 ENCOUNTER — Ambulatory Visit: Payer: Medicare Other | Admitting: Cardiology

## 2014-09-10 ENCOUNTER — Telehealth: Payer: Self-pay | Admitting: Internal Medicine

## 2014-09-10 NOTE — Telephone Encounter (Signed)
Pt daughter called wanting a call back from the nurse. She has some question about want the sore on pt leg should look like.  They have been watching it

## 2014-09-12 ENCOUNTER — Other Ambulatory Visit: Payer: Self-pay | Admitting: Cardiology

## 2014-09-13 NOTE — Telephone Encounter (Signed)
Called pts daughter. Left VM to call back

## 2014-09-13 NOTE — Telephone Encounter (Signed)
Ok to refill this for the patient, or should it be deferred to pcp? Please advise. Thanks, MI

## 2014-09-13 NOTE — Telephone Encounter (Signed)
Please call patient and let me know if there are any further questions that need management.

## 2014-09-16 ENCOUNTER — Encounter: Payer: Self-pay | Admitting: Cardiology

## 2014-09-16 NOTE — Telephone Encounter (Signed)
Ok to fill it

## 2014-09-22 ENCOUNTER — Encounter: Payer: Self-pay | Admitting: Physician Assistant

## 2014-09-22 ENCOUNTER — Ambulatory Visit (INDEPENDENT_AMBULATORY_CARE_PROVIDER_SITE_OTHER): Payer: Medicare Other | Admitting: Physician Assistant

## 2014-09-22 ENCOUNTER — Ambulatory Visit (INDEPENDENT_AMBULATORY_CARE_PROVIDER_SITE_OTHER): Payer: Medicare Other | Admitting: Pharmacist Clinician (PhC)/ Clinical Pharmacy Specialist

## 2014-09-22 VITALS — BP 111/51 | HR 59 | Ht 71.0 in | Wt 162.7 lb

## 2014-09-22 DIAGNOSIS — I10 Essential (primary) hypertension: Secondary | ICD-10-CM

## 2014-09-22 DIAGNOSIS — Z7901 Long term (current) use of anticoagulants: Secondary | ICD-10-CM

## 2014-09-22 DIAGNOSIS — I5042 Chronic combined systolic (congestive) and diastolic (congestive) heart failure: Secondary | ICD-10-CM

## 2014-09-22 DIAGNOSIS — I4819 Other persistent atrial fibrillation: Secondary | ICD-10-CM

## 2014-09-22 DIAGNOSIS — Z8679 Personal history of other diseases of the circulatory system: Secondary | ICD-10-CM

## 2014-09-22 DIAGNOSIS — I4891 Unspecified atrial fibrillation: Secondary | ICD-10-CM

## 2014-09-22 DIAGNOSIS — I2510 Atherosclerotic heart disease of native coronary artery without angina pectoris: Secondary | ICD-10-CM

## 2014-09-22 DIAGNOSIS — I481 Persistent atrial fibrillation: Secondary | ICD-10-CM

## 2014-09-22 LAB — POCT INR: INR: 2.1

## 2014-09-22 NOTE — Assessment & Plan Note (Signed)
blood pressure well-controlled

## 2014-09-22 NOTE — Progress Notes (Signed)
Patient ID: [name] Amos Sr., male   DOB: 1919-10-10, 81 y.o.   MRN: [id number]    Date:  09/22/2014   ID:  [name] Amos Sr., DOB Sep 08, 1919, MRN [id number]  PCP:  Scarlette Calico, MD  Primary Cardiologist:  [facility]   Chief Complaint  Patient presents with  . Follow-up    wore monitor 09/06/14     History of Present Illness: [name] G Wenner Sr. is a 81 y.o. male  With a history of coronary artery disease, ischemic cardiomyopathy , chronic combined systolic and diastolic heart failure, hypertension, atrial fibrillation for which he takes Coumadin , chronic kidney disease stage IV, CVA , hypothyroidism. He was seen in the emergency room, prior to his last office visit with Dr. Percival Spanish, with an episode of confusion that was unexplained. MRI demonstrated some atrophy. During his  last visit he reported an episode of feeling shocked.   He were a heart rate monitor for 3 weeks which demonstrated persistent atrial fibrillation with controlled ventricular rates. He did have a couple episodes of 4 beat NSVT. Dense today for follow-up. Reports doing quite well and has no complaints. Does have some mild lower extremity edema which is usually better by morning. He tells me that he thinks he may have dreamt that episode of being shocked.  He currently denies nausea, vomiting, fever, chest pain, shortness of breath, orthopnea, dizziness, PND, cough, congestion, abdominal pain, hematochezia, melena, claudication.  Wt Readings from Last 3 Encounters:  09/22/14 162 lb 11.2 oz (73.8 kg)  09/01/14 166 lb (75.297 kg)  08/10/14 164 lb (74.39 kg)     Past Medical History  Diagnosis Date  . Coronary artery disease     a.  s/p MI;  b. s/p CABG 2007;  c. cath 12/08: LM occluded, pRCA 80%, dRCA 50%, PLB 90%, S-PDA ok, S-Dx ok with occluded dist Dx; L-LAD ok, S-OM ok with 90% after graft insertion . . . . distal OM treated with DES  . Ischemic cardiomyopathy     echo 6/12 EF 35-40%, apical, dAnt, mid to dist  inf-lat HK, mild AI, mod MR, mod BAE, PASP 44  . Chronic combined systolic and diastolic heart failure     admx 8/13;   . Hypertension   . Atrial fibrillation     a. on coumadin.  . CKD (chronic kidney disease), stage IV   . CVA (cerebral infarction)     left frontal  . Anemia     chronic  . Hypothyroidism   . Leg muscle spasm   . GERD (gastroesophageal reflux disease)   . Skin cancer     Current Outpatient Prescriptions  Medication Sig Dispense Refill  . carvedilol (COREG) 3.125 MG tablet Take 1 tablet (3.125 mg total) by mouth 2 (two) times daily with a meal. 60 tablet 5  . Dietary Management Product (VASCULERA) TABS TAKE 1 TABLET BY MOUTH DAILY 90 tablet 3  . fenofibrate 54 MG tablet TAKE 1 TABLET BY MOUTH DAILY 30 tablet 5  . furosemide (LASIX) 20 MG tablet TAKE 2 TABLETS BY MOUTH DAILY 60 tablet 3  . levothyroxine (SYNTHROID, LEVOTHROID) 25 MCG tablet Take 25 mcg by mouth daily before breakfast.    . neomycin-bacitracin-polymyxin (NEOSPORIN) ointment Apply 1 application topically every 12 (twelve) hours. apply to eye 15 g 0  . nitroGLYCERIN (NITROSTAT) 0.4 MG SL tablet Place 1 tablet (0.4 mg total) under the tongue every 5 (five) minutes as needed for chest pain. 25 tablet 1  .  RAPAFLO 8 MG CAPS capsule TAKE 1 CAPLET BY MOUTH DAILY WITH BREAKFAST. 90 capsule 3  . warfarin (COUMADIN) 2.5 MG tablet Take as directed by Coumadin clinic (Patient taking differently: Take 2.5-3.75 mg by mouth daily. Take 3.75 mg on Monday and Thursday and 2.5 mg all other days) 40 tablet 3   No current facility-administered medications for this visit.    Allergies:    Allergies  Allergen Reactions  . Ace Inhibitors     Cannot remember the reaction    Social History:  The patient  reports that he has never smoked. He has never used smokeless tobacco. He reports that he does not drink alcohol or use illicit drugs.   Family history:   Family History  Problem Relation Age of Onset  . Cancer  Mother   . Stroke Father     ROS:  Please see the history of present illness.  All other systems reviewed and negative.   PHYSICAL EXAM: VS:  BP 111/51 mmHg  Pulse 59  Ht 5\' 11"  (1.803 m)  Wt 162 lb 11.2 oz (73.8 kg)  BMI 22.70 kg/m2 Well nourished, well developed, in no acute distress HEENT: Pupils are equal round react to light accommodation extraocular movements are intact.  Neck: no JVDNo cervical lymphadenopathy. Cardiac:  irregular rate and rhythm without murmurs rubs or gallops. Lungs:  clear to auscultation bilaterally, no wheezing, rhonchi or rales Abd: soft, nontender, positive bowel sounds all quadrants, no hepatosplenomegaly Ext:  1+lower extremity edema.  2+ radial and dorsalis pedis pulses. Skin: warm and dry Neuro:  Grossly normal     ASSESSMENT AND PLAN:  Problem List Items Addressed This Visit    Long term current use of anticoagulant   Essential hypertension     blood pressure well-controlled      Coronary atherosclerosis     No complaints of angina.      Chronic combined systolic and diastolic heart failure     Patient has some mild lower extremity edema which is often better by morning. Other than that he has no overt signs of heart failure.   continue current Lasix dosing      Atrial fibrillation - Primary     Rate is well controlled. His INR is 2.1 today.   Continue low-dose Coreg

## 2014-09-22 NOTE — Patient Instructions (Signed)
Your physician wants you to follow-up in: 6 months with Dr. Hochrein.  You will receive a reminder letter in the mail two months in advance. If you don't receive a letter, please call our office to schedule the follow-up appointment.  

## 2014-09-22 NOTE — Assessment & Plan Note (Signed)
Patient has some mild lower extremity edema which is often better by morning. Other than that he has no overt signs of heart failure.   continue current Lasix dosing

## 2014-09-22 NOTE — Assessment & Plan Note (Signed)
Rate is well controlled. His INR is 2.1 today.   Continue low-dose Coreg

## 2014-09-22 NOTE — Assessment & Plan Note (Signed)
No complaints of angina. 

## 2014-10-14 ENCOUNTER — Telehealth: Payer: Self-pay | Admitting: Cardiology

## 2014-10-14 NOTE — Telephone Encounter (Signed)
Pt dropped off FMLA at [address] office, this is for Dr. Percival Spanish to sign I have sent interoffice to [facility] office.

## 2014-10-19 ENCOUNTER — Telehealth: Payer: Self-pay | Admitting: Cardiology

## 2014-10-19 NOTE — Telephone Encounter (Signed)
[name] received FMLA paperwork from Dr Percival Spanish on 2/19  She contacted patient and got release and payment to [facility] on 10/18/14.  FMLA package sent to [facility] @ [employer] on 10/19/14 .

## 2014-10-22 ENCOUNTER — Other Ambulatory Visit: Payer: Self-pay

## 2014-10-22 MED ORDER — CARVEDILOL 3.125 MG PO TABS: 3.1250 mg | ORAL_TABLET | Freq: Two times a day (BID) | ORAL | Status: AC

## 2014-10-25 ENCOUNTER — Ambulatory Visit (INDEPENDENT_AMBULATORY_CARE_PROVIDER_SITE_OTHER): Payer: Medicare Other | Admitting: *Deleted

## 2014-10-25 ENCOUNTER — Encounter: Payer: Self-pay | Admitting: Podiatry

## 2014-10-25 ENCOUNTER — Ambulatory Visit (INDEPENDENT_AMBULATORY_CARE_PROVIDER_SITE_OTHER): Payer: Medicare Other | Admitting: Podiatry

## 2014-10-25 DIAGNOSIS — B351 Tinea unguium: Secondary | ICD-10-CM

## 2014-10-25 DIAGNOSIS — M79676 Pain in unspecified toe(s): Secondary | ICD-10-CM | POA: Diagnosis not present

## 2014-10-25 DIAGNOSIS — Z8679 Personal history of other diseases of the circulatory system: Secondary | ICD-10-CM

## 2014-10-25 DIAGNOSIS — I4891 Unspecified atrial fibrillation: Secondary | ICD-10-CM

## 2014-10-25 DIAGNOSIS — Z7901 Long term (current) use of anticoagulants: Secondary | ICD-10-CM

## 2014-10-25 DIAGNOSIS — I4819 Other persistent atrial fibrillation: Secondary | ICD-10-CM

## 2014-10-25 DIAGNOSIS — I481 Persistent atrial fibrillation: Secondary | ICD-10-CM

## 2014-10-25 LAB — POCT INR: INR: 2.1

## 2014-10-26 ENCOUNTER — Other Ambulatory Visit: Payer: Self-pay | Admitting: Cardiology

## 2014-10-26 NOTE — Progress Notes (Signed)
Patient ID: [name] Amos Sr., male   DOB: [birth year], 81 y.o.   MRN: [id number]  Subjective: Patient presents today complaining of painful toenails and request debridement  Objective: The toenails are hypertrophic, elongated, discolored and tender to palpation 6-10  Assessment: Symptomatic onychomycoses 6-10  Plan: Debridement of toenails 10 without any bleeding  Reappoint 3 months

## 2014-10-28 ENCOUNTER — Ambulatory Visit: Payer: Medicare Other | Admitting: Internal Medicine

## 2014-11-03 NOTE — Telephone Encounter (Signed)
3.9.16 Received FMLA forms back from [employer] @ [employer].  Given to [place], RN for Dr Percival Spanish to review and sign.  Called Patient daughter, Suan Halter and [facility] that we have her paperwork back from [employer] at the [place] office.  Also advised Dr Percival Spanish not in office until 3/14.

## 2014-11-09 ENCOUNTER — Telehealth: Payer: Self-pay

## 2014-11-09 NOTE — Telephone Encounter (Signed)
PA submitted via covermymed on 11/02/14. I called insurance company at [phone] and [email or web address] to check status.

## 2014-11-14 ENCOUNTER — Other Ambulatory Visit: Payer: Self-pay | Admitting: Cardiology

## 2014-11-19 ENCOUNTER — Telehealth: Payer: Self-pay | Admitting: Cardiology

## 2014-11-19 NOTE — Telephone Encounter (Signed)
Received signed [place] form back from Dr Percival Spanish.  Mailed to [employer] 11/18/14.  lp

## 2014-12-07 ENCOUNTER — Ambulatory Visit (INDEPENDENT_AMBULATORY_CARE_PROVIDER_SITE_OTHER): Payer: Medicare Other

## 2014-12-07 DIAGNOSIS — Z8679 Personal history of other diseases of the circulatory system: Secondary | ICD-10-CM | POA: Diagnosis not present

## 2014-12-07 DIAGNOSIS — I4891 Unspecified atrial fibrillation: Secondary | ICD-10-CM

## 2014-12-07 DIAGNOSIS — Z7901 Long term (current) use of anticoagulants: Secondary | ICD-10-CM

## 2014-12-07 LAB — POCT INR: INR: 1.8

## 2014-12-09 ENCOUNTER — Telehealth: Payer: Self-pay | Admitting: Cardiology

## 2014-12-09 NOTE — Telephone Encounter (Signed)
FMLA paper are still not right,her husband will bring them up here today.There is a letter that will be with the FMLA papers to tell you what you need to complete.   She needs them back asap,please mail them  back to her employer.Marland Kitchen

## 2014-12-09 NOTE — Telephone Encounter (Signed)
FORWARD TO Hilda Blades MATHIS RN

## 2014-12-10 ENCOUNTER — Encounter (HOSPITAL_COMMUNITY): Payer: Self-pay | Admitting: Emergency Medicine

## 2014-12-10 ENCOUNTER — Emergency Department (HOSPITAL_COMMUNITY): Payer: Medicare Other

## 2014-12-10 ENCOUNTER — Inpatient Hospital Stay (HOSPITAL_COMMUNITY)
Admission: EM | Admit: 2014-12-10 | Discharge: 2014-12-15 | DRG: 481 | Disposition: A | Discharge status: Skilled Nursing Facility | Payer: Medicare Other | Attending: Internal Medicine | Admitting: Internal Medicine

## 2014-12-10 ENCOUNTER — Inpatient Hospital Stay (HOSPITAL_COMMUNITY): Payer: Medicare Other

## 2014-12-10 DIAGNOSIS — I129 Hypertensive chronic kidney disease with stage 1 through stage 4 chronic kidney disease, or unspecified chronic kidney disease: Secondary | ICD-10-CM | POA: Diagnosis present

## 2014-12-10 DIAGNOSIS — D696 Thrombocytopenia, unspecified: Secondary | ICD-10-CM | POA: Diagnosis present

## 2014-12-10 DIAGNOSIS — Z09 Encounter for follow-up examination after completed treatment for conditions other than malignant neoplasm: Secondary | ICD-10-CM

## 2014-12-10 DIAGNOSIS — Z888 Allergy status to other drugs, medicaments and biological substances status: Secondary | ICD-10-CM | POA: Diagnosis not present

## 2014-12-10 DIAGNOSIS — D62 Acute posthemorrhagic anemia: Secondary | ICD-10-CM | POA: Insufficient documentation

## 2014-12-10 DIAGNOSIS — H9190 Unspecified hearing loss, unspecified ear: Secondary | ICD-10-CM | POA: Diagnosis present

## 2014-12-10 DIAGNOSIS — S7290XA Unspecified fracture of unspecified femur, initial encounter for closed fracture: Secondary | ICD-10-CM | POA: Diagnosis present

## 2014-12-10 DIAGNOSIS — R54 Age-related physical debility: Secondary | ICD-10-CM | POA: Diagnosis present

## 2014-12-10 DIAGNOSIS — N184 Chronic kidney disease, stage 4 (severe): Secondary | ICD-10-CM | POA: Diagnosis present

## 2014-12-10 DIAGNOSIS — Z8673 Personal history of transient ischemic attack (TIA), and cerebral infarction without residual deficits: Secondary | ICD-10-CM | POA: Diagnosis not present

## 2014-12-10 DIAGNOSIS — R778 Other specified abnormalities of plasma proteins: Secondary | ICD-10-CM

## 2014-12-10 DIAGNOSIS — I252 Old myocardial infarction: Secondary | ICD-10-CM | POA: Diagnosis not present

## 2014-12-10 DIAGNOSIS — N400 Enlarged prostate without lower urinary tract symptoms: Secondary | ICD-10-CM | POA: Diagnosis present

## 2014-12-10 DIAGNOSIS — M25551 Pain in right hip: Secondary | ICD-10-CM

## 2014-12-10 DIAGNOSIS — N179 Acute kidney failure, unspecified: Secondary | ICD-10-CM | POA: Diagnosis not present

## 2014-12-10 DIAGNOSIS — S72341A Displaced spiral fracture of shaft of right femur, initial encounter for closed fracture: Secondary | ICD-10-CM | POA: Insufficient documentation

## 2014-12-10 DIAGNOSIS — I255 Ischemic cardiomyopathy: Secondary | ICD-10-CM | POA: Diagnosis present

## 2014-12-10 DIAGNOSIS — Z79899 Other long term (current) drug therapy: Secondary | ICD-10-CM | POA: Diagnosis not present

## 2014-12-10 DIAGNOSIS — K219 Gastro-esophageal reflux disease without esophagitis: Secondary | ICD-10-CM | POA: Diagnosis present

## 2014-12-10 DIAGNOSIS — S7221XA Displaced subtrochanteric fracture of right femur, initial encounter for closed fracture: Principal | ICD-10-CM | POA: Diagnosis present

## 2014-12-10 DIAGNOSIS — Y92009 Unspecified place in unspecified non-institutional (private) residence as the place of occurrence of the external cause: Secondary | ICD-10-CM | POA: Diagnosis not present

## 2014-12-10 DIAGNOSIS — Z7901 Long term (current) use of anticoagulants: Secondary | ICD-10-CM

## 2014-12-10 DIAGNOSIS — E039 Hypothyroidism, unspecified: Secondary | ICD-10-CM | POA: Diagnosis present

## 2014-12-10 DIAGNOSIS — I10 Essential (primary) hypertension: Secondary | ICD-10-CM | POA: Diagnosis present

## 2014-12-10 DIAGNOSIS — R55 Syncope and collapse: Secondary | ICD-10-CM | POA: Diagnosis not present

## 2014-12-10 DIAGNOSIS — I482 Chronic atrial fibrillation, unspecified: Secondary | ICD-10-CM | POA: Insufficient documentation

## 2014-12-10 DIAGNOSIS — E785 Hyperlipidemia, unspecified: Secondary | ICD-10-CM | POA: Diagnosis present

## 2014-12-10 DIAGNOSIS — R7989 Other specified abnormal findings of blood chemistry: Secondary | ICD-10-CM

## 2014-12-10 DIAGNOSIS — W19XXXA Unspecified fall, initial encounter: Secondary | ICD-10-CM

## 2014-12-10 DIAGNOSIS — I959 Hypotension, unspecified: Secondary | ICD-10-CM | POA: Diagnosis not present

## 2014-12-10 DIAGNOSIS — Z951 Presence of aortocoronary bypass graft: Secondary | ICD-10-CM

## 2014-12-10 DIAGNOSIS — Z823 Family history of stroke: Secondary | ICD-10-CM | POA: Diagnosis not present

## 2014-12-10 DIAGNOSIS — W010XXA Fall on same level from slipping, tripping and stumbling without subsequent striking against object, initial encounter: Secondary | ICD-10-CM | POA: Diagnosis present

## 2014-12-10 DIAGNOSIS — Z66 Do not resuscitate: Secondary | ICD-10-CM | POA: Diagnosis present

## 2014-12-10 DIAGNOSIS — D539 Nutritional anemia, unspecified: Secondary | ICD-10-CM | POA: Diagnosis present

## 2014-12-10 DIAGNOSIS — S72141A Displaced intertrochanteric fracture of right femur, initial encounter for closed fracture: Secondary | ICD-10-CM | POA: Diagnosis not present

## 2014-12-10 DIAGNOSIS — I5042 Chronic combined systolic (congestive) and diastolic (congestive) heart failure: Secondary | ICD-10-CM | POA: Diagnosis present

## 2014-12-10 DIAGNOSIS — I5022 Chronic systolic (congestive) heart failure: Secondary | ICD-10-CM | POA: Insufficient documentation

## 2014-12-10 DIAGNOSIS — T8189XA Other complications of procedures, not elsewhere classified, initial encounter: Secondary | ICD-10-CM | POA: Diagnosis not present

## 2014-12-10 DIAGNOSIS — I2510 Atherosclerotic heart disease of native coronary artery without angina pectoris: Secondary | ICD-10-CM | POA: Diagnosis present

## 2014-12-10 LAB — BASIC METABOLIC PANEL
Anion gap: 13 (ref 5–15)
BUN: 70 mg/dL — AB (ref 6–23)
CHLORIDE: 106 mmol/L (ref 96–112)
CO2: 20 mmol/L (ref 19–32)
Calcium: 8.6 mg/dL (ref 8.4–10.5)
Creatinine, Ser: 2.83 mg/dL — ABNORMAL HIGH (ref 0.50–1.35)
GFR calc non Af Amer: 18 mL/min — ABNORMAL LOW (ref >90)
GFR, EST AFRICAN AMERICAN: 21 mL/min — AB (ref >90)
GLUCOSE: 152 mg/dL — AB (ref 70–99)
Potassium: 4.1 mmol/L (ref 3.5–5.1)
Sodium: 139 mmol/L (ref 135–145)

## 2014-12-10 LAB — CBC WITH DIFFERENTIAL/PLATELET
Basophils Absolute: 0 10*3/uL (ref 0.0–0.1)
Basophils Relative: 0 % (ref 0–1)
EOS ABS: 0.3 10*3/uL (ref 0.0–0.7)
EOS PCT: 3 % (ref 0–5)
HCT: 32.8 % — ABNORMAL LOW (ref 39.0–52.0)
HEMOGLOBIN: 10.8 g/dL — AB (ref 13.0–17.0)
Lymphocytes Relative: 13 % (ref 12–46)
Lymphs Abs: 1.4 10*3/uL (ref 0.7–4.0)
MCH: 29.7 pg (ref 26.0–34.0)
MCHC: 32.9 g/dL (ref 30.0–36.0)
MCV: 90.1 fL (ref 78.0–100.0)
MONOS PCT: 6 % (ref 3–12)
Monocytes Absolute: 0.7 10*3/uL (ref 0.1–1.0)
Neutro Abs: 9.1 10*3/uL — ABNORMAL HIGH (ref 1.7–7.7)
Neutrophils Relative %: 78 % — ABNORMAL HIGH (ref 43–77)
PLATELETS: 123 10*3/uL — AB (ref 150–400)
RBC: 3.64 MIL/uL — AB (ref 4.22–5.81)
RDW: 14.8 % (ref 11.5–15.5)
WBC: 11.5 10*3/uL — ABNORMAL HIGH (ref 4.0–10.5)

## 2014-12-10 LAB — I-STAT TROPONIN, ED: TROPONIN I, POC: 0.02 ng/mL (ref 0.00–0.08)

## 2014-12-10 LAB — CBG MONITORING, ED: GLUCOSE-CAPILLARY: 146 mg/dL — AB (ref 70–99)

## 2014-12-10 LAB — PROTIME-INR
INR: 2.03 — ABNORMAL HIGH (ref 0.00–1.49)
Prothrombin Time: 23.1 seconds — ABNORMAL HIGH (ref 11.6–15.2)

## 2014-12-10 LAB — TROPONIN I: Troponin I: 0.03 ng/mL (ref <0.031)

## 2014-12-10 MED ORDER — FENTANYL CITRATE (PF) 100 MCG/2ML IJ SOLN
100.0000 ug | Freq: Once | INTRAMUSCULAR | Status: AC
  Administered 2014-12-10: 100 ug via INTRAVENOUS
  Filled 2014-12-10: qty 2

## 2014-12-10 MED ORDER — FENTANYL CITRATE (PF) 100 MCG/2ML IJ SOLN
50.0000 ug | INTRAMUSCULAR | Status: DC | PRN
  Administered 2014-12-10 – 2014-12-11 (×2): 50 ug via INTRAVENOUS
  Filled 2014-12-10 (×2): qty 2

## 2014-12-10 MED ORDER — VITAMIN K1 10 MG/ML IJ SOLN
10.0000 mg | Freq: Once | INTRAVENOUS | Status: AC
  Administered 2014-12-10: 10 mg via INTRAVENOUS
  Filled 2014-12-10: qty 1

## 2014-12-10 NOTE — ED Notes (Signed)
Pt placed on hospital bed and Ortho tech paged to place pt in bucks traction.

## 2014-12-10 NOTE — Progress Notes (Signed)
Orthopedic Tech Progress Note Patient Details:  [name] Frisbee [employer] Sr. [birth year] [id number] Applied Buck's traction to RLE using 10 LB weight.  Pulses, sensation, motion intact before and after application.  Capillary refill less than 2 seconds before and after application. Musculoskeletal Traction Type of Traction: Bucks Skin Traction Traction Location: RLE Traction Weight: 10 lbs    Darrol Poke 12/10/2014, 11:14 PM

## 2014-12-10 NOTE — ED Notes (Signed)
Per ems pt found on floor after having a fall at home. Pt c/o of pain in right hip. EmS gave 150 mcg fentanyl given. Pt arrived on back board and c collar. Pt alert and orineted to person and place.

## 2014-12-10 NOTE — ED Notes (Signed)
Dr.Kim at bedside; chest xray changed to portable chest due to traction splint

## 2014-12-10 NOTE — Consult Note (Signed)
Reason for Consult:  Right femur fracture Referring Physician: EDP  [name] G Faison Sr. is an 81 [name].o. male.  HPI:   81 yo male who sustained a right femur fracture after an accidental mechanical fall at home.  Was transported to the ED with a right leg deformity, severe pain, and the inability to ambulate.  Ortho and Hospitalist consultes have been requested.  Past Medical History  Diagnosis Date  . Coronary artery disease     a.  s/p MI;  b. s/p CABG 2007;  c. cath 12/08: LM occluded, pRCA 80%, dRCA 50%, PLB 90%, S-PDA ok, S-Dx ok with occluded dist Dx; L-LAD ok, S-OM ok with 90% after graft insertion . . . . distal OM treated with DES  . Ischemic cardiomyopathy     echo 6/12 EF 35-40%, apical, dAnt, mid to dist inf-lat HK, mild AI, mod MR, mod BAE, PASP 44  . Chronic combined systolic and diastolic heart failure     admx 8/13;   . Hypertension   . Atrial fibrillation     a. on coumadin.  . CKD (chronic kidney disease), stage IV   . CVA (cerebral infarction)     left frontal  . Anemia     chronic  . Hypothyroidism   . Leg muscle spasm   . GERD (gastroesophageal reflux disease)   . Skin cancer     Past Surgical History  Procedure Laterality Date  . Coronary artery bypass graft  2007  . Coronary angioplasty with stent placement      Family History  Problem Relation Age of Onset  . Cancer Mother   . Stroke Father     Social History:  reports that he has never smoked. He has never used smokeless tobacco. He reports that he does not drink alcohol or use illicit drugs.  Allergies:  Allergies  Allergen Reactions  . Ace Inhibitors     Cannot remember the reaction    Medications: I have reviewed the patient's current medications.  Results for orders placed or performed during the hospital encounter of 12/10/14 (from the past 48 hour(s))  Type and screen     Status: None   Collection Time: 12/10/14  8:23 PM  Result Value Ref Range   ABO/RH(D) O POS    Antibody Screen  NEG    Sample Expiration 12/13/2014   CBG monitoring, ED     Status: Abnormal   Collection Time: 12/10/14  8:30 PM  Result Value Ref Range   Glucose-Capillary 146 (H) 70 - 99 mg/dL  Protime-INR     Status: Abnormal   Collection Time: 12/10/14  8:34 PM  Result Value Ref Range   Prothrombin Time 23.1 (H) 11.6 - 15.2 seconds   INR 2.03 (H) 0.00 - 1.49  CBC with Differential     Status: Abnormal   Collection Time: 12/10/14  8:34 PM  Result Value Ref Range   WBC 11.5 (H) 4.0 - 10.5 K/uL   RBC 3.64 (L) 4.22 - 5.81 MIL/uL   Hemoglobin 10.8 (L) 13.0 - 17.0 g/dL   HCT 32.8 (L) 39.0 - 52.0 %   MCV 90.1 78.0 - 100.0 fL   MCH 29.7 26.0 - 34.0 pg   MCHC 32.9 30.0 - 36.0 g/dL   RDW 14.8 11.5 - 15.5 %   Platelets 123 (L) 150 - 400 K/uL   Neutrophils Relative % 78 (H) 43 - 77 %   Neutro Abs 9.1 (H) 1.7 - 7.7 K/uL   Lymphocytes  Relative 13 12 - 46 %   Lymphs Abs 1.4 0.7 - 4.0 K/uL   Monocytes Relative 6 3 - 12 %   Monocytes Absolute 0.7 0.1 - 1.0 K/uL   Eosinophils Relative 3 0 - 5 %   Eosinophils Absolute 0.3 0.0 - 0.7 K/uL   Basophils Relative 0 0 - 1 %   Basophils Absolute 0.0 0.0 - 0.1 K/uL  Basic metabolic panel     Status: Abnormal   Collection Time: 12/10/14  8:34 PM  Result Value Ref Range   Sodium 139 135 - 145 mmol/L   Potassium 4.1 3.5 - 5.1 mmol/L   Chloride 106 96 - 112 mmol/L   CO2 20 19 - 32 mmol/L   Glucose, Bld 152 (H) 70 - 99 mg/dL   BUN 70 (H) 6 - 23 mg/dL   Creatinine, Ser 2.83 (H) 0.50 - 1.35 mg/dL   Calcium 8.6 8.4 - 10.5 mg/dL   GFR calc non Af Amer 18 (L) >90 mL/min   GFR calc Af Amer 21 (L) >90 mL/min    Comment: (NOTE) The eGFR has been calculated using the CKD EPI equation. This calculation has not been validated in all clinical situations. eGFR's persistently <90 mL/min signify possible Chronic Kidney Disease.    Anion gap 13 5 - 15    Dg Pelvis 1-2 Views  12/10/2014   CLINICAL DATA:  [place] today.  Right leg pain.  EXAM: RIGHT FEMUR 2 VIEWS; PELVIS -  1-2 VIEW  COMPARISON:  None.  FINDINGS: There is a comminuted displaced subtrochanteric fracture of the femur. No definite pelvic fractures. Both hips are normally located.  IMPRESSION: Displaced subtrochanteric fracture of the right femur.   Electronically Signed   By: Marijo Sanes M.D.   On: 12/10/2014 21:35   Ct Head Wo Contrast  12/10/2014   CLINICAL DATA:  Found on the floor after falling at home.  EXAM: CT HEAD WITHOUT CONTRAST  CT CERVICAL SPINE WITHOUT CONTRAST  TECHNIQUE: Multidetector CT imaging of the head and cervical spine was performed following the standard protocol without intravenous contrast. Multiplanar CT image reconstructions of the cervical spine were also generated.  COMPARISON:  07/14/2014  FINDINGS: CT HEAD FINDINGS  There is generalized brain atrophy. There is chronic small vessel disease throughout the deep white matter. 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 There is chronic temporal and occipital atrophy with prominence of the ventricles. No sign of acute infarction, mass lesion, hemorrhage, obstructive hydrocephalus or extra-axial collection. No skull fracture. No traumatic fluid in the sinuses, middle ears or mastoids. Chronic sclerosis in the right mastoid region.  CT CERVICAL SPINE FINDINGS  No cervical fracture. No traumatic malalignment. No soft tissue swelling.  There are prominent bridging osteophytes consistent with spondylosis 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 There is ordinary facet osteoarthritis.  IMPRESSION: Head CT: Atrophy and chronic ischemic changes. No acute or traumatic finding.  Cervical spine CT: Chronic spondylosis and ankylosis. No acute or traumatic finding.   Electronically Signed   By: Nelson Chimes M.D.   On: 12/10/2014 21:57   Ct Cervical Spine Wo Contrast  12/10/2014   CLINICAL DATA:  Found on the floor after falling at home.  EXAM: CT HEAD WITHOUT CONTRAST  CT CERVICAL SPINE  WITHOUT CONTRAST  TECHNIQUE: Multidetector CT imaging of the head and cervical spine was performed following the standard protocol without intravenous contrast. Multiplanar CT image reconstructions of the cervical spine were also generated.  COMPARISON:  07/14/2014  FINDINGS: CT HEAD FINDINGS  There is generalized brain atrophy. There is chronic small vessel disease throughout the deep white matter. 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 There is chronic temporal and occipital atrophy with prominence of the ventricles. No sign of acute infarction, mass lesion, hemorrhage, obstructive hydrocephalus or extra-axial collection. No skull fracture. No traumatic fluid in the sinuses, middle ears or mastoids. Chronic sclerosis in the right mastoid region.  CT CERVICAL SPINE FINDINGS  No cervical fracture. No traumatic malalignment. No soft tissue swelling.  There are prominent bridging osteophytes consistent with spondylosis 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 There is ordinary facet osteoarthritis.  IMPRESSION: Head CT: Atrophy and chronic ischemic changes. No acute or traumatic finding.  Cervical spine CT: Chronic spondylosis and ankylosis. No acute or traumatic finding.   Electronically Signed   By: Nelson Chimes M.D.   On: 12/10/2014 21:57   Dg Femur, Min 2 Views Right  12/10/2014   CLINICAL DATA:  [place] today.  Right leg pain.  EXAM: RIGHT FEMUR 2 VIEWS; PELVIS - 1-2 VIEW  COMPARISON:  None.  FINDINGS: There is a comminuted displaced subtrochanteric fracture of the femur. No definite pelvic fractures. Both hips are normally located.  IMPRESSION: Displaced subtrochanteric fracture of the right femur.   Electronically Signed   By: Marijo Sanes M.D.   On: 12/10/2014 21:35    ROS Blood pressure 144/49, pulse 118, resp. rate 17, SpO2 100 %. Physical Exam  Musculoskeletal:       Right upper leg: He exhibits tenderness, bony tenderness, swelling and  deformity.    Assessment/Plan: Severely displaced and angulated right femoral shaft fracture status-post mechanical fall 1)  I spoke to the patient, his wife, and I believe daughter at the bedside.  He is in significant pain and his fracture does need stabilization with an intramedullary rod/nail.  He does need medical clearance and possibly cardiac clearance given his co-morbidities.  His INR is also > 2.  I did order an IV dose of Vit K for tonight.  The family understands that surgery is warranted mainly for pain and mobility purposes.  Will likely be high risk, but pain and quality of life is certainly the driving factor for surgery.  I will plan on surgery for tomorrow.  Will have the Ortho Tech place 10 lbs of buck's traction on his right leg once a bed/room is obtained.  BLACKMAN,[name] 12/10/2014, 10:14 PM

## 2014-12-10 NOTE — Telephone Encounter (Signed)
Spoke with pt dtr, FMLA paperwork complete.  Original mailed to company. Copy mailed to dtr.

## 2014-12-10 NOTE — Telephone Encounter (Signed)
Left message for pt dtr to call  

## 2014-12-10 NOTE — H&P (Signed)
[name] G Mistry Sr. is an 81 y.o. male.     Chief Complaint: fall HPI: 81 yo male with hx of CAD , CKD stage 4, CM (EF 35-40%), Anemia, hypothyroidism, CVA, apparently c/o fall, unwittnessed.  Pt doesn't think that there was syncope but can't rule it out.  Pt was brought to ED by family and found to have right femur fracture.  Pt was seen by orthopedics who requested medical admission.    Past Medical History  Diagnosis Date  . Coronary artery disease     a.  s/p MI;  b. s/p CABG 2007;  c. cath 12/08: LM occluded, pRCA 80%, dRCA 50%, PLB 90%, S-PDA ok, S-Dx ok with occluded dist Dx; L-LAD ok, S-OM ok with 90% after graft insertion . . . . distal OM treated with DES  . Ischemic cardiomyopathy     echo 6/12 EF 35-40%, apical, dAnt, mid to dist inf-lat HK, mild AI, mod MR, mod BAE, PASP 44  . Chronic combined systolic and diastolic heart failure     admx 8/13;   . Hypertension   . Atrial fibrillation     a. on coumadin.  . CKD (chronic kidney disease), stage IV   . CVA (cerebral infarction)     left frontal  . Anemia     chronic  . Hypothyroidism   . Leg muscle spasm   . GERD (gastroesophageal reflux disease)   . Skin cancer     Past Surgical History  Procedure Laterality Date  . Coronary artery bypass graft  2007  . Coronary angioplasty with stent placement      Family History  Problem Relation Age of Onset  . Cancer Mother   . Stroke Father    Social History:  reports that he has never smoked. He has never used smokeless tobacco. He reports that he does not drink alcohol or use illicit drugs.  Allergies:  Allergies  Allergen Reactions  . Ace Inhibitors     Cannot remember the reaction   Medications reviewed  (Not in a hospital admission)  Results for orders placed or performed during the hospital encounter of 12/10/14 (from the past 48 hour(s))  Type and screen     Status: None   Collection Time: 12/10/14  8:23 PM  Result Value Ref Range   ABO/RH(D) O POS     Antibody Screen NEG    Sample Expiration 12/13/2014   CBG monitoring, ED     Status: Abnormal   Collection Time: 12/10/14  8:30 PM  Result Value Ref Range   Glucose-Capillary 146 (H) 70 - 99 mg/dL  Protime-INR     Status: Abnormal   Collection Time: 12/10/14  8:34 PM  Result Value Ref Range   Prothrombin Time 23.1 (H) 11.6 - 15.2 seconds   INR 2.03 (H) 0.00 - 1.49  CBC with Differential     Status: Abnormal   Collection Time: 12/10/14  8:34 PM  Result Value Ref Range   WBC 11.5 (H) 4.0 - 10.5 K/uL   RBC 3.64 (L) 4.22 - 5.81 MIL/uL   Hemoglobin 10.8 (L) 13.0 - 17.0 g/dL   HCT 32.8 (L) 39.0 - 52.0 %   MCV 90.1 78.0 - 100.0 fL   MCH 29.7 26.0 - 34.0 pg   MCHC 32.9 30.0 - 36.0 g/dL   RDW 14.8 11.5 - 15.5 %   Platelets 123 (L) 150 - 400 K/uL   Neutrophils Relative % 78 (H) 43 - 77 %   Neutro  Abs 9.1 (H) 1.7 - 7.7 K/uL   Lymphocytes Relative 13 12 - 46 %   Lymphs Abs 1.4 0.7 - 4.0 K/uL   Monocytes Relative 6 3 - 12 %   Monocytes Absolute 0.7 0.1 - 1.0 K/uL   Eosinophils Relative 3 0 - 5 %   Eosinophils Absolute 0.3 0.0 - 0.7 K/uL   Basophils Relative 0 0 - 1 %   Basophils Absolute 0.0 0.0 - 0.1 K/uL  Basic metabolic panel     Status: Abnormal   Collection Time: 12/10/14  8:34 PM  Result Value Ref Range   Sodium 139 135 - 145 mmol/L   Potassium 4.1 3.5 - 5.1 mmol/L   Chloride 106 96 - 112 mmol/L   CO2 20 19 - 32 mmol/L   Glucose, Bld 152 (H) 70 - 99 mg/dL   BUN 70 (H) 6 - 23 mg/dL   Creatinine, Ser 2.83 (H) 0.50 - 1.35 mg/dL   Calcium 8.6 8.4 - 10.5 mg/dL   GFR calc non Af Amer 18 (L) >90 mL/min   GFR calc Af Amer 21 (L) >90 mL/min    Comment: (NOTE) The eGFR has been calculated using the CKD EPI equation. This calculation has not been validated in all clinical situations. eGFR's persistently <90 mL/min signify possible Chronic Kidney Disease.    Anion gap 13 5 - 15  I-stat troponin, ED     Status: None   Collection Time: 12/10/14 10:42 PM  Result Value Ref Range    Troponin i, poc 0.02 0.00 - 0.08 ng/mL   Comment 3            Comment: Due to the release kinetics of cTnI, a negative result within the first hours of the onset of symptoms does not rule out myocardial infarction with certainty. If myocardial infarction is still suspected, repeat the test at appropriate intervals.    Dg Pelvis 1-2 Views  12/10/2014   CLINICAL DATA:  [place] today.  Right leg pain.  EXAM: RIGHT FEMUR 2 VIEWS; PELVIS - 1-2 VIEW  COMPARISON:  None.  FINDINGS: There is a comminuted displaced subtrochanteric fracture of the femur. No definite pelvic fractures. Both hips are normally located.  IMPRESSION: Displaced subtrochanteric fracture of the right femur.   Electronically Signed   By: Marijo Sanes M.D.   On: 12/10/2014 21:35   Ct Head Wo Contrast  12/10/2014   CLINICAL DATA:  Found on the floor after falling at home.  EXAM: CT HEAD WITHOUT CONTRAST  CT CERVICAL SPINE WITHOUT CONTRAST  TECHNIQUE: Multidetector CT imaging of the head and cervical spine was performed following the standard protocol without intravenous contrast. Multiplanar CT image reconstructions of the cervical spine were also generated.  COMPARISON:  07/14/2014  FINDINGS: CT HEAD FINDINGS  There is generalized brain atrophy. There is chronic small vessel disease throughout the deep white matter. 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 There is chronic temporal and occipital atrophy with prominence of the ventricles. No sign of acute infarction, mass lesion, hemorrhage, obstructive hydrocephalus or extra-axial collection. No skull fracture. No traumatic fluid in the sinuses, middle ears or mastoids. Chronic sclerosis in the right mastoid region.  CT CERVICAL SPINE FINDINGS  No cervical fracture. No traumatic malalignment. No soft tissue swelling.  There are prominent bridging osteophytes consistent with spondylosis 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 There  is ordinary facet osteoarthritis.  IMPRESSION: Head CT: Atrophy and chronic ischemic changes. No acute  or traumatic finding.  Cervical spine CT: Chronic spondylosis and ankylosis. No acute or traumatic finding.   Electronically Signed   By: Nelson Chimes M.D.   On: 12/10/2014 21:57   Ct Cervical Spine Wo Contrast  12/10/2014   CLINICAL DATA:  Found on the floor after falling at home.  EXAM: CT HEAD WITHOUT CONTRAST  CT CERVICAL SPINE WITHOUT CONTRAST  TECHNIQUE: Multidetector CT imaging of the head and cervical spine was performed following the standard protocol without intravenous contrast. Multiplanar CT image reconstructions of the cervical spine were also generated.  COMPARISON:  07/14/2014  FINDINGS: CT HEAD FINDINGS  There is generalized brain atrophy. There is chronic small vessel disease throughout the deep white matter. 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 There is chronic temporal and occipital atrophy with prominence of the ventricles. No sign of acute infarction, mass lesion, hemorrhage, obstructive hydrocephalus or extra-axial collection. No skull fracture. No traumatic fluid in the sinuses, middle ears or mastoids. Chronic sclerosis in the right mastoid region.  CT CERVICAL SPINE FINDINGS  No cervical fracture. No traumatic malalignment. No soft tissue swelling.  There are prominent bridging osteophytes consistent with spondylosis 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 There is ordinary facet osteoarthritis.  IMPRESSION: Head CT: Atrophy and chronic ischemic changes. No acute or traumatic finding.  Cervical spine CT: Chronic spondylosis and ankylosis. No acute or traumatic finding.   Electronically Signed   By: Nelson Chimes M.D.   On: 12/10/2014 21:57   Dg Femur, Min 2 Views Right  12/10/2014   CLINICAL DATA:  [place] today.  Right leg pain.  EXAM: RIGHT FEMUR 2 VIEWS; PELVIS - 1-2 VIEW  COMPARISON:  None.  FINDINGS: There is a  comminuted displaced subtrochanteric fracture of the femur. No definite pelvic fractures. Both hips are normally located.  IMPRESSION: Displaced subtrochanteric fracture of the right femur.   Electronically Signed   By: Marijo Sanes M.D.   On: 12/10/2014 21:35    Review of Systems  Constitutional: Negative.   HENT: Negative.   Eyes: Negative.   Respiratory: Negative.   Cardiovascular: Negative.   Gastrointestinal: Negative.   Genitourinary: Negative.   Musculoskeletal: Positive for joint pain. Negative for myalgias, back pain and neck pain.  Skin: Negative.   Neurological:       ? syncope  Endo/Heme/Allergies: Negative.   Psychiatric/Behavioral: Negative.     Blood pressure 135/65, pulse 95, temperature 97.5 F (36.4 C), temperature source Oral, resp. rate 16, SpO2 100 %. Physical Exam  Constitutional: He appears well-developed and well-nourished.  HENT:  Head: Normocephalic and atraumatic.  Mouth/Throat: No oropharyngeal exudate.  Eyes: Conjunctivae and EOM are normal. Pupils are equal, round, and reactive to light. No scleral icterus.  Neck: Normal range of motion. Neck supple. No JVD present. No tracheal deviation present. No thyromegaly present.  Cardiovascular: Normal rate and regular rhythm.  Exam reveals no gallop and no friction rub.   No murmur heard. Respiratory: Effort normal and breath sounds normal. No respiratory distress. He has no wheezes. He has no rales.  GI: Soft. Bowel sounds are normal. He exhibits no distension. There is no tenderness. There is no rebound and no guarding.  Musculoskeletal: Normal range of motion. He exhibits tenderness. He exhibits no edema.  Lymphadenopathy:    He has no cervical adenopathy.  Neurological: He is alert.  Skin: Skin is warm and dry. No rash noted. No erythema. No pallor.  Psychiatric: He has a  normal mood and affect. His behavior is normal. Judgment and thought content normal.     Assessment/Plan Fall, R femur  fracture NPO except for medications Hold coumadin CXR, ua orthpedic consultation in place,  Appreciate input.   ? Near syncope Tele, check trop i q6h x3  Anemia Check cbc, cmp in am  Hypothyroidism Check tsh, cont levothyroxine  CKD stage 4 Check cmp in am  Pafib Check INR in am Hold coumadin, if INR not less than 1.5  Consider giving vitamin K 2.18m po x1  Aundrea Horace 12/10/2014, 11:12 PM

## 2014-12-10 NOTE — ED Provider Notes (Signed)
CSN: [id number]     Arrival date & time 12/10/14  1946 History   First MD Initiated Contact with Patient 12/10/14 1949     Chief Complaint  Patient presents with  . Fall     (Consider location/radiation/quality/duration/timing/severity/associated sxs/prior Treatment) Patient is a 81 y.o. male presenting with fall. The history is provided by the spouse and a relative. No language interpreter was used.  Fall This is a new problem. The current episode started today. The problem has been unchanged. Pertinent negatives include no abdominal pain, chest pain, fever, headaches, urinary symptoms or visual change. Nothing aggravates the symptoms.    Past Medical History  Diagnosis Date  . Coronary artery disease     a.  s/p MI;  b. s/p CABG 2007;  c. cath 12/08: LM occluded, pRCA 80%, dRCA 50%, PLB 90%, S-PDA ok, S-Dx ok with occluded dist Dx; L-LAD ok, S-OM ok with 90% after graft insertion . . . . distal OM treated with DES  . Ischemic cardiomyopathy     echo 6/12 EF 35-40%, apical, dAnt, mid to dist inf-lat HK, mild AI, mod MR, mod BAE, PASP 44  . Chronic combined systolic and diastolic heart failure     admx 8/13;   . Hypertension   . Atrial fibrillation     a. on coumadin.  . CKD (chronic kidney disease), stage IV   . CVA (cerebral infarction)     left frontal  . Anemia     chronic  . Hypothyroidism   . Leg muscle spasm   . GERD (gastroesophageal reflux disease)   . Skin cancer    Past Surgical History  Procedure Laterality Date  . Coronary artery bypass graft  2007  . Coronary angioplasty with stent placement     Family History  Problem Relation Age of Onset  . Cancer Mother   . Stroke Father    History  Substance Use Topics  . Smoking status: Never Smoker   . Smokeless tobacco: Never Used  . Alcohol Use: No    Review of Systems  Unable to perform ROS: Dementia  Constitutional: Negative for fever.  Respiratory: Negative for chest tightness and shortness of breath.    Cardiovascular: Negative for chest pain.  Gastrointestinal: Negative for abdominal pain.  Musculoskeletal: Positive for gait problem.  Neurological: Negative for facial asymmetry, speech difficulty and headaches.  Psychiatric/Behavioral: Positive for confusion.      Allergies  Ace inhibitors  Home Medications   Prior to Admission medications   Medication Sig Start Date End Date Taking? Authorizing Provider  carvedilol (COREG) 3.125 MG tablet Take 1 tablet (3.125 mg total) by mouth 2 (two) times daily with a meal. 10/22/14  Yes Minus Breeding, MD  Dietary Management Product (VASCULERA PO) Take 1 tablet by mouth daily. 630 mg tablet   Yes Historical Provider, MD  fenofibrate 54 MG tablet TAKE 1 TABLET BY MOUTH DAILY 09/16/14  Yes Minus Breeding, MD  furosemide (LASIX) 20 MG tablet TAKE 2 TABLETS BY MOUTH DAILY 11/15/14  Yes Minus Breeding, MD  levothyroxine (SYNTHROID, LEVOTHROID) 25 MCG tablet Take 25 mcg by mouth daily before breakfast.   Yes Historical Provider, MD  nitroGLYCERIN (NITROSTAT) 0.4 MG SL tablet Place 1 tablet (0.4 mg total) under the tongue every 5 (five) minutes as needed for chest pain. 05/18/14  Yes Minus Breeding, MD  RAPAFLO 8 MG CAPS capsule TAKE 1 CAPLET BY MOUTH DAILY WITH BREAKFAST. 05/08/14  Yes Janith Lima, MD  warfarin (COUMADIN) 2.5  MG tablet Take 1 to 1.5 tablets by mouth daily as directed by coumadin clinic Patient taking differently: Take 2.5 mg by mouth See admin instructions. Take 1 every day except on Monday and Thursday take  1.5 tablets by mouth daily as directed by coumadin clinic. 10/26/14  Yes Minus Breeding, MD  Dietary Management Product (VASCULERA) TABS TAKE 1 TABLET BY MOUTH DAILY Patient not taking: Reported on 12/10/2014 06/30/14   Janith Lima, MD  neomycin-bacitracin-polymyxin (NEOSPORIN) ointment Apply 1 application topically every 12 (twelve) hours. apply to eye Patient not taking: Reported on 12/10/2014 04/01/14   Carmelina Paddock, NP   BP  144/49 mmHg  Pulse 118  Resp 17  SpO2 100% Physical Exam  Constitutional: He is oriented to person, place, and time. He appears well-developed and well-nourished. No distress. Cervical collar and backboard in place.  Very hard of hearing  HENT:  Head: Normocephalic and atraumatic.  Nose: Nose normal.  Mouth/Throat: Oropharynx is clear and moist. No oropharyngeal exudate.  Eyes: EOM are normal. Pupils are equal, round, and reactive to light.  Neck:  EMS c-collar on   Cardiovascular: Normal rate, regular rhythm, normal heart sounds and intact distal pulses.   No murmur heard. Pulmonary/Chest: Effort normal and breath sounds normal. No respiratory distress. He has no wheezes. He exhibits no tenderness.  Old well healed midline sternotomy scar  Abdominal: Soft. He exhibits no distension. There is no tenderness. There is no guarding.  Musculoskeletal: Normal range of motion. He exhibits tenderness.  Very tender to palpation of right hip.  RLE held internally rotated and shortened.  Good distal sensation and pulses.   No C/T/L spine tenderness.  No chest wall tenderness or crepitus. No upper extremity tenderness or bony deformities.   Neurological: He is alert and oriented to person, place, and time. No cranial nerve deficit. Coordination normal.  Skin: Skin is warm and dry. He is not diaphoretic. No pallor.  Psychiatric: He has a normal mood and affect. His behavior is normal. Judgment and thought content normal.  Nursing note and vitals reviewed.   ED Course  Procedures (including critical care time) Labs Review Labs Reviewed  PROTIME-INR - Abnormal; Notable for the following:    Prothrombin Time 23.1 (*)    INR 2.03 (*)    All other components within normal limits  CBC WITH DIFFERENTIAL/PLATELET - Abnormal; Notable for the following:    WBC 11.5 (*)    RBC 3.64 (*)    Hemoglobin 10.8 (*)    HCT 32.8 (*)    Platelets 123 (*)    Neutrophils Relative % 78 (*)    Neutro Abs 9.1  (*)    All other components within normal limits  BASIC METABOLIC PANEL - Abnormal; Notable for the following:    Glucose, Bld 152 (*)    BUN 70 (*)    Creatinine, Ser 2.83 (*)    GFR calc non Af Amer 18 (*)    GFR calc Af Amer 21 (*)    All other components within normal limits  CBG MONITORING, ED - Abnormal; Notable for the following:    Glucose-Capillary 146 (*)    All other components within normal limits  TYPE AND SCREEN    Imaging Review Dg Pelvis 1-2 Views  12/10/2014   CLINICAL DATA:  [place] today.  Right leg pain.  EXAM: RIGHT FEMUR 2 VIEWS; PELVIS - 1-2 VIEW  COMPARISON:  None.  FINDINGS: There is a comminuted displaced subtrochanteric fracture of the femur.  No definite pelvic fractures. Both hips are normally located.  IMPRESSION: Displaced subtrochanteric fracture of the right femur.   Electronically Signed   By: Marijo Sanes M.D.   On: 12/10/2014 21:35   Dg Femur, Min 2 Views Right  12/10/2014   CLINICAL DATA:  [place] today.  Right leg pain.  EXAM: RIGHT FEMUR 2 VIEWS; PELVIS - 1-2 VIEW  COMPARISON:  None.  FINDINGS: There is a comminuted displaced subtrochanteric fracture of the femur. No definite pelvic fractures. Both hips are normally located.  IMPRESSION: Displaced subtrochanteric fracture of the right femur.   Electronically Signed   By: Marijo Sanes M.D.   On: 12/10/2014 21:35     EKG Interpretation   Date/Time:  Friday December 10 2014 19:54:47 EDT Ventricular Rate:  89 PR Interval:    QRS Duration: 153 QT Interval:  418 QTC Calculation: 509 R Axis:   97 Text Interpretation:  Atrial fibrillation Paired ventricular premature  complexes RBBB and LPFB Probable lateral infarct, old artifact noted No  significant change since last tracing Confirmed by Christy Gentles  MD, DONALD  [phone]) on 12/10/2014 8:15:39 PM      MDM   Final diagnoses:  Fall  Right hip pain  Closed displaced spiral fracture of shaft of right femur, initial encounter  Chronic anticoagulation   Pt  is a 81 yo M with hx of Afib (on coumadin), CAD (s/p CABG), CHF, HTN, CVA, and deafness who presents after a fall.  Unwittnessed and patient is unable to provide great details on the event.  Reports he was walking in the living room then landed on the ground.  Denies chest pain or lightheadness prior to falling.  Is supposed to ambulate with a cane but often does not.  Family was in the next room and heard him fall then ran in immediately.  He was alert and complaining of right hip pain. A cane was on the ground next to him, but patient does not remember if he was using it or not at the time.   Obvious right hip/femur deformity on exam. Holding his leg shortened and internally rotated. Distally NVI.   Denies pain other than at his right hip.   Will work up with CT head, cspine, and pelvis/right upper leg xrays.  Given fentanyl for pain.   EKG: rate controlled A fib with known RBBB.    Found to have a right femur spiral shaft fracture.   Ortho called.   Slight AKI (Cr 2.8 from baseline of 2.4).   CT scans of head and c-spine returned benign.    Due to hx, he will need admission to hospitalist.   Triaged to telemetry floor. Ok to move when a bed became available.  Family aware and agreeable to admission.   Patient was seen with ED Attending, Dr. Estelle Grumbles, MD   Tori Milks, MD 12/11/14 0347  Ripley Fraise, MD 12/12/14 0330

## 2014-12-10 NOTE — ED Provider Notes (Signed)
Patient seen/examined in the Emergency Department in conjunction with Resident Physician Provider [facility] Patient presents after fall.  He has right hip fracture Exam : awake/alert, pt with tenderness on ROM of right hip Plan: will need admission.  Consult to ortho obtained    Ripley Fraise, MD 12/10/14 2221

## 2014-12-11 ENCOUNTER — Inpatient Hospital Stay (HOSPITAL_COMMUNITY): Payer: Medicare Other

## 2014-12-11 ENCOUNTER — Inpatient Hospital Stay (HOSPITAL_COMMUNITY): Payer: Medicare Other | Admitting: Certified Registered Nurse Anesthetist

## 2014-12-11 ENCOUNTER — Encounter (HOSPITAL_COMMUNITY)
Admission: EM | Disposition: A | Discharge status: Skilled Nursing Facility | Payer: Self-pay | Source: Home / Self Care | Attending: Internal Medicine

## 2014-12-11 ENCOUNTER — Encounter (HOSPITAL_COMMUNITY): Payer: Self-pay | Admitting: Certified Registered Nurse Anesthetist

## 2014-12-11 DIAGNOSIS — D539 Nutritional anemia, unspecified: Secondary | ICD-10-CM

## 2014-12-11 DIAGNOSIS — I482 Chronic atrial fibrillation: Secondary | ICD-10-CM

## 2014-12-11 DIAGNOSIS — I255 Ischemic cardiomyopathy: Secondary | ICD-10-CM

## 2014-12-11 DIAGNOSIS — S72341A Displaced spiral fracture of shaft of right femur, initial encounter for closed fracture: Secondary | ICD-10-CM | POA: Insufficient documentation

## 2014-12-11 DIAGNOSIS — I25810 Atherosclerosis of coronary artery bypass graft(s) without angina pectoris: Secondary | ICD-10-CM

## 2014-12-11 DIAGNOSIS — I5022 Chronic systolic (congestive) heart failure: Secondary | ICD-10-CM

## 2014-12-11 HISTORY — PX: FEMUR IM NAIL: SHX1597

## 2014-12-11 LAB — CBC WITH DIFFERENTIAL/PLATELET
BASOS ABS: 0 10*3/uL (ref 0.0–0.1)
Basophils Relative: 0 % (ref 0–1)
EOS PCT: 0 % (ref 0–5)
Eosinophils Absolute: 0 10*3/uL (ref 0.0–0.7)
HCT: 29.9 % — ABNORMAL LOW (ref 39.0–52.0)
Hemoglobin: 9.7 g/dL — ABNORMAL LOW (ref 13.0–17.0)
LYMPHS ABS: 0.9 10*3/uL (ref 0.7–4.0)
Lymphocytes Relative: 10 % — ABNORMAL LOW (ref 12–46)
MCH: 29.8 pg (ref 26.0–34.0)
MCHC: 32.4 g/dL (ref 30.0–36.0)
MCV: 92 fL (ref 78.0–100.0)
MONO ABS: 0.8 10*3/uL (ref 0.1–1.0)
MONOS PCT: 9 % (ref 3–12)
NEUTROS ABS: 7 10*3/uL (ref 1.7–7.7)
Neutrophils Relative %: 81 % — ABNORMAL HIGH (ref 43–77)
PLATELETS: 116 10*3/uL — AB (ref 150–400)
RBC: 3.25 MIL/uL — AB (ref 4.22–5.81)
RDW: 14.8 % (ref 11.5–15.5)
WBC: 8.6 10*3/uL (ref 4.0–10.5)

## 2014-12-11 LAB — URINALYSIS, ROUTINE W REFLEX MICROSCOPIC
BILIRUBIN URINE: NEGATIVE
Glucose, UA: 100 mg/dL — AB
Ketones, ur: NEGATIVE mg/dL
Leukocytes, UA: NEGATIVE
NITRITE: NEGATIVE
PROTEIN: NEGATIVE mg/dL
Specific Gravity, Urine: 1.014 (ref 1.005–1.030)
Urobilinogen, UA: 0.2 mg/dL (ref 0.0–1.0)
pH: 5 (ref 5.0–8.0)

## 2014-12-11 LAB — TROPONIN I
TROPONIN I: 0.08 ng/mL — AB (ref <0.031)
Troponin I: 0.07 ng/mL — ABNORMAL HIGH (ref <0.031)

## 2014-12-11 LAB — COMPREHENSIVE METABOLIC PANEL
ALT: 13 U/L (ref 0–53)
ANION GAP: 16 — AB (ref 5–15)
AST: 21 U/L (ref 0–37)
Albumin: 3.6 g/dL (ref 3.5–5.2)
Alkaline Phosphatase: 31 U/L — ABNORMAL LOW (ref 39–117)
BUN: 67 mg/dL — ABNORMAL HIGH (ref 6–23)
CALCIUM: 8.3 mg/dL — AB (ref 8.4–10.5)
CHLORIDE: 105 mmol/L (ref 96–112)
CO2: 19 mmol/L (ref 19–32)
Creatinine, Ser: 2.88 mg/dL — ABNORMAL HIGH (ref 0.50–1.35)
GFR calc non Af Amer: 18 mL/min — ABNORMAL LOW (ref >90)
GFR, EST AFRICAN AMERICAN: 20 mL/min — AB (ref >90)
Glucose, Bld: 151 mg/dL — ABNORMAL HIGH (ref 70–99)
Potassium: 4.3 mmol/L (ref 3.5–5.1)
SODIUM: 140 mmol/L (ref 135–145)
TOTAL PROTEIN: 6.6 g/dL (ref 6.0–8.3)
Total Bilirubin: 1.2 mg/dL (ref 0.3–1.2)

## 2014-12-11 LAB — PROTIME-INR
INR: 1.71 — AB (ref 0.00–1.49)
Prothrombin Time: 20.2 seconds — ABNORMAL HIGH (ref 11.6–15.2)

## 2014-12-11 LAB — URINE MICROSCOPIC-ADD ON

## 2014-12-11 LAB — POCT I-STAT 4, (NA,K, GLUC, HGB,HCT)
Glucose, Bld: 148 mg/dL — ABNORMAL HIGH (ref 70–99)
HEMATOCRIT: 22 % — AB (ref 39.0–52.0)
Hemoglobin: 7.5 g/dL — ABNORMAL LOW (ref 13.0–17.0)
Potassium: 3.9 mmol/L (ref 3.5–5.1)
SODIUM: 142 mmol/L (ref 135–145)

## 2014-12-11 LAB — SURGICAL PCR SCREEN
MRSA, PCR: NEGATIVE
STAPHYLOCOCCUS AUREUS: POSITIVE — AB

## 2014-12-11 LAB — PREPARE RBC (CROSSMATCH)

## 2014-12-11 SURGERY — INSERTION, INTRAMEDULLARY ROD, FEMUR
Anesthesia: General | Laterality: Right

## 2014-12-11 MED ORDER — WARFARIN - PHARMACIST DOSING INPATIENT: Freq: Every day | Status: DC

## 2014-12-11 MED ORDER — ACETAMINOPHEN 325 MG PO TABS: 650.0000 mg | ORAL_TABLET | Freq: Four times a day (QID) | ORAL | Status: DC | PRN

## 2014-12-11 MED ORDER — ROCURONIUM BROMIDE 50 MG/5ML IV SOLN
INTRAVENOUS | Status: AC
  Filled 2014-12-11: qty 2

## 2014-12-11 MED ORDER — WARFARIN SODIUM 5 MG PO TABS
5.0000 mg | ORAL_TABLET | Freq: Once | ORAL | Status: AC
  Administered 2014-12-11: 5 mg via ORAL
  Filled 2014-12-11: qty 1

## 2014-12-11 MED ORDER — NITROGLYCERIN 0.4 MG SL SUBL: 0.4000 mg | SUBLINGUAL_TABLET | SUBLINGUAL | Status: DC | PRN

## 2014-12-11 MED ORDER — ONDANSETRON HCL 4 MG/2ML IJ SOLN: 4.0000 mg | Freq: Four times a day (QID) | INTRAMUSCULAR | Status: DC | PRN

## 2014-12-11 MED ORDER — HYDROCODONE-ACETAMINOPHEN 5-325 MG PO TABS
1.0000 | ORAL_TABLET | Freq: Four times a day (QID) | ORAL | Status: DC | PRN
  Administered 2014-12-12: 2 via ORAL
  Filled 2014-12-11: qty 2

## 2014-12-11 MED ORDER — GLYCOPYRROLATE 0.2 MG/ML IJ SOLN
INTRAMUSCULAR | Status: AC
  Filled 2014-12-11: qty 3

## 2014-12-11 MED ORDER — METOCLOPRAMIDE HCL 5 MG/ML IJ SOLN
5.0000 mg | Freq: Three times a day (TID) | INTRAMUSCULAR | Status: DC | PRN
Start: 2014-12-11 — End: 2014-12-15

## 2014-12-11 MED ORDER — ETOMIDATE 2 MG/ML IV SOLN
INTRAVENOUS | Status: DC | PRN
  Administered 2014-12-11: 12 mg via INTRAVENOUS

## 2014-12-11 MED ORDER — SODIUM CHLORIDE 0.9 % IV SOLN
INTRAVENOUS | Status: AC
  Administered 2014-12-11: 01:00:00 via INTRAVENOUS

## 2014-12-11 MED ORDER — NEOSTIGMINE METHYLSULFATE 10 MG/10ML IV SOLN
INTRAVENOUS | Status: AC
  Filled 2014-12-11: qty 3

## 2014-12-11 MED ORDER — NEOSTIGMINE METHYLSULFATE 10 MG/10ML IV SOLN
INTRAVENOUS | Status: AC
  Filled 2014-12-11: qty 1

## 2014-12-11 MED ORDER — FENTANYL CITRATE (PF) 250 MCG/5ML IJ SOLN
INTRAMUSCULAR | Status: AC
  Filled 2014-12-11: qty 5

## 2014-12-11 MED ORDER — ONDANSETRON HCL 4 MG/2ML IJ SOLN
INTRAMUSCULAR | Status: AC
Start: 2014-12-11 — End: 2014-12-11
  Filled 2014-12-11: qty 2

## 2014-12-11 MED ORDER — SODIUM CHLORIDE 0.9 % IV SOLN
INTRAVENOUS | Status: DC | PRN
  Administered 2014-12-11: 12:00:00 via INTRAVENOUS

## 2014-12-11 MED ORDER — MUPIROCIN 2 % EX OINT
1.0000 "application " | TOPICAL_OINTMENT | Freq: Two times a day (BID) | CUTANEOUS | Status: DC
  Administered 2014-12-11 – 2014-12-15 (×8): 1 via NASAL
  Filled 2014-12-11: qty 22

## 2014-12-11 MED ORDER — ALBUMIN HUMAN 5 % IV SOLN
INTRAVENOUS | Status: DC | PRN
  Administered 2014-12-11: 13:00:00 via INTRAVENOUS

## 2014-12-11 MED ORDER — SODIUM CHLORIDE 0.9 % IV SOLN
INTRAVENOUS | Status: DC
Start: 2014-12-11 — End: 2014-12-13
  Administered 2014-12-11: via INTRAVENOUS

## 2014-12-11 MED ORDER — CEFAZOLIN SODIUM-DEXTROSE 2-3 GM-% IV SOLR
INTRAVENOUS | Status: AC
  Filled 2014-12-11: qty 50

## 2014-12-11 MED ORDER — SODIUM CHLORIDE 0.9 % IJ SOLN
3.0000 mL | Freq: Two times a day (BID) | INTRAMUSCULAR | Status: DC
  Administered 2014-12-11 – 2014-12-14 (×4): 3 mL via INTRAVENOUS

## 2014-12-11 MED ORDER — ROCURONIUM BROMIDE 100 MG/10ML IV SOLN
INTRAVENOUS | Status: DC | PRN
  Administered 2014-12-11: 35 mg via INTRAVENOUS
  Administered 2014-12-11: 5 mg via INTRAVENOUS

## 2014-12-11 MED ORDER — METHOCARBAMOL 500 MG PO TABS: 500.0000 mg | ORAL_TABLET | Freq: Four times a day (QID) | ORAL | Status: DC | PRN

## 2014-12-11 MED ORDER — MORPHINE SULFATE 2 MG/ML IJ SOLN: 0.5000 mg | INTRAMUSCULAR | Status: DC | PRN

## 2014-12-11 MED ORDER — ACETAMINOPHEN 650 MG RE SUPP: 650.0000 mg | Freq: Four times a day (QID) | RECTAL | Status: DC | PRN

## 2014-12-11 MED ORDER — PHENYLEPHRINE HCL 10 MG/ML IJ SOLN
10.0000 mg | INTRAVENOUS | Status: DC | PRN
  Administered 2014-12-11: 50 ug/min via INTRAVENOUS

## 2014-12-11 MED ORDER — SODIUM CHLORIDE 0.9 % IJ SOLN: 3.0000 mL | INTRAMUSCULAR | Status: DC | PRN

## 2014-12-11 MED ORDER — METHOCARBAMOL 1000 MG/10ML IJ SOLN
500.0000 mg | Freq: Four times a day (QID) | INTRAVENOUS | Status: DC | PRN
  Filled 2014-12-11: qty 5

## 2014-12-11 MED ORDER — FENTANYL CITRATE (PF) 250 MCG/5ML IJ SOLN
INTRAMUSCULAR | Status: DC | PRN
  Administered 2014-12-11: 100 ug via INTRAVENOUS
  Administered 2014-12-11 (×2): 25 ug via INTRAVENOUS

## 2014-12-11 MED ORDER — SODIUM CHLORIDE 0.9 % IJ SOLN
3.0000 mL | Freq: Two times a day (BID) | INTRAMUSCULAR | Status: DC
  Administered 2014-12-11 – 2014-12-14 (×5): 3 mL via INTRAVENOUS

## 2014-12-11 MED ORDER — SODIUM CHLORIDE 0.9 % IV SOLN: Freq: Once | INTRAVENOUS | Status: DC

## 2014-12-11 MED ORDER — VASCULERA PO TABS
1.0000 | ORAL_TABLET | Freq: Every day | ORAL | Status: DC
  Administered 2014-12-12 – 2014-12-15 (×3): 1 via ORAL

## 2014-12-11 MED ORDER — LIDOCAINE HCL (CARDIAC) 20 MG/ML IV SOLN
INTRAVENOUS | Status: AC
  Filled 2014-12-11: qty 5

## 2014-12-11 MED ORDER — CARVEDILOL 3.125 MG PO TABS
3.1250 mg | ORAL_TABLET | Freq: Two times a day (BID) | ORAL | Status: DC
  Administered 2014-12-11: 3.125 mg via ORAL
  Filled 2014-12-11: qty 1

## 2014-12-11 MED ORDER — ETOMIDATE 2 MG/ML IV SOLN
INTRAVENOUS | Status: AC
  Filled 2014-12-11: qty 10

## 2014-12-11 MED ORDER — FUROSEMIDE 10 MG/ML IJ SOLN
INTRAMUSCULAR | Status: AC
  Filled 2014-12-11: qty 4

## 2014-12-11 MED ORDER — HYDROMORPHONE HCL 1 MG/ML IJ SOLN
0.5000 mg | INTRAMUSCULAR | Status: DC | PRN
  Administered 2014-12-11 (×3): 0.5 mg via INTRAVENOUS
  Filled 2014-12-11 (×4): qty 1

## 2014-12-11 MED ORDER — TAMSULOSIN HCL 0.4 MG PO CAPS
0.4000 mg | ORAL_CAPSULE | Freq: Every day | ORAL | Status: DC
  Administered 2014-12-12 – 2014-12-14 (×2): 0.4 mg via ORAL
  Filled 2014-12-11 (×2): qty 1

## 2014-12-11 MED ORDER — LEVOTHYROXINE SODIUM 25 MCG PO TABS
25.0000 ug | ORAL_TABLET | Freq: Every day | ORAL | Status: DC
  Administered 2014-12-11 – 2014-12-15 (×5): 25 ug via ORAL
  Filled 2014-12-11 (×5): qty 1

## 2014-12-11 MED ORDER — MENTHOL 3 MG MT LOZG: 1.0000 | LOZENGE | OROMUCOSAL | Status: DC | PRN

## 2014-12-11 MED ORDER — SODIUM CHLORIDE 0.9 % IV SOLN: 250.0000 mL | INTRAVENOUS | Status: DC | PRN

## 2014-12-11 MED ORDER — ONDANSETRON HCL 4 MG PO TABS: 4.0000 mg | ORAL_TABLET | Freq: Four times a day (QID) | ORAL | Status: DC | PRN

## 2014-12-11 MED ORDER — PHENOL 1.4 % MT LIQD: 1.0000 | OROMUCOSAL | Status: DC | PRN

## 2014-12-11 MED ORDER — PHENYLEPHRINE 40 MCG/ML (10ML) SYRINGE FOR IV PUSH (FOR BLOOD PRESSURE SUPPORT)
PREFILLED_SYRINGE | INTRAVENOUS | Status: AC
  Filled 2014-12-11: qty 10

## 2014-12-11 MED ORDER — GLYCOPYRROLATE 0.2 MG/ML IJ SOLN
INTRAMUSCULAR | Status: DC | PRN
  Administered 2014-12-11: 0.6 mg via INTRAVENOUS

## 2014-12-11 MED ORDER — CEFAZOLIN SODIUM-DEXTROSE 2-3 GM-% IV SOLR
2.0000 g | Freq: Two times a day (BID) | INTRAVENOUS | Status: AC
  Administered 2014-12-11: 2 g via INTRAVENOUS
  Filled 2014-12-11: qty 50

## 2014-12-11 MED ORDER — CHLORHEXIDINE GLUCONATE CLOTH 2 % EX PADS
6.0000 | MEDICATED_PAD | Freq: Every day | CUTANEOUS | Status: AC
  Administered 2014-12-12 – 2014-12-15 (×5): 6 via TOPICAL

## 2014-12-11 MED ORDER — NEOSTIGMINE METHYLSULFATE 10 MG/10ML IV SOLN
INTRAVENOUS | Status: DC | PRN
  Administered 2014-12-11: 4 mg via INTRAVENOUS

## 2014-12-11 MED ORDER — PHENYLEPHRINE HCL 10 MG/ML IJ SOLN
INTRAMUSCULAR | Status: DC | PRN
  Administered 2014-12-11 (×2): 80 ug via INTRAVENOUS

## 2014-12-11 MED ORDER — CEFAZOLIN SODIUM-DEXTROSE 2-3 GM-% IV SOLR
INTRAVENOUS | Status: DC | PRN
  Administered 2014-12-11: 2 g via INTRAVENOUS

## 2014-12-11 MED ORDER — FENOFIBRATE 54 MG PO TABS
54.0000 mg | ORAL_TABLET | Freq: Every day | ORAL | Status: DC
  Administered 2014-12-12 – 2014-12-15 (×3): 54 mg via ORAL
  Filled 2014-12-11 (×5): qty 1

## 2014-12-11 MED ORDER — FUROSEMIDE 10 MG/ML IJ SOLN
20.0000 mg | Freq: Once | INTRAMUSCULAR | Status: AC
  Administered 2014-12-11: 20 mg via INTRAVENOUS

## 2014-12-11 MED ORDER — ONDANSETRON HCL 4 MG/2ML IJ SOLN
INTRAMUSCULAR | Status: DC | PRN
Start: 2014-12-11 — End: 2014-12-11
  Administered 2014-12-11: 4 mg via INTRAVENOUS

## 2014-12-11 MED ORDER — FENTANYL CITRATE (PF) 100 MCG/2ML IJ SOLN: 25.0000 ug | INTRAMUSCULAR | Status: DC | PRN

## 2014-12-11 MED ORDER — METOCLOPRAMIDE HCL 5 MG PO TABS
5.0000 mg | ORAL_TABLET | Freq: Three times a day (TID) | ORAL | Status: DC | PRN
Start: 2014-12-11 — End: 2014-12-15

## 2014-12-11 SURGICAL SUPPLY — 52 items
BIT DRILL LONG 4.0MM (BIT) ×2 IMPLANT
BIT DRILL SHORT 4.0 (BIT) ×1 IMPLANT
BNDG COHESIVE 4X5 TAN NS LF (GAUZE/BANDAGES/DRESSINGS) ×3 IMPLANT
COVER PERINEAL POST (MISCELLANEOUS) ×3 IMPLANT
COVER SURGICAL LIGHT HANDLE (MISCELLANEOUS) ×3 IMPLANT
DRAPE INCISE IOBAN 66X45 STRL (DRAPES) ×3 IMPLANT
DRAPE STERI IOBAN 125X83 (DRAPES) ×3 IMPLANT
DRILL BIT LONG 4.0MM (BIT) ×6
DRILL BIT SHORT 4.0 (BIT) ×2
DRILL STEP  6.4 (BIT) ×3 IMPLANT
DRSG MEPILEX BORDER 4X4 (GAUZE/BANDAGES/DRESSINGS) ×6 IMPLANT
DRSG MEPILEX BORDER 4X8 (GAUZE/BANDAGES/DRESSINGS) ×3 IMPLANT
DRSG PAD ABDOMINAL 8X10 ST (GAUZE/BANDAGES/DRESSINGS) ×6 IMPLANT
DURAPREP 26ML APPLICATOR (WOUND CARE) ×3 IMPLANT
ELECT REM PT RETURN 9FT ADLT (ELECTROSURGICAL) ×3
ELECTRODE REM PT RTRN 9FT ADLT (ELECTROSURGICAL) ×1 IMPLANT
FACESHIELD WRAPAROUND (MASK) ×3 IMPLANT
GAUZE XEROFORM 5X9 LF (GAUZE/BANDAGES/DRESSINGS) ×3 IMPLANT
GLOVE BIO SURGEON STRL SZ8 (GLOVE) ×3 IMPLANT
GLOVE BIOGEL PI IND STRL 8 (GLOVE) ×1 IMPLANT
GLOVE BIOGEL PI INDICATOR 8 (GLOVE) ×2
GLOVE ORTHO TXT STRL SZ7.5 (GLOVE) ×3 IMPLANT
GOWN STRL REUS W/ TWL LRG LVL3 (GOWN DISPOSABLE) ×1 IMPLANT
GOWN STRL REUS W/ TWL XL LVL3 (GOWN DISPOSABLE) ×2 IMPLANT
GOWN STRL REUS W/TWL LRG LVL3 (GOWN DISPOSABLE) ×2
GOWN STRL REUS W/TWL XL LVL3 (GOWN DISPOSABLE) ×4
GUIDE PIN 3.2MM (MISCELLANEOUS) ×2
GUIDE PIN ORTH 343X3.2XBRAD (MISCELLANEOUS) ×1 IMPLANT
GUIDE ROD 3.0 (MISCELLANEOUS) ×3
KIT BASIN OR (CUSTOM PROCEDURE TRAY) ×3 IMPLANT
KIT ROOM TURNOVER OR (KITS) ×3 IMPLANT
LINER BOOT UNIVERSAL DISP (MISCELLANEOUS) ×3 IMPLANT
MANIFOLD NEPTUNE II (INSTRUMENTS) ×3 IMPLANT
NAIL FEMORAL 13X42 (Nail) ×3 IMPLANT
NS IRRIG 1000ML POUR BTL (IV SOLUTION) ×3 IMPLANT
PACK GENERAL/GYN (CUSTOM PROCEDURE TRAY) ×3 IMPLANT
PAD ARMBOARD 7.5X6 YLW CONV (MISCELLANEOUS) ×6 IMPLANT
PAD CAST 4YDX4 CTTN HI CHSV (CAST SUPPLIES) ×2 IMPLANT
PADDING CAST COTTON 4X4 STRL (CAST SUPPLIES) ×4
ROD GUIDE 3.0 (MISCELLANEOUS) ×1 IMPLANT
SCREW 6.4X100 (Screw) ×3 IMPLANT
SCREW 6.4X110 (Screw) ×3 IMPLANT
SCREW TRIGEN LOW PROF 5.0X45 (Screw) ×3 IMPLANT
SCREW TRIGEN LOW PROF 5.0X55 (Screw) ×3 IMPLANT
STAPLER VISISTAT 35W (STAPLE) ×3 IMPLANT
SUT VIC AB 0 CT1 27 (SUTURE) ×4
SUT VIC AB 0 CT1 27XBRD ANBCTR (SUTURE) ×2 IMPLANT
SUT VIC AB 2-0 CT1 27 (SUTURE) ×4
SUT VIC AB 2-0 CT1 TAPERPNT 27 (SUTURE) ×2 IMPLANT
TOWEL OR 17X24 6PK STRL BLUE (TOWEL DISPOSABLE) ×3 IMPLANT
TOWEL OR 17X26 10 PK STRL BLUE (TOWEL DISPOSABLE) ×3 IMPLANT
WATER STERILE IRR 1000ML POUR (IV SOLUTION) ×3 IMPLANT

## 2014-12-11 NOTE — Brief Op Note (Signed)
12/10/2014 - 12/11/2014  2:15 PM  PATIENT:  Jenelle Mages Opheim Sr.  81 y.o. male  PRE-OPERATIVE DIAGNOSIS:  Fractured right femur  POST-OPERATIVE DIAGNOSIS:  Fractured right femur  PROCEDURE:  Procedure(s): INTRAMEDULLARY (IM) NAIL FEMORAL (Right)  SURGEON:  Surgeon(s) and Role:    * Mcarthur Rossetti, MD - Primary  PHYSICIAN ASSISTANT: Benita Stabile, PA-C  ANESTHESIA:   general  EBL:  Total I/O In: 250 [IV Piggyback:250] Out: 1400 [Urine:900; Blood:500]  BLOOD ADMINISTERED:none  DRAINS: none   LOCAL MEDICATIONS USED:  NONE  SPECIMEN:  No Specimen  DISPOSITION OF SPECIMEN:  N/A  COUNTS:  YES  TOURNIQUET:  * No tourniquets in log *  DICTATION: .Other Dictation: Dictation Number [id number]  PLAN OF CARE: Admit to inpatient   PATIENT DISPOSITION:  PACU - hemodynamically stable.   Delay start of Pharmacological VTE agent (>24hrs) due to surgical blood loss or risk of bleeding: no

## 2014-12-11 NOTE — Anesthesia Procedure Notes (Signed)
Procedure Name: Intubation Date/Time: 12/11/2014 12:19 PM Performed by: Garrison Columbus T Pre-anesthesia Checklist: Patient identified, Emergency Drugs available, Suction available and Patient being monitored Patient Re-evaluated:Patient Re-evaluated prior to inductionOxygen Delivery Method: Circle system utilized Preoxygenation: Pre-oxygenation with 100% oxygen Intubation Type: IV induction Ventilation: Mask ventilation without difficulty Laryngoscope Size: Miller and 2 Grade View: Grade I Tube type: Oral Tube size: 7.5 mm Number of attempts: 1 Airway Equipment and Method: Stylet Placement Confirmation: ETT inserted through vocal cords under direct vision,  positive ETCO2 and breath sounds checked- equal and bilateral Secured at: 23 cm Tube secured with: Tape Dental Injury: Teeth and Oropharynx as per pre-operative assessment

## 2014-12-11 NOTE — Op Note (Signed)
NAMEJACY, HOWAT NO.:  [id number]  MEDICAL RECORD NO.:  [id number]  LOCATION:  5N31C                        FACILITY:  [facility]  PHYSICIAN:  Lind Guest. Ninfa Linden, M.D.DATE OF BIRTH:  1919-08-31  DATE OF PROCEDURE:  12/11/2014 DATE OF DISCHARGE:                              OPERATIVE REPORT   PREOPERATIVE DIAGNOSIS:  Right proximal femur subtrochanteric fracture.  POSTOPERATIVE DIAGNOSES:  Right proximal femur subtrochanteric fracture.  PROCEDURE:  Open reduction and fixation of right femur fracture using reconstruction and intramedullary nail.  IMPLANTS:  [employer] TAN nail measuring 13 x 42 with two 6.4-mm recon screws proximally and two 5.0 distal interlocking screws.  SURGEON:  Lind Guest. Ninfa Linden, M.D.  ASSISTING:  Erskine Emery, PA-C  ANESTHESIA:  General.  ANTIBIOTICS:  2 g IV Ancef.  BLOOD LOSS:  500 mL.  COMPLICATIONS:  None.  INDICATIONS:  Mr. David is a 81 year old, very frail individual, who tripped with his walker at home last evening sustaining a right proximal femur fracture.  He had significant deformity of his leg and was brought to the [facility] Emergency Room by his family.  He was found to have a significantly displaced proximal femur fracture.  He was also on Coumadin with an INR of 2.0 and multiple medical problems.  He is admitted to the Medicine Service.  We were able to reverse his anticoagulation and he is presenting for surgery today.  He has had medical and cardiac clearance.  I have talked to him and his wife as well as daughter in length and they understand the risk of surgery with the need for the surgery given this significant displaced nature of the fracture and the tenting of this into the skin, the goal was of hopefully helping with him from mobility and quality of life standpoint.  PROCEDURE DESCRIPTION:  After informed consent was obtained, appropriate right femur was marked.  He was brought to  the operating room.  General anesthesia was obtained while he was on the stretcher.  Next, he was placed supine on the fracture table with his right operative leg in in- line with skeletal traction.  Perineal post in place and then left leg in a well-leg holder with the hip actually extended and knee flexed.  We were able to assess the fracture fluoroscopically and get it close to reduce, but I knew that I had to open up the fracture site and place a temporary holding claw device to hold it in place to get it aligned as well for the rod.  A time-out was called to identify correct patient, correct right hip, and this was after prepping and draping with DuraPrep and sterile drapes, the whole femur from above the hip down to the ankle.  We first made incision proximal to the greater trochanter and dissected down the tip of the greater trochanter.  I put a temporary guide pin down the leg and was able to cross the fracture site; however, due to displacement of the fracture site, I then made an incision over the lateral femur, opened up the IT band and placed a temporary [place] claw around the femur to hold it anatomically reduced.  I then  began reaming under 5 mm increments all the way up to a 14 reamer.  This was due to his very elderly bone.  We then placed a 13 x 42 cm femoral nail over the guide rod and removed the guide rod.  Under fluoroscopic guidance, we then placed two 6.4-mm recon screws up into the femoral head and neck and 2 distal interlocking screws from anterior to posterior, all through separate incisions.  I then removed the clamp around the femur and held it and it stayed anatomically reduced.  We then washed all sites with normal saline solution.  Closed the deep tissue with 0 Vicryl followed by 2-0 Vicryl in subcutaneous tissue interrupted, staples on the skin and all incisions.  He was taken off the fracture table, awakened, extubated, and taken to recovery room in stable  condition.  Of note, Erskine Emery, PA-C, assisted in the entire case and his assistance was crucial to help facilitate in all aspects of this case.     Lind Guest. Ninfa Linden, M.D.     CYB/MEDQ  D:  12/11/2014  T:  12/11/2014  Job:  [id number]

## 2014-12-11 NOTE — Anesthesia Preprocedure Evaluation (Addendum)
Anesthesia Evaluation  Patient identified by MRN, date of birth, ID band Patient awake    Airway Mallampati: II  TM Distance: >3 FB Neck ROM: Full    Dental  (+) Poor Dentition, Dental Advisory Given, Chipped, Loose,    Pulmonary sleep apnea , pneumonia -,  breath sounds clear to auscultation        Cardiovascular hypertension, Pt. on home beta blockers + CAD, + Past MI, + Cardiac Stents and + CABG + dysrhythmias Atrial Fibrillation Rhythm:Regular Rate:Normal     Neuro/Psych CVA    GI/Hepatic Neg liver ROS, GERD-  Medicated,  Endo/Other  Hypothyroidism   Renal/GU CRFRenal disease     Musculoskeletal   Abdominal   Peds  Hematology  (+) anemia ,   Anesthesia Other Findings   Reproductive/Obstetrics                      Anesthesia Physical Anesthesia Plan  ASA: IV  Anesthesia Plan: General   Post-op Pain Management:    Induction: Intravenous  Airway Management Planned: Oral ETT  Additional Equipment: Arterial line  Intra-op Plan:   Post-operative Plan: Possible Post-op intubation/ventilation  Informed Consent:   Dental advisory given  Plan Discussed with: CRNA and Anesthesiologist  Anesthesia Plan Comments:       Anesthesia Quick Evaluation

## 2014-12-11 NOTE — Progress Notes (Signed)
Orthopedic Tech Progress Note Patient Details:  [name] Decicco Dygert Sr. 07/24/1920 [id number]  Pt unable to use trapeze bar patient helper  Hildred Priest 12/11/2014, 4:48 PM

## 2014-12-11 NOTE — Anesthesia Postprocedure Evaluation (Signed)
  Anesthesia Post-op Note  Patient: [name] G Couchman Sr.  Procedure(s) Performed: Procedure(s): INTRAMEDULLARY (IM) NAIL FEMORAL (Right)  Patient Location: PACU  Anesthesia Type:General  Level of Consciousness: awake  Airway and Oxygen Therapy: Patient Spontanous Breathing  Post-op Pain: mild  Post-op Assessment: Post-op Vital signs reviewed  Post-op Vital Signs: Reviewed  Last Vitals:  Filed Vitals:   12/11/14 1538  BP: 134/46  Pulse: 88  Temp:   Resp: 22    Complications: No apparent anesthesia complications

## 2014-12-11 NOTE — Consult Note (Signed)
CARDIOLOGY CONSULT NOTE   Patient ID: [name] Mages Morash Sr. MRN: [id number] DOB/AGE: 1919-12-20 81 y.o.  Admit Date: 12/10/2014 Referring Physician: Katy Fitch MD-Ortho Primary Physician: Scarlette Calico, MD Consulting Cardiologist: Hollace Hayward MD Primary Cardiologist Minus Breeding Reason for Consultation: Pre-Op evaluation, positive troponin after fall with known CAD and Afib  Clinical Summary [name] is a 81 y.o.male with known history of CAD, atrial fib on coumadin,  ICM, combined systolic and diastolic CHF EF of 72% per echo in 01/2011,, CVA, hypertension, who presented to hospital after falling at home and sustaining a subtrochanteric fx of the right femur. Troponin was found to be mildly elevated with 0.03;0.07;0.08 respectively. We are asked to see patient emergently due to planned surgery this am.   The patient's wife states that he does not always use his walker, and was walking in his home, turned his leg funny and fell. He has a history of falling in the past several times. His son states that Dr. Jillyn Ledger office follows his coumadin but he was not getting the full dose as there was a mix up in communication. INR was 2.03 on admission, he was given Vitamin K with INR 1.71 this am. Hx is obtained from family at bedside and records, as patient has been sedated.    Allergies  Allergen Reactions  . Ace Inhibitors     Cannot remember the reaction    Medications Scheduled Medications: . [MAR Hold] carvedilol  3.125 mg Oral BID WC  . [MAR Hold] fenofibrate  54 mg Oral Daily  . [MAR Hold] levothyroxine  25 mcg Oral QAC breakfast  . [MAR Hold] sodium chloride  3 mL Intravenous Q12H  . [MAR Hold] sodium chloride  3 mL Intravenous Q12H  . [MAR Hold] tamsulosin  0.4 mg Oral QPC supper  . [MAR Hold] VASCULERA  1 tablet Oral Daily    Infusions: . sodium chloride 75 mL/hr at 12/11/14 0045    PRN Medications: [MAR Hold] sodium chloride, [MAR Hold] acetaminophen **OR**  [MAR Hold] acetaminophen, [MAR Hold] fentaNYL (SUBLIMAZE) injection, [MAR Hold]  HYDROmorphone (DILAUDID) injection, [MAR Hold] nitroGLYCERIN, [MAR Hold] sodium chloride   Past Medical History  Diagnosis Date  . Coronary artery disease     a.  s/p MI;  b. s/p CABG 2007;  c. cath 12/08: LM occluded, pRCA 80%, dRCA 50%, PLB 90%, S-PDA ok, S-Dx ok with occluded dist Dx; L-LAD ok, S-OM ok with 90% after graft insertion . . . . distal OM treated with DES  . Ischemic cardiomyopathy     echo 6/12 EF 35-40%, apical, dAnt, mid to dist inf-lat HK, mild AI, mod MR, mod BAE, PASP 44  . Chronic combined systolic and diastolic heart failure     admx 8/13;   . Hypertension   . Atrial fibrillation     a. on coumadin.  . CKD (chronic kidney disease), stage IV   . CVA (cerebral infarction)     left frontal  . Anemia     chronic  . Hypothyroidism   . Leg muscle spasm   . GERD (gastroesophageal reflux disease)   . Skin cancer     Past Surgical History  Procedure Laterality Date  . Coronary artery bypass graft  2007  . Coronary angioplasty with stent placement      Family History  Problem Relation Age of Onset  . Cancer Mother   . Stroke Father     Social History [name] reports that he has never  smoked. He has never used smokeless tobacco. [name] reports that he does not drink alcohol.  Review of Systems Complete review of systems are found to be negative unless outlined in H&P above.  Physical Examination Blood pressure 134/54, pulse 77, temperature 97.5 F (36.4 C), temperature source Oral, resp. rate 18, weight 166 lb 9.6 oz (75.569 kg), SpO2 100 %.  Intake/Output Summary (Last 24 hours) at 12/11/14 1058 Last data filed at 12/11/14 0827  Gross per 24 hour  Intake 318.75 ml  Output    650 ml  Net -331.25 ml    Telemetry: Atrial fib, RBBB, occasional PVC.  XHB:ZJIRCVE but able to respond.  HEENT: Conjunctiva and lids normal, oropharynx clear with moist mucosa. Neck:  Supple, no elevated JVP or carotid bruits, no thyromegaly. Lungs: Clear to auscultation, nonlabored breathing at rest. Cardiac: Irregular rate and rhythm, distant heart sounds Abdomen: Soft, nontender, no hepatomegaly, bowel sounds present, no guarding or rebound. Extremities: No pitting edema, distal pulses 2+. Right leg in Buck's traction.  Skin: Warm and dry. Musculoskeletal: No kyphosis. Neuropsychiatric: Sedated but responsive, affect grossly appropriate.  Prior Cardiac Testing/Procedures 1. Echocardiogram 02/22/2011 Left ventricle: The cavity size was normal. Systolic function was moderately reduced. The estimated ejection fraction was in the range of 35% to 40%. There is severe hypokinesis of the apical myocardium. There is severe hypokinesis of the distal anterior myocardium. There is severe hypokinesis of the mid-distal inferolateral myocardium. - Aortic valve: Mild regurgitation. - Mitral valve: Moderate regurgitation. - Left atrium: The atrium was moderately dilated. - Right atrium: The atrium was moderately dilated. - Pulmonary arteries: Systolic pressure was mildly increased. PA peak pressure: 61mm Hg (S).  Lab Results  Basic Metabolic Panel:  Recent Labs Lab 12/10/14 2034 12/11/14 0555  NA 139 140  K 4.1 4.3  CL 106 105  CO2 20 19  GLUCOSE 152* 151*  BUN 70* 67*  CREATININE 2.83* 2.88*  CALCIUM 8.6 8.3*    Liver Function Tests:  Recent Labs Lab 12/11/14 0555  AST 21  ALT 13  ALKPHOS 31*  BILITOT 1.2  PROT 6.6  ALBUMIN 3.6    CBC:  Recent Labs Lab 12/10/14 2034 12/11/14 0555  WBC 11.5* 8.6  NEUTROABS 9.1* 7.0  HGB 10.8* 9.7*  HCT 32.8* 29.9*  MCV 90.1 92.0  PLT 123* 116*    Cardiac Enzymes:  Recent Labs Lab 12/10/14 2235 12/11/14 0555 12/11/14 0900  TROPONINI 0.03 0.07* 0.08*    Radiology: Dg Pelvis 1-2 Views  12/10/2014   CLINICAL DATA:  [place] today.  Right leg pain.  EXAM: RIGHT FEMUR 2 VIEWS; PELVIS - 1-2 VIEW   COMPARISON:  None.  FINDINGS: There is a comminuted displaced subtrochanteric fracture of the femur. No definite pelvic fractures. Both hips are normally located.  IMPRESSION: Displaced subtrochanteric fracture of the right femur.   Electronically Signed   By: Marijo Sanes M.D.   On: 12/10/2014 21:35   Ct Head Wo Contrast  12/10/2014   CLINICAL DATA:  Found on the floor after falling at home.  EXAM: CT HEAD WITHOUT CONTRAST  CT CERVICAL SPINE WITHOUT CONTRAST  TECHNIQUE: Multidetector CT imaging of the head and cervical spine was performed following the standard protocol without intravenous contrast. Multiplanar CT image reconstructions of the cervical spine were also generated.  COMPARISON:  07/14/2014  FINDINGS: CT HEAD FINDINGS  There is generalized brain atrophy. There is chronic small vessel disease throughout the deep white matter. There is old bifrontal cortical and subcortical infarction,  left worse than right. There is chronic temporal and occipital atrophy with prominence of the ventricles. No sign of acute infarction, mass lesion, hemorrhage, obstructive hydrocephalus or extra-axial collection. No skull fracture. No traumatic fluid in the sinuses, middle ears or mastoids. Chronic sclerosis in the right mastoid region.  CT CERVICAL SPINE FINDINGS  No cervical fracture. No traumatic malalignment. No soft tissue swelling.  There are prominent bridging osteophytes consistent with spondylosis 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 There is ordinary facet osteoarthritis.  IMPRESSION: Head CT: Atrophy and chronic ischemic changes. No acute or traumatic finding.  Cervical spine CT: Chronic spondylosis and ankylosis. No acute or traumatic finding.   Electronically Signed   By: Nelson Chimes M.D.   On: 12/10/2014 21:57   Ct Cervical Spine Wo Contrast  12/10/2014   CLINICAL DATA:  Found on the floor after falling at home.  EXAM: CT HEAD WITHOUT CONTRAST  CT CERVICAL SPINE WITHOUT CONTRAST   TECHNIQUE: Multidetector CT imaging of the head and cervical spine was performed following the standard protocol without intravenous contrast. Multiplanar CT image reconstructions of the cervical spine were also generated.  COMPARISON:  07/14/2014  FINDINGS: CT HEAD FINDINGS  There is generalized brain atrophy. There is chronic small vessel disease throughout the deep white matter. 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 There is chronic temporal and occipital atrophy with prominence of the ventricles. No sign of acute infarction, mass lesion, hemorrhage, obstructive hydrocephalus or extra-axial collection. No skull fracture. No traumatic fluid in the sinuses, middle ears or mastoids. Chronic sclerosis in the right mastoid region.  CT CERVICAL SPINE FINDINGS  No cervical fracture. No traumatic malalignment. No soft tissue swelling.  There are prominent bridging osteophytes consistent with spondylosis 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 There is ordinary facet osteoarthritis.  IMPRESSION: Head CT: Atrophy and chronic ischemic changes. No acute or traumatic finding.  Cervical spine CT: Chronic spondylosis and ankylosis. No acute or traumatic finding.   Electronically Signed   By: Nelson Chimes M.D.   On: 12/10/2014 21:57   Dg Chest Port 1 View  12/10/2014   CLINICAL DATA:  Preop femur fracture repair.  EXAM: PORTABLE CHEST - 1 VIEW  COMPARISON:  07/14/2014  FINDINGS: Patient is post median sternotomy. Cardiomediastinal contours are unchanged with mild cardiomegaly, heart size likely accentuated by technique. Lungs remain hyperinflated. No pulmonary edema, consolidation, pleural effusion or pneumothorax. The bones are diffusely under mineralized.  IMPRESSION: Stable chronic change without acute process.   Electronically Signed   By: Jeb Levering M.D.   On: 12/10/2014 23:45   Dg Femur, Min 2 Views Right  12/10/2014   CLINICAL DATA:  [place]  today.  Right leg pain.  EXAM: RIGHT FEMUR 2 VIEWS; PELVIS - 1-2 VIEW  COMPARISON:  None.  FINDINGS: There is a comminuted displaced subtrochanteric fracture of the femur. No definite pelvic fractures. Both hips are normally located.  IMPRESSION: Displaced subtrochanteric fracture of the right femur.   Electronically Signed   By: Marijo Sanes M.D.   On: 12/10/2014 21:35     ECG: Atrial fib RBBB, PVC's. Rate of 89 bpm. Some lateral ST T wave abnormalities.   Impression and Recommendations  1. Positive Troponin: Likely related to fall. No evidence of acute ST-T wave abnormalities on EKG. He is of acceptable risk to proceed with surgical repair of right femur. He has been seen and examined by Dr. Keene Breath who has spoken with Dr.  Edwards.   2. CAD: Would continue coreg 3.125 mg BID perioperatively. No plans for cardiac testing pre-operatively,  3. Atrial fib: Coumadin on hold. Would recommend heparin post op bridge with hx of CVA and ICM back to coumadin post operatively. CHADS VASC Score of 5.   4. Ischemic Cardiomyopathy: EF of 35%-40%. Would avoid excessive fluid hydration perioperatively to avoid CHF. Would place back on lasix 40 mg daily. If being kept NPO post op, would begin IV lasix 20-40 mg daily depending on fluid status. Check BMET in am. CXR in am.    Signed: Phill Myron. Lawrence NP [place]  12/11/2014, 10:58 AM  I have seen and examined the patient along with Phill Myron. Lawrence NP [place].  I have reviewed the chart, notes and new data.  I agree with NP's note.  PLAN: [name] has serious chronic cardiovascular problems, but they all appear to be well compensated at this time. No recent angina, lying fully supine without dyspnea or clinical signs of CHF, atrial fibrillation with good rate control.  His age and comorbid conditions place him at moderate risk for serious CV complications, but I think this as close to optimal compensation as we will get. The risks of conservative  management of hip fracture are very high. There would be no benefit in delaying planned hip surgery  Sanda Klein, MD, [facility] [phone] 12/11/2014, 12:39 PM

## 2014-12-11 NOTE — Transfer of Care (Signed)
Immediate Anesthesia Transfer of Care Note  Patient: [name].  Procedure(s) Performed: Procedure(s): INTRAMEDULLARY (IM) NAIL FEMORAL (Right)  Patient Location: PACU  Anesthesia Type:General  Level of Consciousness: awake, alert , oriented and patient cooperative  Airway & Oxygen Therapy: Patient Spontanous Breathing and Patient connected to face mask oxygen  Post-op Assessment: Report given to RN, Post -op Vital signs reviewed and stable and Patient moving all extremities X 4  Post vital signs: Reviewed and stable  Last Vitals:  Filed Vitals:   12/11/14 0830  BP: 134/54  Pulse: 77  Temp: 36.4 C  Resp: 18    Complications: No apparent anesthesia complications

## 2014-12-11 NOTE — Progress Notes (Signed)
ANTICOAGULATION CONSULT NOTE - Initial Consult  Pharmacy Consult for coumadin Indication: VTE prophylaxis  Allergies  Allergen Reactions  . Ace Inhibitors     Cannot remember the reaction    Patient Measurements: Weight: 166 lb 9.6 oz (75.569 kg) Heparin Dosing Weight:   Vital Signs: Temp: 97.5 F (36.4 C) (04/16 1535) Temp Source: Oral (04/16 1445) BP: 134/46 mmHg (04/16 1538) Pulse Rate: 88 (04/16 1538)  Labs:  Recent Labs  12/10/14 2034 12/10/14 2235 12/11/14 0555 12/11/14 0900 12/11/14 1401  HGB 10.8*  --  9.7*  --  7.5*  HCT 32.8*  --  29.9*  --  22.0*  PLT 123*  --  116*  --   --   LABPROT 23.1*  --   --  20.2*  --   INR 2.03*  --   --  1.71*  --   CREATININE 2.83*  --  2.88*  --   --   TROPONINI  --  0.03 0.07* 0.08*  --     Estimated Creatinine Clearance: 17.4 mL/min (by C-G formula based on Cr of 2.88).   Medical History: Past Medical History  Diagnosis Date  . Coronary artery disease     a.  s/p MI;  b. s/p CABG 2007;  c. cath 12/08: LM occluded, pRCA 80%, dRCA 50%, PLB 90%, S-PDA ok, S-Dx ok with occluded dist Dx; L-LAD ok, S-OM ok with 90% after graft insertion . . . . distal OM treated with DES  . Ischemic cardiomyopathy     echo 6/12 EF 35-40%, apical, dAnt, mid to dist inf-lat HK, mild AI, mod MR, mod BAE, PASP 44  . Chronic combined systolic and diastolic heart failure     admx 8/13;   . Hypertension   . Atrial fibrillation     a. on coumadin.  . CKD (chronic kidney disease), stage IV   . CVA (cerebral infarction)     left frontal  . Anemia     chronic  . Hypothyroidism   . Leg muscle spasm   . GERD (gastroesophageal reflux disease)   . Skin cancer     Medications:  Prescriptions prior to admission  Medication Sig Dispense Refill Last Dose  . carvedilol (COREG) 3.125 MG tablet Take 1 tablet (3.125 mg total) by mouth 2 (two) times daily with a meal. 60 tablet 5 12/10/2014 at 1000  . Dietary Management Product (VASCULERA PO) Take 1  tablet by mouth daily. 630 mg tablet   12/09/2014 at Unknown time  . fenofibrate 54 MG tablet TAKE 1 TABLET BY MOUTH DAILY 30 tablet 5 12/10/2014 at Unknown time  . furosemide (LASIX) 20 MG tablet TAKE 2 TABLETS BY MOUTH DAILY 60 tablet 3 12/10/2014 at Unknown time  . levothyroxine (SYNTHROID, LEVOTHROID) 25 MCG tablet Take 25 mcg by mouth daily before breakfast.   12/10/2014 at Unknown time  . nitroGLYCERIN (NITROSTAT) 0.4 MG SL tablet Place 1 tablet (0.4 mg total) under the tongue every 5 (five) minutes as needed for chest pain. 25 tablet 1 unknown at unknown  . RAPAFLO 8 MG CAPS capsule TAKE 1 CAPLET BY MOUTH DAILY WITH BREAKFAST. 90 capsule 3 Past Month at Unknown time  . warfarin (COUMADIN) 2.5 MG tablet Take 1 to 1.5 tablets by mouth daily as directed by coumadin clinic (Patient taking differently: Take 2.5 mg by mouth See admin instructions. Take 1 every day except on Monday and Thursday take  1.5 tablets by mouth daily as directed by coumadin clinic.) 40 tablet  5 12/10/2014 at Unknown time  . Dietary Management Product (VASCULERA) TABS TAKE 1 TABLET BY MOUTH DAILY (Patient not taking: Reported on 12/10/2014) 90 tablet 3 Not Taking at Unknown time  . neomycin-bacitracin-polymyxin (NEOSPORIN) ointment Apply 1 application topically every 12 (twelve) hours. apply to eye (Patient not taking: Reported on 12/10/2014) 15 g 0 Not Taking at Unknown time   Scheduled:  . sodium chloride   Intravenous Once  . carvedilol  3.125 mg Oral BID WC  . [START ON 12/12/2014]  ceFAZolin (ANCEF) IV  2 g Intravenous Q12H  . fenofibrate  54 mg Oral Daily  . levothyroxine  25 mcg Oral QAC breakfast  . sodium chloride  3 mL Intravenous Q12H  . sodium chloride  3 mL Intravenous Q12H  . tamsulosin  0.4 mg Oral QPC supper  . VASCULERA  1 tablet Oral Daily   Infusions:  . sodium chloride      Assessment: 81 yo who was admitted for a femur fx. He is s/p surgery. Pt has a hx of afib and has been on coumadin for  anticoagulation. His admission INR was 2.03 yesterday. He received vit k 10mg  IV x1. It has decreased his INR to 1.71 today. It'll likely make his INR more resistant now with that dose. Coumadin has been ordered to be resumed post op.   Goal of Therapy:  INR 2-3 Monitor platelets by anticoagulation protocol: Yes   PTA = Coumadin 3.75mg  qday except 2.5mg  MTh   Plan:   Coumadin 5mg  PO x1 Daily INR  Onnie Boer, PharmD Pager: [phone] 12/11/2014 4:25 PM

## 2014-12-11 NOTE — Progress Notes (Signed)
PATIENT DETAILS Name: [name] Amos Sr. Age: 81 y.o. Sex: male Date of Birth: 12/30/1919 Admit Date: 12/10/2014 Admitting Physician Jani Gravel, MD JQB:HALPFX Ronnald Ramp, MD  Subjective: Just cam back from PACU, mildly confused, complains of pain at operative site.  Assessment/Plan: Active Problems: Closed displaced spiral fracture of shaft of right femur:secondary to a mechanical fall. Seen by cards pre-operatively, then underwent repair of left hip on 4/16. Per op note, 500 cc of blood loss, getting 2 units of PRBC. Continue supportive care. Suspect will need SNF on discharge  Acute perioperative blood loss anemia:getting transfused 2 units of PRBC, follow CBC.   Atrial Fibrillation:continue rate control with Coreg, resume Coumadin when ok with Ortho  Chronic systolic CHF:EF of 90%-24% by Echo in 2012. Will need to be careful with IVF,clinically compensated at this time. Continue Coreg, not a candidate for ACEI/ARB given CKD stage 4. Resume Lasix when oral intake more stable.   OXB:DZHGDJM not on ASA as on coumadin. Minimal troponin elevated is likely secondary to CKD  CKD Stage 4:creatinine close to usual baseline. Monitor lytes closely  Dyslipidemia:continue Fenofibrate  Hypothyroidism:continue Synthroid  EQA:STMHDQQI Rapaflo.  Disposition: Remain inpatient  Antibiotics:  See below   Anti-infectives    Start     Dose/Rate Route Frequency Ordered Stop   12/12/14 0030  ceFAZolin (ANCEF) IVPB 2 g/50 mL premix     2 g 100 mL/hr over 30 Minutes Intravenous Every 12 hours 12/11/14 1613 12/12/14 0959      DVT Prophylaxis: coumadin  Code Status: Full code -family will discuss code status and let us know  Family Communication Daughter/Spouse at bedside  Procedures:  4/16:Intramedullary nail of righ hip  CONSULTS:  cardiology and orthopedic surgery  Time spent 40 minutes-which includes 50% of the time with face-to-face with patient/ family and coordinating  care related to the above assessment and plan.  MEDICATIONS: Scheduled Meds: . sodium chloride   Intravenous Once  . carvedilol  3.125 mg Oral BID WC  . [START ON 12/12/2014]  ceFAZolin (ANCEF) IV  2 g Intravenous Q12H  . fenofibrate  54 mg Oral Daily  . levothyroxine  25 mcg Oral QAC breakfast  . sodium chloride  3 mL Intravenous Q12H  . sodium chloride  3 mL Intravenous Q12H  . tamsulosin  0.4 mg Oral QPC supper  . VASCULERA  1 tablet Oral Daily   Continuous Infusions: . sodium chloride     PRN Meds:.sodium chloride, acetaminophen **OR** acetaminophen, fentaNYL (SUBLIMAZE) injection, HYDROcodone-acetaminophen, HYDROmorphone (DILAUDID) injection, menthol-cetylpyridinium **OR** phenol, methocarbamol **OR** methocarbamol (ROBAXIN)  IV, metoCLOPramide **OR** metoCLOPramide (REGLAN) injection, morphine injection, nitroGLYCERIN, ondansetron **OR** ondansetron (ZOFRAN) IV, sodium chloride    PHYSICAL EXAM: Vital signs in last 24 hours: Filed Vitals:   12/11/14 1515 12/11/14 1523 12/11/14 1535 12/11/14 1538  BP:  147/48  134/46  Pulse: 101 87 88 88  Temp:   97.5 F (36.4 C)   TempSrc:      Resp: 19 15 17 22   Weight:      SpO2: 100% 100% 100% 100%    Weight change:  Filed Weights   12/11/14 0455  Weight: 75.569 kg (166 lb 9.6 oz)   Body mass index is 23.25 kg/(m^2).   Gen Exam: Awake, mildly confused Neck: Supple, No JVD.   Chest: B/L Clear.   CVS: S1 S2 Regular, no murmurs.  Abdomen: soft, BS +, non tender, non distended.  Extremities: no edema, lower extremities warm to touch.  Neurologic: Non Focal.   Skin: No Rash.   Wounds: N/A.   Intake/Output from previous day:  Intake/Output Summary (Last 24 hours) at 12/11/14 1626 Last data filed at 12/11/14 1518  Gross per 24 hour  Intake 1803.75 ml  Output   1750 ml  Net  53.75 ml     LAB RESULTS: CBC  Recent Labs Lab 12/10/14 2034 12/11/14 0555 12/11/14 1401  WBC 11.5* 8.6  --   HGB 10.8* 9.7* 7.5*  HCT  32.8* 29.9* 22.0*  PLT 123* 116*  --   MCV 90.1 92.0  --   MCH 29.7 29.8  --   MCHC 32.9 32.4  --   RDW 14.8 14.8  --   LYMPHSABS 1.4 0.9  --   MONOABS 0.7 0.8  --   EOSABS 0.3 0.0  --   BASOSABS 0.0 0.0  --     Chemistries   Recent Labs Lab 12/10/14 2034 12/11/14 0555 12/11/14 1401  NA 139 140 142  K 4.1 4.3 3.9  CL 106 105  --   CO2 20 19  --   GLUCOSE 152* 151* 148*  BUN 70* 67*  --   CREATININE 2.83* 2.88*  --   CALCIUM 8.6 8.3*  --     CBG:  Recent Labs Lab 12/10/14 2030  GLUCAP 146*    GFR Estimated Creatinine Clearance: 17.4 mL/min (by C-G formula based on Cr of 2.88).  Coagulation profile  Recent Labs Lab 12/07/14 1602 12/10/14 2034 12/11/14 0900  INR 1.8 2.03* 1.71*    Cardiac Enzymes  Recent Labs Lab 12/10/14 2235 12/11/14 0555 12/11/14 0900  TROPONINI 0.03 0.07* 0.08*    Invalid input(s): POCBNP No results for input(s): DDIMER in the last 72 hours. No results for input(s): HGBA1C in the last 72 hours. No results for input(s): CHOL, HDL, LDLCALC, TRIG, CHOLHDL, LDLDIRECT in the last 72 hours. No results for input(s): TSH, T4TOTAL, T3FREE, THYROIDAB in the last 72 hours.  Invalid input(s): FREET3 No results for input(s): VITAMINB12, FOLATE, FERRITIN, TIBC, IRON, RETICCTPCT in the last 72 hours. No results for input(s): LIPASE, AMYLASE in the last 72 hours.  Urine Studies No results for input(s): UHGB, CRYS in the last 72 hours.  Invalid input(s): UACOL, UAPR, USPG, UPH, UTP, UGL, UKET, UBIL, UNIT, UROB, ULEU, UEPI, UWBC, URBC, UBAC, CAST, UCOM, BILUA  MICROBIOLOGY: Recent Results (from the past 240 hour(s))  Surgical pcr screen     Status: Abnormal   Collection Time: 12/11/14  8:38 AM  Result Value Ref Range Status   MRSA, PCR NEGATIVE NEGATIVE Final   Staphylococcus aureus POSITIVE (A) NEGATIVE Final    Comment:        The Xpert SA Assay (FDA approved for NASAL specimens in patients over 38 years of age), is one component  of a comprehensive surveillance program.  Test performance has been validated by [facility] for patients greater than or equal to 76 year old. It is not intended to diagnose infection nor to guide or monitor treatment.     RADIOLOGY STUDIES/RESULTS: Dg Pelvis 1-2 Views  12/10/2014   CLINICAL DATA:  [place] today.  Right leg pain.  EXAM: RIGHT FEMUR 2 VIEWS; PELVIS - 1-2 VIEW  COMPARISON:  None.  FINDINGS: There is a comminuted displaced subtrochanteric fracture of the femur. No definite pelvic fractures. Both hips are normally located.  IMPRESSION: Displaced subtrochanteric fracture of the right femur.   Electronically Signed   By: Marijo Sanes M.D.   On:  12/10/2014 21:35   Ct Head Wo Contrast  12/10/2014   CLINICAL DATA:  Found on the floor after falling at home.  EXAM: CT HEAD WITHOUT CONTRAST  CT CERVICAL SPINE WITHOUT CONTRAST  TECHNIQUE: Multidetector CT imaging of the head and cervical spine was performed following the standard protocol without intravenous contrast. Multiplanar CT image reconstructions of the cervical spine were also generated.  COMPARISON:  07/14/2014  FINDINGS: CT HEAD FINDINGS  There is generalized brain atrophy. There is chronic small vessel disease throughout the deep white matter. 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 There is chronic temporal and occipital atrophy with prominence of the ventricles. No sign of acute infarction, mass lesion, hemorrhage, obstructive hydrocephalus or extra-axial collection. No skull fracture. No traumatic fluid in the sinuses, middle ears or mastoids. Chronic sclerosis in the right mastoid region.  CT CERVICAL SPINE FINDINGS  No cervical fracture. No traumatic malalignment. No soft tissue swelling.  There are prominent bridging osteophytes consistent with spondylosis 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 There is ordinary facet osteoarthritis.  IMPRESSION: Head CT: Atrophy  and chronic ischemic changes. No acute or traumatic finding.  Cervical spine CT: Chronic spondylosis and ankylosis. No acute or traumatic finding.   Electronically Signed   By: Nelson Chimes M.D.   On: 12/10/2014 21:57   Ct Cervical Spine Wo Contrast  12/10/2014   CLINICAL DATA:  Found on the floor after falling at home.  EXAM: CT HEAD WITHOUT CONTRAST  CT CERVICAL SPINE WITHOUT CONTRAST  TECHNIQUE: Multidetector CT imaging of the head and cervical spine was performed following the standard protocol without intravenous contrast. Multiplanar CT image reconstructions of the cervical spine were also generated.  COMPARISON:  07/14/2014  FINDINGS: CT HEAD FINDINGS  There is generalized brain atrophy. There is chronic small vessel disease throughout the deep white matter. 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 There is chronic temporal and occipital atrophy with prominence of the ventricles. No sign of acute infarction, mass lesion, hemorrhage, obstructive hydrocephalus or extra-axial collection. No skull fracture. No traumatic fluid in the sinuses, middle ears or mastoids. Chronic sclerosis in the right mastoid region.  CT CERVICAL SPINE FINDINGS  No cervical fracture. No traumatic malalignment. No soft tissue swelling.  There are prominent bridging osteophytes consistent with spondylosis 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 There is ordinary facet osteoarthritis.  IMPRESSION: Head CT: Atrophy and chronic ischemic changes. No acute or traumatic finding.  Cervical spine CT: Chronic spondylosis and ankylosis. No acute or traumatic finding.   Electronically Signed   By: Nelson Chimes M.D.   On: 12/10/2014 21:57   Dg Chest Port 1 View  12/10/2014   CLINICAL DATA:  Preop femur fracture repair.  EXAM: PORTABLE CHEST - 1 VIEW  COMPARISON:  07/14/2014  FINDINGS: Patient is post median sternotomy. Cardiomediastinal contours are unchanged with mild  cardiomegaly, heart size likely accentuated by technique. Lungs remain hyperinflated. No pulmonary edema, consolidation, pleural effusion or pneumothorax. The bones are diffusely under mineralized.  IMPRESSION: Stable chronic change without acute process.   Electronically Signed   By: Jeb Levering M.D.   On: 12/10/2014 23:45   Dg C-arm 61-120 Min  12/11/2014   CLINICAL DATA:  Hardware fixation of a comminuted right subtrochanteric femoral fracture.  EXAM: RIGHT FEMUR 2 VIEWS; DG C-ARM 61-120 MIN  COMPARISON:  Right femur radiographs obtained yesterday.  FINDINGS: Six C arm views of the right  femur demonstrate interval placement of an intramedullary rod and screws bridging the previously seen proximal right femur fracture. Improved position and alignment of the fragments with mild residual posterior displacement of the distal fragment.  IMPRESSION: Hardware fixation of the previously demonstrated proximal femur fracture.   Electronically Signed   By: Claudie Revering M.D.   On: 12/11/2014 15:07   Dg Femur, Min 2 Views Right  12/11/2014   CLINICAL DATA:  Hardware fixation of a comminuted right subtrochanteric femoral fracture.  EXAM: RIGHT FEMUR 2 VIEWS; DG C-ARM 61-120 MIN  COMPARISON:  Right femur radiographs obtained yesterday.  FINDINGS: Six C arm views of the right femur demonstrate interval placement of an intramedullary rod and screws bridging the previously seen proximal right femur fracture. Improved position and alignment of the fragments with mild residual posterior displacement of the distal fragment.  IMPRESSION: Hardware fixation of the previously demonstrated proximal femur fracture.   Electronically Signed   By: Claudie Revering M.D.   On: 12/11/2014 15:07   Dg Femur, Min 2 Views Right  12/10/2014   CLINICAL DATA:  [place] today.  Right leg pain.  EXAM: RIGHT FEMUR 2 VIEWS; PELVIS - 1-2 VIEW  COMPARISON:  None.  FINDINGS: There is a comminuted displaced subtrochanteric fracture of the femur. No  definite pelvic fractures. Both hips are normally located.  IMPRESSION: Displaced subtrochanteric fracture of the right femur.   Electronically Signed   By: Marijo Sanes M.D.   On: 12/10/2014 21:35    Oren Binet, MD  [facility] Pager:[phone]  If 7PM-7AM, please contact night-coverage [email or web address] Password [email or web address] 12/11/2014, 4:26 PM   LOS: 1 day

## 2014-12-12 DIAGNOSIS — S7290XA Unspecified fracture of unspecified femur, initial encounter for closed fracture: Secondary | ICD-10-CM

## 2014-12-12 DIAGNOSIS — R778 Other specified abnormalities of plasma proteins: Secondary | ICD-10-CM

## 2014-12-12 DIAGNOSIS — D62 Acute posthemorrhagic anemia: Secondary | ICD-10-CM | POA: Insufficient documentation

## 2014-12-12 DIAGNOSIS — R7989 Other specified abnormal findings of blood chemistry: Secondary | ICD-10-CM

## 2014-12-12 DIAGNOSIS — I482 Chronic atrial fibrillation, unspecified: Secondary | ICD-10-CM | POA: Insufficient documentation

## 2014-12-12 DIAGNOSIS — I5022 Chronic systolic (congestive) heart failure: Secondary | ICD-10-CM | POA: Insufficient documentation

## 2014-12-12 DIAGNOSIS — N179 Acute kidney failure, unspecified: Secondary | ICD-10-CM | POA: Insufficient documentation

## 2014-12-12 LAB — BASIC METABOLIC PANEL
ANION GAP: 14 (ref 5–15)
BUN: 64 mg/dL — ABNORMAL HIGH (ref 6–23)
CALCIUM: 7.8 mg/dL — AB (ref 8.4–10.5)
CHLORIDE: 110 mmol/L (ref 96–112)
CO2: 18 mmol/L — ABNORMAL LOW (ref 19–32)
CREATININE: 2.99 mg/dL — AB (ref 0.50–1.35)
GFR, EST AFRICAN AMERICAN: 19 mL/min — AB (ref >90)
GFR, EST NON AFRICAN AMERICAN: 17 mL/min — AB (ref >90)
GLUCOSE: 140 mg/dL — AB (ref 70–99)
Potassium: 4 mmol/L (ref 3.5–5.1)
Sodium: 142 mmol/L (ref 135–145)

## 2014-12-12 LAB — GLUCOSE, CAPILLARY: Glucose-Capillary: 119 mg/dL — ABNORMAL HIGH (ref 70–99)

## 2014-12-12 LAB — PROTIME-INR
INR: 1.57 — AB (ref 0.00–1.49)
PROTHROMBIN TIME: 18.9 s — AB (ref 11.6–15.2)

## 2014-12-12 MED ORDER — SENNOSIDES-DOCUSATE SODIUM 8.6-50 MG PO TABS
2.0000 | ORAL_TABLET | Freq: Every day | ORAL | Status: DC
  Administered 2014-12-12 – 2014-12-14 (×3): 2 via ORAL
  Filled 2014-12-12 (×3): qty 2

## 2014-12-12 MED ORDER — CARVEDILOL 3.125 MG PO TABS
3.1250 mg | ORAL_TABLET | Freq: Two times a day (BID) | ORAL | Status: DC
  Administered 2014-12-13 – 2014-12-15 (×5): 3.125 mg via ORAL
  Filled 2014-12-12 (×6): qty 1

## 2014-12-12 MED ORDER — WARFARIN SODIUM 5 MG PO TABS
5.0000 mg | ORAL_TABLET | Freq: Once | ORAL | Status: AC
  Administered 2014-12-12: 5 mg via ORAL
  Filled 2014-12-12: qty 1

## 2014-12-12 MED ORDER — CARVEDILOL 3.125 MG PO TABS: 3.1250 mg | ORAL_TABLET | Freq: Two times a day (BID) | ORAL | Status: DC

## 2014-12-12 MED ORDER — POLYETHYLENE GLYCOL 3350 17 G PO PACK
17.0000 g | PACK | Freq: Every day | ORAL | Status: DC
  Administered 2014-12-12 – 2014-12-15 (×3): 17 g via ORAL
  Filled 2014-12-12 (×3): qty 1

## 2014-12-12 MED ORDER — ACETAMINOPHEN 500 MG PO TABS
1000.0000 mg | ORAL_TABLET | Freq: Three times a day (TID) | ORAL | Status: DC
  Administered 2014-12-12 – 2014-12-15 (×9): 1000 mg via ORAL
  Filled 2014-12-12 (×9): qty 2

## 2014-12-12 MED ORDER — SODIUM CHLORIDE 0.9 % IV BOLUS (SEPSIS)
250.0000 mL | Freq: Once | INTRAVENOUS | Status: AC
  Administered 2014-12-12: 250 mL via INTRAVENOUS

## 2014-12-12 MED ORDER — TRAMADOL HCL 50 MG PO TABS: 25.0000 mg | ORAL_TABLET | Freq: Two times a day (BID) | ORAL | Status: DC | PRN

## 2014-12-12 NOTE — Progress Notes (Signed)
Subjective: 1 Day Post-Op Procedure(s) (LRB): INTRAMEDULLARY (IM) NAIL FEMORAL (Right) Patient sleeping family bedside.  Objective: Vital signs in last 24 hours: Temp:  [97.4 F (36.3 C)-99.4 F (37.4 C)] 99.4 F (37.4 C) (04/17 0545) Pulse Rate:  [77-110] 80 (04/17 0741) Resp:  [11-26] 16 (04/17 0545) BP: (80-147)/(30-89) 80/30 mmHg (04/17 0741) SpO2:  [94 %-100 %] 98 % (04/17 0741) Arterial Line BP: (143-171)/(57-68) 166/68 mmHg (04/16 1523) Weight:  [75.297 kg (166 lb)] 75.297 kg (166 lb) (04/17 0545)  Intake/Output from previous day: 04/16 0701 - 04/17 0700 In: 1820 [I.V.:900; Blood:670; IV Piggyback:250] Out: 2325 [Urine:1625; Blood:700] Intake/Output this shift:     Recent Labs  12/10/14 2034 12/11/14 0555 12/11/14 1401 12/12/14 0520  HGB 10.8* 9.7* 7.5* 8.7*    Recent Labs  12/11/14 0555 12/11/14 1401 12/12/14 0520  WBC 8.6  --  8.1  RBC 3.25*  --  2.93*  HCT 29.9* 22.0* 26.7*  PLT 116*  --  80*    Recent Labs  12/11/14 0555 12/11/14 1401 12/12/14 0520  NA 140 142 142  K 4.3 3.9 4.0  CL 105  --  110  CO2 19  --  18*  BUN 67*  --  64*  CREATININE 2.88*  --  2.99*  GLUCOSE 151* 148* 140*  CALCIUM 8.3*  --  7.8*    Recent Labs  12/11/14 0900 12/12/14 0520  INR 1.71* 1.57*   Right Hip: Intact pulses distally Incision: dressing C/D/I Compartment soft  Assessment/Plan: 1 Day Post-Op Procedure(s) (LRB): INTRAMEDULLARY (IM) NAIL FEMORAL (Right) Hypotension receiving bolus of fluid now Acute blood loss anemia secondary to surgery and fracture monitor closely CKD stage 4 near base line Right lower extremity partial weight bearing 50% CLARK, GILBERT 12/12/2014, 8:16 AM

## 2014-12-12 NOTE — Progress Notes (Signed)
[place] for coumadin Indication: VTE prophylaxis  Allergies  Allergen Reactions  . Ace Inhibitors     Cannot remember the reaction    Patient Measurements: Weight: 166 lb (75.297 kg) Heparin Dosing Weight:   Vital Signs: Temp: 99.4 F (37.4 C) (04/17 0545) Temp Source: Axillary (04/17 0545) BP: 94/50 mmHg (04/17 1042) Pulse Rate: 84 (04/17 0912)  Labs:  Recent Labs  12/10/14 2034 12/10/14 2235 12/11/14 0555 12/11/14 0900 12/11/14 1401 12/12/14 0520  HGB 10.8*  --  9.7*  --  7.5* 8.7*  HCT 32.8*  --  29.9*  --  22.0* 26.7*  PLT 123*  --  116*  --   --  80*  LABPROT 23.1*  --   --  20.2*  --  18.9*  INR 2.03*  --   --  1.71*  --  1.57*  CREATININE 2.83*  --  2.88*  --   --  2.99*  TROPONINI  --  0.03 0.07* 0.08*  --   --     Estimated Creatinine Clearance: 16.8 mL/min (by C-G formula based on Cr of 2.99).   Assessment: 81 yo who was admitted for a femur fx now s/p surgery. Pt has a hx of afib and has been on warfarin for anticoagulation.  He received vitamin k 10mg  IV x1 prior to surgery on 4/15.  Warfarin was resumed yesterday. INR today is 1.57.   PTA dosing was warfarin 3.75mg  daily except 2.5mg  on MTh  Goal of Therapy:  INR 2-3 Monitor platelets by anticoagulation protocol: Yes   Plan:  -warfarin 5mg  PO x1 -Daily INR -follow s/s bleeding and CBC  Desarai Barrack D. Konner Saiz, PharmD, BCPS Clinical Pharmacist Pager: [phone] 12/12/2014 11:43 AM

## 2014-12-12 NOTE — Progress Notes (Signed)
Clinical Social Work Department BRIEF PSYCHOSOCIAL ASSESSMENT 12/12/2014  Patient:  [name], [name]     Account Number:  [id number]     Admit date:  12/10/2014  Clinical Social Worker:  Pete Pelt, Preston  Date/Time:  12/12/2014 02:59 PM  Referred by:  Physician  Date Referred:  12/12/2014 Referred for  SNF Placement   Other Referral:   Interview type:  Family Other interview type:    PSYCHOSOCIAL DATA Living Status:  WIFE Admitted from facility:   Level of care:   Primary support name:  Suan Halter   [phone] Primary support relationship to patient:  CHILD, ADULT Degree of support available:   Daughter stated that the Pt lives less than 5 minutes away from her home and that she handles all of her Parents needs. Pt also has support from family, friends, and church.    CURRENT CONCERNS Current Concerns  Post-Acute Placement   Other Concerns:    SOCIAL WORK ASSESSMENT / PLAN CSW met with the daughter, Pt, and Pt's wife at the bedside. Pt was not able to participate in assessment. CSW introduced self and informed Pt's daughter Suan Halter the reason for consult. Daughter was aware of the referral and was expecting visit. The daughter stated that the Pt is from home and he lives there with his wife. Pt has a good support. Pt has never been to a SNF prior to this hospitalization. Pt and family have no preferrences at this time. CSW was given permission to send Pt information to [facility] for d/c planning. CSW will proceed with SNF search.   Assessment/plan status:  Information/Referral to [employer] Other assessment/ plan:   Information/referral to community resources:   CSW provided [employer].    PATIENT'S/FAMILY'S RESPONSE TO PLAN OF CARE: Pt's daughter was appreciative for assistance with d/c planning.  CSW to continue to follow for d/c planning.        [facility]  2S, 6M, 5N, 6N, [place],  PEDS/PICU [phone]

## 2014-12-12 NOTE — Progress Notes (Addendum)
PATIENT DETAILS Name: [name] Amos Sr. Age: 81 y.o. Sex: male Date of Birth: [birth year] Admit Date: 12/10/2014 Admitting Physician Jani Gravel, MD IEP:PIRJJO Ronnald Ramp, MD  Subjective: Sleepy/drowsy this morning-received 2 tablets of Vicodin early this morning. Hypotensive as well. Daughter and spouse at bedside-per them uneventful night  Assessment/Plan: Active Problems: Closed displaced spiral fracture of shaft of right femur:secondary to a mechanical fall. Seen by cards pre-operatively, then underwent repair of left hip on 4/16. Per op note, 500 cc of blood loss, subsequently transfused 2 units of PRBC. Given Lethargy and hypotension,I have discontinued narcotics, I will try and control pain first with Tylenol and use narcotics very sparingly. Patient's daughter and spouse are aware and agreeable with this plan. Will need SNF on discharge. On Coumadin for DVT prophylaxis (takes Coumadin for atrial fibrillation)  Transient Hypotension: occurred on 4/17 am-suspect secondary to Narcotics, and post operative blood loss. Resolved with IVF.  Will need to minimize narcotics.  Acute perioperative blood loss anemia: transfused 2 units of PRBC on 4/16, follow CBC.  Acute on chronic kidney disease stage IV: Secondary to acute illness, hypertension, blood loss. Continue to hold Lasix, cautiously hydrate and continue to follow. Given hypotension this morning, suspect creatinine may continue to increase.   Atrial Fibrillation:continue rate control with Coreg, Coumadin resumed, INR still subtherapeutic. Follow.   Chronic systolic CHF:EF of 84%-16% by Echo in 2012. Will need to be careful with IVF,clinically compensated at this time. Continue Coreg, not a candidate for ACEI/ARB given CKD stage 4. Resume Lasix when oral intake more stable.   SAY:TKZSWFU not on ASA as on coumadin. Minimal troponin elevated is likely secondary to CKD  Dyslipidemia:continue Fenofibrate  Hypothyroidism:continue  Synthroid  XNA:TFTDDUKG [place].  Disposition: Remain inpatient-SNF on discharge over the next 2-3 days  Antibiotics:  See below   Anti-infectives    Start     Dose/Rate Route Frequency Ordered Stop   12/12/14 0030  ceFAZolin (ANCEF) IVPB 2 g/50 mL premix     2 g 100 mL/hr over 30 Minutes Intravenous Every 12 hours 12/11/14 1613 12/12/14 0014      DVT Prophylaxis: coumadin  Code Status: Full code -family continues to  discuss code status and let us know  Family Communication Daughter/Spouse at bedside  Procedures:  4/16:Intramedullary nail of righ hip  CONSULTS:  cardiology and orthopedic surgery  Time spent 40 minutes-which includes 50% of the time with face-to-face with patient/ family and coordinating care related to the above assessment and plan.  MEDICATIONS: Scheduled Meds: . sodium chloride   Intravenous Once  . acetaminophen  1,000 mg Oral 3 times per day  . carvedilol  3.125 mg Oral BID WC  . Chlorhexidine Gluconate Cloth  6 each Topical Daily  . fenofibrate  54 mg Oral Daily  . levothyroxine  25 mcg Oral QAC breakfast  . mupirocin ointment  1 application Nasal BID  . polyethylene glycol  17 g Oral Daily  . senna-docusate  2 tablet Oral QHS  . sodium chloride  3 mL Intravenous Q12H  . sodium chloride  3 mL Intravenous Q12H  . tamsulosin  0.4 mg Oral QPC supper  . VASCULERA  1 tablet Oral Daily  . warfarin  5 mg Oral ONCE-1800  . Warfarin - Pharmacist Dosing Inpatient   Does not apply q1800   Continuous Infusions: . sodium chloride 75 mL/hr at 12/11/14 2340   PRN Meds:.sodium chloride, menthol-cetylpyridinium **OR** phenol, methocarbamol **OR** methocarbamol (ROBAXIN)  IV, metoCLOPramide **OR** metoCLOPramide (REGLAN) injection, nitroGLYCERIN, ondansetron **OR** ondansetron (ZOFRAN) IV, sodium chloride, traMADol    PHYSICAL EXAM: Vital signs in last 24 hours: Filed Vitals:   12/12/14 0545 12/12/14 0741 12/12/14 0912 12/12/14 1042  BP: 82/30  80/30 84/38 94/50   Pulse: 80 80 84   Temp: 99.4 F (37.4 C)     TempSrc: Axillary     Resp: 16     Weight: 75.297 kg (166 lb)     SpO2: 94% 98% 98%     Weight change: -0.272 kg (-9.6 oz) Filed Weights   12/11/14 0455 12/12/14 0545  Weight: 75.569 kg (166 lb 9.6 oz) 75.297 kg (166 lb)   Body mass index is 23.16 kg/(m^2).   Gen Exam: Awake, mildly confused/lethargic-but not in any distress Neck: Supple, No JVD.   Chest: B/L Clear.  No rales CVS: S1 S2 Regular, no murmurs.  Abdomen: soft, BS +, non tender, non distended.  Extremities: no edema, lower extremities warm to touch. Neurologic: Non Focal.   Skin: No Rash.   Wounds: N/A.   Intake/Output from previous day:  Intake/Output Summary (Last 24 hours) at 12/12/14 1231 Last data filed at 12/12/14 0548  Gross per 24 hour  Intake   1820 ml  Output   1825 ml  Net     -5 ml     LAB RESULTS: CBC  Recent Labs Lab 12/10/14 2034 12/11/14 0555 12/11/14 1401 12/12/14 0520  WBC 11.5* 8.6  --  8.1  HGB 10.8* 9.7* 7.5* 8.7*  HCT 32.8* 29.9* 22.0* 26.7*  PLT 123* 116*  --  80*  MCV 90.1 92.0  --  91.1  MCH 29.7 29.8  --  29.7  MCHC 32.9 32.4  --  32.6  RDW 14.8 14.8  --  15.0  LYMPHSABS 1.4 0.9  --   --   MONOABS 0.7 0.8  --   --   EOSABS 0.3 0.0  --   --   BASOSABS 0.0 0.0  --   --     Chemistries   Recent Labs Lab 12/10/14 2034 12/11/14 0555 12/11/14 1401 12/12/14 0520  NA 139 140 142 142  K 4.1 4.3 3.9 4.0  CL 106 105  --  110  CO2 20 19  --  18*  GLUCOSE 152* 151* 148* 140*  BUN 70* 67*  --  64*  CREATININE 2.83* 2.88*  --  2.99*  CALCIUM 8.6 8.3*  --  7.8*    CBG:  Recent Labs Lab 12/10/14 2030 12/12/14 0545  GLUCAP 146* 119*    GFR Estimated Creatinine Clearance: 16.8 mL/min (by C-G formula based on Cr of 2.99).  Coagulation profile  Recent Labs Lab 12/07/14 1602 12/10/14 2034 12/11/14 0900 12/12/14 0520  INR 1.8 2.03* 1.71* 1.57*    Cardiac Enzymes  Recent Labs Lab  12/10/14 2235 12/11/14 0555 12/11/14 0900  TROPONINI 0.03 0.07* 0.08*    Invalid input(s): POCBNP No results for input(s): DDIMER in the last 72 hours. No results for input(s): HGBA1C in the last 72 hours. No results for input(s): CHOL, HDL, LDLCALC, TRIG, CHOLHDL, LDLDIRECT in the last 72 hours. No results for input(s): TSH, T4TOTAL, T3FREE, THYROIDAB in the last 72 hours.  Invalid input(s): FREET3 No results for input(s): VITAMINB12, FOLATE, FERRITIN, TIBC, IRON, RETICCTPCT in the last 72 hours. No results for input(s): LIPASE, AMYLASE in the last 72 hours.  Urine Studies No results for input(s): UHGB, CRYS in the last 72 hours.  Invalid input(s): UACOL,  UAPR, USPG, UPH, UTP, UGL, UKET, UBIL, UNIT, UROB, ULEU, UEPI, UWBC, URBC, UBAC, CAST, UCOM, BILUA  MICROBIOLOGY: Recent Results (from the past 240 hour(s))  Surgical pcr screen     Status: Abnormal   Collection Time: 12/11/14  8:38 AM  Result Value Ref Range Status   MRSA, PCR NEGATIVE NEGATIVE Final   Staphylococcus aureus POSITIVE (A) NEGATIVE Final    Comment:        The Xpert SA Assay (FDA approved for NASAL specimens in patients over 40 years of age), is one component of a comprehensive surveillance program.  Test performance has been validated by [facility] for patients greater than or equal to 72 year old. It is not intended to diagnose infection nor to guide or monitor treatment.     RADIOLOGY STUDIES/RESULTS: Dg Pelvis 1-2 Views  12/10/2014   CLINICAL DATA:  [place] today.  Right leg pain.  EXAM: RIGHT FEMUR 2 VIEWS; PELVIS - 1-2 VIEW  COMPARISON:  None.  FINDINGS: There is a comminuted displaced subtrochanteric fracture of the femur. No definite pelvic fractures. Both hips are normally located.  IMPRESSION: Displaced subtrochanteric fracture of the right femur.   Electronically Signed   By: Marijo Sanes M.D.   On: 12/10/2014 21:35   Ct Head Wo Contrast  12/10/2014   CLINICAL DATA:  Found on the floor after  falling at home.  EXAM: CT HEAD WITHOUT CONTRAST  CT CERVICAL SPINE WITHOUT CONTRAST  TECHNIQUE: Multidetector CT imaging of the head and cervical spine was performed following the standard protocol without intravenous contrast. Multiplanar CT image reconstructions of the cervical spine were also generated.  COMPARISON:  07/14/2014  FINDINGS: CT HEAD FINDINGS  There is generalized brain atrophy. There is chronic small vessel disease throughout the deep white matter. 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 There is chronic temporal and occipital atrophy with prominence of the ventricles. No sign of acute infarction, mass lesion, hemorrhage, obstructive hydrocephalus or extra-axial collection. No skull fracture. No traumatic fluid in the sinuses, middle ears or mastoids. Chronic sclerosis in the right mastoid region.  CT CERVICAL SPINE FINDINGS  No cervical fracture. No traumatic malalignment. No soft tissue swelling.  There are prominent bridging osteophytes consistent with spondylosis 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 There is ordinary facet osteoarthritis.  IMPRESSION: Head CT: Atrophy and chronic ischemic changes. No acute or traumatic finding.  Cervical spine CT: Chronic spondylosis and ankylosis. No acute or traumatic finding.   Electronically Signed   By: Nelson Chimes M.D.   On: 12/10/2014 21:57   Ct Cervical Spine Wo Contrast  12/10/2014   CLINICAL DATA:  Found on the floor after falling at home.  EXAM: CT HEAD WITHOUT CONTRAST  CT CERVICAL SPINE WITHOUT CONTRAST  TECHNIQUE: Multidetector CT imaging of the head and cervical spine was performed following the standard protocol without intravenous contrast. Multiplanar CT image reconstructions of the cervical spine were also generated.  COMPARISON:  07/14/2014  FINDINGS: CT HEAD FINDINGS  There is generalized brain atrophy. There is chronic small vessel disease throughout the deep white  matter. 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 There is chronic temporal and occipital atrophy with prominence of the ventricles. No sign of acute infarction, mass lesion, hemorrhage, obstructive hydrocephalus or extra-axial collection. No skull fracture. No traumatic fluid in the sinuses, middle ears or mastoids. Chronic sclerosis in the right mastoid region.  CT CERVICAL SPINE FINDINGS  No  cervical fracture. No traumatic malalignment. No soft tissue swelling.  There are prominent bridging osteophytes consistent with spondylosis 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 There is ordinary facet osteoarthritis.  IMPRESSION: Head CT: Atrophy and chronic ischemic changes. No acute or traumatic finding.  Cervical spine CT: Chronic spondylosis and ankylosis. No acute or traumatic finding.   Electronically Signed   By: Nelson Chimes M.D.   On: 12/10/2014 21:57   Dg Chest Port 1 View  12/10/2014   CLINICAL DATA:  Preop femur fracture repair.  EXAM: PORTABLE CHEST - 1 VIEW  COMPARISON:  07/14/2014  FINDINGS: Patient is post median sternotomy. Cardiomediastinal contours are unchanged with mild cardiomegaly, heart size likely accentuated by technique. Lungs remain hyperinflated. No pulmonary edema, consolidation, pleural effusion or pneumothorax. The bones are diffusely under mineralized.  IMPRESSION: Stable chronic change without acute process.   Electronically Signed   By: Jeb Levering M.D.   On: 12/10/2014 23:45   Dg C-arm 61-120 Min  12/11/2014   CLINICAL DATA:  Hardware fixation of a comminuted right subtrochanteric femoral fracture.  EXAM: RIGHT FEMUR 2 VIEWS; DG C-ARM 61-120 MIN  COMPARISON:  Right femur radiographs obtained yesterday.  FINDINGS: Six C arm views of the right femur demonstrate interval placement of an intramedullary rod and screws bridging the previously seen proximal right femur fracture. Improved position and alignment of the  fragments with mild residual posterior displacement of the distal fragment.  IMPRESSION: Hardware fixation of the previously demonstrated proximal femur fracture.   Electronically Signed   By: Claudie Revering M.D.   On: 12/11/2014 15:07   Dg Femur, Min 2 Views Right  12/11/2014   CLINICAL DATA:  Hardware fixation of a comminuted right subtrochanteric femoral fracture.  EXAM: RIGHT FEMUR 2 VIEWS; DG C-ARM 61-120 MIN  COMPARISON:  Right femur radiographs obtained yesterday.  FINDINGS: Six C arm views of the right femur demonstrate interval placement of an intramedullary rod and screws bridging the previously seen proximal right femur fracture. Improved position and alignment of the fragments with mild residual posterior displacement of the distal fragment.  IMPRESSION: Hardware fixation of the previously demonstrated proximal femur fracture.   Electronically Signed   By: Claudie Revering M.D.   On: 12/11/2014 15:07   Dg Femur, Min 2 Views Right  12/10/2014   CLINICAL DATA:  [place] today.  Right leg pain.  EXAM: RIGHT FEMUR 2 VIEWS; PELVIS - 1-2 VIEW  COMPARISON:  None.  FINDINGS: There is a comminuted displaced subtrochanteric fracture of the femur. No definite pelvic fractures. Both hips are normally located.  IMPRESSION: Displaced subtrochanteric fracture of the right femur.   Electronically Signed   By: Marijo Sanes M.D.   On: 12/10/2014 21:35    Oren Binet, MD  [facility] Pager:[phone]  If 7PM-7AM, please contact night-coverage [email or web address] Password [email or web address] 12/12/2014, 12:31 PM   LOS: 2 days

## 2014-12-12 NOTE — Progress Notes (Addendum)
Clinical Social Work Department CLINICAL SOCIAL WORK PLACEMENT NOTE 12/12/2014  Patient:  [name], [name]  Account Number:  [id number] Admit date:  12/10/2014  Clinical Social Worker:  Pete Pelt, CLINICAL SOCIAL WORKER  Date/time:  12/12/2014 03:49 PM  Clinical Social Work is seeking post-discharge placement for this patient at the following level of care:   SKILLED NURSING   (*CSW will update this form in Epic as items are completed)   12/12/2014  Patient/family provided with [place] Department of Clinical Social Work's list of facilities offering this level of care within the geographic area requested by the patient (or if unable, by the patient's family).  12/12/2014  Patient/family informed of their freedom to choose among providers that offer the needed level of care, that participate in Medicare, Medicaid or managed care program needed by the patient, have an available bed and are willing to accept the patient.  12/12/2014  Patient/family informed of [facility]' ownership interest in [facility], as well as of the fact that they are under no obligation to receive care at this facility.  PASARR submitted to EDS on 12/12/2014 PASARR number received on 12/12/2014  FL2 transmitted to all facilities in geographic area requested by pt/family on  12/12/2014 FL2 transmitted to all facilities within larger geographic area on   Patient informed that his/her managed care company has contracts with or will negotiate with  certain facilities, including the following:     Patient/family informed of bed offers received:  12/13/2014 Lubertha Sayres, Latanya Presser) Patient chooses bed at [facility] Lubertha Sayres, Latanya Presser) Physician recommends and patient chooses bed at    Patient to be transferred to  [facility] on  12/15/2014 Lubertha Sayres, [facility] [place]) Patient to be transferred to facility by  [facility] Lubertha Sayres, [place]) Patient and family notified of  transfer on 12/15/2014 Lubertha Sayres, Latanya Presser) Name of family member notified:  Suan Halter, patient's daughter [place])  The following physician request were entered in Epic:   Additional Comments:    [place]  [facility]  2S, [address], [place], 6N, 6E, PEDS/PICU [phone]  [place] Cell: [phone]       Fax: [phone] Clinical Social Work: Orthopedics [phone]) and Surgical [phone])

## 2014-12-12 NOTE — Evaluation (Signed)
Physical Therapy Evaluation Patient Details Name: [name] LONGTON Sr. MRN: [id number] DOB: 1919-12-27 Today's Date: 12/12/2014   History of Present Illness  81 yo male with hx of CAD , CKD stage 4, CM (EF 35-40%), Anemia, hypothyroidism, CVA, apparently c/o fall, unwittnessed. Pt doesn't think that there was syncope but can't rule it out. Pt was brought to ED by family and found to have right femur fracture. He underwent IM nailing 12-11-14.  Clinical Impression  Pt admitted with above diagnosis. Pt currently with functional limitations due to the deficits listed below (see PT Problem List). Pt agitated during eval, suspect as a pain response to mobility. Eval limited to sitting EOB. +2 Max assist required for bed mobility and max assist to maintain sitting balance. Pt's daughter reports, as baseline pt is without confusion. Pt will benefit from skilled PT to increase their independence and safety with mobility to allow discharge to the venue listed below.       Follow Up Recommendations SNF;Supervision/Assistance - 24 hour    Equipment Recommendations  Rolling walker with 5" wheels    Recommendations for Other Services       Precautions / Restrictions Precautions Precautions: Fall;Other (comment) Precaution Comments: low BP (80/30) Restrictions Weight Bearing Restrictions: Yes RLE Weight Bearing: Partial weight bearing RLE Partial Weight Bearing Percentage or Pounds: 50%      Mobility  Bed Mobility Overal bed mobility: Needs Assistance Bed Mobility: Supine to Sit;Sit to Supine     Supine to sit: Max assist;+2 for physical assistance Sit to supine: Max assist;+2 for physical assistance   General bed mobility comments: Pt unable to follow commands or assist with mobility, suspect due to medication/anesthesia.   Transfers                    Ambulation/Gait                Stairs            Wheelchair Mobility    Modified Rankin (Stroke Patients  Only)       Balance Overall balance assessment: Needs assistance Sitting-balance support: Bilateral upper extremity supported;Feet supported Sitting balance-Leahy Scale: Zero Sitting balance - Comments: Pt required max assist to maintain sitting EOB x 5 minutes. Pushing into extension. Postural control: Posterior lean                                   Pertinent Vitals/Pain Pain Assessment: Faces Faces Pain Scale: Hurts whole lot Pain Location: LLE with mobiltiy Pain Descriptors / Indicators: Guarding;Grimacing Pain Intervention(s): Limited activity within patient's tolerance;Monitored during session    [place] expects to be discharged to:: Private residence Living Arrangements: Spouse/significant other Available Help at Discharge: Family;Available 24 hours/day Type of Home: House Home Access: Ramped entrance     Home Layout: One level Home Equipment: Cane - single point;Walker - 4 wheels      Prior Function Level of Independence: Independent with assistive device(s)         Comments: uses cane or RW     Hand Dominance        Extremity/Trunk Assessment   Upper Extremity Assessment: Difficult to assess due to impaired cognition           Lower Extremity Assessment: Difficult to assess due to impaired cognition         Communication   Communication: HOH  Cognition Arousal/Alertness: Lethargic;Suspect due  to medications Behavior During Therapy: Agitated Overall Cognitive Status: Impaired/Different from baseline Area of Impairment: Orientation;Attention;Following commands;Safety/judgement;Awareness;Problem solving Orientation Level: Disoriented to;Place;Time;Situation Current Attention Level: Focused Memory: Decreased short-term memory   Safety/Judgement: Decreased awareness of safety;Decreased awareness of deficits Awareness: Intellectual Problem Solving: Slow processing;Difficulty sequencing;Decreased  initiation General Comments: Pt agitated and combative during Rx, suspected as a pain response    General Comments      Exercises        Assessment/Plan    PT Assessment Patient needs continued PT services  PT Diagnosis Difficulty walking;Generalized weakness;Acute pain;Altered mental status   PT Problem List Decreased strength;Decreased activity tolerance;Decreased balance;Decreased mobility;Decreased knowledge of precautions;Decreased safety awareness;Decreased cognition;Pain  PT Treatment Interventions DME instruction;Gait training;Functional mobility training;Therapeutic activities;Therapeutic exercise;Patient/family education;Cognitive remediation;Neuromuscular re-education;Balance training   PT Goals (Current goals can be found in the Care Plan section) Acute Rehab PT Goals Patient Stated Goal: not stated PT Goal Formulation: With family Time For Goal Achievement: 12/26/14 Potential to Achieve Goals: Fair    Frequency Min 3X/week   Barriers to discharge        Co-evaluation               End of Session   Activity Tolerance: Treatment limited secondary to agitation;Patient limited by pain Patient left: in bed;with family/visitor present;with call bell/phone within reach;with bed alarm set Nurse Communication: Mobility status         Time: 1505-6979 PT Time Calculation (min) (ACUTE ONLY): 15 min   Charges:   PT Evaluation $Initial PT Evaluation Tier I: 1 Procedure     PT G CodesLorriane Shire 12/12/2014, 9:50 AM

## 2014-12-12 NOTE — Progress Notes (Signed)
    Subjective:  Denies CP or dyspnea; difficulty swallowing pills. Mildly confused.   Objective:  Filed Vitals:   12/12/14 0545 12/12/14 0741 12/12/14 0912 12/12/14 1042  BP: 82/30 80/30 84/38  94/50  Pulse: 80 80 84   Temp: 99.4 F (37.4 C)     TempSrc: Axillary     Resp: 16     Weight: 166 lb (75.297 kg)     SpO2: 94% 98% 98%     Intake/Output from previous day:  Intake/Output Summary (Last 24 hours) at 12/12/14 1048 Last data filed at 12/12/14 0548  Gross per 24 hour  Intake   1820 ml  Output   1825 ml  Net     -5 ml    Physical Exam: Physical exam: Well-developed elderly in no acute distress.  Skin is warm and dry.  HEENT is normal.  Neck is supple.  Chest is clear to auscultation with normal expansion.  Cardiovascular exam is irregular Abdominal exam nontender or distended. No masses palpated. Extremities s/p repair of hip fx neuro grossly intact    Lab Results: Basic Metabolic Panel:  Recent Labs  12/11/14 0555 12/11/14 1401 12/12/14 0520  NA 140 142 142  K 4.3 3.9 4.0  CL 105  --  110  CO2 19  --  18*  GLUCOSE 151* 148* 140*  BUN 67*  --  64*  CREATININE 2.88*  --  2.99*  CALCIUM 8.3*  --  7.8*   CBC:  Recent Labs  12/10/14 2034 12/11/14 0555 12/11/14 1401 12/12/14 0520  WBC 11.5* 8.6  --  8.1  NEUTROABS 9.1* 7.0  --   --   HGB 10.8* 9.7* 7.5* 8.7*  HCT 32.8* 29.9* 22.0* 26.7*  MCV 90.1 92.0  --  91.1  PLT 123* 116*  --  80*   Cardiac Enzymes:  Recent Labs  12/10/14 2235 12/11/14 0555 12/11/14 0900  TROPONINI 0.03 0.07* 0.08*     Assessment/Plan:  1 elevated troponin-minimal elevation with no chest pain. No plans for further ischemia evaluation. 2 atrial fibrillation-continue low-dose carvedilol as tolerated by blood pressure. Continue Coumadin. 3 ischemic cardiomyopathy-continue low-dose beta blocker as tolerated by blood pressure. His blood pressure will not tolerate an ACE inhibitor or hydralazine nitrates. He is being  hydrated. Would be careful as he is at high risk for volume excess given reduced LV function. Would most likely discontinue IV fluids tomorrow and resume low-dose diuretics. 4 status post repair of hip fracture-management per orthopedics. 5 chronic stage IV renal failure-follow renal function in-house.  [name] 12/12/2014, 10:48 AM

## 2014-12-13 ENCOUNTER — Encounter (HOSPITAL_COMMUNITY): Payer: Self-pay | Admitting: Orthopaedic Surgery

## 2014-12-13 DIAGNOSIS — D62 Acute posthemorrhagic anemia: Secondary | ICD-10-CM

## 2014-12-13 DIAGNOSIS — E039 Hypothyroidism, unspecified: Secondary | ICD-10-CM

## 2014-12-13 DIAGNOSIS — N184 Chronic kidney disease, stage 4 (severe): Secondary | ICD-10-CM

## 2014-12-13 LAB — GLUCOSE, CAPILLARY: GLUCOSE-CAPILLARY: 132 mg/dL — AB (ref 70–99)

## 2014-12-13 LAB — CBC
HCT: 26.7 % — ABNORMAL LOW (ref 39.0–52.0)
HCT: 24 % — ABNORMAL LOW (ref 39.0–52.0)
HEMOGLOBIN: 8.7 g/dL — AB (ref 13.0–17.0)
Hemoglobin: 7.8 g/dL — ABNORMAL LOW (ref 13.0–17.0)
MCH: 29.7 pg (ref 26.0–34.0)
MCH: 29.8 pg (ref 26.0–34.0)
MCHC: 32.6 g/dL (ref 30.0–36.0)
MCHC: 32.5 g/dL (ref 30.0–36.0)
MCV: 91.1 fL (ref 78.0–100.0)
MCV: 91.6 fL (ref 78.0–100.0)
PLATELETS: 68 10*3/uL — AB (ref 150–400)
Platelets: 80 10*3/uL — ABNORMAL LOW (ref 150–400)
RBC: 2.93 MIL/uL — ABNORMAL LOW (ref 4.22–5.81)
RBC: 2.62 MIL/uL — AB (ref 4.22–5.81)
RDW: 15 % (ref 11.5–15.5)
RDW: 15.2 % (ref 11.5–15.5)
WBC: 8.1 10*3/uL (ref 4.0–10.5)
WBC: 7.2 10*3/uL (ref 4.0–10.5)

## 2014-12-13 LAB — PROTIME-INR
INR: 1.63 — ABNORMAL HIGH (ref 0.00–1.49)
Prothrombin Time: 19.5 seconds — ABNORMAL HIGH (ref 11.6–15.2)

## 2014-12-13 LAB — BASIC METABOLIC PANEL
Anion gap: 10 (ref 5–15)
BUN: 66 mg/dL — AB (ref 6–23)
CALCIUM: 8 mg/dL — AB (ref 8.4–10.5)
CHLORIDE: 112 mmol/L (ref 96–112)
CO2: 21 mmol/L (ref 19–32)
Creatinine, Ser: 3.07 mg/dL — ABNORMAL HIGH (ref 0.50–1.35)
GFR calc Af Amer: 19 mL/min — ABNORMAL LOW (ref >90)
GFR, EST NON AFRICAN AMERICAN: 16 mL/min — AB (ref >90)
Glucose, Bld: 141 mg/dL — ABNORMAL HIGH (ref 70–99)
POTASSIUM: 3.9 mmol/L (ref 3.5–5.1)
Sodium: 143 mmol/L (ref 135–145)

## 2014-12-13 LAB — PREPARE RBC (CROSSMATCH)

## 2014-12-13 MED ORDER — SODIUM CHLORIDE 0.9 % IV SOLN
Freq: Once | INTRAVENOUS | Status: AC
  Administered 2014-12-13: 11:00:00 via INTRAVENOUS

## 2014-12-13 MED ORDER — WARFARIN SODIUM 5 MG PO TABS
5.0000 mg | ORAL_TABLET | Freq: Once | ORAL | Status: AC
  Administered 2014-12-13: 5 mg via ORAL
  Filled 2014-12-13: qty 1

## 2014-12-13 MED ORDER — HYDROCODONE-ACETAMINOPHEN 5-325 MG PO TABS: 1.0000 | ORAL_TABLET | Freq: Four times a day (QID) | ORAL | Status: DC | PRN

## 2014-12-13 MED ORDER — ACETAMINOPHEN 325 MG PO TABS
650.0000 mg | ORAL_TABLET | Freq: Once | ORAL | Status: DC
  Filled 2014-12-13: qty 2

## 2014-12-13 MED ORDER — DIPHENHYDRAMINE HCL 50 MG/ML IJ SOLN
25.0000 mg | Freq: Once | INTRAMUSCULAR | Status: AC
  Administered 2014-12-13: 25 mg via INTRAVENOUS
  Filled 2014-12-13: qty 1

## 2014-12-13 MED ORDER — MIDODRINE HCL 2.5 MG PO TABS
2.5000 mg | ORAL_TABLET | Freq: Two times a day (BID) | ORAL | Status: DC
  Administered 2014-12-13: 2.5 mg via ORAL
  Filled 2014-12-13 (×4): qty 1

## 2014-12-13 MED ORDER — FUROSEMIDE 10 MG/ML IJ SOLN
40.0000 mg | Freq: Once | INTRAMUSCULAR | Status: AC
  Administered 2014-12-13: 40 mg via INTRAVENOUS
  Filled 2014-12-13: qty 4

## 2014-12-13 NOTE — Discharge Instructions (Signed)
50% weight only right leg. Ice as needed for swelling. Can get incisions wet. New dry dressing changed as needed.

## 2014-12-13 NOTE — Progress Notes (Signed)
Awaiting swallow eval for patient due to coughing and choking after intake of fluids and attempt to swallow pills. Daughter at bedside and does not want patient to take any medications that are not pertinent at this time until evaluation completed.

## 2014-12-13 NOTE — Progress Notes (Signed)
Utilization review completed.  

## 2014-12-13 NOTE — Progress Notes (Signed)
Subjective: 2 Days Post-Op Procedure(s) (LRB): INTRAMEDULLARY (IM) NAIL FEMORAL (Right) Patient reports pain as moderate.  Acute blood loss anemia from fracture and surgery.  Will likely need a transfusion given his creatinine, but will defer to the primary team.  Objective: Vital signs in last 24 hours: Temp:  [97.5 F (36.4 C)-98.5 F (36.9 C)] 98.2 F (36.8 C) (04/18 0500) Pulse Rate:  [73-84] 83 (04/18 0500) Resp:  [16-18] 16 (04/18 0500) BP: (80-110)/(30-90) 91/34 mmHg (04/18 0500) SpO2:  [94 %-98 %] 96 % (04/18 0500) Weight:  [76.2 kg (167 lb 15.9 oz)] 76.2 kg (167 lb 15.9 oz) (04/18 0500)  Intake/Output from previous day: 04/17 0701 - 04/18 0700 In: 1685 [P.O.:360; I.V.:825; IV Piggyback:500] Out: 500 [Urine:500] Intake/Output this shift:     Recent Labs  12/10/14 2034 12/11/14 0555 12/11/14 1401 12/12/14 0520 12/13/14 0539  HGB 10.8* 9.7* 7.5* 8.7* 7.8*    Recent Labs  12/12/14 0520 12/13/14 0539  WBC 8.1 7.2  RBC 2.93* 2.62*  HCT 26.7* 24.0*  PLT 80* 68*    Recent Labs  12/12/14 0520 12/13/14 0539  NA 142 143  K 4.0 3.9  CL 110 112  CO2 18* 21  BUN 64* 66*  CREATININE 2.99* 3.07*  GLUCOSE 140* 141*  CALCIUM 7.8* 8.0*    Recent Labs  12/12/14 0520 12/13/14 0539  INR 1.57* 1.63*    Intact pulses distally Dorsiflexion/Plantar flexion intact Incision: dressing C/D/I No cellulitis present Compartment soft  Assessment/Plan: 2 Days Post-Op Procedure(s) (LRB): INTRAMEDULLARY (IM) NAIL FEMORAL (Right) Up with therapy Discharge to SNF soon. 50% weight only right leg.  Kenneith Stief Y 12/13/2014, 7:17 AM

## 2014-12-13 NOTE — Progress Notes (Signed)
PT Cancellation Note  Patient Details Name: [name]. MRN: [id number] DOB: 05-19-1920   Cancelled Treatment:    Reason Eval/Treat Not Completed: Medical issues which prohibited therapy. Patient with multiple medicals issues today that would limit participation in therapy. Patient to receive blood later this AM. Also with decreased BP and lethargy. Will attempt later this afternoon if patient is appropriate.    Jacqualyn Posey 12/13/2014, 9:28 AM

## 2014-12-13 NOTE — Care Management Note (Signed)
CARE MANAGEMENT NOTE 12/13/2014  Patient:  [name], [name]   Account Number:  [id number]  Date Initiated:  12/13/2014  Documentation initiated by:  Ricki Miller  Subjective/Objective Assessment:   81 yr old male admitted with a right femur fracture s/p fall. patient underwent a right hip IM Nailing.     Action/Plan:   Patient will need shortterm rehab at [facility]. Social worker is aware.   Anticipated DC Date:  12/14/2014   Anticipated DC Plan:  SKILLED NURSING FACILITY  In-house referral  Clinical Social Worker      DC Planning Services  CM consult      Choice offered to / List presented to:     DME arranged  NA        Heath arranged  NA      Status of service:  Completed, signed off Medicare Important Message given?  YES (If response is "NO", the following Medicare IM given date fields will be blank) Date Medicare IM given:  12/13/2014 Medicare IM given by:   Date Additional Medicare IM given:   Additional Medicare IM given by:    Discharge Disposition:  [place]  Per UR Regulation:  Reviewed for med. necessity/level of care/duration of stay  If discussed at [place] of Stay Meetings, dates discussed:    Comments:

## 2014-12-13 NOTE — Progress Notes (Addendum)
PATIENT DETAILS Name: [name] Amos Sr. Age: 81 y.o. Sex: male Date of Birth: 02/05/1920 Admit Date: 12/10/2014 Admitting Physician Jani Gravel, MD [id number] Ronnald Ramp, MD  Subjective: Much more awake and alert. Claims pain is under control.Claims some mild SOB. Daughter and spouse at bedside.  Assessment/Plan: Active Problems: Closed displaced spiral fracture of shaft of right femur:secondary to a mechanical fall. Seen by cards pre-operatively, then underwent repair of left hip on 4/16. Per op note, 500 cc of blood loss, subsequently transfused 2 units of PRBC. Unfortunately, post operative course has been complicated by development of perioperative blood loss anemia, transient hypotension and worsening renal function. Orthopedics continues to follow, blood pressure much better.continue Coumadin for DVT prophylaxis, continue Tylenol for pain, continue to use narcotics as sparingly as possible. Will require SNF on discharge.  Transient Hypotension: occurred on 4/17 am-suspect secondary to Narcotics, and post operative blood loss. Much better, blood pressure continues to be soft. Try low dose Midodrine.  Continue to minimize narcotics.  Acute perioperative blood loss anemia: transfused 2 units of PRBC on 4/16, hemoglobin is still less than 8-given history of ischemic heart disease, ongoing shortness of breath, we'll transfuse 1 more unit PRBC today.  Acute on chronic kidney disease stage IV: Secondary to acute illness, hypotension, blood loss. Unfortunately creatinine continues to increase, will stop IV fluids as he appears euvolemic. Transfuse 1 unit of PRBC, and then we'll give 1 dose of IV Lasix following that. Have asked RN to continue to monitor intake/output closely. Daily weights.  Atrial Fibrillation:continue rate control with Coreg (Holding parameters in place), Coumadin resumed, INR still subtherapeutic. Follow.   Chronic systolic CHF:EF of 58%-52% by Echo in 2012. Has some few  bibasilar rales, but still compensated. Stop IV fluids, 1 dose of Lasix post PRBC transfusion. Cautiously continue with Coreg (blood pressure permitting), not a candidate for ACEI/ARB given CKD stage 4.   Thrombocytopenia:chronic issue,slightly worse than baseline. Follow  DPO:EUMPNTI not on ASA as on coumadin. Minimal troponin elevated is likely secondary to CKD  Dyslipidemia:continue Fenofibrate  Hypothyroidism:continue Synthroid  RWE:[id number] Rapaflo.  Disposition: Remain inpatient-SNF on discharge over the next 2-3 days  Antibiotics:  See below   Anti-infectives    Start     Dose/Rate Route Frequency Ordered Stop   12/12/14 0030  ceFAZolin (ANCEF) IVPB 2 g/50 mL premix     2 g 100 mL/hr over 30 Minutes Intravenous Every 12 hours 12/11/14 1613 12/12/14 0014      DVT Prophylaxis: coumadin  Code Status: DO NOT RESUSCITATE-discussed with family at bedside.   Family Communication Daughter/Spouse at bedside  Procedures:  4/16:Intramedullary nail of righ hip  CONSULTS:  cardiology and orthopedic surgery  MEDICATIONS: Scheduled Meds: . sodium chloride   Intravenous Once  . sodium chloride   Intravenous Once  . acetaminophen  1,000 mg Oral 3 times per day  . acetaminophen  650 mg Oral Once  . carvedilol  3.125 mg Oral BID WC  . Chlorhexidine Gluconate Cloth  6 each Topical Daily  . diphenhydrAMINE  25 mg Intravenous Once  . fenofibrate  54 mg Oral Daily  . furosemide  40 mg Intravenous Once  . levothyroxine  25 mcg Oral QAC breakfast  . midodrine  2.5 mg Oral BID WC  . mupirocin ointment  1 application Nasal BID  . polyethylene glycol  17 g Oral Daily  . senna-docusate  2 tablet Oral QHS  . sodium chloride  3 mL Intravenous  Q12H  . sodium chloride  3 mL Intravenous Q12H  . tamsulosin  0.4 mg Oral QPC supper  . VASCULERA  1 tablet Oral Daily  . Warfarin - Pharmacist Dosing Inpatient   Does not apply q1800   Continuous Infusions:   PRN Meds:.sodium  chloride, menthol-cetylpyridinium **OR** phenol, methocarbamol **OR** methocarbamol (ROBAXIN)  IV, metoCLOPramide **OR** metoCLOPramide (REGLAN) injection, nitroGLYCERIN, ondansetron **OR** ondansetron (ZOFRAN) IV, sodium chloride, traMADol    PHYSICAL EXAM: Vital signs in last 24 hours: Filed Vitals:   12/12/14 2130 12/13/14 0000 12/13/14 0400 12/13/14 0500  BP: 98/35   91/34  Pulse: 84   83  Temp: 98.5 F (36.9 C)   98.2 F (36.8 C)  TempSrc: Oral   Oral  Resp: 16 16 16 16   Weight:    76.2 kg (167 lb 15.9 oz)  SpO2: 94% 98% 96% 96%    Weight change: 0.903 kg (1 lb 15.9 oz) Filed Weights   12/11/14 0455 12/12/14 0545 12/13/14 0500  Weight: 75.569 kg (166 lb 9.6 oz) 75.297 kg (166 lb) 76.2 kg (167 lb 15.9 oz)   Body mass index is 23.44 kg/(m^2).   Gen Exam: Awake, much more awake and alert. Not in any distress. Mildly confused. Neck: Supple, No JVD.   Chest: B/L Clear. Bibasilar rales CVS: S1 S2 Regular, no murmurs.  Abdomen: soft, BS +, non tender, non distended.  Extremities: no edema, lower extremities warm to touch. Neurologic: Non Focal.   Skin: No Rash.   Wounds: N/A.   Intake/Output from previous day:  Intake/Output Summary (Last 24 hours) at 12/13/14 1015 Last data filed at 12/13/14 0900  Gross per 24 hour  Intake 1749.17 ml  Output    500 ml  Net 1249.17 ml     LAB RESULTS: CBC  Recent Labs Lab 12/10/14 2034 12/11/14 0555 12/11/14 1401 12/12/14 0520 12/13/14 0539  WBC 11.5* 8.6  --  8.1 7.2  HGB 10.8* 9.7* 7.5* 8.7* 7.8*  HCT 32.8* 29.9* 22.0* 26.7* 24.0*  PLT 123* 116*  --  80* 68*  MCV 90.1 92.0  --  91.1 91.6  MCH 29.7 29.8  --  29.7 29.8  MCHC 32.9 32.4  --  32.6 32.5  RDW 14.8 14.8  --  15.0 15.2  LYMPHSABS 1.4 0.9  --   --   --   MONOABS 0.7 0.8  --   --   --   EOSABS 0.3 0.0  --   --   --   BASOSABS 0.0 0.0  --   --   --     Chemistries   Recent Labs Lab 12/10/14 2034 12/11/14 0555 12/11/14 1401 12/12/14 0520 12/13/14 0539   NA 139 140 142 142 143  K 4.1 4.3 3.9 4.0 3.9  CL 106 105  --  110 112  CO2 20 19  --  18* 21  GLUCOSE 152* 151* 148* 140* 141*  BUN 70* 67*  --  64* 66*  CREATININE 2.83* 2.88*  --  2.99* 3.07*  CALCIUM 8.6 8.3*  --  7.8* 8.0*    CBG:  Recent Labs Lab 12/10/14 2030 12/12/14 0545 12/13/14 0558  GLUCAP 146* 119* 132*    GFR Estimated Creatinine Clearance: 16.4 mL/min (by C-G formula based on Cr of 3.07).  Coagulation profile  Recent Labs Lab 12/07/14 1602 12/10/14 2034 12/11/14 0900 12/12/14 0520 12/13/14 0539  INR 1.8 2.03* 1.71* 1.57* 1.63*    Cardiac Enzymes  Recent Labs Lab 12/10/14 2235 12/11/14 0555  12/11/14 0900  TROPONINI 0.03 0.07* 0.08*    Invalid input(s): POCBNP No results for input(s): DDIMER in the last 72 hours. No results for input(s): HGBA1C in the last 72 hours. No results for input(s): CHOL, HDL, LDLCALC, TRIG, CHOLHDL, LDLDIRECT in the last 72 hours. No results for input(s): TSH, T4TOTAL, T3FREE, THYROIDAB in the last 72 hours.  Invalid input(s): FREET3 No results for input(s): VITAMINB12, FOLATE, FERRITIN, TIBC, IRON, RETICCTPCT in the last 72 hours. No results for input(s): LIPASE, AMYLASE in the last 72 hours.  Urine Studies No results for input(s): UHGB, CRYS in the last 72 hours.  Invalid input(s): UACOL, UAPR, USPG, UPH, UTP, UGL, UKET, UBIL, UNIT, UROB, ULEU, UEPI, UWBC, URBC, UBAC, CAST, UCOM, BILUA  MICROBIOLOGY: Recent Results (from the past 240 hour(s))  Surgical pcr screen     Status: Abnormal   Collection Time: 12/11/14  8:38 AM  Result Value Ref Range Status   MRSA, PCR NEGATIVE NEGATIVE Final   Staphylococcus aureus POSITIVE (A) NEGATIVE Final    Comment:        The Xpert SA Assay (FDA approved for NASAL specimens in patients over 31 years of age), is one component of a comprehensive surveillance program.  Test performance has been validated by [facility] for patients greater than or equal to 41 year  old. It is not intended to diagnose infection nor to guide or monitor treatment.     RADIOLOGY STUDIES/RESULTS: Dg Pelvis 1-2 Views  12/10/2014   CLINICAL DATA:  [place] today.  Right leg pain.  EXAM: RIGHT FEMUR 2 VIEWS; PELVIS - 1-2 VIEW  COMPARISON:  None.  FINDINGS: There is a comminuted displaced subtrochanteric fracture of the femur. No definite pelvic fractures. Both hips are normally located.  IMPRESSION: Displaced subtrochanteric fracture of the right femur.   Electronically Signed   By: Marijo Sanes M.D.   On: 12/10/2014 21:35   Ct Head Wo Contrast  12/10/2014   CLINICAL DATA:  Found on the floor after falling at home.  EXAM: CT HEAD WITHOUT CONTRAST  CT CERVICAL SPINE WITHOUT CONTRAST  TECHNIQUE: Multidetector CT imaging of the head and cervical spine was performed following the standard protocol without intravenous contrast. Multiplanar CT image reconstructions of the cervical spine were also generated.  COMPARISON:  07/14/2014  FINDINGS: CT HEAD FINDINGS  There is generalized brain atrophy. There is chronic small vessel disease throughout the deep white matter. 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 There is chronic temporal and occipital atrophy with prominence of the ventricles. No sign of acute infarction, mass lesion, hemorrhage, obstructive hydrocephalus or extra-axial collection. No skull fracture. No traumatic fluid in the sinuses, middle ears or mastoids. Chronic sclerosis in the right mastoid region.  CT CERVICAL SPINE FINDINGS  No cervical fracture. No traumatic malalignment. No soft tissue swelling.  There are prominent bridging osteophytes consistent with spondylosis 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 There is ordinary facet osteoarthritis.  IMPRESSION: Head CT: Atrophy and chronic ischemic changes. No acute or traumatic finding.  Cervical spine CT: Chronic spondylosis and ankylosis. No acute or traumatic  finding.   Electronically Signed   By: Nelson Chimes M.D.   On: 12/10/2014 21:57   Ct Cervical Spine Wo Contrast  12/10/2014   CLINICAL DATA:  Found on the floor after falling at home.  EXAM: CT HEAD WITHOUT CONTRAST  CT CERVICAL SPINE WITHOUT CONTRAST  TECHNIQUE: Multidetector CT imaging of the head and cervical spine was  performed following the standard protocol without intravenous contrast. Multiplanar CT image reconstructions of the cervical spine were also generated.  COMPARISON:  07/14/2014  FINDINGS: CT HEAD FINDINGS  There is generalized brain atrophy. There is chronic small vessel disease throughout the deep white matter. 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 There is chronic temporal and occipital atrophy with prominence of the ventricles. No sign of acute infarction, mass lesion, hemorrhage, obstructive hydrocephalus or extra-axial collection. No skull fracture. No traumatic fluid in the sinuses, middle ears or mastoids. Chronic sclerosis in the right mastoid region.  CT CERVICAL SPINE FINDINGS  No cervical fracture. No traumatic malalignment. No soft tissue swelling.  There are prominent bridging osteophytes consistent with spondylosis 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 There is ordinary facet osteoarthritis.  IMPRESSION: Head CT: Atrophy and chronic ischemic changes. No acute or traumatic finding.  Cervical spine CT: Chronic spondylosis and ankylosis. No acute or traumatic finding.   Electronically Signed   By: Nelson Chimes M.D.   On: 12/10/2014 21:57   Dg Chest Port 1 View  12/10/2014   CLINICAL DATA:  Preop femur fracture repair.  EXAM: PORTABLE CHEST - 1 VIEW  COMPARISON:  07/14/2014  FINDINGS: Patient is post median sternotomy. Cardiomediastinal contours are unchanged with mild cardiomegaly, heart size likely accentuated by technique. Lungs remain hyperinflated. No pulmonary edema, consolidation, pleural effusion or  pneumothorax. The bones are diffusely under mineralized.  IMPRESSION: Stable chronic change without acute process.   Electronically Signed   By: Jeb Levering M.D.   On: 12/10/2014 23:45   Dg C-arm 61-120 Min  12/11/2014   CLINICAL DATA:  Hardware fixation of a comminuted right subtrochanteric femoral fracture.  EXAM: RIGHT FEMUR 2 VIEWS; DG C-ARM 61-120 MIN  COMPARISON:  Right femur radiographs obtained yesterday.  FINDINGS: Six C arm views of the right femur demonstrate interval placement of an intramedullary rod and screws bridging the previously seen proximal right femur fracture. Improved position and alignment of the fragments with mild residual posterior displacement of the distal fragment.  IMPRESSION: Hardware fixation of the previously demonstrated proximal femur fracture.   Electronically Signed   By: Claudie Revering M.D.   On: 12/11/2014 15:07   Dg Femur, Min 2 Views Right  12/11/2014   CLINICAL DATA:  Hardware fixation of a comminuted right subtrochanteric femoral fracture.  EXAM: RIGHT FEMUR 2 VIEWS; DG C-ARM 61-120 MIN  COMPARISON:  Right femur radiographs obtained yesterday.  FINDINGS: Six C arm views of the right femur demonstrate interval placement of an intramedullary rod and screws bridging the previously seen proximal right femur fracture. Improved position and alignment of the fragments with mild residual posterior displacement of the distal fragment.  IMPRESSION: Hardware fixation of the previously demonstrated proximal femur fracture.   Electronically Signed   By: Claudie Revering M.D.   On: 12/11/2014 15:07   Dg Femur, Min 2 Views Right  12/10/2014   CLINICAL DATA:  [place] today.  Right leg pain.  EXAM: RIGHT FEMUR 2 VIEWS; PELVIS - 1-2 VIEW  COMPARISON:  None.  FINDINGS: There is a comminuted displaced subtrochanteric fracture of the femur. No definite pelvic fractures. Both hips are normally located.  IMPRESSION: Displaced subtrochanteric fracture of the right femur.   Electronically  Signed   By: Marijo Sanes M.D.   On: 12/10/2014 21:35    Oren Binet, MD  [facility] Pager:[phone]  If 7PM-7AM, please contact night-coverage [email or web address] Password [facility] 12/13/2014, 10:15 AM  LOS: 3 days
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
## 2014-12-13 NOTE — Progress Notes (Addendum)
Subjective:  Denies CP or dyspnea; difficulty swallowing pills. confused.   Objective:  Filed Vitals:   12/12/14 2130 12/13/14 0000 12/13/14 0400 12/13/14 0500  BP: 98/35   91/34  Pulse: 84   83  Temp: 98.5 F (36.9 C)   98.2 F (36.8 C)  TempSrc: Oral   Oral  Resp: 16 16 16 16   Weight:    167 lb 15.9 oz (76.2 kg)  SpO2: 94% 98% 96% 96%    Intake/Output from previous day:  Intake/Output Summary (Last 24 hours) at 12/13/14 1033 Last data filed at 12/13/14 0900  Gross per 24 hour  Intake 1749.17 ml  Output    500 ml  Net 1249.17 ml   . sodium chloride   Intravenous Once  . acetaminophen  1,000 mg Oral 3 times per day  . acetaminophen  650 mg Oral Once  . carvedilol  3.125 mg Oral BID WC  . Chlorhexidine Gluconate Cloth  6 each Topical Daily  . fenofibrate  54 mg Oral Daily  . furosemide  40 mg Intravenous Once  . levothyroxine  25 mcg Oral QAC breakfast  . midodrine  2.5 mg Oral BID WC  . mupirocin ointment  1 application Nasal BID  . polyethylene glycol  17 g Oral Daily  . senna-docusate  2 tablet Oral QHS  . sodium chloride  3 mL Intravenous Q12H  . sodium chloride  3 mL Intravenous Q12H  . tamsulosin  0.4 mg Oral QPC supper  . VASCULERA  1 tablet Oral Daily  . warfarin  5 mg Oral ONCE-1800  . Warfarin - Pharmacist Dosing Inpatient   Does not apply q1800   Physical Exam: Physical exam: Well-developed elderly, frail in no acute distress.  Skin is warm and dry.  HEENT is normal.  Neck is supple.  Chest decreased breath sounds Cardiovascular exam is irregular Abdominal exam nontender or distended. No masses palpated. Extremities s/p repair of hip fx     Lab Results: Basic Metabolic Panel:  Recent Labs  12/12/14 0520 12/13/14 0539  NA 142 143  K 4.0 3.9  CL 110 112  CO2 18* 21  GLUCOSE 140* 141*  BUN 64* 66*  CREATININE 2.99* 3.07*  CALCIUM 7.8* 8.0*   CBC:  Recent Labs  12/10/14 2034 12/11/14 0555  12/12/14 0520 12/13/14 0539  WBC  11.5* 8.6  --  8.1 7.2  NEUTROABS 9.1* 7.0  --   --   --   HGB 10.8* 9.7*  < > 8.7* 7.8*  HCT 32.8* 29.9*  < > 26.7* 24.0*  MCV 90.1 92.0  --  91.1 91.6  PLT 123* 116*  --  80* 68*  < > = values in this interval not displayed. Cardiac Enzymes:  Recent Labs  12/10/14 2235 12/11/14 0555 12/11/14 0900  TROPONINI 0.03 0.07* 0.08*     Assessment/Plan:  81 y.o.male with known history of CAD- s/p CABG '07, OM DES '08, CRI-IV, chronic atrial fib on coumadin, ICM with an EF of 35% per echo in 01/2011, hx CVA, and hypertension, who presented to hospital 12/10/14 after falling at home and sustaining a subtrochanteric fx of the right femur. Troponin was found to be mildly elevated with 0.03;0.07;0.08 respectively. We were asked to see patient pre op. He had surgery 12/11/14.     1 elevated troponin-minimal elevation with no chest pain. No plans for further ischemia evaluation. 2 atrial fibrillation-continue low-dose carvedilol as tolerated by blood pressure. Continue Coumadin. 3 ischemic cardiomyopathy-continue low-dose  beta blocker as tolerated by blood pressure. His blood pressure will not tolerate an ACE inhibitor or hydralazine nitrates. He is being hydrated. Would be careful as he is at high risk for volume excess given reduced LV function. Would most likely discontinue IV fluids tomorrow and resume low-dose diuretics. 4 status post repair of hip fracture-management per orthopedics. 5 chronic stage IV renal failure-follow renal function in-house.  Plan: He received one dose of Lasix 40 mg IV this am. No CHF on exam. SCr only slightly above baseline.   Kerin Ransom K PA 12/13/2014, 10:33 AM  Patient seen and examined. Agree with assessment and plan. Plateau to mild troponin elevation , no ACS. No chest pain.  Platelet count decreased to 68K from 123  3 days ago; renal function is slightly worse with Cr 3.07. AF on low dose carvedilol  At 3.125 bid; titrate if necessary if BP and orthostatics  allow. Currently getting PRBC Tx.  Troy Sine, MD, [facility] 12/13/2014 3:00 PM

## 2014-12-13 NOTE — Progress Notes (Signed)
ANTICOAGULATION CONSULT NOTE - FOLLOW UP  Pharmacy Consult:  Coumadin Indication: VTE prophylaxis  Allergies  Allergen Reactions  . Ace Inhibitors     Cannot remember the reaction    Patient Measurements: Weight: 167 lb 15.9 oz (76.2 kg)  Vital Signs: Temp: 97.9 F (36.6 C) (04/18 1140) Temp Source: Oral (04/18 1140) BP: 105/41 mmHg (04/18 1140) Pulse Rate: 67 (04/18 1140)  Labs:  Recent Labs  12/10/14 2235 12/11/14 0555 12/11/14 0900 12/11/14 1401 12/12/14 0520 12/13/14 0539  HGB  --  9.7*  --  7.5* 8.7* 7.8*  HCT  --  29.9*  --  22.0* 26.7* 24.0*  PLT  --  116*  --   --  80* 68*  LABPROT  --   --  20.2*  --  18.9* 19.5*  INR  --   --  1.71*  --  1.57* 1.63*  CREATININE  --  2.88*  --   --  2.99* 3.07*  TROPONINI 0.03 0.07* 0.08*  --   --   --     Estimated Creatinine Clearance: 16.4 mL/min (by C-G formula based on Cr of 3.07).    Assessment: 75 YOM with history of Afib on Coumadin PTA.  Patient was admitted for a femur fracture so Coumadin was reversed and is now s/p repair on 4/16.  Coumadin was resumed post-op.  INR remains sub-therapeutic but is trending up.  His hemoglobin and platelets continue to trend down.    Goal of Therapy:  INR 2-3     Plan:  - Coumadin 5mg  PO today, may need to reduce dose in AM - Daily PT / INR - MD to advise should Coumadin be held for ABLA - Monitor for bleeding    Kenwood Rosiak D. Mina Marble, PharmD, BCPS Pager:  [phone] 12/13/2014, 2:04 PM

## 2014-12-14 ENCOUNTER — Inpatient Hospital Stay (HOSPITAL_COMMUNITY): Payer: Medicare Other

## 2014-12-14 DIAGNOSIS — S72141A Displaced intertrochanteric fracture of right femur, initial encounter for closed fracture: Secondary | ICD-10-CM

## 2014-12-14 DIAGNOSIS — I10 Essential (primary) hypertension: Secondary | ICD-10-CM

## 2014-12-14 DIAGNOSIS — D696 Thrombocytopenia, unspecified: Secondary | ICD-10-CM | POA: Insufficient documentation

## 2014-12-14 LAB — TYPE AND SCREEN
ABO/RH(D): O POS
ANTIBODY SCREEN: NEGATIVE
UNIT DIVISION: 0
UNIT DIVISION: 0
UNIT DIVISION: 0

## 2014-12-14 LAB — BASIC METABOLIC PANEL
Anion gap: 11 (ref 5–15)
BUN: 67 mg/dL — AB (ref 6–23)
CO2: 23 mmol/L (ref 19–32)
Calcium: 8.5 mg/dL (ref 8.4–10.5)
Chloride: 114 mmol/L — ABNORMAL HIGH (ref 96–112)
Creatinine, Ser: 2.92 mg/dL — ABNORMAL HIGH (ref 0.50–1.35)
GFR, EST AFRICAN AMERICAN: 20 mL/min — AB (ref >90)
GFR, EST NON AFRICAN AMERICAN: 17 mL/min — AB (ref >90)
Glucose, Bld: 146 mg/dL — ABNORMAL HIGH (ref 70–99)
POTASSIUM: 4 mmol/L (ref 3.5–5.1)
Sodium: 148 mmol/L — ABNORMAL HIGH (ref 135–145)

## 2014-12-14 LAB — CBC
HEMATOCRIT: 27.5 % — AB (ref 39.0–52.0)
HEMOGLOBIN: 9.1 g/dL — AB (ref 13.0–17.0)
MCH: 29.8 pg (ref 26.0–34.0)
MCHC: 33.1 g/dL (ref 30.0–36.0)
MCV: 90.2 fL (ref 78.0–100.0)
PLATELETS: 78 10*3/uL — AB (ref 150–400)
RBC: 3.05 MIL/uL — ABNORMAL LOW (ref 4.22–5.81)
RDW: 15.6 % — AB (ref 11.5–15.5)
WBC: 7 10*3/uL (ref 4.0–10.5)

## 2014-12-14 LAB — PROTIME-INR
INR: 1.68 — AB (ref 0.00–1.49)
Prothrombin Time: 19.9 seconds — ABNORMAL HIGH (ref 11.6–15.2)

## 2014-12-14 MED ORDER — RESOURCE THICKENUP CLEAR PO POWD
ORAL | Status: DC | PRN
  Filled 2014-12-14: qty 125

## 2014-12-14 MED ORDER — WARFARIN SODIUM 5 MG PO TABS
5.0000 mg | ORAL_TABLET | Freq: Once | ORAL | Status: AC
  Administered 2014-12-14: 5 mg via ORAL
  Filled 2014-12-14: qty 1

## 2014-12-14 MED ORDER — FUROSEMIDE 40 MG PO TABS
40.0000 mg | ORAL_TABLET | Freq: Every day | ORAL | Status: DC
  Administered 2014-12-14 – 2014-12-15 (×2): 40 mg via ORAL
  Filled 2014-12-14 (×2): qty 1

## 2014-12-14 NOTE — Progress Notes (Addendum)
Occupational Therapy Treatment Patient Details Name: [name] CHAVARRIA Sr. MRN: [id number] DOB: Dec 24, 1919 Today's Date: 12/14/2014    History of present illness 81 yo male with hx of CAD , CKD stage 4, CM (EF 35-40%), Anemia, hypothyroidism, CVA, apparently c/o fall, unwittnessed. Pt doesn't think that there was syncope but can't rule it out. Pt was brought to ED by family and found to have right femur fracture. He underwent IM nailing 12-11-14.   OT comments  Pt progressing. Able to sit EOB and perform ADL tasks. Continue to recommend SNF for rehab.  Follow Up Recommendations  SNF;Supervision/Assistance - 24 hour    Equipment Recommendations  Wheelchair (measurements OT);Wheelchair cushion (measurements OT);3 in 1 bedside comode;Hospital bed    Recommendations for Other Services      Precautions / Restrictions Precautions Precautions: Fall Restrictions Weight Bearing Restrictions: Yes RLE Weight Bearing: Partial weight bearing RLE Partial Weight Bearing Percentage or Pounds: 50%       Mobility Bed Mobility Overal bed mobility: Needs Assistance Bed Mobility: Supine to Sit;Sit to Supine     Supine to sit: Min assist Sit to supine: Mod assist   General bed mobility comments: Cues for technique. Pt able to move to EOB with min assist for RLE. Mod A to return to supine. +2 assist to scoot HOB.   Transfers                 General transfer comment: not assessed    Balance  Pt able to sit EOB without physical assist for balance. OT helped position pt's RLE better sitting EOB.                                 ADL Overall ADL's : Needs assistance/impaired Eating/Feeding: Bed level;Set up;Supervision/ safety   Grooming: Wash/dry face;Set up;Supervision/safety;Sitting   Upper Body Bathing: Set up;Supervision/ safety;Sitting (washed armpits sitting EOB)                             General ADL Comments: OT assisted with pt's gown so he could  wash armpits. Sat EOB and performed UB bathing and grooming.  Pt returned to bed and OT positioned him for his dinner-pt able to self feed and OT instructed to take small bites and to swallow a few times with each bite (daughter present for pt to continue meal).      Vision                     Perception     Praxis      Cognition  Awake/Alert Behavior During Therapy: WFL for tasks assessed/performed Overall Cognitive Status:  (unsure of baseline) Area of Impairment: Orientation;Problem solving Orientation Level: Disoriented to;Place            Problem Solving: Slow processing      Extremity/Trunk Assessment               Exercises     Shoulder Instructions       General Comments      Pertinent Vitals/ Pain       Pain Assessment: 0-10 Pain Score: 7  Pain Location: Rt hip Pain Descriptors / Indicators: Sore Pain Intervention(s): Repositioned;Monitored during session  Home Living  Prior Functioning/Environment              Frequency Min 2X/week     Progress Toward Goals  OT Goals(current goals can now be found in the care plan section)  Progress towards OT goals: Progressing toward goals-updated bed mobility goal  Acute Rehab OT Goals Patient Stated Goal: not stated OT Goal Formulation: With patient/family Time For Goal Achievement: 12/27/14 Potential to Achieve Goals: Good ADL Goals Pt Will Perform Grooming: with set-up;sitting Pt Will Perform Upper Body Bathing: with set-up;sitting Pt Will Transfer to Toilet: with +2 assist;with min assist;bedside commode Additional ADL Goal #1: Pt will complete bed mobility at Min assist level.  Plan Discharge plan remains appropriate    Co-evaluation                 End of Session     Activity Tolerance Patient tolerated treatment well   Patient Left in bed;with call bell/phone within reach;with bed alarm set;with  family/visitor present;with nursing/sitter in room   Nurse Communication Other (comment) (assisted with scooting pt to [facility] and saw him working with OT)        Time: 5329-9242 OT Time Calculation (min): 16 min  Charges: OT General Charges $OT Visit: 1 Procedure OT Treatments $Self Care/Home Management : 8-22 mins  Benito Mccreedy OTR/L [phone] 12/14/2014, 5:17 PM

## 2014-12-14 NOTE — Progress Notes (Signed)
Spoke with doctor 12/14/14 [phone]. Foley is to be D/C'd today. Swallow test is to be performed today. Coreg is to be adjusted. Ultram should be used for pain instead of narcotics. Narcotics should be used sparingly if at all per doctor. Social worker was notified in progression rounds that there will be a possible discharge tomorrow.

## 2014-12-14 NOTE — Progress Notes (Signed)
Physical Therapy Treatment Patient Details Name: RAYVION STUMPH Sr. MRN: [id number] DOB: 09-11-1919 Today's Date: 12/14/2014    History of Present Illness 81 yo male with hx of CAD , CKD stage 4, CM (EF 35-40%), Anemia, hypothyroidism, CVA, apparently c/o fall, unwittnessed. Pt doesn't think that there was syncope but can't rule it out. Pt was brought to ED by family and found to have right femur fracture. He underwent IM nailing 12-11-14.    PT Comments    Pt is making progress towards goals and able to participate in more functional activities this AM. Pt's daughter and wife were present during session and pleased with pt's progress. Notified nursing that pt would benefit from stedy for transfers in/out of bed. Pt would benefit from continued PT to increase functional independence. Continue with POC.  Follow Up Recommendations  SNF;Supervision/Assistance - 24 hour     Equipment Recommendations  Rolling walker with 5" wheels    Recommendations for Other Services       Precautions / Restrictions Precautions Precautions: Fall Restrictions Weight Bearing Restrictions: Yes RLE Weight Bearing: Partial weight bearing RLE Partial Weight Bearing Percentage or Pounds: 50%    Mobility  Bed Mobility Overal bed mobility: Needs Assistance;+2 for physical assistance;+ 2 for safety/equipment Bed Mobility: Supine to Sit;Sit to Supine     Supine to sit: +2 for physical assistance;+2 for safety/equipment;Mod assist;HOB elevated Sit to supine: Max assist;+2 for physical assistance;+2 for safety/equipment;HOB elevated   General bed mobility comments: Cues for sequencing and technique. Significantly increased time.  Transfers Overall transfer level: Needs assistance Equipment used: Rolling walker (2 wheeled) Transfers: Sit to/from Stand Sit to Stand: Mod assist;+2 physical assistance;+2 safety/equipment         General transfer comment: Mod A +2 to power up into standing. Mod A to  position trunk upright in standing.  Ambulation/Gait Ambulation/Gait assistance: Mod assist;+2 physical assistance;+2 safety/equipment Ambulation Distance (Feet): 2 Feet Assistive device: Rolling walker (2 wheeled) Gait Pattern/deviations: Shuffle;Decreased weight shift to right   Gait velocity interpretation: Below normal speed for age/gender General Gait Details: Able to side step towards [facility] with Mod A to position LEs with stepping. Cues for WB through walker to maintain PWB status on RLE.   Stairs            Wheelchair Mobility    Modified Rankin (Stroke Patients Only)       Balance Overall balance assessment: Needs assistance Sitting-balance support: No upper extremity supported;Feet supported Sitting balance-Leahy Scale: Fair Sitting balance - Comments: Pt able to work on sitting balance and perform functional activities while maintaining nonsupported sitting balance. Postural control: Posterior lean Standing balance support: Bilateral upper extremity supported Standing balance-Leahy Scale: Poor Standing balance comment: Requires use of RW to maintain balance, but able to weight shift with +2 assist.                    Cognition Arousal/Alertness: Awake/alert Behavior During Therapy: WFL for tasks assessed/performed Overall Cognitive Status: Impaired/Different from baseline Area of Impairment: Orientation;Memory;Following commands;Awareness Orientation Level: Disoriented to;Place;Time;Situation Current Attention Level: Sustained Memory: Decreased recall of precautions;Decreased short-term memory Following Commands: Follows one step commands inconsistently;Follows one step commands with increased time     Problem Solving: Slow processing;Decreased initiation;Difficulty sequencing;Requires verbal cues;Requires tactile cues      Exercises      General Comments        Pertinent Vitals/Pain Pain Assessment: Faces Faces Pain Scale: Hurts a little  bit Pain Location: R hip  Pain Descriptors / Indicators: Dull Pain Intervention(s): Monitored during session    Home Living                      Prior Function            PT Goals (current goals can now be found in the care plan section) Progress towards PT goals: Progressing toward goals    Frequency  Min 3X/week    PT Plan Current plan remains appropriate    Co-evaluation             End of Session Equipment Utilized During Treatment: Gait belt Activity Tolerance: Patient tolerated treatment well Patient left: in bed;with call bell/phone within reach;with family/visitor present     Time: 7001-7494 PT Time Calculation (min) (ACUTE ONLY): 32 min  Charges:                       G CodesRubye Oaks, SPTA 12/14/2014, 10:50 AM

## 2014-12-14 NOTE — Progress Notes (Signed)
ANTICOAGULATION CONSULT NOTE - FOLLOW UP  Pharmacy Consult:  Coumadin  Indication: VTE prophylaxis  Allergies  Allergen Reactions  . Ace Inhibitors     Cannot remember the reaction    Patient Measurements: Weight: 164 lb (74.39 kg)  Vital Signs: Temp: 98.5 F (36.9 C) (04/19 1300) Temp Source: Axillary (04/19 0604) BP: 121/51 mmHg (04/19 1300) Pulse Rate: 73 (04/19 1300)  Labs:  Recent Labs  12/12/14 0520 12/13/14 0539 12/14/14 0504  HGB 8.7* 7.8* 9.1*  HCT 26.7* 24.0* 27.5*  PLT 80* 68* 78*  LABPROT 18.9* 19.5* 19.9*  INR 1.57* 1.63* 1.68*  CREATININE 2.99* 3.07* 2.92*    Estimated Creatinine Clearance: 17 mL/min (by C-G formula based on Cr of 2.92).    Assessment: 64 YOM with history of Afib on Coumadin PTA.  Patient was admitted for a femur fracture so Coumadin was reversed and is now s/p repair on 4/16.  Coumadin was resumed post-op.  INR remains sub-therapeutic but is trending up. H/H and Plt now starting to trend back up.     Goal of Therapy:  INR 2-3    Plan:  - Coumadin 5mg  PO today. Consider reducing dose once INR is near therapeutic.  - Daily PT / INR - Monitor for bleeding    Albertina Parr, PharmD., BCPS Clinical Pharmacist Pager [phone]

## 2014-12-14 NOTE — Progress Notes (Signed)
PATIENT DETAILS Name: [name] Amos Sr. Age: 81 y.o. Sex: male Date of Birth: [birth year] Admit Date: 12/10/2014 Admitting Physician Jani Gravel, MD HDQ:[id number] Ronnald Ramp, MD  Subjective: Much more awake and alert. No SOB today. Daughter and spouse at bedside.  Assessment/Plan: Active Problems: Closed Hip fracture:secondary to a mechanical fall. Seen by cards pre-operatively, then underwent repair of left hip on 4/16. Per op note, 500 cc of blood loss, subsequently transfused 2 units of PRBC. Unfortunately, post operative course has been complicated by development of perioperative blood loss anemia, transient hypotension and worsening renal function. Orthopedics continues to follow, blood pressure much better.continue Coumadin for DVT prophylaxis, continue Tylenol for pain, continue to use narcotics as sparingly as possible. Will require SNF on discharge-suspect on 4/20.  Transient Hypotension:Resolved.Occurred on 4/17 am-suspect secondary to Narcotics, and post operative blood loss. Continue to minimize narcotics.  Acute perioperative blood loss anemia: transfused 3 units of PRBC so far. Hb stable, follow periodically.  Acute on chronic kidney disease stage IV: Secondary to acute illness, hypotension, blood loss. Creatinine much better today, resume Lasix. Have asked RN to continue to monitor intake/output closely. Daily weights.  Atrial Fibrillation:continue rate control with Coreg (Holding parameters in place), Coumadin resumed, INR still subtherapeutic. Follow.   Chronic systolic CHF:EF of 89%-21% by Echo in 2012. Remains compensated.  Cautiously continue with Coreg (blood pressure permitting), not a candidate for ACEI/ARB given CKD stage 4. Resumed Lasix  Thrombocytopenia:chronic issue,slightly worse than baseline. Follow  JHE:RDEYCXK not on ASA as on coumadin. Minimal troponin elevated is likely secondary to CKD  Dyslipidemia:continue Fenofibrate  Hypothyroidism:continue  Synthroid  GYJ:EHUDJSHF [place].  Disposition: Remain inpatient-SNF on discharge on 4/20  Antibiotics:  See below   Anti-infectives    Start     Dose/Rate Route Frequency Ordered Stop   12/12/14 0030  ceFAZolin (ANCEF) IVPB 2 g/50 mL premix     2 g 100 mL/hr over 30 Minutes Intravenous Every 12 hours 12/11/14 1613 12/12/14 0014      DVT Prophylaxis: coumadin  Code Status: DO NOT RESUSCITATE  Family Communication Daughter/Spouse at bedside  Procedures:  4/16:Intramedullary nail of righ hip  CONSULTS:  cardiology and orthopedic surgery  MEDICATIONS: Scheduled Meds: . sodium chloride   Intravenous Once  . acetaminophen  1,000 mg Oral 3 times per day  . acetaminophen  650 mg Oral Once  . carvedilol  3.125 mg Oral BID WC  . Chlorhexidine Gluconate Cloth  6 each Topical Daily  . fenofibrate  54 mg Oral Daily  . furosemide  40 mg Oral Daily  . levothyroxine  25 mcg Oral QAC breakfast  . mupirocin ointment  1 application Nasal BID  . polyethylene glycol  17 g Oral Daily  . senna-docusate  2 tablet Oral QHS  . sodium chloride  3 mL Intravenous Q12H  . sodium chloride  3 mL Intravenous Q12H  . tamsulosin  0.4 mg Oral QPC supper  . VASCULERA  1 tablet Oral Daily  . Warfarin - Pharmacist Dosing Inpatient   Does not apply q1800   Continuous Infusions:   PRN Meds:.sodium chloride, menthol-cetylpyridinium **OR** phenol, methocarbamol **OR** methocarbamol (ROBAXIN)  IV, metoCLOPramide **OR** metoCLOPramide (REGLAN) injection, nitroGLYCERIN, ondansetron **OR** ondansetron (ZOFRAN) IV, sodium chloride, traMADol    PHYSICAL EXAM: Vital signs in last 24 hours: Filed Vitals:   12/14/14 0000 12/14/14 0400 12/14/14 0604 12/14/14 0800  BP:   112/48   Pulse:   82   Temp:  97.5 F (36.4 C)   TempSrc:   Axillary   Resp: 16 17 17 17   Weight:   74.39 kg (164 lb)   SpO2:   99%     Weight change: -1.81 kg (-3 lb 15.9 oz) Filed Weights   12/12/14 0545 12/13/14 0500  12/14/14 0604  Weight: 75.297 kg (166 lb) 76.2 kg (167 lb 15.9 oz) 74.39 kg (164 lb)   Body mass index is 22.88 kg/(m^2).   Gen Exam: Awake, much more awake and alert. Not in any distress. Mildly confused. Neck: Supple, No JVD.   Chest: B/L Clear. Clear today CVS: S1 S2 Regular, no murmurs.  Abdomen: soft, BS +, non tender, non distended.  Extremities: no edema, lower extremities warm to touch.Incision in right lateral thigh appears intact without any erythema/discharge.  Neurologic: Non Focal.   Skin: No Rash.   Wounds: N/A.   Intake/Output from previous day:  Intake/Output Summary (Last 24 hours) at 12/14/14 1149 Last data filed at 12/14/14 0850  Gross per 24 hour  Intake    260 ml  Output    775 ml  Net   -515 ml     LAB RESULTS: CBC  Recent Labs Lab 12/10/14 2034 12/11/14 0555 12/11/14 1401 12/12/14 0520 12/13/14 0539 12/14/14 0504  WBC 11.5* 8.6  --  8.1 7.2 7.0  HGB 10.8* 9.7* 7.5* 8.7* 7.8* 9.1*  HCT 32.8* 29.9* 22.0* 26.7* 24.0* 27.5*  PLT 123* 116*  --  80* 68* 78*  MCV 90.1 92.0  --  91.1 91.6 90.2  MCH 29.7 29.8  --  29.7 29.8 29.8  MCHC 32.9 32.4  --  32.6 32.5 33.1  RDW 14.8 14.8  --  15.0 15.2 15.6*  LYMPHSABS 1.4 0.9  --   --   --   --   MONOABS 0.7 0.8  --   --   --   --   EOSABS 0.3 0.0  --   --   --   --   BASOSABS 0.0 0.0  --   --   --   --     Chemistries   Recent Labs Lab 12/10/14 2034 12/11/14 0555 12/11/14 1401 12/12/14 0520 12/13/14 0539 12/14/14 0504  NA 139 140 142 142 143 148*  K 4.1 4.3 3.9 4.0 3.9 4.0  CL 106 105  --  110 112 114*  CO2 20 19  --  18* 21 23  GLUCOSE 152* 151* 148* 140* 141* 146*  BUN 70* 67*  --  64* 66* 67*  CREATININE 2.83* 2.88*  --  2.99* 3.07* 2.92*  CALCIUM 8.6 8.3*  --  7.8* 8.0* 8.5    CBG:  Recent Labs Lab 12/10/14 2030 12/12/14 0545 12/13/14 0558  GLUCAP 146* 119* 132*    GFR Estimated Creatinine Clearance: 17 mL/min (by C-G formula based on Cr of 2.92).  Coagulation  profile  Recent Labs Lab 12/10/14 2034 12/11/14 0900 12/12/14 0520 12/13/14 0539 12/14/14 0504  INR 2.03* 1.71* 1.57* 1.63* 1.68*    Cardiac Enzymes  Recent Labs Lab 12/10/14 2235 12/11/14 0555 12/11/14 0900  TROPONINI 0.03 0.07* 0.08*    Invalid input(s): POCBNP No results for input(s): DDIMER in the last 72 hours. No results for input(s): HGBA1C in the last 72 hours. No results for input(s): CHOL, HDL, LDLCALC, TRIG, CHOLHDL, LDLDIRECT in the last 72 hours. No results for input(s): TSH, T4TOTAL, T3FREE, THYROIDAB in the last 72 hours.  Invalid input(s): FREET3 No results for input(s): VITAMINB12, FOLATE,  FERRITIN, TIBC, IRON, RETICCTPCT in the last 72 hours. No results for input(s): LIPASE, AMYLASE in the last 72 hours.  Urine Studies No results for input(s): UHGB, CRYS in the last 72 hours.  Invalid input(s): UACOL, UAPR, USPG, UPH, UTP, UGL, UKET, UBIL, UNIT, UROB, ULEU, UEPI, UWBC, URBC, UBAC, CAST, UCOM, BILUA  MICROBIOLOGY: Recent Results (from the past 240 hour(s))  Surgical pcr screen     Status: Abnormal   Collection Time: 12/11/14  8:38 AM  Result Value Ref Range Status   MRSA, PCR NEGATIVE NEGATIVE Final   Staphylococcus aureus POSITIVE (A) NEGATIVE Final    Comment:        The Xpert SA Assay (FDA approved for NASAL specimens in patients over 28 years of age), is one component of a comprehensive surveillance program.  Test performance has been validated by [facility] for patients greater than or equal to 62 year old. It is not intended to diagnose infection nor to guide or monitor treatment.     RADIOLOGY STUDIES/RESULTS: Dg Pelvis 1-2 Views  12/10/2014   CLINICAL DATA:  [place] today.  Right leg pain.  EXAM: RIGHT FEMUR 2 VIEWS; PELVIS - 1-2 VIEW  COMPARISON:  None.  FINDINGS: There is a comminuted displaced subtrochanteric fracture of the femur. No definite pelvic fractures. Both hips are normally located.  IMPRESSION: Displaced subtrochanteric  fracture of the right femur.   Electronically Signed   By: Marijo Sanes M.D.   On: 12/10/2014 21:35   Ct Head Wo Contrast  12/10/2014   CLINICAL DATA:  Found on the floor after falling at home.  EXAM: CT HEAD WITHOUT CONTRAST  CT CERVICAL SPINE WITHOUT CONTRAST  TECHNIQUE: Multidetector CT imaging of the head and cervical spine was performed following the standard protocol without intravenous contrast. Multiplanar CT image reconstructions of the cervical spine were also generated.  COMPARISON:  07/14/2014  FINDINGS: CT HEAD FINDINGS  There is generalized brain atrophy. There is chronic small vessel disease throughout the deep white matter. 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 There is chronic temporal and occipital atrophy with prominence of the ventricles. No sign of acute infarction, mass lesion, hemorrhage, obstructive hydrocephalus or extra-axial collection. No skull fracture. No traumatic fluid in the sinuses, middle ears or mastoids. Chronic sclerosis in the right mastoid region.  CT CERVICAL SPINE FINDINGS  No cervical fracture. No traumatic malalignment. No soft tissue swelling.  There are prominent bridging osteophytes consistent with spondylosis 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 There is ordinary facet osteoarthritis.  IMPRESSION: Head CT: Atrophy and chronic ischemic changes. No acute or traumatic finding.  Cervical spine CT: Chronic spondylosis and ankylosis. No acute or traumatic finding.   Electronically Signed   By: Nelson Chimes M.D.   On: 12/10/2014 21:57   Ct Cervical Spine Wo Contrast  12/10/2014   CLINICAL DATA:  Found on the floor after falling at home.  EXAM: CT HEAD WITHOUT CONTRAST  CT CERVICAL SPINE WITHOUT CONTRAST  TECHNIQUE: Multidetector CT imaging of the head and cervical spine was performed following the standard protocol without intravenous contrast. Multiplanar CT image reconstructions of the cervical  spine were also generated.  COMPARISON:  07/14/2014  FINDINGS: CT HEAD FINDINGS  There is generalized brain atrophy. There is chronic small vessel disease throughout the deep white matter. 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 There is chronic temporal and occipital atrophy with prominence of the ventricles. No sign  of acute infarction, mass lesion, hemorrhage, obstructive hydrocephalus or extra-axial collection. No skull fracture. No traumatic fluid in the sinuses, middle ears or mastoids. Chronic sclerosis in the right mastoid region.  CT CERVICAL SPINE FINDINGS  No cervical fracture. No traumatic malalignment. No soft tissue swelling.  There are prominent bridging osteophytes consistent with spondylosis 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 There is ordinary facet osteoarthritis.  IMPRESSION: Head CT: Atrophy and chronic ischemic changes. No acute or traumatic finding.  Cervical spine CT: Chronic spondylosis and ankylosis. No acute or traumatic finding.   Electronically Signed   By: Nelson Chimes M.D.   On: 12/10/2014 21:57   Dg Chest Port 1 View  12/10/2014   CLINICAL DATA:  Preop femur fracture repair.  EXAM: PORTABLE CHEST - 1 VIEW  COMPARISON:  07/14/2014  FINDINGS: Patient is post median sternotomy. Cardiomediastinal contours are unchanged with mild cardiomegaly, heart size likely accentuated by technique. Lungs remain hyperinflated. No pulmonary edema, consolidation, pleural effusion or pneumothorax. The bones are diffusely under mineralized.  IMPRESSION: Stable chronic change without acute process.   Electronically Signed   By: Jeb Levering M.D.   On: 12/10/2014 23:45   Dg C-arm 61-120 Min  12/11/2014   CLINICAL DATA:  Hardware fixation of a comminuted right subtrochanteric femoral fracture.  EXAM: RIGHT FEMUR 2 VIEWS; DG C-ARM 61-120 MIN  COMPARISON:  Right femur radiographs obtained yesterday.  FINDINGS: Six C arm views of the  right femur demonstrate interval placement of an intramedullary rod and screws bridging the previously seen proximal right femur fracture. Improved position and alignment of the fragments with mild residual posterior displacement of the distal fragment.  IMPRESSION: Hardware fixation of the previously demonstrated proximal femur fracture.   Electronically Signed   By: Claudie Revering M.D.   On: 12/11/2014 15:07   Dg Femur, Min 2 Views Right  12/11/2014   CLINICAL DATA:  Hardware fixation of a comminuted right subtrochanteric femoral fracture.  EXAM: RIGHT FEMUR 2 VIEWS; DG C-ARM 61-120 MIN  COMPARISON:  Right femur radiographs obtained yesterday.  FINDINGS: Six C arm views of the right femur demonstrate interval placement of an intramedullary rod and screws bridging the previously seen proximal right femur fracture. Improved position and alignment of the fragments with mild residual posterior displacement of the distal fragment.  IMPRESSION: Hardware fixation of the previously demonstrated proximal femur fracture.   Electronically Signed   By: Claudie Revering M.D.   On: 12/11/2014 15:07   Dg Femur, Min 2 Views Right  12/10/2014   CLINICAL DATA:  [place] today.  Right leg pain.  EXAM: RIGHT FEMUR 2 VIEWS; PELVIS - 1-2 VIEW  COMPARISON:  None.  FINDINGS: There is a comminuted displaced subtrochanteric fracture of the femur. No definite pelvic fractures. Both hips are normally located.  IMPRESSION: Displaced subtrochanteric fracture of the right femur.   Electronically Signed   By: Marijo Sanes M.D.   On: 12/10/2014 21:35    Oren Binet, MD  [facility] Pager:[phone]  If 7PM-7AM, please contact night-coverage [email or web address] Password [email or web address] 12/14/2014, 11:49 AM   LOS: 4 days

## 2014-12-14 NOTE — Progress Notes (Signed)
Subjective:  Denies CP or dyspnea. He is in the middle of a swallowing study this am.   Objective:  Filed Vitals:   12/14/14 0000 12/14/14 0400 12/14/14 0604 12/14/14 0800  BP:   112/48   Pulse:   82   Temp:   97.5 F (36.4 C)   TempSrc:   Axillary   Resp: 16 17 17 17   Weight:   164 lb (74.39 kg)   SpO2:   99%     Intake/Output from previous day:  Intake/Output Summary (Last 24 hours) at 12/14/14 1128 Last data filed at 12/14/14 0850  Gross per 24 hour  Intake    260 ml  Output    775 ml  Net   -515 ml   . sodium chloride   Intravenous Once  . acetaminophen  1,000 mg Oral 3 times per day  . acetaminophen  650 mg Oral Once  . carvedilol  3.125 mg Oral BID WC  . Chlorhexidine Gluconate Cloth  6 each Topical Daily  . fenofibrate  54 mg Oral Daily  . levothyroxine  25 mcg Oral QAC breakfast  . mupirocin ointment  1 application Nasal BID  . polyethylene glycol  17 g Oral Daily  . senna-docusate  2 tablet Oral QHS  . sodium chloride  3 mL Intravenous Q12H  . sodium chloride  3 mL Intravenous Q12H  . tamsulosin  0.4 mg Oral QPC supper  . VASCULERA  1 tablet Oral Daily  . Warfarin - Pharmacist Dosing Inpatient   Does not apply q1800   Physical Exam: Physical exam: Well-developed elderly, frail in no acute distress.  Skin is warm and dry.  HEENT is normal.  Neck is supple.  Chest decreased breath sounds Cardiovascular exam is irregular Abdominal exam nontender or distended. No masses palpated. Extremities s/p repair of hip fx     Lab Results: Basic Metabolic Panel:  Recent Labs  12/13/14 0539 12/14/14 0504  NA 143 148*  K 3.9 4.0  CL 112 114*  CO2 21 23  GLUCOSE 141* 146*  BUN 66* 67*  CREATININE 3.07* 2.92*  CALCIUM 8.0* 8.5   CBC:  Recent Labs  12/13/14 0539 12/14/14 0504  WBC 7.2 7.0  HGB 7.8* 9.1*  HCT 24.0* 27.5*  MCV 91.6 90.2  PLT 68* 78*   Cardiac Enzymes: No results for input(s): CKTOTAL, CKMB, CKMBINDEX, TROPONINI in the last  72 hours.   Assessment/Plan:  81 y.o.male with known history of CAD- s/p CABG '07, OM DES '08, CRI-IV, chronic atrial fib on coumadin, ICM with an EF of 35% per echo in 01/2011, hx CVA, and hypertension, who presented to hospital 12/10/14 after falling at home and sustaining a subtrochanteric fx of the right femur. Troponin was found to be mildly elevated with 0.03;0.07;0.08 respectively. We were asked to see patient pre op. He had surgery 12/11/14.     1 elevated troponin-minimal elevation with no chest pain. No plans for further ischemia evaluation. 2 atrial fibrillation-continue low-dose carvedilol as tolerated by blood pressure. Continue Coumadin. 3 ischemic cardiomyopathy-continue low-dose beta blocker as tolerated by blood pressure. His blood pressure will not tolerate an ACE inhibitor or hydralazine nitrates. He is being hydrated. Would be careful as he is at high risk for volume excess given reduced LV function. Would most likely discontinue IV fluids tomorrow and resume low-dose diuretics. 4 status post repair of hip fracture-management per orthopedics. 5 chronic stage IV renal failure-follow renal function in-house.  Plan: B/P and SCr  stable. F/U with Dr Percival Spanish after he goes through rehab.   Kerin Ransom K PA 12/14/2014, 11:28 AM   Patient seen and examined. Agree with assessment and plan. No chest pain. Breathing better. Cr slightly improved today 3.07 to 2.92.  No bleeding; platelets slightly better 68 to 78K.   Troy Sine, MD, [facility] 12/14/2014 2:46 PM

## 2014-12-14 NOTE — Evaluation (Signed)
Clinical/Bedside Swallow Evaluation Patient Details  Name: [name] Sr. MRN: [id number] Date of Birth: 11-Apr-1920  Today's Date: 12/14/2014 Time: SLP Start Time (ACUTE ONLY): 1100 SLP Stop Time (ACUTE ONLY): 1127 SLP Time Calculation (min) (ACUTE ONLY): 27 min  Past Medical History:  Past Medical History  Diagnosis Date  . Coronary artery disease     a.  s/p MI;  b. s/p CABG 2007;  c. cath 12/08: LM occluded, pRCA 80%, dRCA 50%, PLB 90%, S-PDA ok, S-Dx ok with occluded dist Dx; L-LAD ok, S-OM ok with 90% after graft insertion . . . . distal OM treated with DES  . Ischemic cardiomyopathy     echo 6/12 EF 35-40%, apical, dAnt, mid to dist inf-lat HK, mild AI, mod MR, mod BAE, PASP 44  . Chronic combined systolic and diastolic heart failure     admx 8/13;   . Hypertension   . Atrial fibrillation     a. on coumadin.  . CKD (chronic kidney disease), stage IV   . CVA (cerebral infarction)     left frontal  . Anemia     chronic  . Hypothyroidism   . Leg muscle spasm   . GERD (gastroesophageal reflux disease)   . Skin cancer    Past Surgical History:  Past Surgical History  Procedure Laterality Date  . Coronary artery bypass graft  2007  . Coronary angioplasty with stent placement    . Femur im nail Right 12/11/2014    Procedure: INTRAMEDULLARY (IM) NAIL FEMORAL;  Surgeon: Mcarthur Rossetti, MD;  Location: [facility];  Service: Orthopedics;  Laterality: Right;   HPI:  81 yo male with hx of CAD , CKD stage 4, CM (EF 35-40%), Anemia, hypothyroidism, CVA, apparently c/o fall, unwittnessed. Pt doesn't think that there was syncope but can't rule it out. Pt was brought to ED by family and found to have right femur fracture. He underwent IM n81 yo male with hx of CAD , CKD stage 4, CM (EF 35-40%), Anemia, hypothyroidism, CVA, apparently c/o fall, unwittnessed. Pt doesn't think that there was syncope but can't rule it out. Pt was brought to ED by family and found to have right femur  fracture. He underwent IM nailing 12-11-14.ailing 12-11-14.   Assessment / Plan / Recommendation Clinical Impression  Pt has evidence of dysphagia including multiple audible swallows per bolus, with signs concerning for aspiration that include delayed throat clearing, immediate coughing, and wet vocal quality with all consistencies tested. SLP intervention for manipulation of boluses and postural strategies did not appear to increase airway protection. Recommend to proceed with objective testing to determine least restrictive diet.    Aspiration Risk  Moderate    Diet Recommendation NPO   Medication Administration: Via alternative means    Other  Recommendations Recommended Consults: MBS Oral Care Recommendations: Oral care Q4 per protocol   Follow Up Recommendations       Frequency and Duration        Pertinent Vitals/Pain n/a    SLP Swallow Goals     Swallow Study Prior Functional Status       General HPI: 81 yo male with hx of CAD , CKD stage 4, CM (EF 35-40%), Anemia, hypothyroidism, CVA, apparently c/o fall, unwittnessed. Pt doesn't think that there was syncope but can't rule it out. Pt was brought to ED by family and found to have right femur fracture. He underwent IM n81 yo male with hx of CAD , CKD stage 4, CM (  EF 35-40%), Anemia, hypothyroidism, CVA, apparently c/o fall, unwittnessed. Pt doesn't think that there was syncope but can't rule it out. Pt was brought to ED by family and found to have right femur fracture. He underwent IM nailing 12-11-14.ailing 12-11-14. Type of Study: Bedside swallow evaluation Previous Swallow Assessment: none in chart Diet Prior to this Study: Regular;Thin liquids Temperature Spikes Noted: No Respiratory Status: Room air Behavior/Cognition: Alert;Cooperative;Pleasant mood;Requires cueing;Confused Oral Cavity - Dentition: Adequate natural dentition Self-Feeding Abilities: Able to feed self Patient Positioning: Upright in  bed Baseline Vocal Quality: Wet Volitional Cough: Strong Volitional Swallow: Able to elicit    Oral/Motor/Sensory Function Overall Oral Motor/Sensory Function: Appears within functional limits for tasks assessed   Ice Chips Ice chips: Not tested   Thin Liquid Thin Liquid: Impaired Presentation: Cup;Self Fed Pharyngeal  Phase Impairments: Multiple swallows;Wet Vocal Quality;Cough - Immediate    Nectar Thick Nectar Thick Liquid: Impaired Presentation: Cup;Self Fed;Spoon Pharyngeal Phase Impairments: Multiple swallows;Wet Vocal Quality;Throat Clearing - Delayed   Honey Thick Honey Thick Liquid: Impaired Presentation: Spoon Pharyngeal Phase Impairments: Multiple swallows;Throat Clearing - Delayed   Puree Puree: Impaired Presentation: Self Fed;Spoon Pharyngeal Phase Impairments: Multiple swallows;Throat Clearing - Delayed   Solid    Solid: Not tested      Germain Osgood, M.A. CCC-SLP [phone]  Germain Osgood 12/14/2014,12:34 PM

## 2014-12-15 LAB — CBC
HCT: 28.4 % — ABNORMAL LOW (ref 39.0–52.0)
HEMOGLOBIN: 9.2 g/dL — AB (ref 13.0–17.0)
MCH: 29.8 pg (ref 26.0–34.0)
MCHC: 32.4 g/dL (ref 30.0–36.0)
MCV: 91.9 fL (ref 78.0–100.0)
Platelets: 97 10*3/uL — ABNORMAL LOW (ref 150–400)
RBC: 3.09 MIL/uL — AB (ref 4.22–5.81)
RDW: 15.5 % (ref 11.5–15.5)
WBC: 6.1 10*3/uL (ref 4.0–10.5)

## 2014-12-15 LAB — PROTIME-INR
INR: 1.85 — ABNORMAL HIGH (ref 0.00–1.49)
Prothrombin Time: 21.5 seconds — ABNORMAL HIGH (ref 11.6–15.2)

## 2014-12-15 LAB — GLUCOSE, CAPILLARY: Glucose-Capillary: 135 mg/dL — ABNORMAL HIGH (ref 70–99)

## 2014-12-15 MED ORDER — TRAMADOL HCL 50 MG PO TABS: 25.0000 mg | ORAL_TABLET | Freq: Two times a day (BID) | ORAL | Status: AC | PRN

## 2014-12-15 MED ORDER — WARFARIN SODIUM 5 MG PO TABS: 2.5000 mg | ORAL_TABLET | Freq: Once | ORAL | Status: DC

## 2014-12-15 MED ORDER — POLYETHYLENE GLYCOL 3350 17 G PO PACK: 17.0000 g | PACK | Freq: Every day | ORAL | Status: AC

## 2014-12-15 MED ORDER — ACETAMINOPHEN 500 MG PO TABS: 1000.0000 mg | ORAL_TABLET | Freq: Three times a day (TID) | ORAL | Status: AC

## 2014-12-15 NOTE — Discharge Summary (Signed)
PATIENT DETAILS Name: [name] Amos Sr. Age: 81 y.o. Sex: male Date of Birth: [birth year] MRN: [id number]. Admitting Physician: Jani Gravel, MD WUJ:[id number] Ronnald Ramp, MD  Admit Date: 12/10/2014 Discharge date: 12/15/2014  Recommendations for Outpatient Follow-up:  1. Please check CBC and chemistries in 1 week 2. On Coumadin-INR 1.85 on discharge, please continue to check Coumadin frequently if not daily, goal INR is between 2 and 3. Please continue to adjust Coumadin dose depending on INR results. 3. Dysphagia 1 diet with honey thick liquids 4. Speech therapy follow-up was at SNF  PRIMARY DISCHARGE DIAGNOSIS:  Active Problems:   Deficiency anemia   Essential hypertension   Hypothyroidism   CKD (chronic kidney disease), stage IV   Femur fracture   Closed displaced spiral fracture of shaft of right femur   Elevated troponin   Acute renal failure syndrome   Postoperative anemia due to acute blood loss   Chronic systolic heart failure   Chronic atrial fibrillation   Thrombocytopenia      PAST MEDICAL HISTORY: Past Medical History  Diagnosis Date  . Coronary artery disease     a.  s/p MI;  b. s/p CABG 2007;  c. cath 12/08: LM occluded, pRCA 80%, dRCA 50%, PLB 90%, S-PDA ok, S-Dx ok with occluded dist Dx; L-LAD ok, S-OM ok with 90% after graft insertion . . . . distal OM treated with DES  . Ischemic cardiomyopathy     echo 6/12 EF 35-40%, apical, dAnt, mid to dist inf-lat HK, mild AI, mod MR, mod BAE, PASP 44  . Chronic combined systolic and diastolic heart failure     admx 8/13;   . Hypertension   . Atrial fibrillation     a. on coumadin.  . CKD (chronic kidney disease), stage IV   . CVA (cerebral infarction)     left frontal  . Anemia     chronic  . Hypothyroidism   . Leg muscle spasm   . GERD (gastroesophageal reflux disease)   . Skin cancer     DISCHARGE MEDICATIONS: Current Discharge Medication List    START taking these medications   Details  acetaminophen  (TYLENOL) 500 MG tablet Take 2 tablets (1,000 mg total) by mouth every 8 (eight) hours. For 5 more days from 12/15/14 and then discontinue and change to when necessary Qty: 30 tablet, Refills: 0    polyethylene glycol (MIRALAX / GLYCOLAX) packet Take 17 g by mouth daily. Qty: 14 each, Refills: 0    traMADol (ULTRAM) 50 MG tablet Take 0.5 tablets (25 mg total) by mouth every 12 (twelve) hours as needed for moderate pain. Qty: 30 tablet, Refills: 0      CONTINUE these medications which have NOT CHANGED   Details  carvedilol (COREG) 3.125 MG tablet Take 1 tablet (3.125 mg total) by mouth 2 (two) times daily with a meal. Qty: 60 tablet, Refills: 5    !! Dietary Management Product (VASCULERA PO) Take 1 tablet by mouth daily. 630 mg tablet    fenofibrate 54 MG tablet TAKE 1 TABLET BY MOUTH DAILY Qty: 30 tablet, Refills: 5    furosemide (LASIX) 20 MG tablet TAKE 2 TABLETS BY MOUTH DAILY Qty: 60 tablet, Refills: 3    levothyroxine (SYNTHROID, LEVOTHROID) 25 MCG tablet Take 25 mcg by mouth daily before breakfast.    nitroGLYCERIN (NITROSTAT) 0.4 MG SL tablet Place 1 tablet (0.4 mg total) under the tongue every 5 (five) minutes as needed for chest pain. Qty: 25 tablet, Refills: 1  RAPAFLO 8 MG CAPS capsule TAKE 1 CAPLET BY MOUTH DAILY WITH BREAKFAST. Qty: 90 capsule, Refills: 3    warfarin (COUMADIN) 2.5 MG tablet Take 1 to 1.5 tablets by mouth daily as directed by coumadin clinic Qty: 40 tablet, Refills: 5    !! Dietary Management Product (VASCULERA) TABS TAKE 1 TABLET BY MOUTH DAILY Qty: 90 tablet, Refills: 3     !! - Potential duplicate medications found. Please discuss with provider.    STOP taking these medications     neomycin-bacitracin-polymyxin (NEOSPORIN) ointment         ALLERGIES:   Allergies  Allergen Reactions  . Ace Inhibitors     Cannot remember the reaction    BRIEF HPI:  See H&P, Labs, Consult and Test reports for all details in brief, patient is a 81  yo male with hx of CAD , CKD stage 4, CM (EF 35-40%), Anemia, hypothyroidism, CVA who presented with a mechanical fall, patient was found to have a right hip fracture on further evaluation. Patient was subsequently admitted.  CONSULTATIONS:   cardiology and orthopedic surgery  PERTINENT RADIOLOGIC STUDIES: Dg Pelvis 1-2 Views  12/10/2014   CLINICAL DATA:  [place] today.  Right leg pain.  EXAM: RIGHT FEMUR 2 VIEWS; PELVIS - 1-2 VIEW  COMPARISON:  None.  FINDINGS: There is a comminuted displaced subtrochanteric fracture of the femur. No definite pelvic fractures. Both hips are normally located.  IMPRESSION: Displaced subtrochanteric fracture of the right femur.   Electronically Signed   By: Marijo Sanes M.D.   On: 12/10/2014 21:35   Ct Head Wo Contrast  12/10/2014   CLINICAL DATA:  Found on the floor after falling at home.  EXAM: CT HEAD WITHOUT CONTRAST  CT CERVICAL SPINE WITHOUT CONTRAST  TECHNIQUE: Multidetector CT imaging of the head and cervical spine was performed following the standard protocol without intravenous contrast. Multiplanar CT image reconstructions of the cervical spine were also generated.  COMPARISON:  07/14/2014  FINDINGS: CT HEAD FINDINGS  There is generalized brain atrophy. There is chronic small vessel disease throughout the deep white matter. 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 There is chronic temporal and occipital atrophy with prominence of the ventricles. No sign of acute infarction, mass lesion, hemorrhage, obstructive hydrocephalus or extra-axial collection. No skull fracture. No traumatic fluid in the sinuses, middle ears or mastoids. Chronic sclerosis in the right mastoid region.  CT CERVICAL SPINE FINDINGS  No cervical fracture. No traumatic malalignment. No soft tissue swelling.  There are prominent bridging osteophytes consistent with spondylosis 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 There is ordinary  facet osteoarthritis.  IMPRESSION: Head CT: Atrophy and chronic ischemic changes. No acute or traumatic finding.  Cervical spine CT: Chronic spondylosis and ankylosis. No acute or traumatic finding.   Electronically Signed   By: Nelson Chimes M.D.   On: 12/10/2014 21:57   Ct Cervical Spine Wo Contrast  12/10/2014   CLINICAL DATA:  Found on the floor after falling at home.  EXAM: CT HEAD WITHOUT CONTRAST  CT CERVICAL SPINE WITHOUT CONTRAST  TECHNIQUE: Multidetector CT imaging of the head and cervical spine was performed following the standard protocol without intravenous contrast. Multiplanar CT image reconstructions of the cervical spine were also generated.  COMPARISON:  07/14/2014  FINDINGS: CT HEAD FINDINGS  There is generalized brain atrophy. There is chronic small vessel disease throughout the deep white matter. There is old bifrontal cortical and subcortical infarction, left worse than  right. There is chronic temporal and occipital atrophy with prominence of the ventricles. No sign of acute infarction, mass lesion, hemorrhage, obstructive hydrocephalus or extra-axial collection. No skull fracture. No traumatic fluid in the sinuses, middle ears or mastoids. Chronic sclerosis in the right mastoid region.  CT CERVICAL SPINE FINDINGS  No cervical fracture. No traumatic malalignment. No soft tissue swelling.  There are prominent bridging osteophytes consistent with spondylosis 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 There is ordinary facet osteoarthritis.  IMPRESSION: Head CT: Atrophy and chronic ischemic changes. No acute or traumatic finding.  Cervical spine CT: Chronic spondylosis and ankylosis. No acute or traumatic finding.   Electronically Signed   By: Nelson Chimes M.D.   On: 12/10/2014 21:57   Dg Chest Port 1 View  12/10/2014   CLINICAL DATA:  Preop femur fracture repair.  EXAM: PORTABLE CHEST - 1 VIEW  COMPARISON:  07/14/2014  FINDINGS: Patient is post median sternotomy.  Cardiomediastinal contours are unchanged with mild cardiomegaly, heart size likely accentuated by technique. Lungs remain hyperinflated. No pulmonary edema, consolidation, pleural effusion or pneumothorax. The bones are diffusely under mineralized.  IMPRESSION: Stable chronic change without acute process.   Electronically Signed   By: Jeb Levering M.D.   On: 12/10/2014 23:45   Dg Swallowing Func-speech Pathology  12/14/2014    Objective Swallowing Evaluation:    Patient Details  Name: [name] ZAWADZKI Sr. MRN: [id number] Date of Birth: 1919/12/10  Today's Date: 12/14/2014 Time: SLP Start Time (ACUTE ONLY): 1206-SLP Stop Time (ACUTE ONLY): 1217 SLP Time Calculation (min) (ACUTE ONLY): 11 min  Past Medical History:  Past Medical History  Diagnosis Date  . Coronary artery disease     a.  s/p MI;  b. s/p CABG 2007;  c. cath 12/08: LM occluded, pRCA 80%,  dRCA 50%, PLB 90%, S-PDA ok, S-Dx ok with occluded dist Dx; L-LAD ok, S-OM  ok with 90% after graft insertion . . . . distal OM treated with DES  . Ischemic cardiomyopathy     echo 6/12 EF 35-40%, apical, dAnt, mid to dist inf-lat HK, mild AI, mod  MR, mod BAE, PASP 44  . Chronic combined systolic and diastolic heart failure     admx 8/13;   . Hypertension   . Atrial fibrillation     a. on coumadin.  . CKD (chronic kidney disease), stage IV   . CVA (cerebral infarction)     left frontal  . Anemia     chronic  . Hypothyroidism   . Leg muscle spasm   . GERD (gastroesophageal reflux disease)   . Skin cancer    Past Surgical History:  Past Surgical History  Procedure Laterality Date  . Coronary artery bypass graft  2007  . Coronary angioplasty with stent placement    . Femur im nail Right 12/11/2014    Procedure: INTRAMEDULLARY (IM) NAIL FEMORAL;  Surgeon: Mcarthur Rossetti, MD;  Location: [facility];  Service: Orthopedics;  Laterality: Right;    HPI:  HPI: 81 yo male with hx of CAD , CKD stage 4, CM (EF 35-40%), Anemia,  hypothyroidism, CVA, apparently c/o fall,  unwittnessed. Pt doesn't think  that there was syncope but can't rule it out. Pt was brought to ED by  family and found to have right femur fracture. He underwent IM n85 yo  male with hx of CAD , CKD stage 4, CM (EF 35-40%), Anemia, hypothyroidism,  CVA, apparently c/o fall, unwittnessed. Pt doesn't think that  there was  syncope but can't rule it out. Pt was brought to ED by family and found  to have right femur fracture. He underwent IM nailing 12-11-14.ailing  12-11-14.  No Data Recorded  Assessment / Plan / Recommendation CHL IP CLINICAL IMPRESSIONS 12/14/2014  Dysphagia Diagnosis Moderate pharyngeal phase dysphagia;Moderate cervical  esophageal phase dysphagia;Severe cervical esophageal phase dysphagia   Clinical impression Pt has a moderate-severe pharyngeal and cervical  esophageal dysphagia that is structurally and motorically based. Suspected  osteophytes that begin at C3 and extend through C6 (MD not present to  confirm) limits epiglottic deflection, impedes pharyngeal transit, and  reduces UES opening. Multiple reflexive swallows facilitate pharyngeal  clearance, although penetration occurs after the swallow with all  consistencies tested due to moderate amounts of residue. Aspiration was  observed x1 with nectar thick liquids, which otherwise penetrate to the  vocal cords. Penetration is trace and shallow with honey thick liquids and  pureed solids. Pt's sensation is good, with reflexive throat clearing and  coughing effective at expelling penetrates from the laryngeal vestibule.   Recommend Dys 1 diet and honey thick liquids. Suspect that chronic,  structural element of dysphagia is currently exacerbated due to decreased  functional reserve during acute admission. Will continue to follow and  advance as able - pt will benefit from SLP services at SNF to maximize  safety and recovery with swallowing.      CHL IP TREATMENT RECOMMENDATION 12/14/2014  Treatment Plan Recommendations Therapy as outlined in  treatment plan below      CHL IP DIET RECOMMENDATION 12/14/2014  Diet Recommendations Dysphagia 1 (Puree);Honey-thick liquid  Liquid Administration via Cup;No straw  Medication Administration Crushed with puree  Compensations Slow rate;Small sips/bites;Multiple dry swallows after each  bite/sip;Clear throat intermittently  Postural Changes and/or Swallow Maneuvers Seated upright 90  degrees;Upright 30-60 min after meal     CHL IP OTHER RECOMMENDATIONS 12/14/2014  Recommended Consults (None)  Oral Care Recommendations Oral care BID  Other Recommendations Order thickener from pharmacy;Prohibited food  (jello, ice cream, thin soups);Remove water pitcher     CHL IP FOLLOW UP RECOMMENDATIONS 12/14/2014  Follow up Recommendations Skilled Nursing facility;24 hour  supervision/assistance     CHL IP FREQUENCY AND DURATION 12/14/2014  Speech Therapy Frequency (ACUTE ONLY) min 2x/week  Treatment Duration 2 weeks     Pertinent Vitals/Pain: n/a     SLP Swallow Goals  CHL IP REASON FOR REFERRAL 12/14/2014  Reason for Referral Objectively evaluate swallowing function     CHL IP ORAL PHASE 12/14/2014  Lips (None)  Tongue (None)  Mucous membranes (None)  Nutritional status (None)  Other (None)  Oxygen therapy (None)  Oral Phase WFL  Oral - Pudding Teaspoon (None)  Oral - Pudding Cup (None)  Oral - Honey Teaspoon (None)  Oral - Honey Cup (None)  Oral - Honey Syringe (None)  Oral - Nectar Teaspoon (None)  Oral - Nectar Cup (None)  Oral - Nectar Straw (None)  Oral - Nectar Syringe (None)  Oral - Ice Chips (None)  Oral - Thin Teaspoon (None)  Oral - Thin Cup (None)  Oral - Thin Straw (None)  Oral - Thin Syringe (None)  Oral - Puree (None)  Oral - Mechanical Soft (None)  Oral - Regular (None)  Oral - Multi-consistency (None)  Oral - Pill (None)  Oral Phase - Comment (None)      CHL IP PHARYNGEAL PHASE 12/14/2014  Pharyngeal Phase Impaired  Pharyngeal - Pudding Teaspoon (None)  Penetration/Aspiration details (pudding  teaspoon) (None)  Pharyngeal  - Pudding Cup (None)  Penetration/Aspiration details (pudding cup) (None)  Pharyngeal - Honey Teaspoon (None)  Penetration/Aspiration details (honey teaspoon) (None)  Pharyngeal - Honey Cup Delayed swallow initiation;Reduced pharyngeal  peristalsis;Reduced epiglottic inversion;Penetration/Aspiration after  swallow;Pharyngeal residue - pyriform sinuses;Pharyngeal residue - cp  segment  Penetration/Aspiration details (honey cup) Material enters airway, remains  ABOVE vocal cords and not ejected out  Pharyngeal - Honey Syringe (None)  Penetration/Aspiration details (honey syringe) (None)  Pharyngeal - Nectar Teaspoon Delayed swallow initiation;Reduced pharyngeal  peristalsis;Reduced epiglottic inversion;Penetration/Aspiration after  swallow;Pharyngeal residue - pyriform sinuses;Pharyngeal residue - cp  segment  Penetration/Aspiration details (nectar teaspoon) Material enters airway,  CONTACTS cords then ejected out  Pharyngeal - Nectar Cup Delayed swallow initiation;Reduced pharyngeal  peristalsis;Reduced epiglottic inversion;Penetration/Aspiration after  swallow;Pharyngeal residue - pyriform sinuses;Pharyngeal residue - cp  segment  Penetration/Aspiration details (nectar cup) Material enters airway, passes  BELOW cords then ejected out  Pharyngeal - Nectar Straw (None)  Penetration/Aspiration details (nectar straw) (None)  Pharyngeal - Nectar Syringe (None)  Penetration/Aspiration details (nectar syringe) (None)  Pharyngeal - Ice Chips (None)  Penetration/Aspiration details (ice chips) (None)  Pharyngeal - Thin Teaspoon (None)  Penetration/Aspiration details (thin teaspoon) (None)  Pharyngeal - Thin Cup (None)  Penetration/Aspiration details (thin cup) (None)  Pharyngeal - Thin Straw (None)  Penetration/Aspiration details (thin straw) (None)  Pharyngeal - Thin Syringe (None)  Penetration/Aspiration details (thin syringe') (None)  Pharyngeal - Puree Delayed swallow initiation;Reduced pharyngeal  peristalsis;Reduced  epiglottic inversion;Pharyngeal residue - pyriform  sinuses;Pharyngeal residue - cp segment;Penetration/Aspiration after  swallow  Penetration/Aspiration details (puree) Material enters airway, remains  ABOVE vocal cords and not ejected out  Pharyngeal - Mechanical Soft (None)  Penetration/Aspiration details (mechanical soft) (None)  Pharyngeal - Regular (None)  Penetration/Aspiration details (regular) (None)  Pharyngeal - Multi-consistency (None)  Penetration/Aspiration details (multi-consistency) (None)  Pharyngeal - Pill (None)  Penetration/Aspiration details (pill) (None)  Pharyngeal Comment (None)     CHL IP CERVICAL ESOPHAGEAL PHASE 12/14/2014  Cervical Esophageal Phase Impaired  Pudding Teaspoon (None)  Pudding Cup (None)  Honey Teaspoon (None)  Honey Cup Reduced cricopharyngeal relaxation  Honey Syringe (None)  Nectar Teaspoon Reduced cricopharyngeal relaxation  Nectar Cup Reduced cricopharyngeal relaxation  Nectar Straw (None)  Nectar Syringe (None)  Thin Teaspoon (None)  Thin Cup (None)  Thin Straw (None)  Thin Syringe (None)  Cervical Esophageal Comment (None)         Germain Osgood, M.A. CCC-SLP [phone]  Germain Osgood 12/14/2014, 1:35 PM    Dg C-arm 61-120 Min  12/11/2014   CLINICAL DATA:  Hardware fixation of a comminuted right subtrochanteric femoral fracture.  EXAM: RIGHT FEMUR 2 VIEWS; DG C-ARM 61-120 MIN  COMPARISON:  Right femur radiographs obtained yesterday.  FINDINGS: Six C arm views of the right femur demonstrate interval placement of an intramedullary rod and screws bridging the previously seen proximal right femur fracture. Improved position and alignment of the fragments with mild residual posterior displacement of the distal fragment.  IMPRESSION: Hardware fixation of the previously demonstrated proximal femur fracture.   Electronically Signed   By: Claudie Revering M.D.   On: 12/11/2014 15:07   Dg Femur, Min 2 Views Right  12/11/2014   CLINICAL DATA:  Hardware fixation of a  comminuted right subtrochanteric femoral fracture.  EXAM: RIGHT FEMUR 2 VIEWS; DG C-ARM 61-120 MIN  COMPARISON:  Right femur radiographs obtained yesterday.  FINDINGS: Six C arm views of the right femur demonstrate interval placement of an intramedullary rod and  screws bridging the previously seen proximal right femur fracture. Improved position and alignment of the fragments with mild residual posterior displacement of the distal fragment.  IMPRESSION: Hardware fixation of the previously demonstrated proximal femur fracture.   Electronically Signed   By: Claudie Revering M.D.   On: 12/11/2014 15:07   Dg Femur, Min 2 Views Right  12/10/2014   CLINICAL DATA:  [place] today.  Right leg pain.  EXAM: RIGHT FEMUR 2 VIEWS; PELVIS - 1-2 VIEW  COMPARISON:  None.  FINDINGS: There is a comminuted displaced subtrochanteric fracture of the femur. No definite pelvic fractures. Both hips are normally located.  IMPRESSION: Displaced subtrochanteric fracture of the right femur.   Electronically Signed   By: Marijo Sanes M.D.   On: 12/10/2014 21:35     PERTINENT LAB RESULTS: CBC:  Recent Labs  12/14/14 0504 12/15/14 0541  WBC 7.0 6.1  HGB 9.1* 9.2*  HCT 27.5* 28.4*  PLT 78* 97*   CMET CMP     Component Value Date/Time   NA 148* 12/14/2014 0504   K 4.0 12/14/2014 0504   CL 114* 12/14/2014 0504   CO2 23 12/14/2014 0504   GLUCOSE 146* 12/14/2014 0504   BUN 67* 12/14/2014 0504   CREATININE 2.92* 12/14/2014 0504   CREATININE 2.46* 07/10/2013 1609   CALCIUM 8.5 12/14/2014 0504   CALCIUM 9.7 12/16/2009 0003   PROT 6.6 12/11/2014 0555   ALBUMIN 3.6 12/11/2014 0555   AST 21 12/11/2014 0555   ALT 13 12/11/2014 0555   ALKPHOS 31* 12/11/2014 0555   BILITOT 1.2 12/11/2014 0555   GFRNONAA 17* 12/14/2014 0504   GFRAA 20* 12/14/2014 0504    GFR Estimated Creatinine Clearance: 16.3 mL/min (by C-G formula based on Cr of 2.92). No results for input(s): LIPASE, AMYLASE in the last 72 hours. No results for  input(s): CKTOTAL, CKMB, CKMBINDEX, TROPONINI in the last 72 hours. Invalid input(s): POCBNP No results for input(s): DDIMER in the last 72 hours. No results for input(s): HGBA1C in the last 72 hours. No results for input(s): CHOL, HDL, LDLCALC, TRIG, CHOLHDL, LDLDIRECT in the last 72 hours. No results for input(s): TSH, T4TOTAL, T3FREE, THYROIDAB in the last 72 hours.  Invalid input(s): FREET3 No results for input(s): VITAMINB12, FOLATE, FERRITIN, TIBC, IRON, RETICCTPCT in the last 72 hours. Coags:  Recent Labs  12/14/14 0504 12/15/14 0541  INR 1.68* 1.85*   Microbiology: Recent Results (from the past 240 hour(s))  Surgical pcr screen     Status: Abnormal   Collection Time: 12/11/14  8:38 AM  Result Value Ref Range Status   MRSA, PCR NEGATIVE NEGATIVE Final   Staphylococcus aureus POSITIVE (A) NEGATIVE Final    Comment:        The Xpert SA Assay (FDA approved for NASAL specimens in patients over 65 years of age), is one component of a comprehensive surveillance program.  Test performance has been validated by [facility] for patients greater than or equal to 40 year old. It is not intended to diagnose infection nor to guide or monitor treatment.      BRIEF HOSPITAL COURSE:  Closed Hip fracture:secondary to a mechanical fall. Seen by cards pre-operatively, then underwent repair of left hip on 4/16. Per op note, 500 cc of blood loss, subsequently transfused 3 units of PRBC. Unfortunately, post operative course has been complicated by development of perioperative blood loss anemia, transient hypotension and worsening renal function. Thank you, with supportive care, patient has now stabilized and is stable for  discharge. Patient will continue Coumadin for DVT prophylaxis, INR is still subtherapeutic at the time of discharge. We will continue to use Tylenol for pain, patient will also be provided with Ultram as needed for breakthrough pain. Patient will need follow-up with Dr.  Valere Dross in the next 3 weeks.  Transient Hypotension:Resolved.Occurred on 4/17 am-suspect secondary to Narcotics, and post operative blood loss. Continue to minimize narcotics as much as possible.  Acute perioperative blood loss anemia: transfused 3 units of PRBC so far. Hb stable. Please check CBC in 1 week to ensure stability.  Acute on chronic kidney disease stage IV: Secondary to acute illness, hypotension, blood loss. Baseline Creatinine around 2.6-2.7, creatinine peaked at 3.07, last creatinine on 4/19 was down to 2.92. Please continue to follow electrolytes closely while at SNF. Lasix has been resumed. Suggest repeat electrolytes in one week   Atrial Fibrillation:continue rate control with Coreg, Coumadin resumed, INR still subtherapeutic. Follow PT/INR while at SNF on a daily basis till INR therapeutic. Please continue to adjust dosing of Coumadin.  Chronic systolic CHF:EF of 16%-10% by Echo in 2012. Remains compensated. Cautiously continue with Coreg , not a candidate for ACEI/ARB given CKD stage 4. Resumed Lasix  Thrombocytopenia:chronic issue, platelet count up to 97,000 time of discharge. Just slightly worse than baseline. Follow. If he was at [facility]  RUE:AVWUJWJ not on ASA as on coumadin. Minimal troponin elevated is likely secondary to CKD  Dyslipidemia:continue Fenofibrate  Hypothyroidism:continue Synthroid  XBJ:YNWGNFAO Rapaflo. Foley catheter was successfully discontinued on 4/19, patient reporting spontaneously.   TODAY-DAY OF DISCHARGE:  Subjective:   Karmello Sweetser today has no headache,no chest abdominal pain,no new weakness tingling or numbness, feels much better wants to go home today.    Objective:   Blood pressure 126/76, pulse 84, temperature 97.7 F (36.5 C), temperature source Axillary, resp. rate 17, weight 71.215 kg (157 lb), SpO2 96 %.  Intake/Output Summary (Last 24 hours) at 12/15/14 1006 Last data filed at 12/15/14 0800  Gross per 24  hour  Intake      3 ml  Output    850 ml  Net   -847 ml   Filed Weights   12/13/14 0500 12/14/14 0604 12/15/14 0503  Weight: 76.2 kg (167 lb 15.9 oz) 74.39 kg (164 lb) 71.215 kg (157 lb)    Exam Awake Alert, Oriented *3, No new F.N deficits, Normal affect [place].AT,PERRAL Supple Neck,No JVD, No cervical lymphadenopathy appriciated.  Symmetrical Chest wall movement, Good air movement bilaterally, CTAB RRR,No Gallops,Rubs or new Murmurs, No Parasternal Heave +ve B.Sounds, Abd Soft, Non tender, No organomegaly appriciated, No rebound -guarding or rigidity. No Cyanosis, Clubbing or edema, No new Rash or bruise  DISCHARGE CONDITION: Stable  DISPOSITION: SNF  DISCHARGE INSTRUCTIONS:    Activity:  As tolerated with Full fall precautions use walker/cane & assistance as needed. Partial weight bearing to the right lower extremity  Diet recommendation: Heart Healthy diet Dysphagia 1 diet with honey thick liquids. Needs. Revision, meds crushed in pure, multiple swallows after each bite/sip Fluid restriction 1.5 lit/day Aspiration precautions:yes  Discharge Instructions    (HEART FAILURE PATIENTS) Call MD:  Anytime you have any of the following symptoms: 1) 3 pound weight gain in 24 hours or 5 pounds in 1 week 2) shortness of breath, with or without a dry hacking cough 3) swelling in the hands, feet or stomach 4) if you have to sleep on extra pillows at night in order to breathe.    Complete by:  As directed  Call MD for:  redness, tenderness, or signs of infection (pain, swelling, redness, odor or green/yellow discharge around incision site)    Complete by:  As directed      Diet - low sodium heart healthy    Complete by:  As directed   Dysphagia 1 diet with honey thick liquids. Full aspiration precautions at all times, feeding with supervision only     Increase activity slowly    Complete by:  As directed   50%/partial weight bearing to right leg.     Partial weight bearing     Complete by:  As directed   % Body Weight:  50%  Laterality:  right  Extremity:  Lower           Follow-up Information    Follow up with Mcarthur Rossetti, MD. Schedule an appointment as soon as possible for a visit in 2 weeks.   Specialty:  Orthopedic Surgery   Contact information:   [place] [address] [phone]       Follow up with Scarlette Calico, MD. Schedule an appointment as soon as possible for a visit in 1 week.   Specialty:  Internal Medicine   Contact information:   [address] [place] [address] [phone]       Total Time spent on discharge equals 45 minutes.  SignedOren Binet 12/15/2014 10:06 AM

## 2014-12-15 NOTE — Progress Notes (Signed)
Attempted to call report to [facility]. Placed on hold for over 5 minutes. Will attempt to call one more time prior to patients discharge. Patient awaiting [facility] for transport. Family at bedside and aware of discharge plan.

## 2014-12-15 NOTE — Progress Notes (Signed)
Subjective: 4 Days Post-Op Procedure(s) (LRB): INTRAMEDULLARY (IM) NAIL FEMORAL (Right) Patient reports pain as moderate.  Much improved mental status.  H/H up with transfusion.  Family at bedside.  Objective: Vital signs in last 24 hours: Temp:  [97.7 F (36.5 C)-98.7 F (37.1 C)] 97.7 F (36.5 C) (04/20 0503) Pulse Rate:  [73-84] 84 (04/20 0503) Resp:  [16-17] 17 (04/20 0503) BP: (109-126)/(49-76) 126/76 mmHg (04/20 0503) SpO2:  [96 %-99 %] 96 % (04/20 0503) Weight:  [71.215 kg (157 lb)] 71.215 kg (157 lb) (04/20 0503)  Intake/Output from previous day: 04/19 0701 - 04/20 0700 In: 23 [P.O.:20; I.V.:3] Out: 975 [Urine:975] Intake/Output this shift:     Recent Labs  12/13/14 0539 12/14/14 0504 12/15/14 0541  HGB 7.8* 9.1* 9.2*    Recent Labs  12/14/14 0504 12/15/14 0541  WBC 7.0 6.1  RBC 3.05* 3.09*  HCT 27.5* 28.4*  PLT 78* 97*    Recent Labs  12/13/14 0539 12/14/14 0504  NA 143 148*  K 3.9 4.0  CL 112 114*  CO2 21 23  BUN 66* 67*  CREATININE 3.07* 2.92*  GLUCOSE 141* 146*  CALCIUM 8.0* 8.5    Recent Labs  12/14/14 0504 12/15/14 0541  INR 1.68* 1.85*    Sensation intact distally Intact pulses distally Dorsiflexion/Plantar flexion intact Incision: no drainage No cellulitis present Compartment soft  Assessment/Plan: 4 Days Post-Op Procedure(s) (LRB): INTRAMEDULLARY (IM) NAIL FEMORAL (Right) Up with therapy Discharge to SNF  - ok to discharge from ortho standpoint  Leeman Johnsey Y 12/15/2014, 7:33 AM

## 2014-12-15 NOTE — Progress Notes (Signed)
ANTICOAGULATION CONSULT NOTE - FOLLOW UP  Pharmacy Consult:  Coumadin  Indication: VTE prophylaxis  Allergies  Allergen Reactions  . Ace Inhibitors     Cannot remember the reaction    Patient Measurements: Weight: 157 lb (71.215 kg)  Vital Signs: Temp: 97.7 F (36.5 C) (04/20 0503) Temp Source: Axillary (04/20 0503) BP: 113/51 mmHg (04/20 1006) Pulse Rate: 94 (04/20 1006)  Labs:  Recent Labs  12/13/14 0539 12/14/14 0504 12/15/14 0541  HGB 7.8* 9.1* 9.2*  HCT 24.0* 27.5* 28.4*  PLT 68* 78* 97*  LABPROT 19.5* 19.9* 21.5*  INR 1.63* 1.68* 1.85*  CREATININE 3.07* 2.92*  --     Estimated Creatinine Clearance: 16.3 mL/min (by C-G formula based on Cr of 2.92).    Assessment: 41 YOM with history of Afib on Coumadin PTA.  Patient was admitted for a femur fracture so Coumadin was reversed and is now s/p repair on 4/16.  Coumadin was resumed post-op.  INR remains sub-therapeutic but is trending up, 1.85 today. H/H and Plt now starting to trend back up. Likely d/c to SNF soon.  PTA dose: 2.5mg  daily except 3.75 mg on Monday and Thursdays. Per Coumadin clinic note on 4/12    Goal of Therapy:  INR 2-3    Plan:  - Coumadin 2.5mg  PO today. - Recommend resume PTA dose as above if discharge. - Daily PT / INR - Monitor for bleeding  Maryanna Shape, PharmD, BCPS  Clinical Pharmacist  Pager: [phone]

## 2014-12-15 NOTE — Discharge Planning (Signed)
Patient to be discharged to [facility]. Patient's wife, daughter, and son-in-law updated at bedside.  [facility] Medicare: [id number]  Facility: [facility] Report number: [phone] Transportation: EMS ([address])  [place], [place] Cell: [phone]       Fax: [phone] Clinical Social Work: Orthopedics [phone]) and Surgical [phone])

## 2014-12-16 ENCOUNTER — Telehealth: Payer: Self-pay | Admitting: *Deleted

## 2014-12-16 NOTE — Telephone Encounter (Signed)
Called pt concerning tcm appt no answer LMOM RTC.../lmb 

## 2014-12-17 NOTE — Telephone Encounter (Signed)
Called pt/daughter again still no answer LMOM RTC../lmb 

## 2014-12-18 ENCOUNTER — Inpatient Hospital Stay (HOSPITAL_COMMUNITY)
Admission: EM | Admit: 2014-12-18 | Discharge: 2014-12-26 | DRG: 871 | Disposition: E | Payer: Medicare Other | Attending: Internal Medicine | Admitting: Internal Medicine

## 2014-12-18 DIAGNOSIS — E039 Hypothyroidism, unspecified: Secondary | ICD-10-CM | POA: Diagnosis present

## 2014-12-18 DIAGNOSIS — R05 Cough: Secondary | ICD-10-CM | POA: Diagnosis not present

## 2014-12-18 DIAGNOSIS — D649 Anemia, unspecified: Secondary | ICD-10-CM | POA: Diagnosis present

## 2014-12-18 DIAGNOSIS — Z85828 Personal history of other malignant neoplasm of skin: Secondary | ICD-10-CM

## 2014-12-18 DIAGNOSIS — R451 Restlessness and agitation: Secondary | ICD-10-CM | POA: Diagnosis present

## 2014-12-18 DIAGNOSIS — Z823 Family history of stroke: Secondary | ICD-10-CM

## 2014-12-18 DIAGNOSIS — Z951 Presence of aortocoronary bypass graft: Secondary | ICD-10-CM

## 2014-12-18 DIAGNOSIS — K219 Gastro-esophageal reflux disease without esophagitis: Secondary | ICD-10-CM | POA: Diagnosis present

## 2014-12-18 DIAGNOSIS — D696 Thrombocytopenia, unspecified: Secondary | ICD-10-CM | POA: Diagnosis present

## 2014-12-18 DIAGNOSIS — A419 Sepsis, unspecified organism: Secondary | ICD-10-CM | POA: Diagnosis not present

## 2014-12-18 DIAGNOSIS — I4891 Unspecified atrial fibrillation: Secondary | ICD-10-CM | POA: Diagnosis present

## 2014-12-18 DIAGNOSIS — I5042 Chronic combined systolic (congestive) and diastolic (congestive) heart failure: Secondary | ICD-10-CM | POA: Diagnosis present

## 2014-12-18 DIAGNOSIS — Z8673 Personal history of transient ischemic attack (TIA), and cerebral infarction without residual deficits: Secondary | ICD-10-CM

## 2014-12-18 DIAGNOSIS — Z7901 Long term (current) use of anticoagulants: Secondary | ICD-10-CM

## 2014-12-18 DIAGNOSIS — I129 Hypertensive chronic kidney disease with stage 1 through stage 4 chronic kidney disease, or unspecified chronic kidney disease: Secondary | ICD-10-CM | POA: Diagnosis present

## 2014-12-18 DIAGNOSIS — R269 Unspecified abnormalities of gait and mobility: Secondary | ICD-10-CM | POA: Diagnosis present

## 2014-12-18 DIAGNOSIS — Y95 Nosocomial condition: Secondary | ICD-10-CM | POA: Diagnosis present

## 2014-12-18 DIAGNOSIS — I255 Ischemic cardiomyopathy: Secondary | ICD-10-CM | POA: Diagnosis present

## 2014-12-18 DIAGNOSIS — J189 Pneumonia, unspecified organism: Secondary | ICD-10-CM

## 2014-12-18 DIAGNOSIS — Z66 Do not resuscitate: Secondary | ICD-10-CM | POA: Diagnosis present

## 2014-12-18 DIAGNOSIS — N184 Chronic kidney disease, stage 4 (severe): Secondary | ICD-10-CM | POA: Diagnosis present

## 2014-12-18 DIAGNOSIS — G4730 Sleep apnea, unspecified: Secondary | ICD-10-CM | POA: Diagnosis present

## 2014-12-18 DIAGNOSIS — I10 Essential (primary) hypertension: Secondary | ICD-10-CM | POA: Diagnosis present

## 2014-12-18 DIAGNOSIS — G92 Toxic encephalopathy: Secondary | ICD-10-CM | POA: Diagnosis present

## 2014-12-18 DIAGNOSIS — I2510 Atherosclerotic heart disease of native coronary artery without angina pectoris: Secondary | ICD-10-CM | POA: Diagnosis present

## 2014-12-18 DIAGNOSIS — R1310 Dysphagia, unspecified: Secondary | ICD-10-CM | POA: Diagnosis present

## 2014-12-18 DIAGNOSIS — Z515 Encounter for palliative care: Secondary | ICD-10-CM

## 2014-12-18 DIAGNOSIS — Z955 Presence of coronary angioplasty implant and graft: Secondary | ICD-10-CM

## 2014-12-18 DIAGNOSIS — I482 Chronic atrial fibrillation, unspecified: Secondary | ICD-10-CM | POA: Diagnosis present

## 2014-12-18 DIAGNOSIS — J690 Pneumonitis due to inhalation of food and vomit: Secondary | ICD-10-CM | POA: Diagnosis present

## 2014-12-18 DIAGNOSIS — N179 Acute kidney failure, unspecified: Secondary | ICD-10-CM

## 2014-12-19 ENCOUNTER — Encounter (HOSPITAL_COMMUNITY): Payer: Self-pay

## 2014-12-19 ENCOUNTER — Emergency Department (HOSPITAL_COMMUNITY): Payer: Medicare Other

## 2014-12-19 ENCOUNTER — Inpatient Hospital Stay (HOSPITAL_COMMUNITY): Payer: Medicare Other

## 2014-12-19 DIAGNOSIS — N179 Acute kidney failure, unspecified: Secondary | ICD-10-CM | POA: Diagnosis present

## 2014-12-19 DIAGNOSIS — I481 Persistent atrial fibrillation: Secondary | ICD-10-CM

## 2014-12-19 DIAGNOSIS — I482 Chronic atrial fibrillation: Secondary | ICD-10-CM | POA: Diagnosis present

## 2014-12-19 DIAGNOSIS — G4730 Sleep apnea, unspecified: Secondary | ICD-10-CM | POA: Diagnosis present

## 2014-12-19 DIAGNOSIS — R05 Cough: Secondary | ICD-10-CM | POA: Diagnosis present

## 2014-12-19 DIAGNOSIS — D696 Thrombocytopenia, unspecified: Secondary | ICD-10-CM

## 2014-12-19 DIAGNOSIS — J189 Pneumonia, unspecified organism: Secondary | ICD-10-CM | POA: Diagnosis not present

## 2014-12-19 DIAGNOSIS — Z85828 Personal history of other malignant neoplasm of skin: Secondary | ICD-10-CM | POA: Diagnosis not present

## 2014-12-19 DIAGNOSIS — E039 Hypothyroidism, unspecified: Secondary | ICD-10-CM

## 2014-12-19 DIAGNOSIS — Z823 Family history of stroke: Secondary | ICD-10-CM | POA: Diagnosis not present

## 2014-12-19 DIAGNOSIS — I10 Essential (primary) hypertension: Secondary | ICD-10-CM

## 2014-12-19 DIAGNOSIS — Z951 Presence of aortocoronary bypass graft: Secondary | ICD-10-CM | POA: Diagnosis not present

## 2014-12-19 DIAGNOSIS — N184 Chronic kidney disease, stage 4 (severe): Secondary | ICD-10-CM

## 2014-12-19 DIAGNOSIS — A419 Sepsis, unspecified organism: Secondary | ICD-10-CM | POA: Diagnosis present

## 2014-12-19 DIAGNOSIS — R1310 Dysphagia, unspecified: Secondary | ICD-10-CM | POA: Diagnosis present

## 2014-12-19 DIAGNOSIS — G92 Toxic encephalopathy: Secondary | ICD-10-CM | POA: Diagnosis present

## 2014-12-19 DIAGNOSIS — Z515 Encounter for palliative care: Secondary | ICD-10-CM | POA: Diagnosis not present

## 2014-12-19 DIAGNOSIS — I255 Ischemic cardiomyopathy: Secondary | ICD-10-CM | POA: Diagnosis present

## 2014-12-19 DIAGNOSIS — R269 Unspecified abnormalities of gait and mobility: Secondary | ICD-10-CM | POA: Diagnosis present

## 2014-12-19 DIAGNOSIS — I2510 Atherosclerotic heart disease of native coronary artery without angina pectoris: Secondary | ICD-10-CM | POA: Diagnosis present

## 2014-12-19 DIAGNOSIS — Z66 Do not resuscitate: Secondary | ICD-10-CM | POA: Diagnosis present

## 2014-12-19 DIAGNOSIS — Z955 Presence of coronary angioplasty implant and graft: Secondary | ICD-10-CM | POA: Diagnosis not present

## 2014-12-19 DIAGNOSIS — Y95 Nosocomial condition: Secondary | ICD-10-CM | POA: Diagnosis present

## 2014-12-19 DIAGNOSIS — I129 Hypertensive chronic kidney disease with stage 1 through stage 4 chronic kidney disease, or unspecified chronic kidney disease: Secondary | ICD-10-CM | POA: Diagnosis present

## 2014-12-19 DIAGNOSIS — J690 Pneumonitis due to inhalation of food and vomit: Secondary | ICD-10-CM | POA: Diagnosis present

## 2014-12-19 DIAGNOSIS — Z7901 Long term (current) use of anticoagulants: Secondary | ICD-10-CM | POA: Diagnosis not present

## 2014-12-19 DIAGNOSIS — K219 Gastro-esophageal reflux disease without esophagitis: Secondary | ICD-10-CM | POA: Diagnosis present

## 2014-12-19 DIAGNOSIS — Z8673 Personal history of transient ischemic attack (TIA), and cerebral infarction without residual deficits: Secondary | ICD-10-CM | POA: Diagnosis not present

## 2014-12-19 DIAGNOSIS — D649 Anemia, unspecified: Secondary | ICD-10-CM | POA: Diagnosis present

## 2014-12-19 DIAGNOSIS — I5042 Chronic combined systolic (congestive) and diastolic (congestive) heart failure: Secondary | ICD-10-CM

## 2014-12-19 DIAGNOSIS — R451 Restlessness and agitation: Secondary | ICD-10-CM | POA: Diagnosis present

## 2014-12-19 LAB — URINALYSIS, ROUTINE W REFLEX MICROSCOPIC
Glucose, UA: 100 mg/dL — AB
Ketones, ur: NEGATIVE mg/dL
Nitrite: NEGATIVE
PROTEIN: 30 mg/dL — AB
Specific Gravity, Urine: 1.02 (ref 1.005–1.030)
UROBILINOGEN UA: 1 mg/dL (ref 0.0–1.0)
pH: 5 (ref 5.0–8.0)

## 2014-12-19 LAB — URINE MICROSCOPIC-ADD ON

## 2014-12-19 LAB — CBC WITH DIFFERENTIAL/PLATELET
Basophils Absolute: 0.1 10*3/uL (ref 0.0–0.1)
Basophils Relative: 1 % (ref 0–1)
EOS ABS: 0 10*3/uL (ref 0.0–0.7)
Eosinophils Relative: 0 % (ref 0–5)
HEMATOCRIT: 31.8 % — AB (ref 39.0–52.0)
Hemoglobin: 9.9 g/dL — ABNORMAL LOW (ref 13.0–17.0)
LYMPHS ABS: 0.7 10*3/uL (ref 0.7–4.0)
LYMPHS PCT: 7 % — AB (ref 12–46)
MCH: 29.6 pg (ref 26.0–34.0)
MCHC: 31.1 g/dL (ref 30.0–36.0)
MCV: 95.2 fL (ref 78.0–100.0)
Monocytes Absolute: 0.8 10*3/uL (ref 0.1–1.0)
Monocytes Relative: 8 % (ref 3–12)
Neutro Abs: 8.2 10*3/uL — ABNORMAL HIGH (ref 1.7–7.7)
Neutrophils Relative %: 84 % — ABNORMAL HIGH (ref 43–77)
Platelets: 158 10*3/uL (ref 150–400)
RBC: 3.34 MIL/uL — ABNORMAL LOW (ref 4.22–5.81)
RDW: 15.9 % — AB (ref 11.5–15.5)
WBC: 9.8 10*3/uL (ref 4.0–10.5)

## 2014-12-19 LAB — PROTIME-INR
INR: 2.71 — ABNORMAL HIGH (ref 0.00–1.49)
PROTHROMBIN TIME: 29 s — AB (ref 11.6–15.2)

## 2014-12-19 LAB — COMPREHENSIVE METABOLIC PANEL
ALT: 11 U/L (ref 0–53)
AST: 31 U/L (ref 0–37)
Albumin: 3.2 g/dL — ABNORMAL LOW (ref 3.5–5.2)
Alkaline Phosphatase: 32 U/L — ABNORMAL LOW (ref 39–117)
Anion gap: 14 (ref 5–15)
BUN: 82 mg/dL — ABNORMAL HIGH (ref 6–23)
CHLORIDE: 118 mmol/L — AB (ref 96–112)
CO2: 22 mmol/L (ref 19–32)
CREATININE: 3.25 mg/dL — AB (ref 0.50–1.35)
Calcium: 8.5 mg/dL (ref 8.4–10.5)
GFR, EST AFRICAN AMERICAN: 18 mL/min — AB (ref >90)
GFR, EST NON AFRICAN AMERICAN: 15 mL/min — AB (ref >90)
Glucose, Bld: 150 mg/dL — ABNORMAL HIGH (ref 70–99)
Potassium: 4.4 mmol/L (ref 3.5–5.1)
Sodium: 154 mmol/L — ABNORMAL HIGH (ref 135–145)
Total Bilirubin: 2 mg/dL — ABNORMAL HIGH (ref 0.3–1.2)
Total Protein: 6.6 g/dL (ref 6.0–8.3)

## 2014-12-19 LAB — I-STAT CG4 LACTIC ACID, ED
Lactic Acid, Venous: 1.51 mmol/L (ref 0.5–2.0)
Lactic Acid, Venous: 2.41 mmol/L (ref 0.5–2.0)

## 2014-12-19 LAB — APTT: APTT: 48 s — AB (ref 24–37)

## 2014-12-19 LAB — PROCALCITONIN
PROCALCITONIN: 5.18 ng/mL
Procalcitonin: 3.89 ng/mL

## 2014-12-19 LAB — INFLUENZA PANEL BY PCR (TYPE A & B)
H1N1 flu by pcr: NOT DETECTED
INFLBPCR: NEGATIVE
Influenza A By PCR: NEGATIVE

## 2014-12-19 LAB — CREATININE, URINE, RANDOM: CREATININE, URINE: 108.97 mg/dL

## 2014-12-19 LAB — STREP PNEUMONIAE URINARY ANTIGEN: STREP PNEUMO URINARY ANTIGEN: NEGATIVE

## 2014-12-19 LAB — LACTIC ACID, PLASMA: Lactic Acid, Venous: 1.4 mmol/L (ref 0.5–2.0)

## 2014-12-19 LAB — BRAIN NATRIURETIC PEPTIDE: B Natriuretic Peptide: 1682.8 pg/mL — ABNORMAL HIGH (ref 0.0–100.0)

## 2014-12-19 LAB — MRSA PCR SCREENING: MRSA by PCR: NEGATIVE

## 2014-12-19 MED ORDER — TRAMADOL HCL 50 MG PO TABS: 25.0000 mg | ORAL_TABLET | Freq: Two times a day (BID) | ORAL | Status: DC | PRN

## 2014-12-19 MED ORDER — POLYETHYLENE GLYCOL 3350 17 G PO PACK: 17.0000 g | PACK | Freq: Every day | ORAL | Status: DC

## 2014-12-19 MED ORDER — WARFARIN - PHARMACIST DOSING INPATIENT: Freq: Every day | Status: DC

## 2014-12-19 MED ORDER — CARVEDILOL 3.125 MG PO TABS
3.1250 mg | ORAL_TABLET | Freq: Two times a day (BID) | ORAL | Status: DC
  Filled 2014-12-19 (×3): qty 1

## 2014-12-19 MED ORDER — PIPERACILLIN-TAZOBACTAM IN DEX 2-0.25 GM/50ML IV SOLN
2.2500 g | Freq: Four times a day (QID) | INTRAVENOUS | Status: DC
  Administered 2014-12-19 – 2014-12-20 (×5): 2.25 g via INTRAVENOUS
  Filled 2014-12-19 (×8): qty 50

## 2014-12-19 MED ORDER — ACETAMINOPHEN 325 MG RE SUPP
975.0000 mg | Freq: Once | RECTAL | Status: AC
  Administered 2014-12-19: 975 mg via RECTAL
  Filled 2014-12-19: qty 3

## 2014-12-19 MED ORDER — VANCOMYCIN HCL IN DEXTROSE 1-5 GM/200ML-% IV SOLN
1000.0000 mg | Freq: Once | INTRAVENOUS | Status: AC
  Administered 2014-12-19: 1000 mg via INTRAVENOUS
  Filled 2014-12-19: qty 200

## 2014-12-19 MED ORDER — ACETAMINOPHEN 500 MG PO TABS: 1000.0000 mg | ORAL_TABLET | Freq: Three times a day (TID) | ORAL | Status: DC

## 2014-12-19 MED ORDER — SODIUM CHLORIDE 0.9 % IV BOLUS (SEPSIS)
500.0000 mL | Freq: Once | INTRAVENOUS | Status: AC
  Administered 2014-12-19: 500 mL via INTRAVENOUS

## 2014-12-19 MED ORDER — LORAZEPAM 2 MG/ML IJ SOLN
1.0000 mg | INTRAMUSCULAR | Status: DC | PRN
  Administered 2014-12-19 – 2014-12-20 (×3): 1 mg via INTRAVENOUS
  Filled 2014-12-19 (×3): qty 1

## 2014-12-19 MED ORDER — PIPERACILLIN-TAZOBACTAM 3.375 G IVPB 30 MIN
3.3750 g | Freq: Once | INTRAVENOUS | Status: AC
  Administered 2014-12-19: 3.375 g via INTRAVENOUS
  Filled 2014-12-19: qty 50

## 2014-12-19 MED ORDER — SODIUM CHLORIDE 0.9 % IV SOLN
INTRAVENOUS | Status: DC
  Administered 2014-12-19: 04:00:00 via INTRAVENOUS

## 2014-12-19 MED ORDER — WARFARIN SODIUM 2.5 MG PO TABS: 3.7500 mg | ORAL_TABLET | ORAL | Status: DC

## 2014-12-19 MED ORDER — ACETAMINOPHEN 650 MG RE SUPP: 650.0000 mg | RECTAL | Status: DC | PRN

## 2014-12-19 MED ORDER — SODIUM CHLORIDE 0.9 % IV BOLUS (SEPSIS)
500.0000 mL | INTRAVENOUS | Status: AC
  Administered 2014-12-19: 500 mL via INTRAVENOUS

## 2014-12-19 MED ORDER — MORPHINE SULFATE 2 MG/ML IJ SOLN: 1.0000 mg | INTRAMUSCULAR | Status: DC | PRN

## 2014-12-19 MED ORDER — LEVOTHYROXINE SODIUM 25 MCG PO TABS
25.0000 ug | ORAL_TABLET | Freq: Every day | ORAL | Status: DC
  Filled 2014-12-19 (×2): qty 1

## 2014-12-19 MED ORDER — VASCULERA PO TABS: 1.0000 | ORAL_TABLET | Freq: Every day | ORAL | Status: DC

## 2014-12-19 MED ORDER — SODIUM CHLORIDE 0.9 % IV BOLUS (SEPSIS)
1000.0000 mL | INTRAVENOUS | Status: AC
  Administered 2014-12-19 (×2): 1000 mL via INTRAVENOUS

## 2014-12-19 MED ORDER — VANCOMYCIN HCL IN DEXTROSE 1-5 GM/200ML-% IV SOLN
1000.0000 mg | INTRAVENOUS | Status: DC
Start: 2014-12-21 — End: 2014-12-20

## 2014-12-19 MED ORDER — NITROGLYCERIN 0.4 MG SL SUBL: 0.4000 mg | SUBLINGUAL_TABLET | SUBLINGUAL | Status: DC | PRN

## 2014-12-19 MED ORDER — SODIUM CHLORIDE 0.9 % IV SOLN: INTRAVENOUS | Status: DC

## 2014-12-19 MED ORDER — WARFARIN SODIUM 2.5 MG PO TABS
2.5000 mg | ORAL_TABLET | ORAL | Status: DC
  Filled 2014-12-19: qty 1

## 2014-12-19 MED ORDER — ACETAMINOPHEN 500 MG PO TABS: 1000.0000 mg | ORAL_TABLET | Freq: Three times a day (TID) | ORAL | Status: DC | PRN

## 2014-12-19 MED ORDER — DEXTROSE 5 % IV SOLN
INTRAVENOUS | Status: DC
  Administered 2014-12-19: 09:00:00 via INTRAVENOUS

## 2014-12-19 MED ORDER — FENOFIBRATE 54 MG PO TABS
54.0000 mg | ORAL_TABLET | Freq: Every day | ORAL | Status: DC
  Filled 2014-12-19: qty 1

## 2014-12-19 NOTE — ED Provider Notes (Signed)
CSN: [id number]     Arrival date & time 12/04/2014  2359 History   This chart was scribed for Everlene Balls, MD by Randa Evens, ED Scribe. This patient was seen in room B19C/B19C and the patient's care was started at 12:15 AM.   Chief Complaint  Patient presents with  . Cough  . Fever   Patient is a 81 y.o. male presenting with cough and fever. The history is provided by the nursing home. No language interpreter was used.  Cough Cough characteristics:  Productive Severity:  Moderate Duration:  1 day Associated symptoms: fever   Fever Associated symptoms: cough   HPI Comments: Level 5 Caveat: AMS Laquincy G Kimoto Sr. is a 81 y.o. male brought in by ambulance, who presents to the Emergency Department complaining of productive cough onset toady. Pt presents with associated fever. Pt is from nursing home.    Past Medical History  Diagnosis Date  . Coronary artery disease     a.  s/p MI;  b. s/p CABG 2007;  c. cath 12/08: LM occluded, pRCA 80%, dRCA 50%, PLB 90%, S-PDA ok, S-Dx ok with occluded dist Dx; L-LAD ok, S-OM ok with 90% after graft insertion . . . . distal OM treated with DES  . Ischemic cardiomyopathy     echo 6/12 EF 35-40%, apical, dAnt, mid to dist inf-lat HK, mild AI, mod MR, mod BAE, PASP 44  . Chronic combined systolic and diastolic heart failure     admx 8/13;   . Hypertension   . Atrial fibrillation     a. on coumadin.  . CKD (chronic kidney disease), stage IV   . CVA (cerebral infarction)     left frontal  . Anemia     chronic  . Hypothyroidism   . Leg muscle spasm   . GERD (gastroesophageal reflux disease)   . Skin cancer    Past Surgical History  Procedure Laterality Date  . Coronary artery bypass graft  2007  . Coronary angioplasty with stent placement    . Femur im nail Right 12/11/2014    Procedure: INTRAMEDULLARY (IM) NAIL FEMORAL;  Surgeon: Mcarthur Rossetti, MD;  Location: [facility];  Service: Orthopedics;  Laterality: Right;   Family History   Problem Relation Age of Onset  . Cancer Mother   . Stroke Father    History  Substance Use Topics  . Smoking status: Never Smoker   . Smokeless tobacco: Never Used  . Alcohol Use: No    Review of Systems  Unable to perform ROS: Mental status change  Constitutional: Positive for fever.  Respiratory: Positive for cough.     Allergies  Ace inhibitors  Home Medications   Prior to Admission medications   Medication Sig Start Date End Date Taking? Authorizing Provider  acetaminophen (TYLENOL) 500 MG tablet Take 2 tablets (1,000 mg total) by mouth every 8 (eight) hours. For 5 more days from 12/15/14 and then discontinue and change to when necessary 12/15/14   Jonetta Osgood, MD  carvedilol (COREG) 3.125 MG tablet Take 1 tablet (3.125 mg total) by mouth 2 (two) times daily with a meal. 10/22/14   Minus Breeding, MD  Dietary Management Product (VASCULERA PO) Take 1 tablet by mouth daily. 630 mg tablet    Historical Provider, MD  Dietary Management Product (VASCULERA) TABS TAKE 1 TABLET BY MOUTH DAILY Patient not taking: Reported on 12/10/2014 06/30/14   Janith Lima, MD  fenofibrate 54 MG tablet TAKE 1 TABLET BY MOUTH  DAILY 09/16/14   Minus Breeding, MD  furosemide (LASIX) 20 MG tablet TAKE 2 TABLETS BY MOUTH DAILY 11/15/14   Minus Breeding, MD  levothyroxine (SYNTHROID, LEVOTHROID) 25 MCG tablet Take 25 mcg by mouth daily before breakfast.    Historical Provider, MD  nitroGLYCERIN (NITROSTAT) 0.4 MG SL tablet Place 1 tablet (0.4 mg total) under the tongue every 5 (five) minutes as needed for chest pain. 05/18/14   Minus Breeding, MD  polyethylene glycol [facility] / GLYCOLAX) packet Take 17 g by mouth daily. 12/15/14   Shanker Kristeen Mans, MD  RAPAFLO 8 MG CAPS capsule TAKE 1 CAPLET BY MOUTH DAILY WITH BREAKFAST. 05/08/14   Janith Lima, MD  traMADol (ULTRAM) 50 MG tablet Take 0.5 tablets (25 mg total) by mouth every 12 (twelve) hours as needed for moderate pain. 12/15/14   Shanker Kristeen Mans, MD   warfarin (COUMADIN) 2.5 MG tablet Take 1 to 1.5 tablets by mouth daily as directed by coumadin clinic Patient taking differently: Take 2.5 mg by mouth See admin instructions. Take 1 every day except on Monday and Thursday take  1.5 tablets by mouth daily as directed by coumadin clinic. 10/26/14   Minus Breeding, MD   BP 84/43 mmHg  Pulse 110  Temp(Src) 101.4 F (38.6 C) (Rectal)  Resp 19  Ht 5\' 10"  (1.778 m)  Wt 167 lb (75.751 kg)  BMI 23.96 kg/m2  SpO2 94%   Physical Exam  Constitutional: He is oriented to person, place, and time. Vital signs are normal. He appears well-developed and well-nourished.  Non-toxic appearance. He does not appear ill. He appears distressed. Nasal cannula in place.  HENT:  Head: Normocephalic and atraumatic.  Nose: Nose normal.  Mouth/Throat: Oropharynx is clear and moist. No oropharyngeal exudate.  Eyes: Conjunctivae and EOM are normal. Pupils are equal, round, and reactive to light. No scleral icterus.  Neck: Normal range of motion. Neck supple. No tracheal deviation, no edema, no erythema and normal range of motion present. No thyroid mass and no thyromegaly present.  Cardiovascular: Normal rate, regular rhythm, S1 normal, S2 normal, normal heart sounds, intact distal pulses and normal pulses.  Exam reveals no gallop and no friction rub.   No murmur heard. Pulses:      Radial pulses are 2+ on the right side, and 2+ on the left side.       Dorsalis pedis pulses are 2+ on the right side, and 2+ on the left side.  Pulmonary/Chest: Effort normal and breath sounds normal. No respiratory distress. He has no wheezes. He has no rhonchi. He has no rales.  Abdominal: Soft. Normal appearance and bowel sounds are normal. He exhibits no distension, no ascites and no mass. There is no hepatosplenomegaly. There is no tenderness. There is no rebound, no guarding and no CVA tenderness.  Musculoskeletal: Normal range of motion. He exhibits no edema or tenderness.  3 surgical  scars on right lateral thigh clean dry and intact with staples in place no warmth or cellulitis.  Lymphadenopathy:    He has no cervical adenopathy.  Neurological: He is alert and oriented to person, place, and time. He has normal strength. No cranial nerve deficit or sensory deficit.  Skin: Skin is warm, dry and intact. No petechiae and no rash noted. He is not diaphoretic. No erythema. No pallor.  Tactile fever.   Psychiatric: He has a normal mood and affect. His behavior is normal. Judgment normal.  Nursing note and vitals reviewed.   ED Course  Procedures (including critical care time) DIAGNOSTIC STUDIES: Oxygen Saturation is 94% on [place], adequate by my interpretation.    COORDINATION OF CARE:    Labs Review Labs Reviewed  CBC WITH DIFFERENTIAL/PLATELET - Abnormal; Notable for the following:    RBC 3.34 (*)    Hemoglobin 9.9 (*)    HCT 31.8 (*)    RDW 15.9 (*)    All other components within normal limits  COMPREHENSIVE METABOLIC PANEL - Abnormal; Notable for the following:    Sodium 154 (*)    Chloride 118 (*)    Glucose, Bld 150 (*)    BUN 82 (*)    Creatinine, Ser 3.25 (*)    Albumin 3.2 (*)    Alkaline Phosphatase 32 (*)    Total Bilirubin 2.0 (*)    GFR calc non Af Amer 15 (*)    GFR calc Af Amer 18 (*)    All other components within normal limits  PROTIME-INR - Abnormal; Notable for the following:    Prothrombin Time 29.0 (*)    INR 2.71 (*)    All other components within normal limits  I-STAT CG4 LACTIC ACID, ED - Abnormal; Notable for the following:    Lactic Acid, Venous 2.41 (*)    All other components within normal limits  CULTURE, BLOOD (ROUTINE X 2)  CULTURE, BLOOD (ROUTINE X 2)  URINE CULTURE  PROCALCITONIN  URINALYSIS, ROUTINE W REFLEX MICROSCOPIC    Imaging Review No results found.   EKG Interpretation None      MDM   Final diagnoses:  None    Patient presents emergency department with respiratory distress. Patient appears to be  septic with low blood pressures, tachycardia, and hypoxia. Chest x-ray showed the left lower lobe pneumonia. Patient was given 30 mL/kg fluid bolus, blood cultures were drawn, he was given vancomycin and cefepime for broad-spectrum coverage.  Laboratory studies also shows a sodium of 154 and acute kidney injury with a creatinine of 3.25. Patient be admitted to [facility], stepdown unit for continued management.  Repeat lactate is currently pending.   I personally performed the services described in this documentation, which was scribed in my presence. The recorded information has been reviewed and is accurate.   CRITICAL CARE Performed by: Everlene Balls   Total critical care time: 42min  Critical care time was exclusive of separately billable procedures and treating other patients.  Critical care was necessary to treat or prevent imminent or life-threatening deterioration.  Critical care was time spent personally by me on the following activities: development of treatment plan with patient and/or surrogate as well as nursing, discussions with consultants, evaluation of patient's response to treatment, examination of patient, obtaining history from patient or surrogate, ordering and performing treatments and interventions, ordering and review of laboratory studies, ordering and review of radiographic studies, pulse oximetry and re-evaluation of patient's condition.   Everlene Balls, MD 12/19/14 [id number]

## 2014-12-19 NOTE — ED Notes (Signed)
Pts family members stated that the pt had his foley cathter removed on Wednesday. While in the hospital the pt attempted to remove the foley himself.

## 2014-12-19 NOTE — H&P (Signed)
[facility] History and Physical  [name] RUFFINI Sr. EUM:[id number] DOB: 24-Sep-1919 DOA: 12/10/2014  Referring physician: ED physician PCP: [name] Calico, MD  Specialists:   Chief Complaint: cough, fever, SOB  HPI: [name] Amos Sr. is a 81 y.o. male with past medical history of hypertension, GERD, hypothyroidism, coronary artery disease (post status of CABG), combined systolic and diastolic congestive heart failure, atrial fibrillation on Coumadin, chronic kidney disease-stage IV, stroke, history of skin cancer, recent right hip fracture (post status of repair 4/16), history of difficulty swallowing, who presents with cough and fever  Patient is accompanied by his daughter and wife. Per family, patient had right hip surgery on 4/16. Currently he is doing rehabilitation in nursing home. He has chronic problem of swallowing. Today, patient started having cough, SOB and fever. He coughs up foamy mucus. No chest pain. No runny nose, sore throat.   ROS: currently patient denies running nose, ear pain, headaches, chest pain, abdominal pain, diarrhea, constipation, dysuria, urgency, frequency, hematuria, skin rashes or leg swelling. No unilateral weakness, numbness or tingling sensations. No vision change or hearing loss.  In ED, patient was found to have WBC 9.8, temperature 101.4, tachycardia, slightly worsening renal function. Chest x-ray showed left lower lobe pneumonia. She submitted to inpatient for further evaluation and treatment.  Review of Systems: As presented in the history of presenting illness, rest negative.  Where does patient live?  At SNF Can patient participate in ADLs? none  Allergy:  Allergies  Allergen Reactions  . Ace Inhibitors     Cannot remember the reaction    Past Medical History  Diagnosis Date  . Coronary artery disease     a.  s/p MI;  b. s/p CABG 2007;  c. cath 12/08: LM occluded, pRCA 80%, dRCA 50%, PLB 90%, S-PDA ok, S-Dx ok with occluded dist Dx;  L-LAD ok, S-OM ok with 90% after graft insertion . . . . distal OM treated with DES  . Ischemic cardiomyopathy     echo 6/12 EF 35-40%, apical, dAnt, mid to dist inf-lat HK, mild AI, mod MR, mod BAE, PASP 44  . Chronic combined systolic and diastolic heart failure     admx 8/13;   . Hypertension   . Atrial fibrillation     a. on coumadin.  . CKD (chronic kidney disease), stage IV   . CVA (cerebral infarction)     left frontal  . Anemia     chronic  . Hypothyroidism   . Leg muscle spasm   . GERD (gastroesophageal reflux disease)   . Skin cancer     Past Surgical History  Procedure Laterality Date  . Coronary artery bypass graft  2007  . Coronary angioplasty with stent placement    . Femur im nail Right 12/11/2014    Procedure: INTRAMEDULLARY (IM) NAIL FEMORAL;  Surgeon: Mcarthur Rossetti, MD;  Location: [facility];  Service: Orthopedics;  Laterality: Right;    Social History:  reports that he has never smoked. He has never used smokeless tobacco. He reports that he does not drink alcohol or use illicit drugs.  Family History:  Family History  Problem Relation Age of Onset  . Cancer Mother   . Stroke Father      Prior to Admission medications   Medication Sig Start Date End Date Taking? Authorizing Provider  acetaminophen (TYLENOL) 500 MG tablet Take 2 tablets (1,000 mg total) by mouth every 8 (eight) hours. For 5 more days from 12/15/14 and then discontinue  and change to when necessary 12/15/14  Yes Shanker Kristeen Mans, MD  Alum & Mag Hydroxide-Simeth (MAGIC MOUTHWASH) SOLN Take 5 mLs by mouth 4 (four) times daily. Swish and spit-take for 7 days starting 12/16/14 12/16/14 12/22/14 Yes Historical Provider, MD  carvedilol (COREG) 3.125 MG tablet Take 1 tablet (3.125 mg total) by mouth 2 (two) times daily with a meal. 10/22/14  Yes Minus Breeding, MD  Dietary Management Product (VASCULERA) TABS TAKE 1 TABLET BY MOUTH DAILY 06/30/14  Yes Janith Lima, MD  fenofibrate 54 MG tablet TAKE 1  TABLET BY MOUTH DAILY 09/16/14  Yes Minus Breeding, MD  furosemide (LASIX) 20 MG tablet TAKE 2 TABLETS BY MOUTH DAILY 11/15/14  Yes Minus Breeding, MD  levothyroxine (SYNTHROID, LEVOTHROID) 25 MCG tablet Take 25 mcg by mouth daily before breakfast.   Yes Historical Provider, MD  polyethylene glycol (MIRALAX / GLYCOLAX) packet Take 17 g by mouth daily. 12/15/14  Yes Shanker Kristeen Mans, MD  RAPAFLO 8 MG CAPS capsule TAKE 1 CAPLET BY MOUTH DAILY WITH BREAKFAST. 05/08/14  Yes Janith Lima, MD  traMADol (ULTRAM) 50 MG tablet Take 0.5 tablets (25 mg total) by mouth every 12 (twelve) hours as needed for moderate pain. 12/15/14  Yes Shanker Kristeen Mans, MD  warfarin (COUMADIN) 2.5 MG tablet Take 1 to 1.5 tablets by mouth daily as directed by coumadin clinic Patient taking differently: Take 2.5 mg by mouth See admin instructions. Take 1 every day except on Monday and Thursday take  1.5 tablets by mouth daily as directed by coumadin clinic. 10/26/14  Yes Minus Breeding, MD  nitroGLYCERIN (NITROSTAT) 0.4 MG SL tablet Place 1 tablet (0.4 mg total) under the tongue every 5 (five) minutes as needed for chest pain. 05/18/14   Minus Breeding, MD    Physical Exam: Filed Vitals:   12/19/14 0115 12/19/14 0130 12/19/14 0245 12/19/14 0252  BP: 108/57 99/58 104/59   Pulse: 101 85 51   Temp:    100.6 F (38.1 C)  TempSrc:    Rectal  Resp: 26 19 22    Height:      Weight:      SpO2: 96% 94% 100%    General: Moderate distress HEENT:       Eyes: PERRL, EOMI, no scleral icterus       ENT: No discharge from the ears and nose, no pharynx injection, no tonsillar enlargement.        Neck: No JVD, no bruit, no mass felt. Cardiac: S1/S2, RRR, No murmurs, No gallops or rubs Pulm: Has rhonchi bilaterally, no rales, rubs. Abd: Soft, nondistended, nontender, no rebound pain, no organomegaly, BS present Ext: No edema bilaterally. 2+DP/PT pulse bilaterally Musculoskeletal: No joint deformities. 3 surgical scars on right lateral  thigh are clean, dry and intact , with staples in place. No warmth or signs of infeciton.  Skin: No rashes.  Neuro: Alert and oriented X3, cranial nerves II-XII grossly intact, muscle strength 5/5 in all extremeties, sensation to light touch intact.  Psych: Patient is not psychotic, no suicidal or hemocidal ideation.  Labs on Admission:  Basic Metabolic Panel:  Recent Labs Lab 12/12/14 0520 12/13/14 0539 12/14/14 0504 12/19/14 0030  NA 142 143 148* 154*  K 4.0 3.9 4.0 4.4  CL 110 112 114* 118*  CO2 18* 21 23 22   GLUCOSE 140* 141* 146* 150*  BUN 64* 66* 67* 82*  CREATININE 2.99* 3.07* 2.92* 3.25*  CALCIUM 7.8* 8.0* 8.5 8.5   Liver Function Tests:  Recent  Labs Lab 12/19/14 0030  AST 31  ALT 11  ALKPHOS 32*  BILITOT 2.0*  PROT 6.6  ALBUMIN 3.2*   No results for input(s): LIPASE, AMYLASE in the last 168 hours. No results for input(s): AMMONIA in the last 168 hours. CBC:  Recent Labs Lab 12/12/14 0520 12/13/14 0539 12/14/14 0504 12/15/14 0541 12/19/14 0030  WBC 8.1 7.2 7.0 6.1 9.8  NEUTROABS  --   --   --   --  8.2*  HGB 8.7* 7.8* 9.1* 9.2* 9.9*  HCT 26.7* 24.0* 27.5* 28.4* 31.8*  MCV 91.1 91.6 90.2 91.9 95.2  PLT 80* 68* 78* 97* 158   Cardiac Enzymes: No results for input(s): CKTOTAL, CKMB, CKMBINDEX, TROPONINI in the last 168 hours.  BNP (last 3 results) No results for input(s): BNP in the last 8760 hours.  ProBNP (last 3 results)  Recent Labs  03/06/14 1206  PROBNP 15268.0*    CBG:  Recent Labs Lab 12/12/14 0545 12/13/14 0558 12/15/14 0645  GLUCAP 119* 132* 135*    Radiological Exams on Admission: Dg Chest 2 View  12/19/2014   CLINICAL DATA:  Congestion with productive cough and fever. Possible sepsis.  EXAM: CHEST  2 VIEW  COMPARISON:  12/10/2014.  FINDINGS: AP semi upright radiograph. Low lung volumes accentuate the cardiac size, which is overall increased but probably not significantly greater than the radiograph 9 days ago. There is  consolidation in the retrocardiac region, consistent with LEFT lower lobe pneumonia. This was not present previously. Prior CABG. Osseous demineralization. Aortic atherosclerosis.  IMPRESSION: Cardiomegaly.  Suspected acute LEFT lower lobe pneumonia.   Electronically Signed   By: Rolla Flatten M.D.   On: 12/19/2014 02:25    EKG: will get one.  Assessment/Plan Principal Problem:   HCAP (healthcare-associated pneumonia) Active Problems:   Essential hypertension   Coronary atherosclerosis   Atrial fibrillation   Sleep apnea   Long term current use of anticoagulant   Hypothyroidism   Chronic combined systolic and diastolic heart failure   Acute renal failure superimposed on stage 4 chronic kidney disease   Gait disorder   Chronic atrial fibrillation   Thrombocytopenia   Sepsis  HCAP vs. aspiration pneumonia: Chest x-ray showed left lower lobe infiltration. Patient has some difficulty swallowing, it is possible for aspiration pneumonia. Patient is septic on admission with soft blood pressure, tachycardia, fever.   - Will admit to Telemetry Bed - IV Vancomycin and Zosyn - Urine legionella and S. pneumococcal antigen - Follow up blood culture x2, sputum culture and respiratory virus panel, plus Flu pcr - will get Procalcitonin and trend lactic acid level per sepsis protocol - IVF:  2L of NS bolus in ED, will give gentle IVF, 50 mL per hour of normal saline given dCHF - get SLP  Combined systolic and diastolic congestive heart failure: 2-D echo on 02/22/11 showed EF 35-40%. He is taking Lasix 40 mg daily. He is clinically dry. No any leg edema. -will hold lasix -check BNP -Continue Coreg  Atrial Fibrillation: CHA2DS2-VASc Score is 7, needs oral anticoagulation. Patient is on Coumadin at home. INR is 2.71 on admission. Heart rate is well controlled. -continue Coumadin and Coreg  Hypothyroidism: TSH was at 2.62 on 03/06/14. -Continue Synthroid next  Coronary artery disease: Post status  of CABG. Currently patient does not have chest pain. -Check EKG -Continue Coreg  Acute on chronic kidney disease-4: Baseline creatinine 2.5-3.0. His creatinine is 3.25, BUN 82 on admission, which is higher than her baseline. likely due  to prerenal. -Check FeUrea and renal ultrasound -IV fluid as avove -hold her Lasix  Thrombocytopenia: this is chronic issue. Platelet is better than previous admission, 97-->158. -Follow-up CBC  DVT ppx: on coumadin  Code Status: DNR Family Communication:  Yes, patient's  Wife and daughter     at bed side Disposition Plan: Admit to inpatient   Date of Service 12/19/2014    Ivor Costa [facility] Pager [phone]  If 7PM-7AM, please contact night-coverage [email or web address] Password [facility] 12/19/2014, 2:59 AM

## 2014-12-19 NOTE — ED Notes (Signed)
Condom catheter applied to pt 

## 2014-12-19 NOTE — ED Notes (Signed)
Staples in place to right hip, right thigh, and outer right knee.

## 2014-12-19 NOTE — Progress Notes (Addendum)
Patient admitted after midnight. Chart reviewed. Patient examined.  Pt very congested, agitated, encephalopathic wet cough.  Prognosis is poor.  Discussed with family who voice understanding.  If pt does not improve within the next 24-48 hours, they will likely transition to comfort care.  Comfort remains paramount.  Not safe to take anything by mouth in current state. Will hold all po meds and continue NPO. nt suction prn  Doree Barthel, MD [facility] [email or web address]

## 2014-12-19 NOTE — ED Notes (Signed)
Admitting at bedside 

## 2014-12-19 NOTE — ED Notes (Signed)
Dr. Oni at bedside. 

## 2014-12-19 NOTE — Progress Notes (Addendum)
ANTIBIOTIC CONSULT NOTE - INITIAL  Pharmacy Consult for Vancomycin and Zosyn  Indication: Sepsis  Allergies  Allergen Reactions  . Ace Inhibitors     Cannot remember the reaction    Patient Measurements: Height: 5\' 10"  (177.8 cm) Weight: 167 lb (75.751 kg) IBW/kg (Calculated) : 73   Vital Signs: Temp: 101.4 F (38.6 C) (04/24 0010) Temp Source: Rectal (04/24 0010) BP: 84/43 mmHg (04/24 0010) Pulse Rate: 110 (04/24 0010) Intake/Output from previous day:   Intake/Output from this shift:    Labs: No results for input(s): WBC, HGB, PLT, LABCREA, CREATININE in the last 72 hours. Estimated Creatinine Clearance: 16.7 mL/min (by C-G formula based on Cr of 2.92). No results for input(s): VANCOTROUGH, VANCOPEAK, VANCORANDOM, GENTTROUGH, GENTPEAK, GENTRANDOM, TOBRATROUGH, TOBRAPEAK, TOBRARND, AMIKACINPEAK, AMIKACINTROU, AMIKACIN in the last 72 hours.   Microbiology: Recent Results (from the past 720 hour(s))  Surgical pcr screen     Status: Abnormal   Collection Time: 12/11/14  8:38 AM  Result Value Ref Range Status   MRSA, PCR NEGATIVE NEGATIVE Final   Staphylococcus aureus POSITIVE (A) NEGATIVE Final    Comment:        The Xpert SA Assay (FDA approved for NASAL specimens in patients over 78 years of age), is one component of a comprehensive surveillance program.  Test performance has been validated by [facility] for patients greater than or equal to 46 year old. It is not intended to diagnose infection nor to guide or monitor treatment.     Medical History: Past Medical History  Diagnosis Date  . Coronary artery disease     a.  s/p MI;  b. s/p CABG 2007;  c. cath 12/08: LM occluded, pRCA 80%, dRCA 50%, PLB 90%, S-PDA ok, S-Dx ok with occluded dist Dx; L-LAD ok, S-OM ok with 90% after graft insertion . . . . distal OM treated with DES  . Ischemic cardiomyopathy     echo 6/12 EF 35-40%, apical, dAnt, mid to dist inf-lat HK, mild AI, mod MR, mod BAE, PASP 44  .  Chronic combined systolic and diastolic heart failure     admx 8/13;   . Hypertension   . Atrial fibrillation     a. on coumadin.  . CKD (chronic kidney disease), stage IV   . CVA (cerebral infarction)     left frontal  . Anemia     chronic  . Hypothyroidism   . Leg muscle spasm   . GERD (gastroesophageal reflux disease)   . Skin cancer     Assessment: 81 y.o male presenting with cough and fever. H/o CKD, stage IV.  Other past medical history as noted above. Pharmacy consulted to start IV vancomycin and zosyn for sepsis.   Goal of Therapy:  Vancomycin trough level 15-20 mcg/ml    Plan:  Zosyn 2.25 gm IV q6h Vancomycin 1000 mg IV q48h Monitor clinical status, renal function and culture results daily. Vancomycin trough at steady state if needed per protocol.   Nicole Cella, RPh Clinical Pharmacist Pager: [phone] 12/19/2014,12:22 AM

## 2014-12-19 NOTE — ED Notes (Signed)
Brought by EMS from [facility].  Per EMS staff said he started having a productive cough earlier today.  Very congested at this time.

## 2014-12-19 NOTE — ED Notes (Signed)
Condom cath removed so in and out cath could be performed.

## 2014-12-19 NOTE — Progress Notes (Signed)
ANTICOAGULATION CONSULT NOTE - Initial Consult  Pharmacy Consult for Coumadin Indication: atrial fibrillation  Allergies  Allergen Reactions  . Ace Inhibitors     Cannot remember the reaction    Patient Measurements: Height: 6' (182.9 cm) Weight: 157 lb (71.215 kg) IBW/kg (Calculated) : 77.6  Vital Signs: Temp: 100.6 F (38.1 C) (04/24 0252) Temp Source: Rectal (04/24 0252) BP: 101/47 mmHg (04/24 0351) Pulse Rate: 83 (04/24 0351)  Labs:  Recent Labs  12/19/14 0030  HGB 9.9*  HCT 31.8*  PLT 158  LABPROT 29.0*  INR 2.71*  CREATININE 3.25*    Estimated Creatinine Clearance: 14.6 mL/min (by C-G formula based on Cr of 3.25).   Medical History: Past Medical History  Diagnosis Date  . Coronary artery disease     a.  s/p MI;  b. s/p CABG 2007;  c. cath 12/08: LM occluded, pRCA 80%, dRCA 50%, PLB 90%, S-PDA ok, S-Dx ok with occluded dist Dx; L-LAD ok, S-OM ok with 90% after graft insertion . . . . distal OM treated with DES  . Ischemic cardiomyopathy     echo 6/12 EF 35-40%, apical, dAnt, mid to dist inf-lat HK, mild AI, mod MR, mod BAE, PASP 44  . Chronic combined systolic and diastolic heart failure     admx 8/13;   . Hypertension   . Atrial fibrillation     a. on coumadin.  . CKD (chronic kidney disease), stage IV   . CVA (cerebral infarction)     left frontal  . Anemia     chronic  . Hypothyroidism   . Leg muscle spasm   . GERD (gastroesophageal reflux disease)   . Skin cancer     Assessment: 81yo male admitted for sepsis to continue Coumadin for Afib; current INR therapeutic, last Coumadin dose taken 4/23 PTA.  Goal of Therapy:  INR 2-3   Plan:  Will continue home Coumadin dose of 2.5mg  TWFSS and 3.75mg  MTh and monitor INR for dose adjustments.  Wynona Neat, PharmD, BCPS  12/19/2014,4:09 AM

## 2014-12-20 ENCOUNTER — Telehealth (HOSPITAL_BASED_OUTPATIENT_CLINIC_OR_DEPARTMENT_OTHER): Payer: Self-pay | Admitting: Emergency Medicine

## 2014-12-20 LAB — CBC
HCT: 34 % — ABNORMAL LOW (ref 39.0–52.0)
Hemoglobin: 10.1 g/dL — ABNORMAL LOW (ref 13.0–17.0)
MCH: 29.6 pg (ref 26.0–34.0)
MCHC: 29.7 g/dL — AB (ref 30.0–36.0)
MCV: 99.7 fL (ref 78.0–100.0)
Platelets: 192 10*3/uL (ref 150–400)
RBC: 3.41 MIL/uL — ABNORMAL LOW (ref 4.22–5.81)
RDW: 16.4 % — ABNORMAL HIGH (ref 11.5–15.5)
WBC: 15.1 10*3/uL — ABNORMAL HIGH (ref 4.0–10.5)

## 2014-12-20 LAB — BASIC METABOLIC PANEL
Anion gap: 16 — ABNORMAL HIGH (ref 5–15)
BUN: 78 mg/dL — ABNORMAL HIGH (ref 6–23)
CO2: 20 mmol/L (ref 19–32)
Calcium: 8 mg/dL — ABNORMAL LOW (ref 8.4–10.5)
Chloride: 118 mmol/L — ABNORMAL HIGH (ref 96–112)
Creatinine, Ser: 3.18 mg/dL — ABNORMAL HIGH (ref 0.50–1.35)
GFR calc Af Amer: 18 mL/min — ABNORMAL LOW (ref >90)
GFR calc non Af Amer: 16 mL/min — ABNORMAL LOW (ref >90)
Glucose, Bld: 179 mg/dL — ABNORMAL HIGH (ref 70–99)
Potassium: 4.2 mmol/L (ref 3.5–5.1)
Sodium: 154 mmol/L — ABNORMAL HIGH (ref 135–145)

## 2014-12-20 LAB — URINE CULTURE
COLONY COUNT: NO GROWTH
Culture: NO GROWTH

## 2014-12-20 LAB — PROTIME-INR
INR: 3.46 — ABNORMAL HIGH (ref 0.00–1.49)
Prothrombin Time: 35.1 seconds — ABNORMAL HIGH (ref 11.6–15.2)

## 2014-12-20 LAB — UREA NITROGEN, URINE: UREA NITROGEN UR: 885 mg/dL

## 2014-12-20 MED ORDER — LORAZEPAM 2 MG/ML IJ SOLN: 0.5000 mg | INTRAMUSCULAR | Status: DC | PRN

## 2014-12-21 LAB — RESPIRATORY VIRUS PANEL
ADENOVIRUS: NEGATIVE
Influenza A: NEGATIVE
Influenza B: POSITIVE — AB
Metapneumovirus: NEGATIVE
PARAINFLUENZA 1 A: NEGATIVE
PARAINFLUENZA 2 A: NEGATIVE
Parainfluenza 3: NEGATIVE
RESPIRATORY SYNCYTIAL VIRUS A: NEGATIVE
RESPIRATORY SYNCYTIAL VIRUS B: NEGATIVE
Rhinovirus: NEGATIVE

## 2014-12-21 LAB — LEGIONELLA ANTIGEN, URINE

## 2014-12-25 LAB — CULTURE, BLOOD (ROUTINE X 2)
CULTURE: NO GROWTH
CULTURE: NO GROWTH

## 2014-12-26 NOTE — Progress Notes (Signed)
Body transferred to morgue along with death certificate.[facility] called. Patient placement made aware. Death check list complete.   Elmarie Shiley R

## 2014-12-26 NOTE — Discharge Summary (Signed)
Death Summary  [name] HALLENBECK Sr. WHQ:[id number] DOB: 1920/04/24 DOA: Dec 27, 2014  PCP: Scarlette Calico, MD  Admit date: 2014-12-27 Date of Death: Dec 29, 2014 Time of death: 0606  Cause of death:    HCAP (healthcare-associated pneumonia), likely aspiration pneumonia   Sepsis  Active Problems: dysphatia   Essential hypertension   Coronary atherosclerosis   Atrial fibrillation   Sleep apnea Recent right hip fracture   Hypothyroidism   Chronic combined systolic and diastolic heart failure   Acute renal failure superimposed on stage 4 chronic kidney disease   Gait disorder   Chronic atrial fibrillation   Thrombocytopenia Acute toxic encephalopathy  History of present illness:  81 y.o. male with past medical history of hypertension, GERD, hypothyroidism, coronary artery disease (post status of CABG), combined systolic and diastolic congestive heart failure, atrial fibrillation on Coumadin, chronic kidney disease-stage IV, stroke, history of skin cancer, recent right hip fracture (post status of repair 4/16), history of difficulty swallowing, who presents with cough and fever  Patient is accompanied by his daughter and wife. Per family, patient had right hip surgery on 4/16. Currently he is doing rehabilitation in nursing home. He has chronic problem of swallowing. Today, patient started having cough, SOB and fever. He coughs up foamy mucus. No chest pain. No runny nose, sore throat.   Hospital Course:  Admitted to stepdown. Started on broad spectrum antibiotics, oxygen, IVF.  Was very lethargic, so kept NPO.  Code status DNR on admission.  Discussed poor prognosis with family who requested comfort paramount. Patient was to transfer to comfort care if no improvement, but soon expired.  Discussed with family who were thankful for the care pt received.   Time: 45 min  Signed:  Annick Dimaio [name]  [facility] 2014-12-29, 8:05 AM

## 2014-12-26 NOTE — Care Management Note (Signed)
    Page 1 of 1   01-17-2015     8:20:48 AM CARE MANAGEMENT NOTE 2015-01-17  Patient:  STEVENS, MAGWOOD   Account Number:  [id number]  Date Initiated:  01/17/2015  Documentation initiated by:  Elissa Hefty  Subjective/Objective Assessment:   adm w pneumonia     Action/Plan:   has wife and da, was at snf for rehab pos hip surg, pcp dr Scarlette Calico   Anticipated DC Date:     Anticipated DC Plan:  [place] referral  Clinical Social Worker         Choice offered to / List presented to:             Status of service:   Medicare Important Message given?   (If response is "NO", the following Medicare IM given date fields will be blank) Date Medicare IM given:   Medicare IM given by:   Date Additional Medicare IM given:   Additional Medicare IM given by:    Discharge Disposition:    Per UR Regulation:  Reviewed for med. necessity/level of care/duration of stay  If discussed at [place] of Stay Meetings, dates discussed:    Comments:

## 2014-12-26 NOTE — Progress Notes (Signed)
   2014-12-25 0900  Clinical Encounter Type  Visited With Patient and family together  Visit Type Initial;Spiritual support;Social support;Death  Referral From Family  Spiritual Encounters  Spiritual Needs Prayer;Grief support  Stress Factors  Family Stress Factors Loss   Chaplain was paged to patient's room. Patient had recently passed away and family was requesting grief support. When chaplain arrived patient's wife, daughter, and son-in-law were at bedside. Patient's family shared several stories about the patient's life and seemed to be at peace that the patient passed away today. Patient's wife has been married to the patient for over 45 years. Patient's daughter was really sad and also had some worry about her mother.  Patient's family indicated that the patient was [place] baptist but that they are presbyterian. A close family friend arrived and patient's family asked for prayer. Chaplain prayed with patient's family. Patient's family said they had spoken to the nurse about funeral arrangements. At this point, family is still at bedside grieving. Patient's daughter is having a particularly hard time walking away. Chaplain let patient's family know that he is available for further grief support today. Please page chaplain if patient's family needs further grief support.  Fredonia Highland  Pager: [phone] 9:53 AM

## 2014-12-26 NOTE — Progress Notes (Addendum)
Pt's O2 sat dropped this am and RN progressed to NRB. He continued to desaturate even on 100% NRB. NP to bedside. Upon my arrival, pt unresponsive with shallow breathing. Appears comfortable. Called daughter and informed her of situation and that pt was actively dying. She will bring her mother (pt's wife) to hospital. Shortly after this phone call, [name] was without pulse or respirations. Pronounced by this NP and RN at 0606. Pt was DNR and death was expected. Death certificate completed. Will speak to family upon arrival. Clance Boll, NP [facility] Update: Family arrived around 0625. Support and condolences offered. Chaplain called for family. KJKG, NP [facility]

## 2014-12-26 NOTE — Progress Notes (Signed)
Pt has become unresponsive to verbal, tactile, and painful stimuli. Pt also began to desaturate and was put on a venturi mask followed by a nonrebreather.  Forrest Moron NP notified of current assessments. Vital signs stable at the moment. Told to continue to monitor and notify NP if pt demonstrates difficulty breathing, or status becomes worse. PRN ativan order modified to 0.5mg  Q4H PRN due to current sedation. Will continue to monitor.

## 2014-12-26 DEATH — deceased

## 2015-01-31 ENCOUNTER — Ambulatory Visit: Payer: Medicare Other | Admitting: Podiatry

## 2015-03-04 DIAGNOSIS — R55 Syncope and collapse: Secondary | ICD-10-CM | POA: Insufficient documentation

## 2016-04-30 IMAGING — DX DG PELVIS 1-2V
1 series · 1 of 1 positions shown · non-contrast
Comparison: None.

CLINICAL DATA: Fell today.  Right leg pain.

EXAM:
RIGHT FEMUR 2 VIEWS; PELVIS - 1-2 VIEW

[pelvis ap]
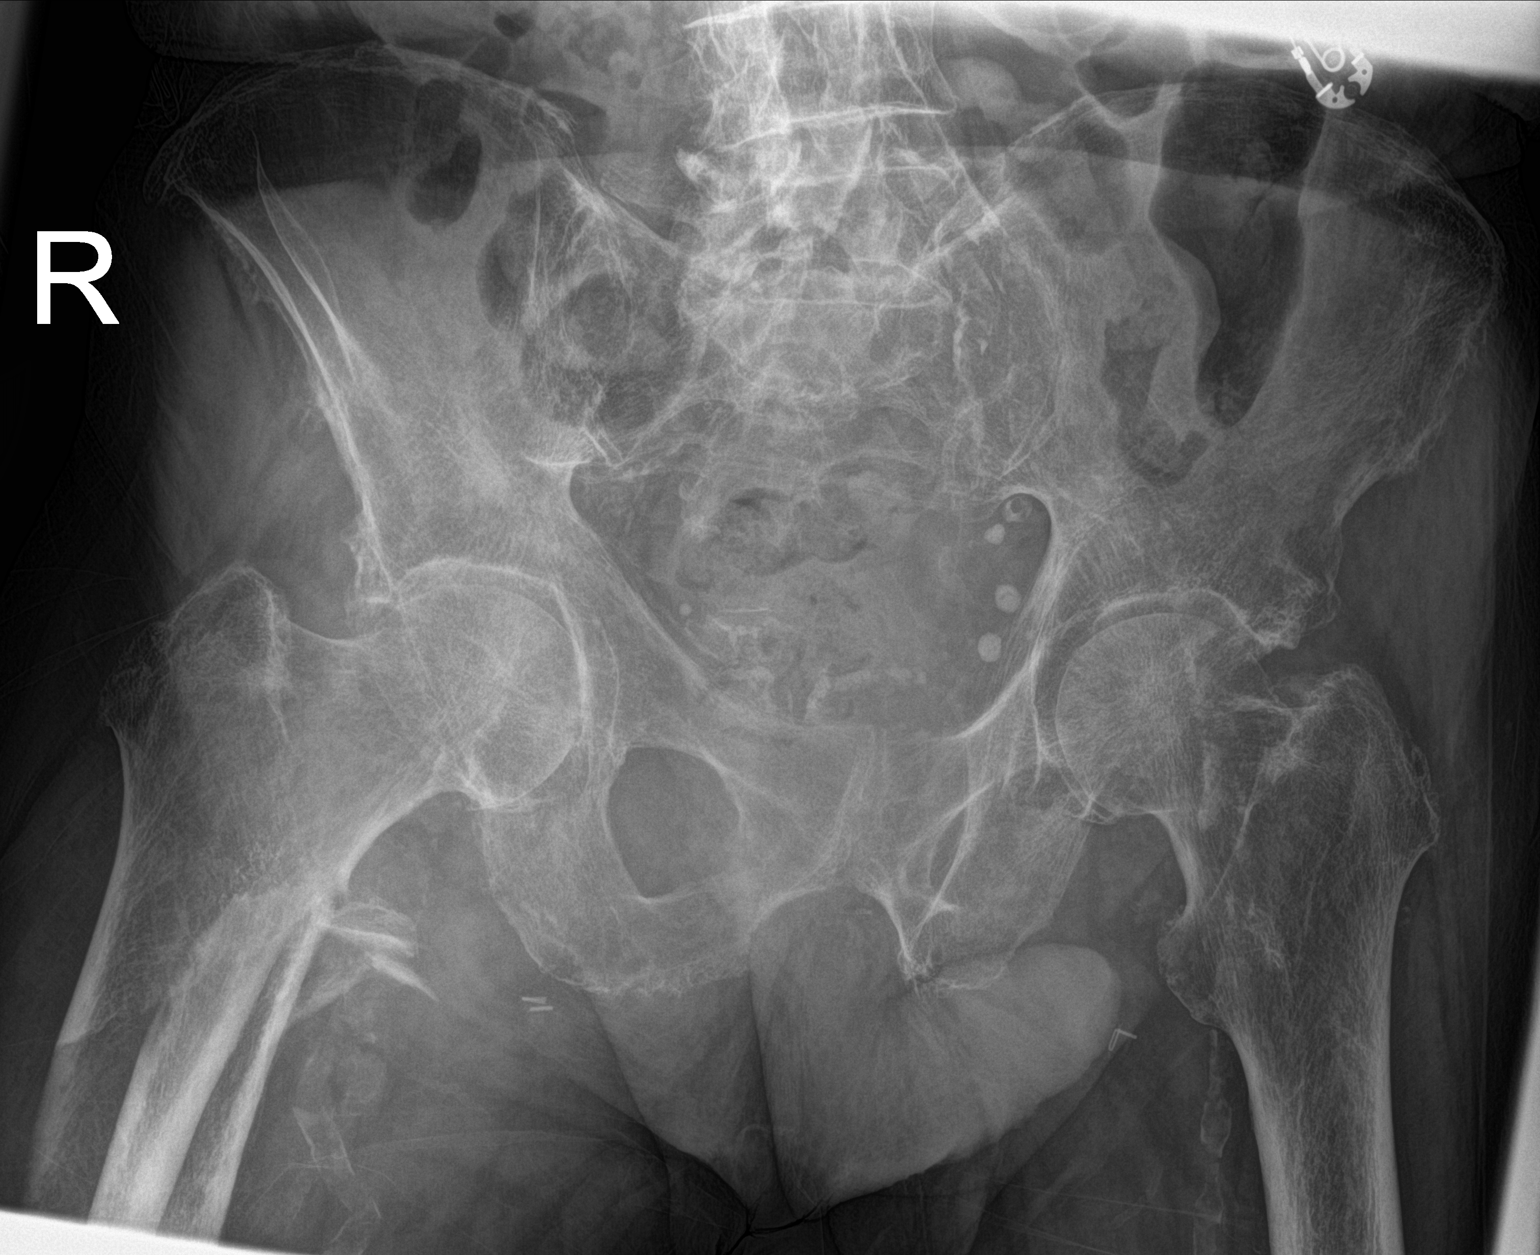

[1 of 1 positions shown; findings below may reference images not displayed]

FINDINGS: There is a comminuted displaced subtrochanteric fracture of the
femur. No definite pelvic fractures. Both hips are normally located.
IMPRESSION: Displaced subtrochanteric fracture of the right femur.

## 2016-04-30 IMAGING — CT CT HEAD W/O CM
3 of 5 series · 15 of 47 positions shown, 18 images · non-contrast
Comparison: 07/14/2014

CLINICAL DATA: Found on the floor after falling at home.

EXAM:
CT HEAD WITHOUT CONTRAST
CT CERVICAL SPINE WITHOUT CONTRAST
TECHNIQUE: Multidetector CT imaging of the head and cervical spine was
performed following the standard protocol without intravenous
contrast. Multiplanar CT image reconstructions of the cervical spine
were also generated.

[Series 7: coronals · coronal · 0.37mm/px · 3 of 78 slices shown]
[im 26/78  brain]
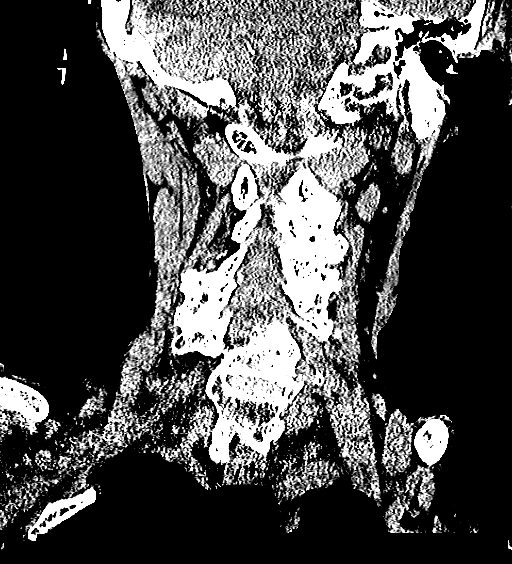
[im 35/78  brain]
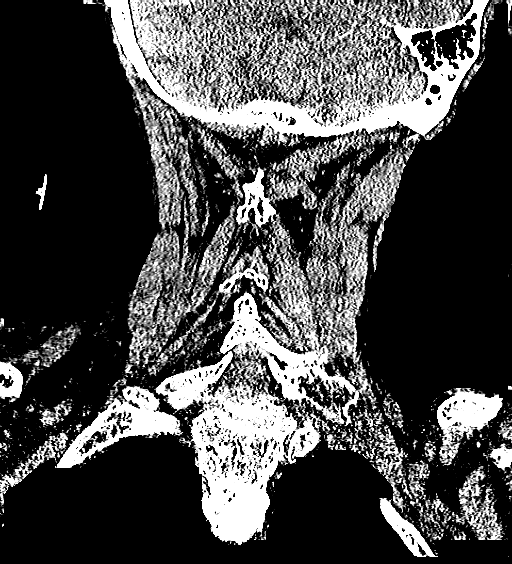
[im 43/78  brain]
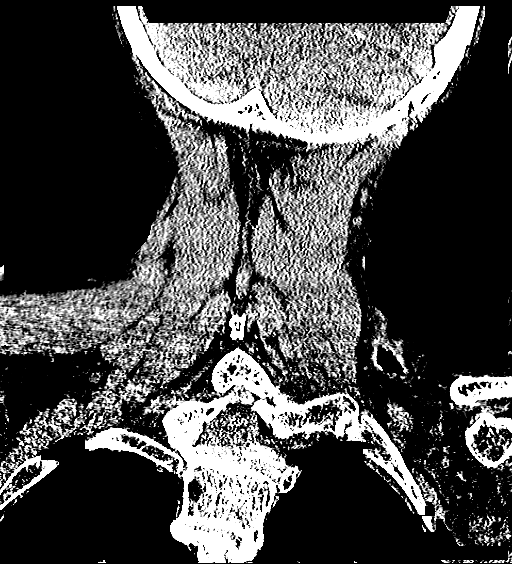

[Series 9: sagittals · sagittal · 0.36mm/px · 3 of 61 slices shown]
[im 21/61  brain]
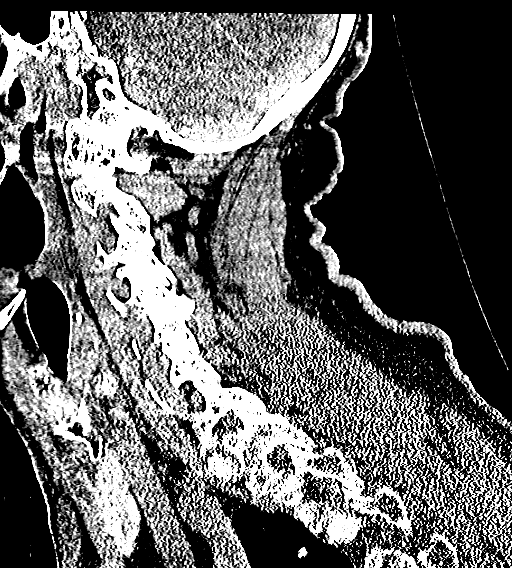
[im 31/61  brain]
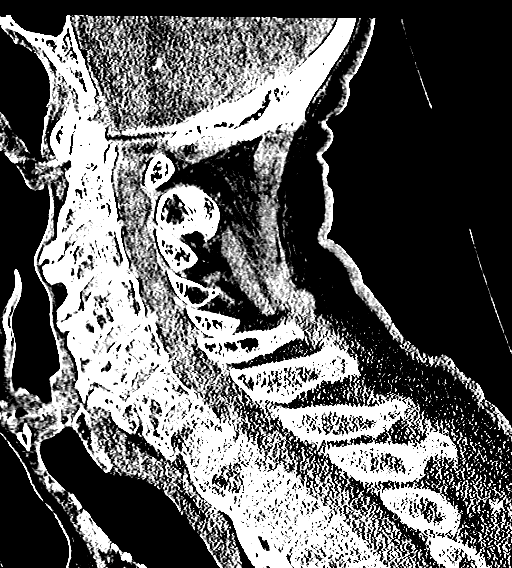
[im 41/61  brain]
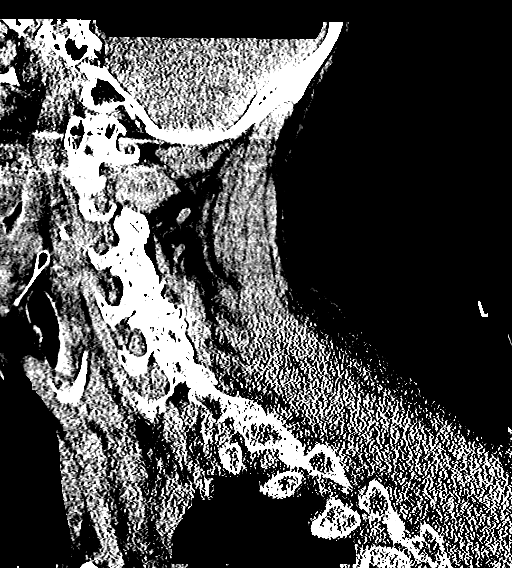

[Series 10: orthogonals · axial · 0.23mm/px · z∈[-364,-205]mm · 9 of 101 slices shown, 12 images]
[im 9/101  brain]
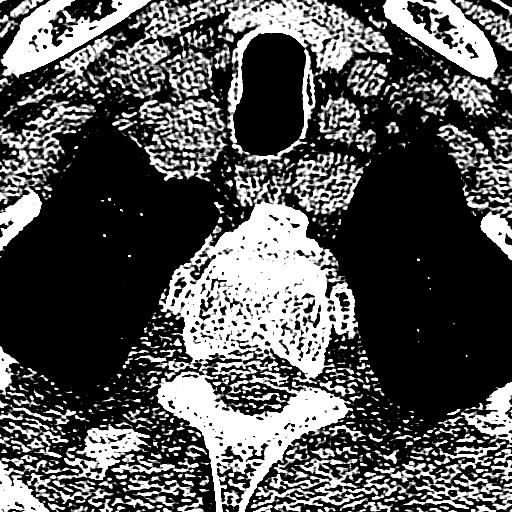
[im 9/101  bone]
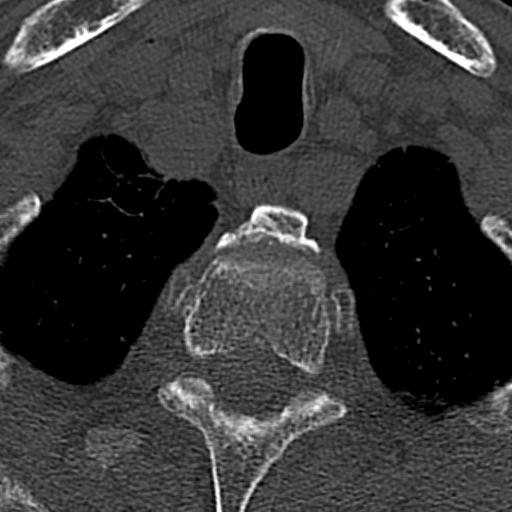
[im 17/101  brain]
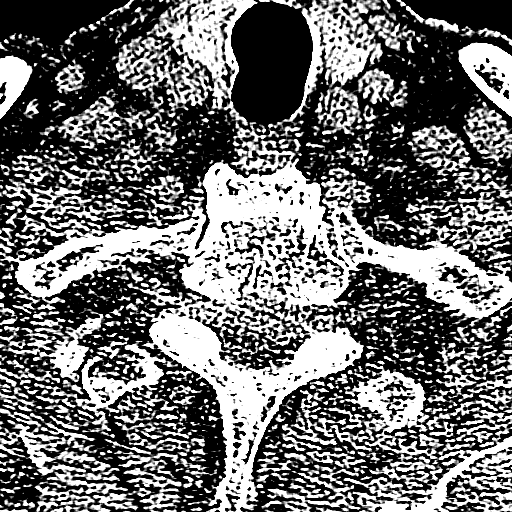
[im 34/101  brain]
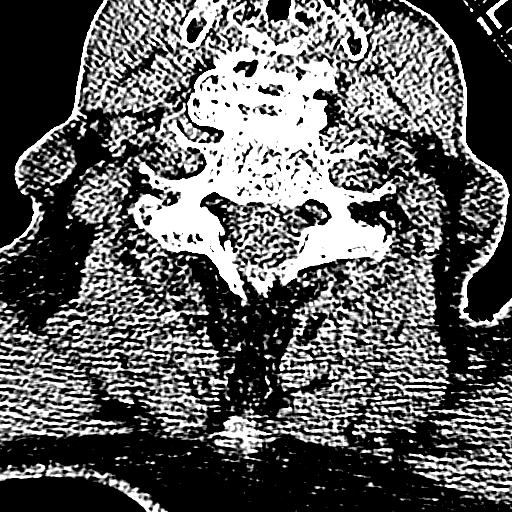
[im 42/101  brain]
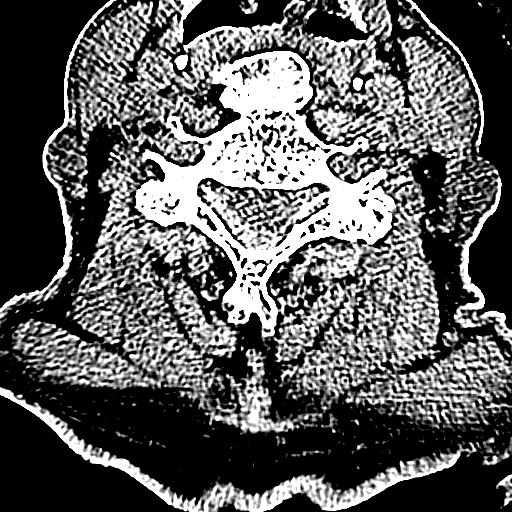
[im 51/101  brain]
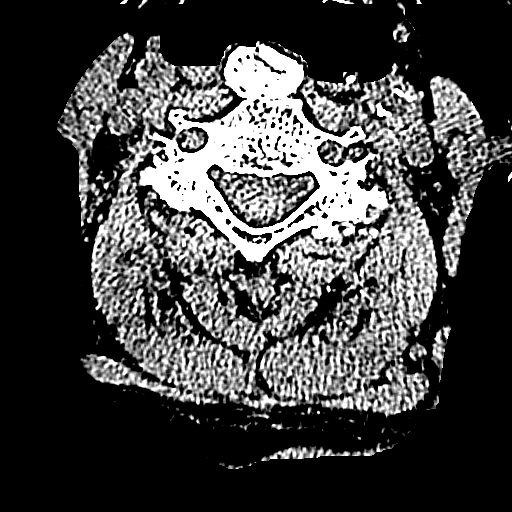
[im 51/101  bone]
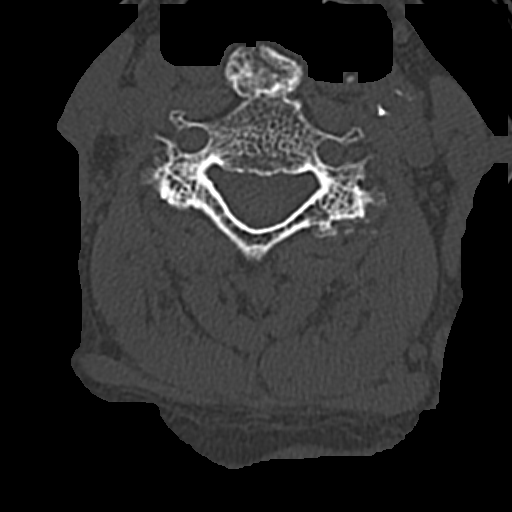
[im 59/101  brain]
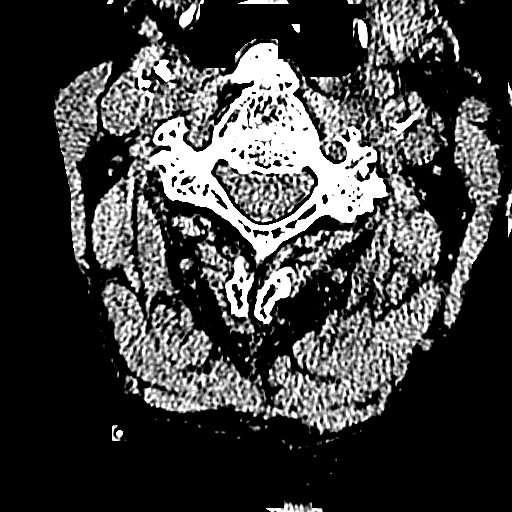
[im 67/101  brain]
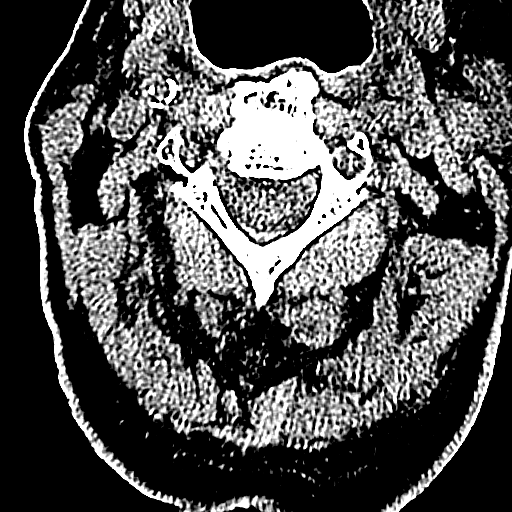
[im 84/101  brain]
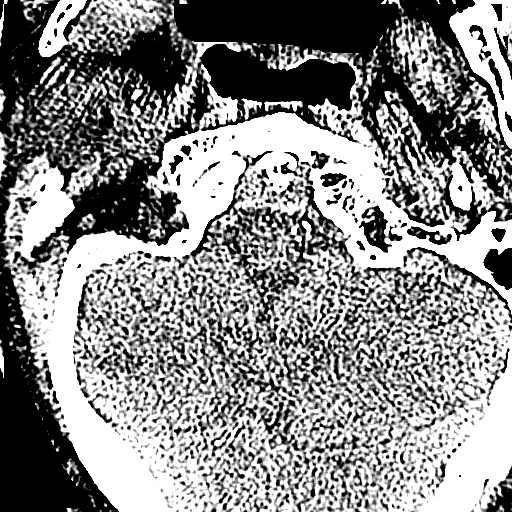
[im 92/101  brain]
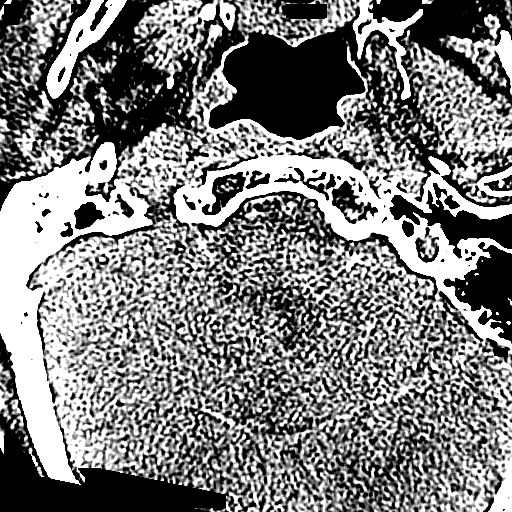
[im 92/101  bone]
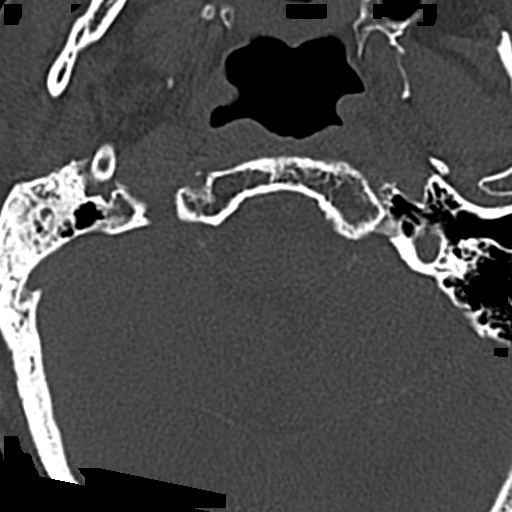

[15 of 47 positions shown; findings below may reference images not displayed]

FINDINGS: CT HEAD FINDINGS

There is generalized brain atrophy. There is chronic small vessel
disease throughout the deep white matter. There is old bifrontal
cortical and subcortical infarction, left worse than right. There is
chronic temporal and occipital atrophy with prominence of the
ventricles. No sign of acute infarction, mass lesion, hemorrhage,
obstructive hydrocephalus or extra-axial collection. No skull
fracture. No traumatic fluid in the sinuses, middle ears or
mastoids. Chronic sclerosis in the right mastoid region.

CT CERVICAL SPINE FINDINGS

No cervical fracture. No traumatic malalignment. No soft tissue
swelling.

There are prominent bridging osteophytes consistent with spondylosis
deformans. There is osteoarthritis of the C1-2 articulation. There
is ordinary facet osteoarthritis.
IMPRESSION: Head CT: Atrophy and chronic ischemic changes. No acute or traumatic
finding.

Cervical spine CT: Chronic spondylosis and ankylosis. No acute or
traumatic finding.

## 2016-04-30 IMAGING — CR DG CHEST 1V PORT
1 series · 1 of 1 positions shown · non-contrast
Comparison: 07/14/2014

CLINICAL DATA: Preop femur fracture repair.

EXAM:
PORTABLE CHEST - 1 VIEW

[AP]
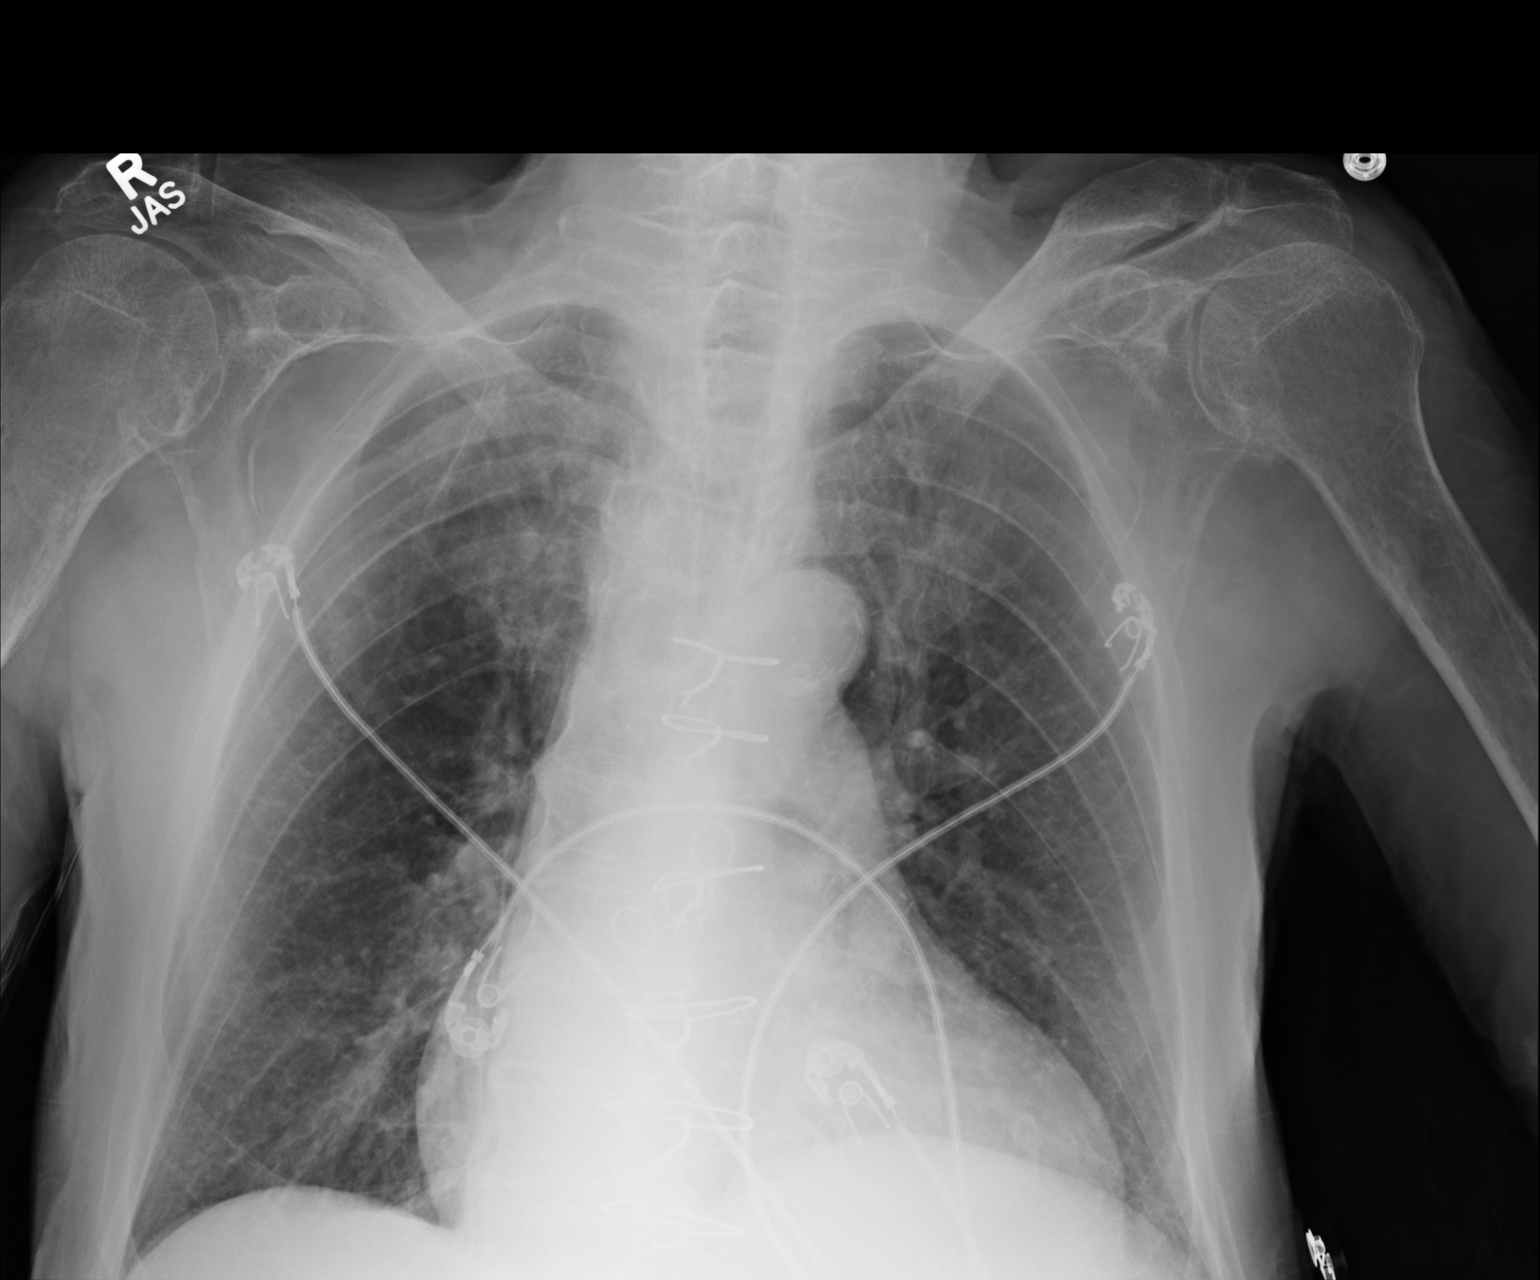

[1 of 1 positions shown; findings below may reference images not displayed]

FINDINGS: Patient is post median sternotomy. Cardiomediastinal contours are
unchanged with mild cardiomegaly, heart size likely accentuated by
technique. Lungs remain hyperinflated. No pulmonary edema,
consolidation, pleural effusion or pneumothorax. The bones are
diffusely under mineralized.
IMPRESSION: Stable chronic change without acute process.

## 2016-05-04 IMAGING — RF DG SWALLOWING FUNCTION - [facility]
1 series · 18 of 24 positions shown · non-contrast
Comparison: none

[Series 1: run · 16 acquisitions, 18 frames shown]
[im 1/16]
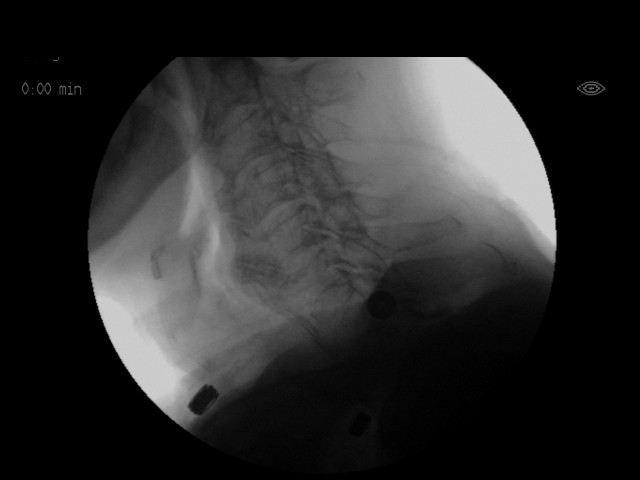
[im 2/16]
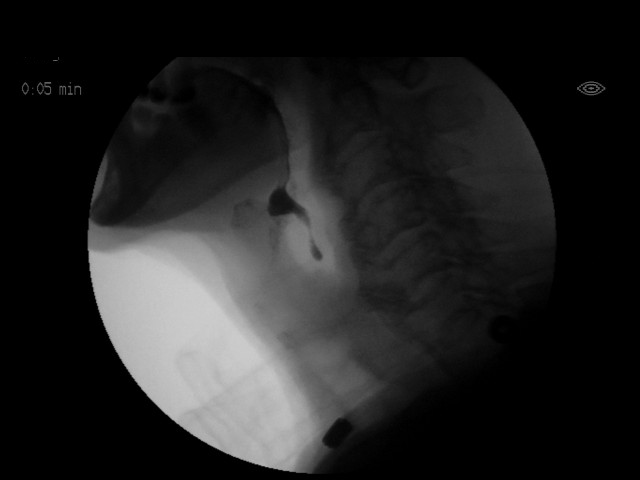
[im 3/16]
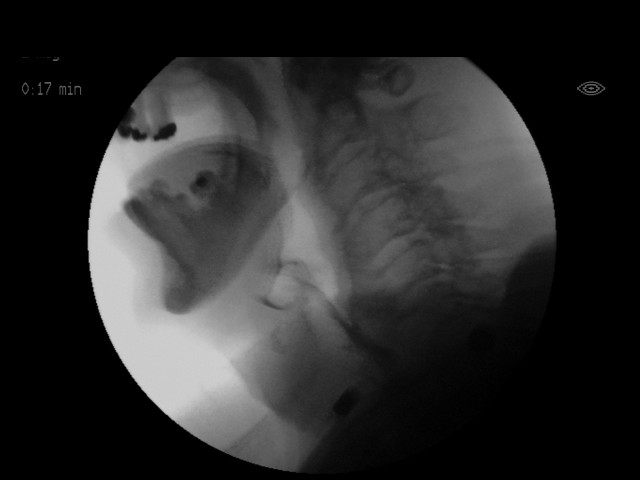
[im 3/16]
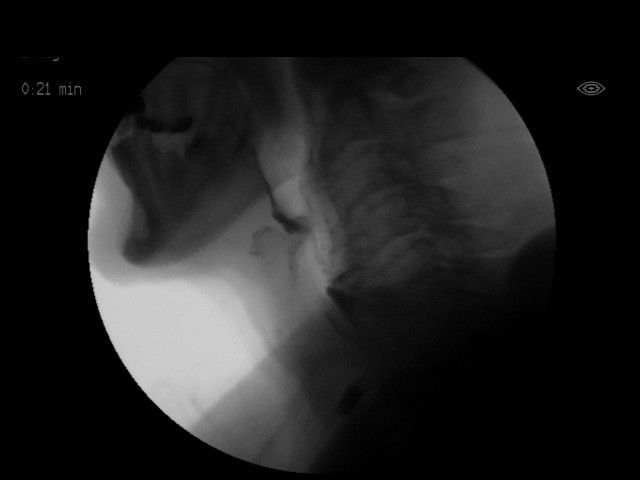
[im 5/16]
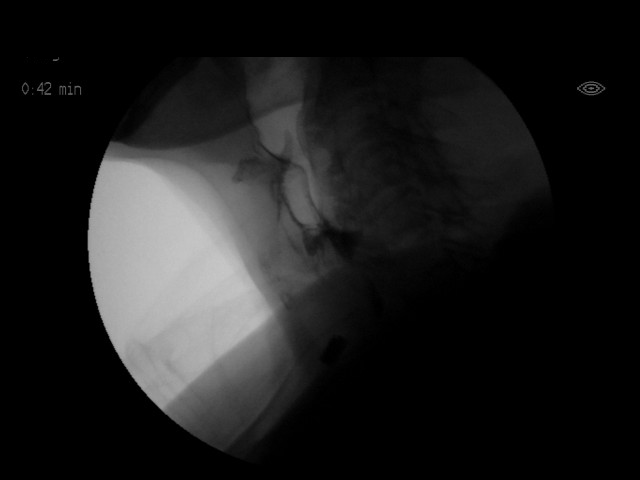
[im 5/16]
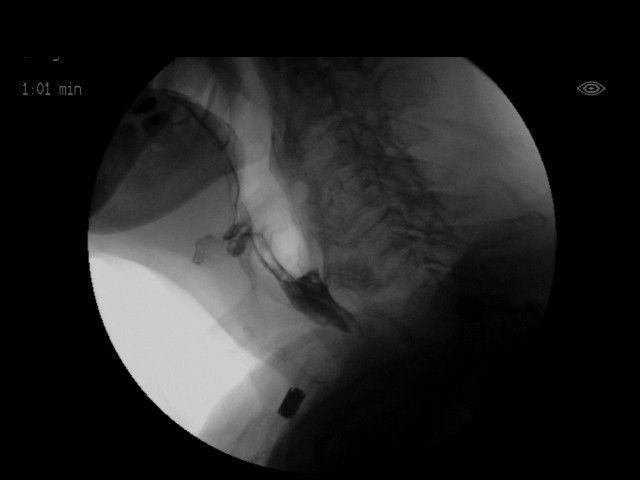
[im 6/16]
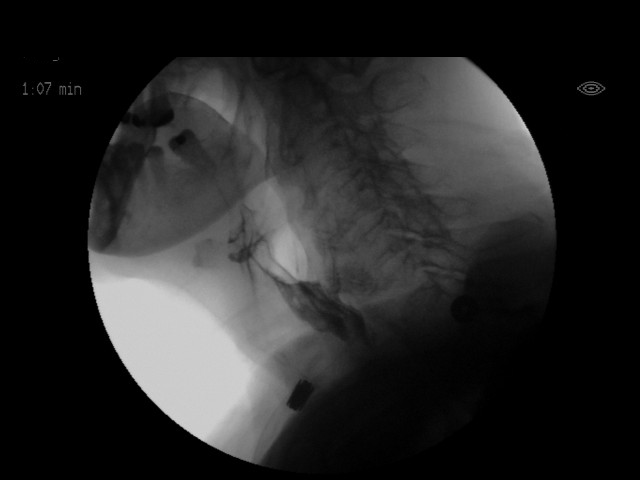
[im 7/16]
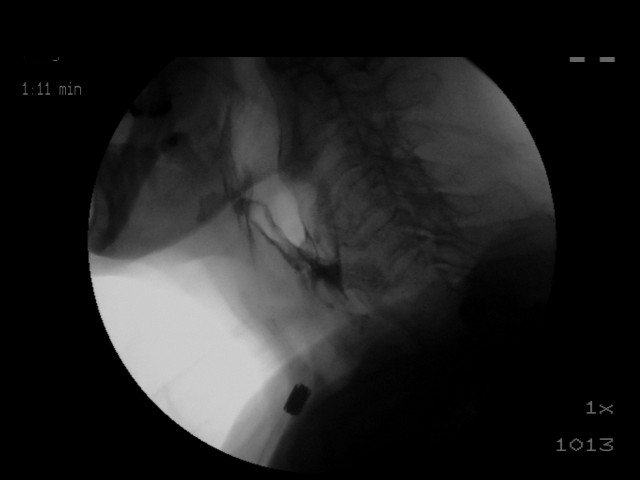
[im 8/16]
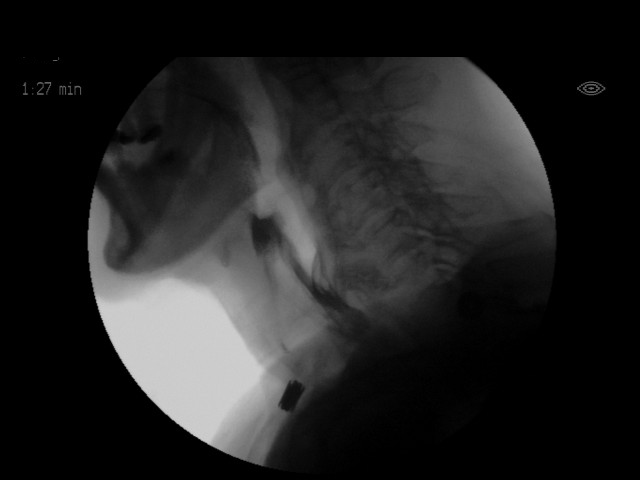
[im 9/16]
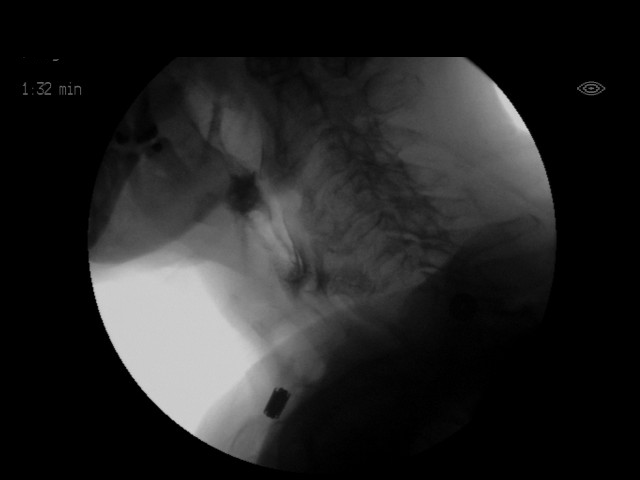
[im 10/16]
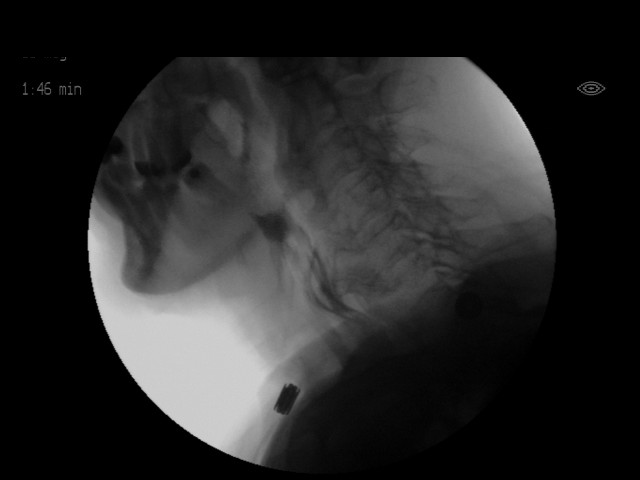
[im 11/16]
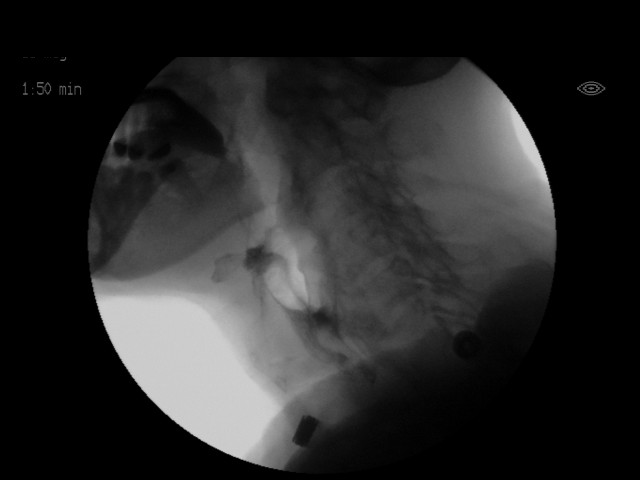
[im 12/16]
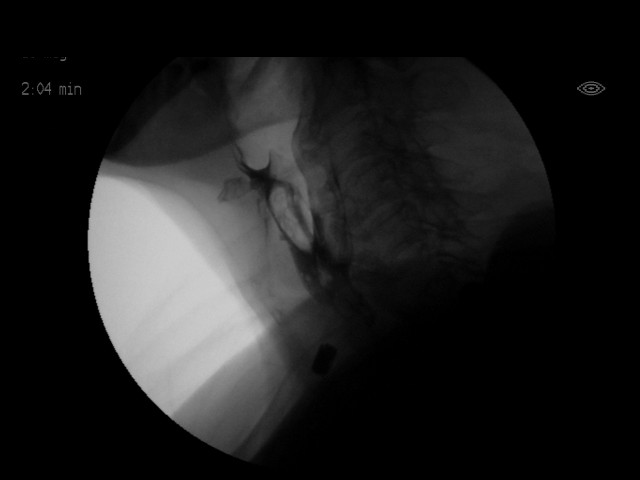
[im 13/16]
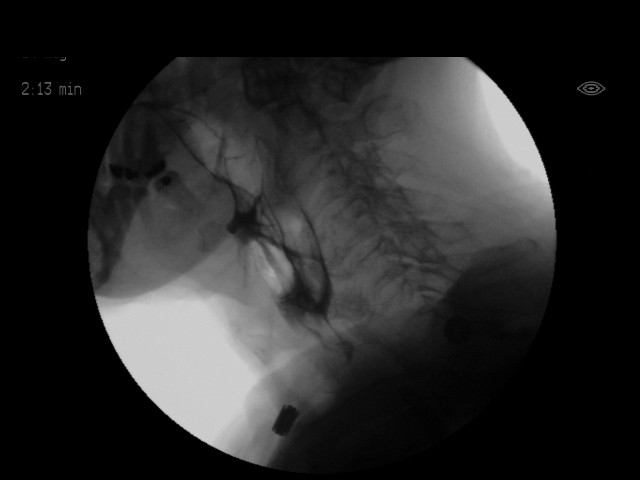
[im 14/16]
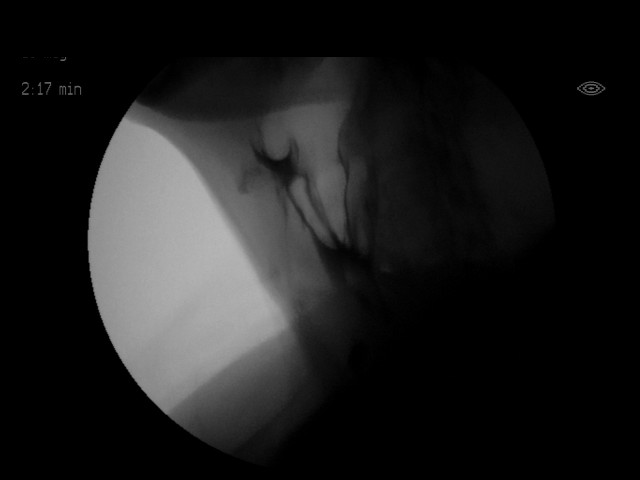
[im 14/16]
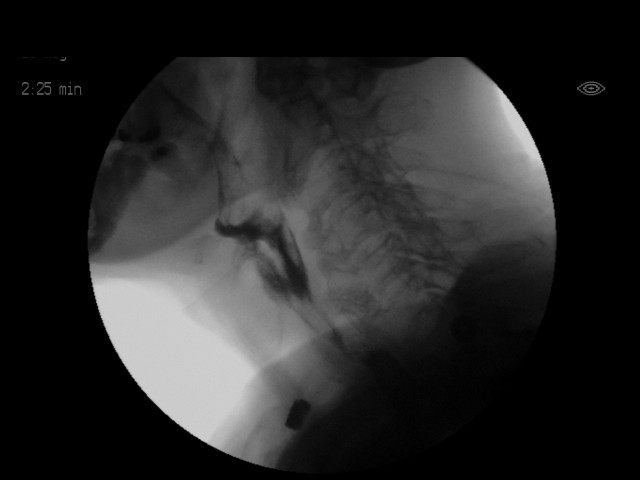
[im 16/16]
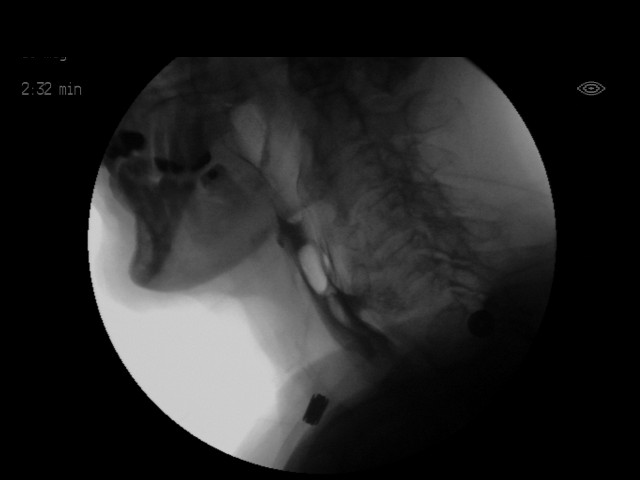
[im 16/16]
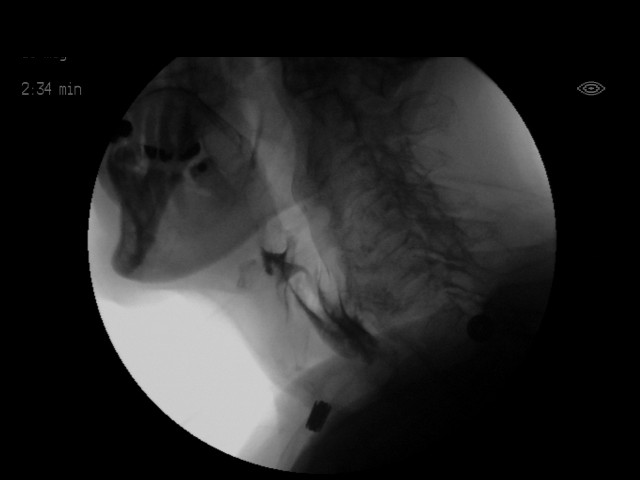

[18 of 24 positions shown; findings below may reference images not displayed]

Canned report from images found in remote index.

Refer to host system for actual result text.
